# Patient Record
Sex: Female | Born: 1950 | Race: Black or African American | Hispanic: No | Smoking: Former smoker
Health system: Southern US, Community
[De-identification: ages and names within clinical notes are randomized; demographics above are authoritative.]

## PROBLEM LIST (undated history)

## (undated) DIAGNOSIS — R600 Localized edema: Secondary | ICD-10-CM

## (undated) DIAGNOSIS — E119 Type 2 diabetes mellitus without complications: Secondary | ICD-10-CM

## (undated) DIAGNOSIS — M1990 Unspecified osteoarthritis, unspecified site: Secondary | ICD-10-CM

## (undated) DIAGNOSIS — E785 Hyperlipidemia, unspecified: Secondary | ICD-10-CM

## (undated) DIAGNOSIS — M5030 Other cervical disc degeneration, unspecified cervical region: Secondary | ICD-10-CM

## (undated) DIAGNOSIS — G4700 Insomnia, unspecified: Secondary | ICD-10-CM

## (undated) DIAGNOSIS — I890 Lymphedema, not elsewhere classified: Secondary | ICD-10-CM

## (undated) DIAGNOSIS — I5030 Unspecified diastolic (congestive) heart failure: Secondary | ICD-10-CM

## (undated) DIAGNOSIS — F32A Depression, unspecified: Secondary | ICD-10-CM

## (undated) DIAGNOSIS — Z9181 History of falling: Secondary | ICD-10-CM

## (undated) DIAGNOSIS — E559 Vitamin D deficiency, unspecified: Secondary | ICD-10-CM

## (undated) DIAGNOSIS — M2012 Hallux valgus (acquired), left foot: Secondary | ICD-10-CM

## (undated) DIAGNOSIS — Z961 Presence of intraocular lens: Secondary | ICD-10-CM

## (undated) DIAGNOSIS — M2011 Hallux valgus (acquired), right foot: Secondary | ICD-10-CM

## (undated) DIAGNOSIS — R0789 Other chest pain: Secondary | ICD-10-CM

## (undated) DIAGNOSIS — N186 End stage renal disease: Secondary | ICD-10-CM

## (undated) DIAGNOSIS — Z862 Personal history of diseases of the blood and blood-forming organs and certain disorders involving the immune mechanism: Secondary | ICD-10-CM

## (undated) DIAGNOSIS — R0609 Other forms of dyspnea: Secondary | ICD-10-CM

## (undated) DIAGNOSIS — N289 Disorder of kidney and ureter, unspecified: Secondary | ICD-10-CM

## (undated) DIAGNOSIS — N2581 Secondary hyperparathyroidism of renal origin: Secondary | ICD-10-CM

## (undated) DIAGNOSIS — Z7901 Long term (current) use of anticoagulants: Secondary | ICD-10-CM

## (undated) DIAGNOSIS — J189 Pneumonia, unspecified organism: Secondary | ICD-10-CM

## (undated) DIAGNOSIS — R760 Raised antibody titer: Secondary | ICD-10-CM

## (undated) DIAGNOSIS — D869 Sarcoidosis, unspecified: Secondary | ICD-10-CM

## (undated) DIAGNOSIS — G479 Sleep disorder, unspecified: Secondary | ICD-10-CM

## (undated) DIAGNOSIS — G5712 Meralgia paresthetica, left lower limb: Secondary | ICD-10-CM

## (undated) DIAGNOSIS — R531 Weakness: Secondary | ICD-10-CM

## (undated) DIAGNOSIS — I10 Essential (primary) hypertension: Secondary | ICD-10-CM

## (undated) DIAGNOSIS — D6851 Activated protein C resistance: Secondary | ICD-10-CM

## (undated) DIAGNOSIS — I69322 Dysarthria following cerebral infarction: Secondary | ICD-10-CM

## (undated) DIAGNOSIS — D649 Anemia, unspecified: Secondary | ICD-10-CM

## (undated) DIAGNOSIS — G4733 Obstructive sleep apnea (adult) (pediatric): Secondary | ICD-10-CM

## (undated) DIAGNOSIS — K219 Gastro-esophageal reflux disease without esophagitis: Secondary | ICD-10-CM

## (undated) DIAGNOSIS — N189 Chronic kidney disease, unspecified: Secondary | ICD-10-CM

## (undated) DIAGNOSIS — D631 Anemia in chronic kidney disease: Secondary | ICD-10-CM

## (undated) DIAGNOSIS — I639 Cerebral infarction, unspecified: Secondary | ICD-10-CM

## (undated) DIAGNOSIS — Z794 Long term (current) use of insulin: Secondary | ICD-10-CM

## (undated) DIAGNOSIS — Z992 Dependence on renal dialysis: Secondary | ICD-10-CM

## (undated) HISTORY — PX: FRACTURE SURGERY: SHX138

## (undated) HISTORY — DX: Sleep disorder, unspecified: G47.9

## (undated) HISTORY — DX: Raised antibody titer: R76.0

## (undated) HISTORY — DX: Activated protein C resistance: D68.51

## (undated) HISTORY — DX: Anemia, unspecified: D64.9

---

## 2008-08-30 DIAGNOSIS — Z9889 Other specified postprocedural states: Secondary | ICD-10-CM

## 2008-08-30 HISTORY — DX: Other specified postprocedural states: Z98.890

## 2011-02-27 DIAGNOSIS — I639 Cerebral infarction, unspecified: Secondary | ICD-10-CM

## 2011-02-27 HISTORY — DX: Cerebral infarction, unspecified: I63.9

## 2012-01-31 DIAGNOSIS — D869 Sarcoidosis, unspecified: Secondary | ICD-10-CM | POA: Insufficient documentation

## 2012-10-13 DIAGNOSIS — E119 Type 2 diabetes mellitus without complications: Secondary | ICD-10-CM | POA: Insufficient documentation

## 2014-11-21 DIAGNOSIS — E559 Vitamin D deficiency, unspecified: Secondary | ICD-10-CM | POA: Insufficient documentation

## 2014-12-10 DIAGNOSIS — E1159 Type 2 diabetes mellitus with other circulatory complications: Secondary | ICD-10-CM | POA: Insufficient documentation

## 2014-12-10 DIAGNOSIS — I152 Hypertension secondary to endocrine disorders: Secondary | ICD-10-CM | POA: Insufficient documentation

## 2014-12-10 DIAGNOSIS — Z9181 History of falling: Secondary | ICD-10-CM | POA: Insufficient documentation

## 2014-12-10 DIAGNOSIS — I10 Essential (primary) hypertension: Secondary | ICD-10-CM | POA: Insufficient documentation

## 2015-07-08 DIAGNOSIS — I890 Lymphedema, not elsewhere classified: Secondary | ICD-10-CM | POA: Insufficient documentation

## 2015-07-08 DIAGNOSIS — R600 Localized edema: Secondary | ICD-10-CM | POA: Insufficient documentation

## 2015-07-08 DIAGNOSIS — B351 Tinea unguium: Secondary | ICD-10-CM | POA: Insufficient documentation

## 2015-07-08 DIAGNOSIS — M2010 Hallux valgus (acquired), unspecified foot: Secondary | ICD-10-CM | POA: Insufficient documentation

## 2015-08-26 DIAGNOSIS — Z961 Presence of intraocular lens: Secondary | ICD-10-CM | POA: Insufficient documentation

## 2015-10-07 DIAGNOSIS — Z961 Presence of intraocular lens: Secondary | ICD-10-CM | POA: Insufficient documentation

## 2015-10-21 DIAGNOSIS — K8020 Calculus of gallbladder without cholecystitis without obstruction: Secondary | ICD-10-CM | POA: Insufficient documentation

## 2016-01-27 DIAGNOSIS — K219 Gastro-esophageal reflux disease without esophagitis: Secondary | ICD-10-CM | POA: Insufficient documentation

## 2016-03-12 DIAGNOSIS — R0609 Other forms of dyspnea: Secondary | ICD-10-CM | POA: Insufficient documentation

## 2016-05-04 DIAGNOSIS — Z8709 Personal history of other diseases of the respiratory system: Secondary | ICD-10-CM | POA: Insufficient documentation

## 2016-05-21 DIAGNOSIS — M170 Bilateral primary osteoarthritis of knee: Secondary | ICD-10-CM | POA: Insufficient documentation

## 2016-10-20 DIAGNOSIS — I6930 Unspecified sequelae of cerebral infarction: Secondary | ICD-10-CM | POA: Insufficient documentation

## 2016-10-20 DIAGNOSIS — E1169 Type 2 diabetes mellitus with other specified complication: Secondary | ICD-10-CM | POA: Insufficient documentation

## 2016-10-20 DIAGNOSIS — I5030 Unspecified diastolic (congestive) heart failure: Secondary | ICD-10-CM | POA: Insufficient documentation

## 2016-10-20 DIAGNOSIS — E119 Type 2 diabetes mellitus without complications: Secondary | ICD-10-CM | POA: Insufficient documentation

## 2017-07-14 DIAGNOSIS — E785 Hyperlipidemia, unspecified: Secondary | ICD-10-CM | POA: Insufficient documentation

## 2018-03-22 DIAGNOSIS — N183 Chronic kidney disease, stage 3 unspecified: Secondary | ICD-10-CM | POA: Insufficient documentation

## 2018-03-22 DIAGNOSIS — S82141D Displaced bicondylar fracture of right tibia, subsequent encounter for closed fracture with routine healing: Secondary | ICD-10-CM | POA: Insufficient documentation

## 2018-03-22 DIAGNOSIS — S42202A Unspecified fracture of upper end of left humerus, initial encounter for closed fracture: Secondary | ICD-10-CM | POA: Insufficient documentation

## 2018-05-11 DIAGNOSIS — Z4889 Encounter for other specified surgical aftercare: Secondary | ICD-10-CM | POA: Insufficient documentation

## 2018-05-11 DIAGNOSIS — B964 Proteus (mirabilis) (morganii) as the cause of diseases classified elsewhere: Secondary | ICD-10-CM | POA: Insufficient documentation

## 2018-06-29 DIAGNOSIS — N189 Chronic kidney disease, unspecified: Secondary | ICD-10-CM | POA: Insufficient documentation

## 2018-07-02 DIAGNOSIS — N179 Acute kidney failure, unspecified: Secondary | ICD-10-CM | POA: Insufficient documentation

## 2018-07-18 DIAGNOSIS — Z8669 Personal history of other diseases of the nervous system and sense organs: Secondary | ICD-10-CM | POA: Insufficient documentation

## 2018-07-18 DIAGNOSIS — I639 Cerebral infarction, unspecified: Secondary | ICD-10-CM | POA: Insufficient documentation

## 2018-07-18 DIAGNOSIS — D6851 Activated protein C resistance: Secondary | ICD-10-CM

## 2018-07-18 DIAGNOSIS — R760 Raised antibody titer: Secondary | ICD-10-CM

## 2018-07-18 DIAGNOSIS — R7983 Abnormal findings of blood amino-acid level: Secondary | ICD-10-CM

## 2018-07-18 HISTORY — DX: Raised antibody titer: R76.0

## 2018-07-18 HISTORY — DX: Activated protein C resistance: D68.51

## 2018-07-18 HISTORY — DX: Abnormal findings of blood amino-acid level: R79.83

## 2018-08-16 DIAGNOSIS — D6851 Activated protein C resistance: Secondary | ICD-10-CM | POA: Insufficient documentation

## 2018-08-16 DIAGNOSIS — R760 Raised antibody titer: Secondary | ICD-10-CM | POA: Insufficient documentation

## 2018-08-16 DIAGNOSIS — R7989 Other specified abnormal findings of blood chemistry: Secondary | ICD-10-CM | POA: Insufficient documentation

## 2018-09-19 DIAGNOSIS — A419 Sepsis, unspecified organism: Secondary | ICD-10-CM | POA: Insufficient documentation

## 2018-09-19 DIAGNOSIS — D649 Anemia, unspecified: Secondary | ICD-10-CM | POA: Insufficient documentation

## 2018-09-19 DIAGNOSIS — D638 Anemia in other chronic diseases classified elsewhere: Secondary | ICD-10-CM | POA: Insufficient documentation

## 2018-09-19 DIAGNOSIS — L03116 Cellulitis of left lower limb: Secondary | ICD-10-CM | POA: Insufficient documentation

## 2019-09-10 DIAGNOSIS — R0789 Other chest pain: Secondary | ICD-10-CM | POA: Insufficient documentation

## 2020-01-04 DIAGNOSIS — I890 Lymphedema, not elsewhere classified: Secondary | ICD-10-CM | POA: Insufficient documentation

## 2020-01-04 DIAGNOSIS — L97921 Non-pressure chronic ulcer of unspecified part of left lower leg limited to breakdown of skin: Secondary | ICD-10-CM | POA: Insufficient documentation

## 2020-01-04 DIAGNOSIS — L97421 Non-pressure chronic ulcer of left heel and midfoot limited to breakdown of skin: Secondary | ICD-10-CM | POA: Insufficient documentation

## 2020-06-11 DIAGNOSIS — N390 Urinary tract infection, site not specified: Secondary | ICD-10-CM | POA: Insufficient documentation

## 2020-06-11 DIAGNOSIS — Z8673 Personal history of transient ischemic attack (TIA), and cerebral infarction without residual deficits: Secondary | ICD-10-CM | POA: Insufficient documentation

## 2020-06-11 DIAGNOSIS — E119 Type 2 diabetes mellitus without complications: Secondary | ICD-10-CM | POA: Insufficient documentation

## 2020-06-11 DIAGNOSIS — E1165 Type 2 diabetes mellitus with hyperglycemia: Secondary | ICD-10-CM | POA: Insufficient documentation

## 2020-09-25 DIAGNOSIS — Z1624 Resistance to multiple antibiotics: Secondary | ICD-10-CM | POA: Insufficient documentation

## 2020-10-02 DIAGNOSIS — M6281 Muscle weakness (generalized): Secondary | ICD-10-CM | POA: Insufficient documentation

## 2020-10-02 DIAGNOSIS — D6851 Activated protein C resistance: Secondary | ICD-10-CM | POA: Insufficient documentation

## 2020-10-02 DIAGNOSIS — G609 Hereditary and idiopathic neuropathy, unspecified: Secondary | ICD-10-CM | POA: Insufficient documentation

## 2020-10-02 DIAGNOSIS — R531 Weakness: Secondary | ICD-10-CM | POA: Insufficient documentation

## 2020-10-02 DIAGNOSIS — R269 Unspecified abnormalities of gait and mobility: Secondary | ICD-10-CM | POA: Insufficient documentation

## 2020-10-02 DIAGNOSIS — Z1624 Resistance to multiple antibiotics: Secondary | ICD-10-CM | POA: Insufficient documentation

## 2020-10-02 DIAGNOSIS — Z9181 History of falling: Secondary | ICD-10-CM | POA: Insufficient documentation

## 2020-10-02 DIAGNOSIS — R279 Unspecified lack of coordination: Secondary | ICD-10-CM | POA: Insufficient documentation

## 2020-10-02 DIAGNOSIS — G4700 Insomnia, unspecified: Secondary | ICD-10-CM | POA: Insufficient documentation

## 2020-10-02 DIAGNOSIS — D631 Anemia in chronic kidney disease: Secondary | ICD-10-CM | POA: Insufficient documentation

## 2020-10-02 DIAGNOSIS — N189 Chronic kidney disease, unspecified: Secondary | ICD-10-CM | POA: Insufficient documentation

## 2021-01-31 DIAGNOSIS — I5030 Unspecified diastolic (congestive) heart failure: Secondary | ICD-10-CM | POA: Insufficient documentation

## 2021-02-27 ENCOUNTER — Other Ambulatory Visit: Payer: Self-pay

## 2021-02-27 ENCOUNTER — Emergency Department: Payer: Medicare Other

## 2021-02-27 ENCOUNTER — Emergency Department
Admission: EM | Admit: 2021-02-27 | Discharge: 2021-02-27 | Disposition: A | Discharge status: Home / Self Care | Payer: Medicare Other | Attending: Emergency Medicine | Admitting: Emergency Medicine

## 2021-02-27 DIAGNOSIS — E119 Type 2 diabetes mellitus without complications: Secondary | ICD-10-CM | POA: Insufficient documentation

## 2021-02-27 DIAGNOSIS — I10 Essential (primary) hypertension: Secondary | ICD-10-CM | POA: Diagnosis not present

## 2021-02-27 DIAGNOSIS — Z7901 Long term (current) use of anticoagulants: Secondary | ICD-10-CM | POA: Insufficient documentation

## 2021-02-27 DIAGNOSIS — Z79899 Other long term (current) drug therapy: Secondary | ICD-10-CM | POA: Diagnosis not present

## 2021-02-27 DIAGNOSIS — Z794 Long term (current) use of insulin: Secondary | ICD-10-CM | POA: Diagnosis not present

## 2021-02-27 DIAGNOSIS — R0789 Other chest pain: Secondary | ICD-10-CM | POA: Diagnosis present

## 2021-02-27 HISTORY — DX: Disorder of kidney and ureter, unspecified: N28.9

## 2021-02-27 HISTORY — DX: Type 2 diabetes mellitus without complications: E11.9

## 2021-02-27 HISTORY — DX: Essential (primary) hypertension: I10

## 2021-02-27 LAB — BASIC METABOLIC PANEL
Anion gap: 8 (ref 5–15)
BUN: 60 mg/dL — ABNORMAL HIGH (ref 8–23)
CO2: 19 mmol/L — ABNORMAL LOW (ref 22–32)
Calcium: 8.4 mg/dL — ABNORMAL LOW (ref 8.9–10.3)
Chloride: 113 mmol/L — ABNORMAL HIGH (ref 98–111)
Creatinine, Ser: 2.97 mg/dL — ABNORMAL HIGH (ref 0.44–1.00)
GFR, Estimated: 17 mL/min — ABNORMAL LOW (ref >60)
Glucose, Bld: 101 mg/dL — ABNORMAL HIGH (ref 70–99)
Potassium: 5.1 mmol/L (ref 3.5–5.1)
Sodium: 140 mmol/L (ref 135–145)

## 2021-02-27 LAB — CBC
HCT: 26.4 % — ABNORMAL LOW (ref 36.0–46.0)
Hemoglobin: 8.6 g/dL — ABNORMAL LOW (ref 12.0–15.0)
MCH: 31 pg (ref 26.0–34.0)
MCHC: 32.6 g/dL (ref 30.0–36.0)
MCV: 95.3 fL (ref 80.0–100.0)
Platelets: 177 10*3/uL (ref 150–400)
RBC: 2.77 MIL/uL — ABNORMAL LOW (ref 3.87–5.11)
RDW: 13.6 % (ref 11.5–15.5)
WBC: 5.7 10*3/uL (ref 4.0–10.5)
nRBC: 0 % (ref 0.0–0.2)

## 2021-02-27 LAB — TROPONIN I (HIGH SENSITIVITY)
Troponin I (High Sensitivity): 5 ng/L (ref <18)
Troponin I (High Sensitivity): 6 ng/L (ref <18)

## 2021-02-27 MED ORDER — ALUM & MAG HYDROXIDE-SIMETH 200-200-20 MG/5ML PO SUSP
30.0000 mL | Freq: Once | ORAL | Status: AC
  Administered 2021-02-27: 30 mL via ORAL
  Filled 2021-02-27: qty 30

## 2021-02-27 MED ORDER — LIDOCAINE VISCOUS HCL 2 % MT SOLN
15.0000 mL | Freq: Once | OROMUCOSAL | Status: AC
  Administered 2021-02-27: 15 mL via ORAL
  Filled 2021-02-27: qty 15

## 2021-02-27 NOTE — ED Triage Notes (Signed)
Pt to ER via [facility] from [facility] with complaints of midsternal chest pain that radiates into back, started approx 1hr ago when laying in bed. Denies SHOB. Denies cardiac history.  Ems reports patient received x2 sublingual nitro with no relief. +324 aspirin.  EMS VSS-12 lead EKG NSR. BP 189/86, RA sats 97%. HR 76.

## 2021-02-27 NOTE — ED Notes (Signed)
ED Provider at bedside. 

## 2021-02-27 NOTE — ED Notes (Signed)
RN attempted to call report to [facility]. Was transferred to multiple people and then got disconnected./

## 2021-02-27 NOTE — ED Notes (Signed)
Offgoing RN called lab to recollect trop for pt due to hard blood draw

## 2021-02-27 NOTE — ED Provider Notes (Signed)
[facility] Emergency Department Provider Note   ____________________________________________    I have reviewed the triage vital signs and the nursing notes.   HISTORY  Chief Complaint Chest Pain     HPI Jody Nelson is a 70 y.o. female with history of diabetes and high blood pressure who presents with complaints of chest pain.  Patient describes left anterior chest pain beneath her breast, denies radiation to me. started approximately 1 hour prior to arrival.  No history of MI.  No difficulty breathing.  No fevers chills or cough.  No nausea or vomiting or diaphoresis.  Received aspirin and nitroglycerin from EMS with no improvement  Past Medical History:  Diagnosis Date   Diabetes mellitus without complication ([place])    Hypertension    Renal disorder     There are no problems to display for this patient.   History reviewed. No pertinent surgical history.  Prior to Admission medications   Medication Sig Start Date End Date Taking? Authorizing Provider  acetaminophen (TYLENOL) 325 MG tablet Take 2 tablets by mouth every 8 (eight) hours as needed.   Yes [provider]  apixaban (ELIQUIS) 5 MG TABS tablet Take 1 tablet by mouth every 12 (twelve) hours. 08/16/18  Yes [provider]  atorvastatin (LIPITOR) 80 MG tablet Take 80 mg by mouth daily. 02/19/21  Yes [provider]  Calcium Carb-Cholecalciferol (OYSTER SHELL CALCIUM W/D) 500-5 MG-MCG TABS Take 1 tablet by mouth daily as needed. 02/19/21  Yes [provider]  carvedilol (COREG) 25 MG tablet Take 1 tablet by mouth 2 (two) times daily. 02/19/21  Yes [provider]  cephALEXin (KEFLEX) 500 MG capsule Take 1 capsule by mouth every 12 (twelve) hours. 02/17/21 03/19/21 Yes [provider]  doxycycline (MONODOX) 100 MG capsule Take 1 capsule by mouth every 12 (twelve) hours. 02/18/21  Yes [provider]  ferrous sulfate 324 (65 Fe) MG  TBEC Take 1 tablet by mouth daily. 02/19/21  Yes [provider]  folic acid (FOLVITE) 1 MG tablet Take 1 mg by mouth daily. 02/19/21  Yes [provider]  furosemide (LASIX) 40 MG tablet Take 40 mg by mouth daily. 02/19/21  Yes [provider]  gabapentin (NEURONTIN) 100 MG capsule Take 2 capsules by mouth 2 (two) times daily. 02/19/21  Yes [provider]  insulin glargine (LANTUS) 100 UNIT/ML Solostar Pen Inject 10 Units into the skin at bedtime. 02/17/21  Yes [provider]  insulin lispro (HUMALOG) 100 UNIT/ML injection Inject 2-12 Units into the skin in the morning, at noon, and at bedtime. Per sliding scale   Yes [provider]  melatonin 3 MG TABS tablet Take 2 tablets by mouth at bedtime. 03/16/18  Yes [provider]  montelukast (SINGULAIR) 10 MG tablet Take 10 mg by mouth daily. 02/19/21  Yes [provider]  traZODone (DESYREL) 50 MG tablet Take 1 tablet by mouth at bedtime as needed. 08/22/20  Yes [provider]     Allergies Sulfamethoxazole-trimethoprim  No family history on file.  Social History    Review of Systems  Constitutional: No fever/chills Eyes: No visual changes.  ENT: No sore throat. Cardiovascular: Denies chest pain. Respiratory: Denies shortness of breath. Gastrointestinal: No abdominal pain.  No nausea, no vomiting.   Genitourinary: Negative for dysuria. Musculoskeletal: Negative for back pain. Skin: Negative for rash. Neurological: Negative for headaches or weakness   ____________________________________________   PHYSICAL EXAM:  VITAL SIGNS: ED Triage Vitals  Enc Vitals Group     BP 02/27/21 0819 (!) 163/83     Pulse Rate 02/27/21 0819 67     Resp 02/27/21 0819 20     Temp 02/27/21 0819 97.9 F (36.6 C)     Temp Source 02/27/21 0819 Oral     SpO2 02/27/21 0819 95 %     Weight 02/27/21 0820 (!) 149.7 kg (330 lb)     Height 02/27/21 0820 1.626 m (5\' 4" )      Head Circumference --      Peak Flow --      Pain Score 02/27/21 0819 10     Pain Loc --      Pain Edu? --      Excl. in [place]? --     Constitutional: Alert and oriented.  Eyes: Conjunctivae are normal.  Head: Atraumatic. Nose: No congestion/rhinnorhea.  Cardiovascular: Normal rate, regular rhythm. Grossly normal heart sounds.  Good peripheral circulation. Respiratory: Normal respiratory effort.  No retractions. Lungs CTAB. Gastrointestinal: Soft and nontender. No distention.    Musculoskeletal: No lower extremity tenderness nor edema.  Warm and well perfused Neurologic:  Normal speech and language. No gross focal neurologic deficits are appreciated.  Skin:  Skin is warm, dry and intact. No rash noted. Psychiatric: Mood and affect are normal. Speech and behavior are normal.  ____________________________________________   LABS (all labs ordered are listed, but only abnormal results are displayed)  Labs Reviewed  BASIC METABOLIC PANEL - Abnormal; Notable for the following components:      Result Value   Chloride 113 (*)    CO2 19 (*)    Glucose, Bld 101 (*)    BUN 60 (*)    Creatinine, Ser 2.97 (*)    Calcium 8.4 (*)    GFR, Estimated 17 (*)    All other components within normal limits  CBC - Abnormal; Notable for the following components:   RBC 2.77 (*)    Hemoglobin 8.6 (*)    HCT 26.4 (*)    All other components within normal limits  TROPONIN I (HIGH SENSITIVITY)  TROPONIN I (HIGH SENSITIVITY)   ____________________________________________  EKG  ED ECG REPORT I, Lavonia Drafts, the attending physician, personally viewed and interpreted this ECG.  Date: 02/27/2021  Rhythm: normal sinus rhythm QRS Axis: normal Intervals: normal ST/T Wave abnormalities: normal Narrative Interpretation: no evidence of acute ischemia  ____________________________________________  RADIOLOGY  Chest x-ray viewed by me, infiltrates noted, likely chronic  fibrosis ____________________________________________   PROCEDURES  Procedure(s) performed: No  Procedures   Critical Care performed: No ____________________________________________   INITIAL IMPRESSION / ASSESSMENT AND PLAN / ED COURSE  Pertinent labs & imaging results that were available during my care of the patient were reviewed by me and considered in my medical decision making (see chart for details).   Patient presents with chest pain as detailed above, initial EKG is reassuring, pending high-sensitivity troponin.  Differential includes ACS, GERD/esophagitis, not c/w aortic disease, no shortness of breath to suggest PE or pneumonia  Lab work reviewed, patient with chronic kidney disease, creatinine not significantly changed from prior levels according to [facility] records.  High-sensitivity troponin negative x2  Chest x-ray unchanged from prior imaging  Pain is improved, question possible GI cause, GI cocktail given.  After GI cocktail, patient's pain resolved  She remains well-appearing, appropriate discharge at this time with close follow-up with cardiology, return precautions discussed    ____________________________________________   FINAL CLINICAL IMPRESSION(S) / ED DIAGNOSES  Final  diagnoses:  Atypical chest pain        Note:  This document was prepared using Dragon voice recognition software and may include unintentional dictation errors.    Lavonia Drafts, MD 02/27/21 1356

## 2021-02-27 NOTE — ED Notes (Signed)
Verbal consent top DC. Signature pad not working.

## 2021-02-27 NOTE — ED Notes (Signed)
[facility]  CALLED  FOR  TRANSPORT  TO  [facility] 

## 2021-08-04 ENCOUNTER — Emergency Department: Payer: Medicare Other

## 2021-08-04 ENCOUNTER — Emergency Department
Admission: EM | Admit: 2021-08-04 | Discharge: 2021-08-04 | Disposition: A | Discharge status: Home / Self Care | Payer: Medicare Other | Attending: Emergency Medicine | Admitting: Emergency Medicine

## 2021-08-04 ENCOUNTER — Other Ambulatory Visit: Payer: Self-pay

## 2021-08-04 DIAGNOSIS — W010XXA Fall on same level from slipping, tripping and stumbling without subsequent striking against object, initial encounter: Secondary | ICD-10-CM | POA: Insufficient documentation

## 2021-08-04 DIAGNOSIS — E119 Type 2 diabetes mellitus without complications: Secondary | ICD-10-CM | POA: Insufficient documentation

## 2021-08-04 DIAGNOSIS — W19XXXA Unspecified fall, initial encounter: Secondary | ICD-10-CM | POA: Insufficient documentation

## 2021-08-04 DIAGNOSIS — I10 Essential (primary) hypertension: Secondary | ICD-10-CM | POA: Insufficient documentation

## 2021-08-04 DIAGNOSIS — Y92009 Unspecified place in unspecified non-institutional (private) residence as the place of occurrence of the external cause: Secondary | ICD-10-CM | POA: Diagnosis not present

## 2021-08-04 DIAGNOSIS — M546 Pain in thoracic spine: Secondary | ICD-10-CM | POA: Diagnosis not present

## 2021-08-04 DIAGNOSIS — Z7901 Long term (current) use of anticoagulants: Secondary | ICD-10-CM | POA: Insufficient documentation

## 2021-08-04 MED ORDER — LIDOCAINE 5 % EX PTCH: 1.0000 | MEDICATED_PATCH | Freq: Two times a day (BID) | CUTANEOUS | 0 refills | Status: AC | PRN

## 2021-08-04 MED ORDER — LIDOCAINE 5 % EX PTCH
1.0000 | MEDICATED_PATCH | Freq: Once | CUTANEOUS | Status: DC
  Administered 2021-08-04: 1 via TRANSDERMAL
  Filled 2021-08-04: qty 1

## 2021-08-04 MED ORDER — HYDROCODONE-ACETAMINOPHEN 5-325 MG PO TABS
1.0000 | ORAL_TABLET | Freq: Once | ORAL | Status: AC
  Administered 2021-08-04: 1 via ORAL
  Filled 2021-08-04: qty 1

## 2021-08-04 MED ORDER — HYDROCODONE-ACETAMINOPHEN 5-325 MG PO TABS: 1.0000 | ORAL_TABLET | Freq: Three times a day (TID) | ORAL | 0 refills | Status: AC | PRN

## 2021-08-04 NOTE — ED Notes (Signed)
Pt complains of upper back pain that has worsened in the last 2 weeks  ? ?

## 2021-08-04 NOTE — ED Triage Notes (Signed)
Pt fell on March 5th and has been experiencing back pian since. The facility has attempted to do xrays and send pt to doctor for xrays but both plans fell through. They sent pt today in hopes of getting xrays for her upper back pian.  ?

## 2021-08-04 NOTE — ED Provider Notes (Signed)
? ? ?[facility] ?Emergency Department Provider Note ? ? ? ? Event Date/Time  ? First MD Initiated Contact with Patient 08/04/21 1107   ?  (approximate) ? ? ?History  ? ?Back Pain ? ? ?HPI ? ?Jody Nelson is a 71 y.o. female with a history of hypertension, diabetes, and kidney disease who is on Eliquis, presents to the ED from her facility.  She describes a mechanical fall that occurred on March 5 while she was trying to transfer from her wheelchair to her bed.  She denies any head injury or LOC patient reports falling onto her knees, and then tipping backwards onto her mid back and shoulders.  She reports delayed onset of pain to the midline thoracic spine.  She denies any head injury or LOC.  She also denies any chest pain, distal paresthesias, or shortness of breath. ?  ? ? ?Physical Exam  ? ?Triage Vital Signs: ?ED Triage Vitals  ?Enc Vitals Group  ?   BP 08/04/21 1111 (!) 162/59  ?   Pulse Rate 08/04/21 1111 71  ?   Resp 08/04/21 1111 17  ?   Temp 08/04/21 1111 97.9 ?F (36.6 ?C)  ?   Temp Source 08/04/21 1111 Oral  ?   SpO2 08/04/21 1111 99 %  ?   Weight 08/04/21 1114 (!) 312 lb (141.5 kg)  ?   Height 08/04/21 1114 5\' 4"  (1.626 m)  ?   Head Circumference --   ?   Peak Flow --   ?   Pain Score 08/04/21 1114 8  ?   Pain Loc --   ?   Pain Edu? --   ?   Excl. in [place]? --   ? ? ?Most recent vital signs: ?Vitals:  ? 08/04/21 1650 08/04/21 1705  ?BP: (!) 150/60 (!) 158/60  ?Pulse: 76 70  ?Resp: 16 16  ?Temp:    ?SpO2: 99%   ? ? ?General Awake, no distress.  ?CV:  Good peripheral perfusion.  ?RESP:  Normal effort. CTA ?ABD:  No distention. soft ?MSK:  Tender to palpation in the midline T-spine.  No spinal deformity or dislocation appreciated.  Full active range of motion of the upper extremities. ? ? ?ED Results / Procedures / Treatments  ? ?Labs ?(all labs ordered are listed, but only abnormal results are displayed) ?Labs Reviewed - No data to display ? ? ?EKG ? ? ?RADIOLOGY ? ?I personally viewed  and evaluated these images as part of my medical decision making, as well as reviewing the written report by the radiologist. ? ?ED Provider Interpretation: no acute findings} ? ?DG Thoracic Spine 2 View ? ?Result Date: 08/04/2021 ?CLINICAL DATA:  Status post fall three weeks ago. Pain. Nonambulatory. EXAM: THORACIC SPINE 2 VIEWS COMPARISON:  Chest radiograph from 02/27/2021 FINDINGS: Mild curvature of the thoracic spine is convex towards the right. There is multilevel disc space narrowing and endplate spurring identified compatible with degenerative disc disease. There are no signs of acute fracture. IMPRESSION: 1. No acute findings. 2. Mild scoliosis and multilevel degenerative disc disease. Electronically Signed   By: Kerby Moors M.D.   On: 08/04/2021 12:56   ? ? ?PROCEDURES: ? ?Critical Care performed: No ? ?Procedures ? ? ?MEDICATIONS ORDERED IN ED: ?Medications  ?lidocaine (LIDODERM) 5 % 1 patch (1 patch Transdermal Patch Applied 08/04/21 1343)  ?HYDROcodone-acetaminophen (NORCO/VICODIN) 5-325 MG per tablet 1 tablet (1 tablet Oral Given 08/04/21 1343)  ? ? ? ?IMPRESSION / MDM / ASSESSMENT AND  PLAN / ED COURSE  ?I reviewed the triage vital signs and the nursing notes. ?             ?               ? ?Differential diagnosis includes, but is not limited to, thoracic spine strain, T-spine fracture, back contusino ? ?Geriatric patient to the ED for radiologic evaluation of mid back pain after mechanical fall approximately 2 and half weeks earlier.  Patient reports a mechanical fall at her facility where she landed initially on her knees, and then landed backwards on her mid back.  She denies any head injury or LOC.  She also denies any ongoing acute knee pain.  She is evaluated for complaints here after her facility was unable to secure outpatient imaging for her.  Patient's plain films of the T-spine are negative for any acute fracture or dislocation.  Patient clinical picture is reassuring as it shows no acute  deficits.  Patient's diagnosis is consistent with T-spine contusion and back pain. Patient will be discharged home with prescriptions for Lidoderm patches and hydrocodone. Patient is to follow up with her primary provider as needed or otherwise directed. Patient is given ED precautions to return to the ED for any worsening or new symptoms. ? ? ? ?FINAL CLINICAL IMPRESSION(S) / ED DIAGNOSES  ? ?Final diagnoses:  ?Fall at home, initial encounter  ?Acute midline thoracic back pain  ? ? ? ?Rx / DC Orders  ? ?ED Discharge Orders   ? ?      Ordered  ?  lidocaine (LIDODERM) 5 %  Every 12 hours PRN       ? 08/04/21 1311  ?  HYDROcodone-acetaminophen (NORCO) 5-325 MG tablet  3 times daily PRN       ? 08/04/21 1311  ? ?  ?  ? ?  ? ? ? ?Note:  This document was prepared using Dragon voice recognition software and may include unintentional dictation errors. ? ?  ?Cristin Penaflor, Dannielle Karvonen, PA-C ?08/04/21 2002 ? ?  ?Rada Hay, MD ?08/05/21 [phone] ? ?

## 2021-08-04 NOTE — Discharge Instructions (Signed)
There is no XR evidence of fracture or dislocation to the midback.  Take the pain medicine as needed and use the pain patches as prescribed.  Follow-up with your primary provider for ongoing symptoms. ?

## 2021-08-04 NOTE — ED Notes (Signed)
Pt given dinner tray.

## 2021-09-16 ENCOUNTER — Other Ambulatory Visit: Payer: Self-pay | Admitting: Nephrology

## 2021-09-16 DIAGNOSIS — E1122 Type 2 diabetes mellitus with diabetic chronic kidney disease: Secondary | ICD-10-CM

## 2021-09-16 DIAGNOSIS — N184 Chronic kidney disease, stage 4 (severe): Secondary | ICD-10-CM

## 2021-10-07 ENCOUNTER — Ambulatory Visit: Payer: Medicare Other

## 2021-10-07 ENCOUNTER — Telehealth (INDEPENDENT_AMBULATORY_CARE_PROVIDER_SITE_OTHER): Payer: Self-pay

## 2021-10-07 NOTE — Telephone Encounter (Signed)
I attempted to contact the patient and the number that was listed was a business number. I contacted the patient's daughter and was given the patient's mobile number which I put into the system. I attempted to contact to schedule the patient for a permcath insertion. A message was left for a return call.

## 2021-10-08 NOTE — Telephone Encounter (Signed)
Davetta from [facility] called to get the patient scheduled for a permcath insertion. Patient is scheduled with Dr. Lucky Cowboy on 10/14/21 with a  8:00 am arrival time to the MM. Pre-procedure instructions were faxed to attention Davetta at [facility].

## 2021-10-14 ENCOUNTER — Encounter
Admission: RE | Disposition: A | Discharge status: Home / Self Care | Payer: Self-pay | Source: Home / Self Care | Attending: Vascular Surgery

## 2021-10-14 ENCOUNTER — Other Ambulatory Visit: Payer: Self-pay

## 2021-10-14 ENCOUNTER — Ambulatory Visit
Admission: RE | Admit: 2021-10-14 | Discharge: 2021-10-14 | Disposition: A | Discharge status: Home / Self Care | Payer: Medicare Other | Attending: Vascular Surgery | Admitting: Vascular Surgery

## 2021-10-14 ENCOUNTER — Encounter: Payer: Self-pay | Admitting: Vascular Surgery

## 2021-10-14 DIAGNOSIS — Z992 Dependence on renal dialysis: Secondary | ICD-10-CM | POA: Insufficient documentation

## 2021-10-14 DIAGNOSIS — I120 Hypertensive chronic kidney disease with stage 5 chronic kidney disease or end stage renal disease: Secondary | ICD-10-CM | POA: Insufficient documentation

## 2021-10-14 DIAGNOSIS — Z87891 Personal history of nicotine dependence: Secondary | ICD-10-CM | POA: Insufficient documentation

## 2021-10-14 DIAGNOSIS — I2510 Atherosclerotic heart disease of native coronary artery without angina pectoris: Secondary | ICD-10-CM | POA: Insufficient documentation

## 2021-10-14 DIAGNOSIS — E1122 Type 2 diabetes mellitus with diabetic chronic kidney disease: Secondary | ICD-10-CM | POA: Diagnosis not present

## 2021-10-14 DIAGNOSIS — N186 End stage renal disease: Secondary | ICD-10-CM | POA: Insufficient documentation

## 2021-10-14 HISTORY — DX: Cerebral infarction, unspecified: I63.9

## 2021-10-14 HISTORY — PX: DIALYSIS/PERMA CATHETER INSERTION: CATH118288

## 2021-10-14 LAB — POTASSIUM (ARMC VASCULAR LAB ONLY): Potassium (ARMC vascular lab): 4.4 mmol/L (ref 3.5–5.1)

## 2021-10-14 LAB — GLUCOSE, CAPILLARY
Glucose-Capillary: 126 mg/dL — ABNORMAL HIGH (ref 70–99)
Glucose-Capillary: 111 mg/dL — ABNORMAL HIGH (ref 70–99)

## 2021-10-14 SURGERY — DIALYSIS/PERMA CATHETER INSERTION
Anesthesia: Moderate Sedation

## 2021-10-14 MED ORDER — SODIUM CHLORIDE 0.9 % IV SOLN: INTRAVENOUS | Status: DC

## 2021-10-14 MED ORDER — CEFAZOLIN SODIUM-DEXTROSE 1-4 GM/50ML-% IV SOLN: 1.0000 g | INTRAVENOUS | Status: DC

## 2021-10-14 MED ORDER — FAMOTIDINE 20 MG PO TABS: 40.0000 mg | ORAL_TABLET | Freq: Once | ORAL | Status: DC | PRN

## 2021-10-14 MED ORDER — MIDAZOLAM HCL 2 MG/2ML IJ SOLN
INTRAMUSCULAR | Status: DC | PRN
  Administered 2021-10-14: 1 mg via INTRAVENOUS

## 2021-10-14 MED ORDER — CEFAZOLIN SODIUM-DEXTROSE 1-4 GM/50ML-% IV SOLN
INTRAVENOUS | Status: DC | PRN
  Administered 2021-10-14: 1 g via INTRAVENOUS

## 2021-10-14 MED ORDER — DIPHENHYDRAMINE HCL 50 MG/ML IJ SOLN: 50.0000 mg | Freq: Once | INTRAMUSCULAR | Status: DC | PRN

## 2021-10-14 MED ORDER — MIDAZOLAM HCL 5 MG/5ML IJ SOLN
INTRAMUSCULAR | Status: AC
  Filled 2021-10-14: qty 5

## 2021-10-14 MED ORDER — ONDANSETRON HCL 4 MG/2ML IJ SOLN: 4.0000 mg | Freq: Four times a day (QID) | INTRAMUSCULAR | Status: DC | PRN

## 2021-10-14 MED ORDER — FENTANYL CITRATE (PF) 100 MCG/2ML IJ SOLN
INTRAMUSCULAR | Status: DC | PRN
  Administered 2021-10-14: 50 ug via INTRAVENOUS

## 2021-10-14 MED ORDER — METHYLPREDNISOLONE SODIUM SUCC 125 MG IJ SOLR: 125.0000 mg | Freq: Once | INTRAMUSCULAR | Status: DC | PRN

## 2021-10-14 MED ORDER — MIDAZOLAM HCL 2 MG/ML PO SYRP: 8.0000 mg | ORAL_SOLUTION | Freq: Once | ORAL | Status: DC | PRN

## 2021-10-14 MED ORDER — HYDROMORPHONE HCL 1 MG/ML IJ SOLN: 1.0000 mg | Freq: Once | INTRAMUSCULAR | Status: DC | PRN

## 2021-10-14 MED ORDER — FENTANYL CITRATE PF 50 MCG/ML IJ SOSY
PREFILLED_SYRINGE | INTRAMUSCULAR | Status: AC
  Filled 2021-10-14: qty 2

## 2021-10-14 SURGICAL SUPPLY — 9 items
BIOPATCH RED 1 DISK 7.0 (GAUZE/BANDAGES/DRESSINGS) ×1 IMPLANT
CATH CANNON HEMO 15FR 19 (HEMODIALYSIS SUPPLIES) ×1 IMPLANT
COVER PROBE U/S 5X48 (MISCELLANEOUS) ×1 IMPLANT
DERMABOND ADVANCED (GAUZE/BANDAGES/DRESSINGS) ×1
DERMABOND ADVANCED .7 DNX12 (GAUZE/BANDAGES/DRESSINGS) IMPLANT
PACK ANGIOGRAPHY (CUSTOM PROCEDURE TRAY) ×1 IMPLANT
SUT MNCRL AB 4-0 PS2 18 (SUTURE) ×1 IMPLANT
SUT PROLENE 0 CT 1 30 (SUTURE) ×1 IMPLANT
TOWEL OR 17X26 4PK STRL BLUE (TOWEL DISPOSABLE) ×1 IMPLANT

## 2021-10-14 NOTE — H&P (Signed)
[facility] Admission History & Physical  MRN : [id number]  Jody Nelson is a 71 y.o. (12-29-1950) female who presents with chief complaint of No chief complaint on file. Marland Kitchen  History of Present Illness:  I am asked to evaluate the patient by the dialysis center. The patient needs a PermCath to begin dialysis.   Current Facility-Administered Medications  Medication Dose Route Frequency Provider Last Rate Last Admin   fentaNYL (SUBLIMAZE) 50 MCG/ML injection            midazolam (VERSED) 5 MG/5ML injection            0.9 %  sodium chloride infusion   Intravenous Continuous Kris Hartmann, NP 10 mL/hr at 10/14/21 0837 New Bag at 10/14/21 0837   ceFAZolin (ANCEF) IVPB 1 g/50 mL premix  1 g Intravenous 30 min Pre-Op Kris Hartmann, NP       diphenhydrAMINE (BENADRYL) injection 50 mg  50 mg Intravenous Once PRN Kris Hartmann, NP       famotidine (PEPCID) tablet 40 mg  40 mg Oral Once PRN Kris Hartmann, NP       HYDROmorphone (DILAUDID) injection 1 mg  1 mg Intravenous Once PRN Kris Hartmann, NP       methylPREDNISolone sodium succinate (SOLU-MEDROL) 125 mg/2 mL injection 125 mg  125 mg Intravenous Once PRN Kris Hartmann, NP       midazolam (VERSED) 2 MG/ML syrup 8 mg  8 mg Oral Once PRN Kris Hartmann, NP       ondansetron [facility]) injection 4 mg  4 mg Intravenous Q6H PRN Kris Hartmann, NP         Past Surgical History:  Procedure Laterality Date   CESAREAN SECTION       Social History   Tobacco Use   Smoking status: Former    Types: Cigarettes   Smokeless tobacco: Never  Substance Use Topics   Alcohol use: Never   Drug use: Never    Family History  No family history of bleeding or clotting disorders, autoimmune disease or porphyria  Allergies  Allergen Reactions   Sulfamethoxazole-Trimethoprim     Other reaction(s): Kidney Disorder Other reaction(s): Kidney Disorder Hyperkalemia, AKI Hyperkalemia, AKI      REVIEW OF SYSTEMS  (Negative unless checked)  Constitutional: [] Weight loss  [] Fever  [] Chills Cardiac: [] Chest pain   [] Chest pressure   [] Palpitations   [] Shortness of breath when laying flat   [] Shortness of breath at rest   [x] Shortness of breath with exertion. Vascular:  [] Pain in legs with walking   [] Pain in legs at rest   [] Pain in legs when laying flat   [] Claudication   [] Pain in feet when walking  [] Pain in feet at rest  [] Pain in feet when laying flat   [] History of DVT   [] Phlebitis   [x] Swelling in legs   [] Varicose veins   [] Non-healing ulcers Pulmonary:   [] Uses home oxygen   [] Productive cough   [] Hemoptysis   [] Wheeze  [] COPD   [] Asthma Neurologic:  [] Dizziness  [] Blackouts   [] Seizures   [] History of stroke   [] History of TIA  [] Aphasia   [] Temporary blindness   [] Dysphagia   [] Weakness or numbness in arms   [] Weakness or numbness in legs Musculoskeletal:  [x] Arthritis   [] Joint swelling   [x] Joint pain   [] Low back pain Hematologic:  [] Easy bruising  [] Easy bleeding   [x] Hypercoagulable state   [x] Anemic  [] Hepatitis Gastrointestinal:  []   Blood in stool   [] Vomiting blood  [] Gastroesophageal reflux/heartburn   [] Difficulty swallowing. Genitourinary:  [x] Chronic kidney disease   [] Difficult urination  [] Frequent urination  [] Burning with urination   [] Blood in urine Skin:  [] Rashes   [] Ulcers   [] Wounds Psychological:  [] History of anxiety   []  History of major depression.  Physical Examination  Vitals:   10/14/21 0821  BP: (!) 165/71  Pulse: 66  Resp: 18  Temp: (!) 97.5 F (36.4 C)  TempSrc: Oral  SpO2: 100%  Weight: (!) 144.2 kg  Height: 5\' 4"  (1.626 m)   Body mass index is 54.58 kg/m. Gen: WD/WN, NAD. Morbidly obese Head: [place]/AT, No temporalis wasting.  Ear/Nose/Throat: Hearing grossly intact, nares w/o erythema or drainage, oropharynx w/o Erythema/Exudate,  Eyes: Conjunctiva clear, sclera non-icteric Neck: Trachea midline.  No JVD.  Pulmonary:  Good air movement, respirations not  labored, no use of accessory muscles.  Cardiac: RRR, normal S1, S2. Vascular:  Vessel Right Left  Radial Palpable Palpable  Gastrointestinal: soft, non-tender/non-distended. No guarding/reflex.  Musculoskeletal: M/S 5/5 throughout.  Extremities without ischemic changes.  No deformity or atrophy.  Neurologic: Sensation grossly intact in extremities.  Symmetrical.  Speech is fluent. Motor exam as listed above. Psychiatric: Judgment intact, Mood & affect appropriate for pt's clinical situation. Dermatologic: No rashes or ulcers noted.  No cellulitis or open wounds.    CBC Lab Results  Component Value Date   WBC 5.7 02/27/2021   HGB 8.6 (L) 02/27/2021   HCT 26.4 (L) 02/27/2021   MCV 95.3 02/27/2021   PLT 177 02/27/2021    BMET    Component Value Date/Time   NA 140 02/27/2021 0831   K 5.1 02/27/2021 0831   CL 113 (H) 02/27/2021 0831   CO2 19 (L) 02/27/2021 0831   GLUCOSE 101 (H) 02/27/2021 0831   BUN 60 (H) 02/27/2021 0831   CREATININE 2.97 (H) 02/27/2021 0831   CALCIUM 8.4 (L) 02/27/2021 0831   GFRNONAA 17 (L) 02/27/2021 0831   CrCl cannot be calculated (Patient's most recent lab result is older than the maximum 21 days allowed.).  COAG No results found for: INR, PROTIME  Radiology No results found.  Assessment/Plan 1.   End-stage renal disease requiring hemodialysis:  Patient will continue dialysis therapy without further interruption when new site will be found for tunneled catheter placement. Dialysis has already been arranged since the patient missed their previous session 2.  Hypertension:  Patient will continue medical management; nephrology is following no changes in oral medications. 3. Diabetes mellitus:  Glucose will be monitored and oral medications been held this morning once the patient has undergone the patient's procedure po intake will be reinitiated and again Accu-Cheks will be used to assess the blood glucose level and treat as needed. The patient will be  restarted on the patient's usual hypoglycemic regime 4.  Coronary artery disease:  EKG will be monitored. Nitrates will be used if needed. The patient's oral cardiac medications will be continued.    Leotis Pain, MD  10/14/2021 8:56 AM

## 2021-10-14 NOTE — Op Note (Signed)
OPERATIVE NOTE    PRE-OPERATIVE DIAGNOSIS: 1. ESRD   POST-OPERATIVE DIAGNOSIS: same as above  PROCEDURE: Ultrasound guidance for vascular access to the right internal jugular vein Fluoroscopic guidance for placement of catheter Placement of a 19 cm tip to cuff tunneled hemodialysis catheter via the right internal jugular vein  SURGEON: Leotis Pain, MD  ANESTHESIA:  Local with Moderate conscious sedation for approximately 22 minutes using 1 mg of Versed and 50 mcg of Fentanyl  ESTIMATED BLOOD LOSS: 5 cc  FLUORO TIME: less than one minute  CONTRAST: none  FINDING(S): 1.  Patent right internal jugular vein  SPECIMEN(S):  None  INDICATIONS:   Jody Nelson is a 72 y.o.female who presents with renal failure.  The patient needs long term dialysis access for their ESRD, and a Permcath is necessary.  Risks and benefits are discussed and informed consent is obtained.    DESCRIPTION: After obtaining full informed written consent, the patient was brought back to the vascular suited. The patient's right neck and chest were sterilely prepped and draped in a sterile surgical field was created. Moderate conscious sedation was administered during a face to face encounter with the patient throughout the procedure with my supervision of the RN administering medicines and monitoring the patient's vital signs, pulse oximetry, telemetry and mental status throughout from the start of the procedure until the patient was taken to the recovery room.  The right internal jugular vein was visualized with ultrasound and found to be patent. It was then accessed under direct ultrasound guidance and a permanent image was recorded. A wire was placed. After skin nick and dilatation, the peel-away sheath was placed over the wire. I then turned my attention to an area under the clavicle. Approximately 1-2 fingerbreadths below the clavicle a small counterincision was created and tunneled from the subclavicular incision to  the access site. Using fluoroscopic guidance, a 19 centimeter tip to cuff tunneled hemodialysis catheter was selected, and tunneled from the subclavicular incision to the access site. It was then placed through the peel-away sheath and the peel-away sheath was removed. Using fluoroscopic guidance the catheter tips were parked in the right atrium. The appropriate distal connectors were placed. It withdrew blood well and flushed easily with heparinized saline and a concentrated heparin solution was then placed. It was secured to the chest wall with 2 Prolene sutures. The access incision was closed single 4-0 Monocryl. A 4-0 Monocryl pursestring suture was placed around the exit site. Sterile dressings were placed. The patient tolerated the procedure well and was taken to the recovery room in stable condition.  COMPLICATIONS: None  CONDITION: Stable  Leotis Pain, MD 10/14/2021 9:19 AM   This note was created with Dragon Medical transcription system. Any errors in dictation are purely unintentional.

## 2021-10-15 ENCOUNTER — Encounter: Payer: Self-pay | Admitting: Vascular Surgery

## 2021-10-20 ENCOUNTER — Inpatient Hospital Stay: Payer: Medicaid Other | Admitting: Oncology

## 2021-10-20 ENCOUNTER — Inpatient Hospital Stay: Payer: Medicaid Other

## 2021-10-27 ENCOUNTER — Telehealth: Payer: Self-pay | Admitting: Oncology

## 2021-10-27 ENCOUNTER — Encounter: Payer: Self-pay | Admitting: Oncology

## 2021-10-27 ENCOUNTER — Telehealth: Payer: Self-pay

## 2021-10-27 ENCOUNTER — Inpatient Hospital Stay: Payer: 59 | Attending: Oncology | Admitting: Oncology

## 2021-10-27 ENCOUNTER — Other Ambulatory Visit: Payer: Self-pay

## 2021-10-27 ENCOUNTER — Inpatient Hospital Stay: Payer: 59

## 2021-10-27 VITALS — BP 157/59 | HR 64 | Temp 98.0°F | Resp 18 | Ht 64.0 in | Wt 314.0 lb

## 2021-10-27 DIAGNOSIS — Z992 Dependence on renal dialysis: Secondary | ICD-10-CM | POA: Insufficient documentation

## 2021-10-27 DIAGNOSIS — I5032 Chronic diastolic (congestive) heart failure: Secondary | ICD-10-CM | POA: Insufficient documentation

## 2021-10-27 DIAGNOSIS — Z881 Allergy status to other antibiotic agents status: Secondary | ICD-10-CM | POA: Diagnosis not present

## 2021-10-27 DIAGNOSIS — R5383 Other fatigue: Secondary | ICD-10-CM | POA: Insufficient documentation

## 2021-10-27 DIAGNOSIS — D631 Anemia in chronic kidney disease: Secondary | ICD-10-CM

## 2021-10-27 DIAGNOSIS — I132 Hypertensive heart and chronic kidney disease with heart failure and with stage 5 chronic kidney disease, or end stage renal disease: Secondary | ICD-10-CM | POA: Insufficient documentation

## 2021-10-27 DIAGNOSIS — Z7901 Long term (current) use of anticoagulants: Secondary | ICD-10-CM | POA: Insufficient documentation

## 2021-10-27 DIAGNOSIS — E1122 Type 2 diabetes mellitus with diabetic chronic kidney disease: Secondary | ICD-10-CM | POA: Insufficient documentation

## 2021-10-27 DIAGNOSIS — Z8673 Personal history of transient ischemic attack (TIA), and cerebral infarction without residual deficits: Secondary | ICD-10-CM | POA: Insufficient documentation

## 2021-10-27 DIAGNOSIS — R0789 Other chest pain: Secondary | ICD-10-CM | POA: Insufficient documentation

## 2021-10-27 DIAGNOSIS — N185 Chronic kidney disease, stage 5: Secondary | ICD-10-CM | POA: Insufficient documentation

## 2021-10-27 DIAGNOSIS — Z6841 Body Mass Index (BMI) 40.0 and over, adult: Secondary | ICD-10-CM | POA: Diagnosis not present

## 2021-10-27 DIAGNOSIS — Z79899 Other long term (current) drug therapy: Secondary | ICD-10-CM | POA: Insufficient documentation

## 2021-10-27 NOTE — Telephone Encounter (Signed)
[facility] states that they can bring blood back to [place] and [facility] has agreed to draw blood. I faxed lab orders over to [facility].

## 2021-10-27 NOTE — Telephone Encounter (Signed)
Spoke to [facility] on [address] in [place]. Jody Nelson is a new patient there since 10/16/21 she is receiving IV mircera 17mcg every 2 weeks and IV venofer 50mg  every 1 week per social worker that I spoke with. The phone number this this [place] location is [phone].

## 2021-10-27 NOTE — Progress Notes (Signed)
Patient reports not sleeping well, constipation, cough for months, left heel soreness and tenderness.

## 2021-10-27 NOTE — Telephone Encounter (Signed)
Thank you :)

## 2021-10-27 NOTE — Progress Notes (Signed)
Hematology/Oncology Consult note [facility] [phone] [phone]  Patient Care Team: System, Provider Not In as PCP - General   Name of the patient: Jody Nelson  [id number]  05-Jul-1950    Reason for referral-anemia of chronic kidney disease   Referring physician-Dr. Holley Raring  Date of visit: 10/27/21   History of presenting illness- Patient is a 71 year old African-American female with a past medical history significant for hypertension type 2 diabetes, CKD stage V, history of CVA heart failure among other medical problems.  She has been referred to [place] for anemia.  Her most recent CBC when checked by nephrology showed an H&H of 8.3/26.2 with an MCV of 91 in May 2023.  White cell count and platelets were normal.  Creatinine elevated at 3.98.  Serum calcium and total protein normal we do not have any prior hemoglobins for comparison prior to May 2023.  Patient is a resident at the nursing home.  She started hemodialysis a few weeks ago.  ECOG PS- 3  Pain scale- 3   Review of systems- Review of Systems  Constitutional:  Positive for malaise/fatigue. Negative for chills, fever and weight loss.  HENT:  Negative for congestion, ear discharge and nosebleeds.   Eyes:  Negative for blurred vision.  Respiratory:  Negative for cough, hemoptysis, sputum production, shortness of breath and wheezing.   Cardiovascular:  Negative for chest pain, palpitations, orthopnea and claudication.  Gastrointestinal:  Negative for abdominal pain, blood in stool, constipation, diarrhea, heartburn, melena, nausea and vomiting.  Genitourinary:  Negative for dysuria, flank pain, frequency, hematuria and urgency.  Musculoskeletal:  Negative for back pain, joint pain and myalgias.  Skin:  Negative for rash.  Neurological:  Negative for dizziness, tingling, focal weakness, seizures, weakness and headaches.  Endo/Heme/Allergies:  Does not bruise/bleed easily.   Psychiatric/Behavioral:  Negative for depression and suicidal ideas. The patient does not have insomnia.     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 09/25/2020   H/O: CVA (cerebrovascular accident) 06/11/2020   Diabetes mellitus type 2, insulin dependent ([place]) 06/11/2020   Urinary tract infection 06/11/2020   Heel ulcer, left, limited to breakdown of skin ([place]) 01/04/2020   Leg ulcer, left, limited to breakdown of skin ([place]) 01/04/2020   Lymphedema, not elsewhere classified 01/04/2020   Chest pain, atypical 09/10/2019   Anemia, unspecified 09/19/2018   Cellulitis of left lower extremity 09/19/2018   Sepsis ([place]) 09/19/2018   Anticardiolipin antibody positive 08/16/2018   Elevated homocysteine 08/16/2018   Heterozygous factor V Leiden mutation ([place]) 08/16/2018   History of obstructive sleep apnea 07/18/2018   Ischemic stroke ([place]) 07/18/2018   Acute kidney injury superimposed on CKD ([place]) 06/29/2018   CKD (chronic kidney disease) stage 3, GFR 30-59 ml/min (HCC) 03/22/2018   Closed fracture of proximal end of left humerus 03/22/2018   Closed fracture of right tibial plateau with routine healing 03/22/2018   Hyperlipidemia with target LDL less than 70 07/14/2017   Primary osteoarthritis of both knees 05/21/2016   History of influenza 05/04/2016   Dyspnea on effort 03/12/2016   GERD (gastroesophageal reflux disease) 01/27/2016   Calculus of gallbladder without cholecystitis without obstruction 10/21/2015   Pseudophakia of left eye 10/07/2015  Pseudophakia of right eye 08/26/2015   Morbid obesity with BMI of 50.0-59.9, adult ([place]) 07/29/2015   Bilateral lower  extremity edema 07/08/2015   Hallux valgus, acquired 07/08/2015   Onychomycosis due to dermatophyte 07/08/2015   At high risk for falls 12/10/2014   Essential hypertension 12/10/2014   Hypertension associated with type 2 diabetes mellitus ([place]) 12/10/2014   Vitamin D deficiency 11/21/2014   Diabetes mellitus with insulin therapy ([place]) 10/13/2012   Sarcoidosis 01/31/2012     Past Medical History:  Diagnosis Date   Diabetes mellitus without complication ([place])    Hypertension    Renal disorder    Stroke ([place])    times 6     Past Surgical History:  Procedure Laterality Date   CESAREAN SECTION     DIALYSIS/PERMA CATHETER INSERTION N/A 10/14/2021   Procedure: DIALYSIS/PERMA CATHETER INSERTION;  Surgeon: Algernon Huxley, MD;  Location: [place] [facility];  Service: Cardiovascular;  Laterality: N/A;    Social History   Socioeconomic History   Marital status: Widowed    Spouse name: Not on file   Number of children: Not on file   Years of education: Not on file   Highest education level: Not on file  Occupational History   Not on file  Tobacco Use   Smoking status: Former    Types: Cigarettes   Smokeless tobacco: Never  Substance and Sexual Activity   Alcohol use: Never   Drug use: Never   Sexual activity: Not on file  Other Topics Concern   Not on file  Social History Narrative   Not on file   Social Determinants of Health   Financial Resource Strain: Not on file  Food Insecurity: Not on file  Transportation Needs: Not on file  Physical Activity: Not on file  Stress: Not on file  Social Connections: Not on file  Intimate Partner Violence: Not on file     History reviewed. No pertinent family history.   Current Outpatient Medications:    acetaminophen (TYLENOL) 325 MG tablet, Take 2 tablets by mouth every 8 (eight) hours as needed., Disp: , Rfl:    apixaban (ELIQUIS) 5 MG TABS tablet, Take 1 tablet by mouth every 12 (twelve) hours., Disp: , Rfl:     atorvastatin (LIPITOR) 80 MG tablet, Take 80 mg by mouth daily., Disp: , Rfl:    Calcium Carb-Cholecalciferol (OYSTER SHELL CALCIUM W/D) 500-5 MG-MCG TABS, Take 1 tablet by mouth daily as needed., Disp: , Rfl:    carvedilol (COREG) 25 MG tablet, Take 1 tablet by mouth 2 (two) times daily., Disp: , Rfl:    doxycycline (MONODOX) 100 MG capsule, Take 1 capsule by mouth every 12 (twelve) hours., Disp: , Rfl:    ferrous sulfate 324 (65 Fe) MG TBEC, Take 1 tablet by mouth daily., Disp: , Rfl:    folic acid (FOLVITE) 1 MG tablet, Take 1 mg by mouth daily., Disp: , Rfl:    furosemide (LASIX) 40 MG tablet, Take 40 mg by mouth daily., Disp: , Rfl:    gabapentin (NEURONTIN) 100 MG capsule, Take 2 capsules by mouth 2 (two) times daily., Disp: , Rfl:    insulin glargine (LANTUS) 100 UNIT/ML Solostar Pen, Inject 10 Units into the skin at bedtime., Disp: , Rfl:    insulin lispro (HUMALOG) 100 UNIT/ML injection, Inject 2-12 Units into the skin in the morning, at noon, and at bedtime. Per sliding scale, Disp: , Rfl:    melatonin 3 MG TABS tablet, Take 2 tablets  by mouth at bedtime., Disp: , Rfl:    montelukast (SINGULAIR) 10 MG tablet, Take 10 mg by mouth daily., Disp: , Rfl:    traMADol (ULTRAM) 50 MG tablet, Take by mouth., Disp: , Rfl:    traZODone (DESYREL) 50 MG tablet, Take 1 tablet by mouth at bedtime as needed., Disp: , Rfl:    Physical exam:  Vitals:   10/27/21 1049  BP: (!) 157/59  Pulse: 64  Resp: 18  Temp: 98 F (36.7 C)  TempSrc: Tympanic  SpO2: 100%  Weight: (!) 314 lb (142.4 kg)   Physical Exam Constitutional:      Appearance: She is obese.  Cardiovascular:     Rate and Rhythm: Normal rate and regular rhythm.     Heart sounds: Normal heart sounds.     Comments: Right chest wall dialysis catheter in place Pulmonary:     Effort: Pulmonary effort is normal.     Breath sounds: Normal breath sounds.  Abdominal:     General: Bowel sounds are normal.     Palpations: Abdomen is soft.   Musculoskeletal:     Comments: Changes of chronic stasis dermatitis and lymphedema  Skin:    General: Skin is warm and dry.  Neurological:     Mental Status: She is alert and oriented to person, place, and time.           Latest Ref Rng & Units 02/27/2021    8:31 AM  CMP  Glucose 70 - 99 mg/dL 101   BUN 8 - 23 mg/dL 60   Creatinine 0.44 - 1.00 mg/dL 2.97   Sodium 135 - 145 mmol/L 140   Potassium 3.5 - 5.1 mmol/L 5.1   Chloride 98 - 111 mmol/L 113   CO2 22 - 32 mmol/L 19   Calcium 8.9 - 10.3 mg/dL 8.4       Latest Ref Rng & Units 02/27/2021    8:31 AM  CBC  WBC 4.0 - 10.5 K/uL 5.7   Hemoglobin 12.0 - 15.0 g/dL 8.6   Hematocrit 36.0 - 46.0 % 26.4   Platelets 150 - 400 K/uL 177     No images are attached to the encounter.  PERIPHERAL VASCULAR CATHETERIZATION  Result Date: 10/14/2021 See surgical note for result.   Assessment and plan- Patient is a 71 y.o. female referred for normocytic anemia possibly secondary to kidney disease  I will do a complete anemia work-up today including a CBC with differential CMP ferritin iron studies B12 folate TSH myeloma panel and serum free light chains reticulocyte count haptoglobin and LDH.  Video visit with me in 2 weeks time.  If patient is already on hemodialysis typically patients get IV iron and EPO at the time of dialysis.  I will confirm the same with Dr. Precious Gilding office.   Thank you for this kind referral and the opportunity to participate in the care of this patient   Visit Diagnosis 1. Anemia of chronic kidney failure, stage 5 (HCC)     Dr. Randa Evens, MD, MPH [facility] at [facility] [phone] 10/27/2021

## 2021-10-27 NOTE — Telephone Encounter (Signed)
Left VM with the scheduler Olivia Mackie) at [facility] and requested she call back to schedule a virtual visit with Dr. Janese Banks (need to confirm the facility can assist with this).

## 2021-10-28 ENCOUNTER — Other Ambulatory Visit: Payer: Self-pay

## 2021-10-28 ENCOUNTER — Telehealth: Payer: Self-pay | Admitting: Oncology

## 2021-10-28 DIAGNOSIS — D631 Anemia in chronic kidney disease: Secondary | ICD-10-CM

## 2021-10-28 DIAGNOSIS — I132 Hypertensive heart and chronic kidney disease with heart failure and with stage 5 chronic kidney disease, or end stage renal disease: Secondary | ICD-10-CM | POA: Diagnosis not present

## 2021-10-28 DIAGNOSIS — D649 Anemia, unspecified: Secondary | ICD-10-CM

## 2021-10-28 NOTE — Progress Notes (Signed)
B12 Reticulocytes Multiple myeloma panel  CMP Folate Ferritin Haptoglobin Tsh Kappa lambda light chains LDH CBC w diff

## 2021-10-28 NOTE — Telephone Encounter (Signed)
[facility] called back to discuss the lab draw from dialysis. They asked if a clinical staff member could call and ask for the "B-Hall Unit Manager".

## 2021-10-30 LAB — COMPREHENSIVE METABOLIC PANEL
ALT: 10 U/L (ref 0–44)
AST: 14 U/L — ABNORMAL LOW (ref 15–41)
Albumin: 3.1 g/dL — ABNORMAL LOW (ref 3.5–5.0)
Alkaline Phosphatase: 55 U/L (ref 38–126)
Anion gap: 8 (ref 5–15)
BUN: 41 mg/dL — ABNORMAL HIGH (ref 8–23)
CO2: 27 mmol/L (ref 22–32)
Calcium: 8.2 mg/dL — ABNORMAL LOW (ref 8.9–10.3)
Chloride: 98 mmol/L (ref 98–111)
Creatinine, Ser: 3.96 mg/dL — ABNORMAL HIGH (ref 0.44–1.00)
GFR, Estimated: 12 mL/min — ABNORMAL LOW (ref >60)
Glucose, Bld: 190 mg/dL — ABNORMAL HIGH (ref 70–99)
Potassium: 3.9 mmol/L (ref 3.5–5.1)
Sodium: 133 mmol/L — ABNORMAL LOW (ref 135–145)
Total Bilirubin: 0.6 mg/dL (ref 0.3–1.2)
Total Protein: 7.1 g/dL (ref 6.5–8.1)

## 2021-10-30 LAB — FOLATE: Folate: >40 ng/mL (ref >5.9)

## 2021-10-30 LAB — CBC WITH DIFFERENTIAL/PLATELET
Abs Immature Granulocytes: 0.06 10*3/uL (ref 0.00–0.07)
Basophils Absolute: 0 10*3/uL (ref 0.0–0.1)
Basophils Relative: 1 %
Eosinophils Absolute: 0.2 10*3/uL (ref 0.0–0.5)
Eosinophils Relative: 3 %
HCT: 26.2 % — ABNORMAL LOW (ref 36.0–46.0)
Hemoglobin: 8.2 g/dL — ABNORMAL LOW (ref 12.0–15.0)
Immature Granulocytes: 1 %
Lymphocytes Relative: 20 %
Lymphs Abs: 1 10*3/uL (ref 0.7–4.0)
MCH: 29 pg (ref 26.0–34.0)
MCHC: 31.3 g/dL (ref 30.0–36.0)
MCV: 92.6 fL (ref 80.0–100.0)
Monocytes Absolute: 0.7 10*3/uL (ref 0.1–1.0)
Monocytes Relative: 14 %
Neutro Abs: 3.1 10*3/uL (ref 1.7–7.7)
Neutrophils Relative %: 61 %
Platelets: 175 10*3/uL (ref 150–400)
RBC: 2.83 MIL/uL — ABNORMAL LOW (ref 3.87–5.11)
RDW: 13.1 % (ref 11.5–15.5)
WBC: 5 10*3/uL (ref 4.0–10.5)
nRBC: 0 % (ref 0.0–0.2)

## 2021-10-30 LAB — RETICULOCYTES
Immature Retic Fract: 10.5 % (ref 2.3–15.9)
RBC.: 2.84 MIL/uL — ABNORMAL LOW (ref 3.87–5.11)
Retic Count, Absolute: 64.2 10*3/uL (ref 19.0–186.0)
Retic Ct Pct: 2.3 % (ref 0.4–3.1)

## 2021-10-30 LAB — LACTATE DEHYDROGENASE: LDH: 175 U/L (ref 98–192)

## 2021-10-30 LAB — FERRITIN: Ferritin: 88 ng/mL (ref 11–307)

## 2021-10-30 LAB — VITAMIN B12: Vitamin B-12: 728 pg/mL (ref 180–914)

## 2021-10-30 LAB — TSH: TSH: 2.195 u[IU]/mL (ref 0.350–4.500)

## 2021-10-30 NOTE — Telephone Encounter (Signed)
I confirmed that sabrina said that a member of the [employer] [facility]) came and got the tubes and he told here they will take it to [place] and then bring it back when done

## 2021-10-31 LAB — HAPTOGLOBIN: Haptoglobin: 128 mg/dL (ref 37–355)

## 2021-11-02 LAB — KAPPA/LAMBDA LIGHT CHAINS
Kappa free light chain: 144.7 mg/L — ABNORMAL HIGH (ref 3.3–19.4)
Kappa, lambda light chain ratio: 1.89 — ABNORMAL HIGH (ref 0.26–1.65)
Lambda free light chains: 76.5 mg/L — ABNORMAL HIGH (ref 5.7–26.3)

## 2021-11-03 LAB — MULTIPLE MYELOMA PANEL, SERUM
Albumin SerPl Elph-Mcnc: 3.3 g/dL (ref 2.9–4.4)
Albumin/Glob SerPl: 1.1 (ref 0.7–1.7)
Alpha 1: 0.2 g/dL (ref 0.0–0.4)
Alpha2 Glob SerPl Elph-Mcnc: 0.7 g/dL (ref 0.4–1.0)
B-Globulin SerPl Elph-Mcnc: 0.8 g/dL (ref 0.7–1.3)
Gamma Glob SerPl Elph-Mcnc: 1.7 g/dL (ref 0.4–1.8)
Globulin, Total: 3.3 g/dL (ref 2.2–3.9)
IgA: 284 mg/dL (ref 87–352)
IgG (Immunoglobin G), Serum: 1551 mg/dL (ref 586–1602)
IgM (Immunoglobulin M), Srm: 185 mg/dL (ref 26–217)
Total Protein ELP: 6.6 g/dL (ref 6.0–8.5)

## 2021-11-05 ENCOUNTER — Telehealth: Payer: Self-pay | Admitting: Oncology

## 2021-11-05 NOTE — Telephone Encounter (Signed)
VM left with Olivia Mackie @ [facility] and requested she please call back to assist with setting up a virtual visit with Dr. Janese Banks.

## 2021-11-06 ENCOUNTER — Telehealth: Payer: Self-pay | Admitting: Oncology

## 2021-11-06 NOTE — Telephone Encounter (Signed)
Left Vm with patient's daughter requesting she call back to assist with setting up a virtual visit with patient. Multiple attempts to reach staff members at [facility].

## 2021-12-25 ENCOUNTER — Emergency Department: Payer: 59

## 2021-12-25 ENCOUNTER — Other Ambulatory Visit: Payer: Self-pay

## 2021-12-25 ENCOUNTER — Emergency Department
Admission: EM | Admit: 2021-12-25 | Discharge: 2021-12-25 | Disposition: A | Discharge status: Skilled Nursing Facility | Payer: 59 | Attending: Emergency Medicine | Admitting: Emergency Medicine

## 2021-12-25 ENCOUNTER — Telehealth: Payer: Self-pay | Admitting: Oncology

## 2021-12-25 DIAGNOSIS — Z79899 Other long term (current) drug therapy: Secondary | ICD-10-CM | POA: Insufficient documentation

## 2021-12-25 DIAGNOSIS — E119 Type 2 diabetes mellitus without complications: Secondary | ICD-10-CM | POA: Diagnosis not present

## 2021-12-25 DIAGNOSIS — S0990XA Unspecified injury of head, initial encounter: Secondary | ICD-10-CM | POA: Insufficient documentation

## 2021-12-25 DIAGNOSIS — M545 Low back pain, unspecified: Secondary | ICD-10-CM | POA: Insufficient documentation

## 2021-12-25 DIAGNOSIS — W19XXXA Unspecified fall, initial encounter: Secondary | ICD-10-CM | POA: Insufficient documentation

## 2021-12-25 DIAGNOSIS — Z7901 Long term (current) use of anticoagulants: Secondary | ICD-10-CM | POA: Insufficient documentation

## 2021-12-25 DIAGNOSIS — Z794 Long term (current) use of insulin: Secondary | ICD-10-CM | POA: Diagnosis not present

## 2021-12-25 DIAGNOSIS — I10 Essential (primary) hypertension: Secondary | ICD-10-CM | POA: Diagnosis not present

## 2021-12-25 MED ORDER — HYDROCODONE-ACETAMINOPHEN 5-325 MG PO TABS
1.0000 | ORAL_TABLET | Freq: Once | ORAL | Status: AC
  Administered 2021-12-25: 1 via ORAL
  Filled 2021-12-25: qty 1

## 2021-12-25 MED ORDER — ONDANSETRON 4 MG PO TBDP
4.0000 mg | ORAL_TABLET | Freq: Once | ORAL | Status: AC
  Administered 2021-12-25: 4 mg via ORAL
  Filled 2021-12-25: qty 1

## 2021-12-25 NOTE — ED Notes (Signed)
Called [facility] for transport to [facility]

## 2021-12-25 NOTE — ED Notes (Signed)
Attempted to call report to [facility] with no success

## 2021-12-25 NOTE — ED Triage Notes (Signed)
Pt presents to ER via ems from [facility] after unwitnessed fall.  Pt states she was using her walker to walk to her room, and her right knee gave out.  Pt states she hit back of her head on a footboard.  Pt denies LOC, no lac noted.  Pt does have hematoma noted to back of head and is on eliquis.  Pt is otherwise A&O x4 at this time in NAD in triage.

## 2021-12-25 NOTE — Telephone Encounter (Signed)
Left VM with patient and requested she call back to schedule a virtual visit with Dr. Janese Banks.

## 2021-12-25 NOTE — ED Provider Notes (Signed)
[facility] Provider Note    Event Date/Time   First MD Initiated Contact with Patient 12/25/21 [phone]     (approximate)   History   Fall   HPI  Jody Nelson is a 71 y.o. female with history of hypertension, diabetes, CVA on Eliquis, end-stage renal disease on hemodialysis who presents to the emergency department after she had a mechanical fall.  She states that her right knee gave out on her and she fell and hit her head.  She is complaining of pain in the lower back and tailbone.  No numbness, tingling or weakness.  No chest pain or abdominal pain.  No preceding symptoms that led to her fall.   History provided by patient.    Past Medical History:  Diagnosis Date   Diabetes mellitus without complication ([place])    Hypertension    Renal disorder    Stroke [facility])    times 6    Past Surgical History:  Procedure Laterality Date   CESAREAN SECTION     DIALYSIS/PERMA CATHETER INSERTION N/A 10/14/2021   Procedure: DIALYSIS/PERMA CATHETER INSERTION;  Surgeon: Algernon Huxley, MD;  Location: [place] [facility];  Service: Cardiovascular;  Laterality: N/A;    MEDICATIONS:  Prior to Admission medications   Medication Sig Start Date End Date Taking? Authorizing Provider  acetaminophen (TYLENOL) 325 MG tablet Take 2 tablets by mouth every 8 (eight) hours as needed.    [provider]  apixaban (ELIQUIS) 5 MG TABS tablet Take 1 tablet by mouth every 12 (twelve) hours. 08/16/18   [provider]  atorvastatin (LIPITOR) 80 MG tablet Take 80 mg by mouth daily. 02/19/21   [provider]  calcitRIOL (ROCALTROL) 0.25 MCG capsule Take 0.25 mcg by mouth daily. 10/22/21   [provider]  Calcium Carb-Cholecalciferol (OYSTER SHELL CALCIUM W/D) 500-5 MG-MCG TABS Take 1 tablet by mouth daily as needed. 02/19/21   [provider]  carvedilol (COREG) 25 MG tablet Take 1 tablet by mouth 2 (two) times daily. 02/19/21   [provider]  diclofenac Sodium (VOLTAREN) 1 % GEL Apply topically. 10/14/21   [provider]  docusate sodium (COLACE) 100 MG capsule Take 100 mg by mouth 2 (two) times daily.    [provider]  doxycycline (MONODOX) 100 MG capsule Take 1 capsule by mouth every 12 (twelve) hours. 02/18/21   [provider]  ferrous sulfate 324 (65 Fe) MG TBEC Take 1 tablet by mouth daily. 02/19/21   [provider]  folic acid (FOLVITE) 1 MG tablet Take 1 mg by mouth daily. 02/19/21   [provider]  furosemide (LASIX) 40 MG tablet Take 40 mg by mouth daily. 02/19/21   [provider]  gabapentin (NEURONTIN) 100 MG capsule Take 2 capsules by mouth 2 (two) times daily. 02/19/21   [provider]  insulin glargine (LANTUS) 100 UNIT/ML Solostar Pen Inject 10 Units into the skin at bedtime. Patient not taking: Reported on 10/27/2021 02/17/21   [provider]  insulin glargine-yfgn (SEMGLEE) 100 UNIT/ML Pen Inject into the skin. 08/24/21   [provider]  insulin lispro (HUMALOG) 100 UNIT/ML injection Inject 2-12 Units into the skin in the morning, at noon, and at bedtime. Per sliding scale    [provider]  ipratropium-albuterol (DUONEB) 0.5-2.5 (3) MG/3ML SOLN Take by nebulization. 05/18/21   [provider]  melatonin 3 MG TABS tablet Take 2 tablets by mouth at bedtime. 03/16/18   [provider]  montelukast (SINGULAIR) 10 MG tablet Take 10 mg by mouth daily. 02/19/21   [provider]  omeprazole (PRILOSEC) 20 MG capsule Take 20 mg by mouth daily. 10/22/21   [provider]  torsemide (DEMADEX) 10 MG tablet Take 10 mg by mouth daily. 10/22/21   [provider]  traMADol (ULTRAM) 50 MG tablet Take by mouth. 10/23/21   [provider]  traZODone (DESYREL) 50 MG tablet Take 1 tablet by mouth at bedtime as needed. 08/22/20   [provider]    Physical Exam    Triage Vital Signs: ED Triage Vitals  Enc Vitals Group     BP 12/25/21 0019 (!) 150/68     Pulse Rate 12/25/21 0019 74     Resp 12/25/21 0019 15     Temp 12/25/21 0019 98.2 F (36.8 C)     Temp Source 12/25/21 0019 Oral     SpO2 12/25/21 0019 100 %     Weight 12/25/21 0020 (!) 313 lb 0.9 oz (142 kg)     Height --      Head Circumference --      Peak Flow --      Pain Score 12/25/21 0020 2     Pain Loc --      Pain Edu? --      Excl. in [place]? --     Most recent vital signs: Vitals:   12/25/21 0430 12/25/21 0637  BP: (!) 145/70 (!) 140/70  Pulse: 67 72  Resp: 16 16  Temp:  (!) 97.5 F (36.4 C)  SpO2: 100% 100%     CONSTITUTIONAL: Alert and oriented and responds appropriately to questions.  Orbitally obese.  In no distress. HEAD: Normocephalic; atraumatic EYES: Conjunctivae clear, PERRL, EOMI ENT: normal nose; no rhinorrhea; moist mucous membranes; pharynx without lesions noted; no dental injury; no septal hematoma, no epistaxis; no facial deformity or bony tenderness NECK: Supple, no midline spinal tenderness, step-off or deformity; trachea midline CARD: RRR; S1 and S2 appreciated; no murmurs, no clicks, no rubs, no gallops RESP: Normal chest excursion without splinting or tachypnea; breath sounds clear and equal bilaterally; no wheezes, no rhonchi, no rales; no hypoxia or respiratory distress CHEST:  chest wall stable, no crepitus or ecchymosis or deformity, nontender to palpation; no flail chest ABD/GI: Normal bowel sounds; non-distended; soft, non-tender, no rebound, no guarding; no ecchymosis or other lesions noted PELVIS:  stable, nontender to palpation BACK:  The back appears normal; there are over the lower lumbar spine without step-off or deformity.  Tender over the coccyx. EXT: No bony deformity or bony tenderness.  Signs of chronic venous stasis and edema in the bilateral lower extremities without asymmetry.  Compartments soft. SKIN: Normal color for age and  race; warm NEURO: No facial asymmetry, normal speech, moving all extremities equally  ED Results / Procedures / Treatments   LABS: (all labs ordered are listed, but only abnormal results are displayed) Labs Reviewed - No data to display   EKG:    RADIOLOGY: My personal review and interpretation of imaging: CT head and cervical spine x-rays of the lumbar spine and sacrum/coccyx show no traumatic injury.  I have personally reviewed all radiology reports. DG Sacrum/Coccyx  Result Date: 12/25/2021 CLINICAL DATA:  Fall with low back pain. EXAM: SACRUM AND COCCYX - 2+ VIEW COMPARISON:  None Available. FINDINGS: There is no evidence of fracture or other focal bone lesions. IMPRESSION: Negative. Electronically Signed   By: Verda Cumins.D.  On: 12/25/2021 06:04   DG Lumbar Spine Complete  Result Date: 12/25/2021 CLINICAL DATA:  Fall with low back pain. EXAM: LUMBAR SPINE - COMPLETE 4+ VIEW COMPARISON:  None Available. FINDINGS: No evidence for an acute fracture. No subluxation. Loss of disc height noted at T12-L1 with endplate degeneration. SI joints unremarkable. Bones are diffusely demineralized IMPRESSION: Negative. Electronically Signed   By: Misty Stanley M.D.   On: 12/25/2021 06:03   CT HEAD WO CONTRAST (5MM)  Result Date: 12/25/2021 CLINICAL DATA:  Un witnessed fall with headaches and neck pain, initial encounter EXAM: CT HEAD WITHOUT CONTRAST CT CERVICAL SPINE WITHOUT CONTRAST TECHNIQUE: Multidetector CT imaging of the head and cervical spine was performed following the standard protocol without intravenous contrast. Multiplanar CT image reconstructions of the cervical spine were also generated. RADIATION DOSE REDUCTION: This exam was performed according to the departmental dose-optimization program which includes automated exposure control, adjustment of the mA and/or kV according to patient size and/or use of iterative reconstruction technique. COMPARISON:  None Available. FINDINGS:  CT HEAD FINDINGS Brain: No evidence of acute infarction, hemorrhage, hydrocephalus, extra-axial collection or mass lesion/mass effect. Chronic atrophic and ischemic changes are noted. Findings of prior left MCA infarct are noted with encephalomalacia. Vascular: No hyperdense vessel or unexpected calcification. Skull: Normal. Negative for fracture or focal lesion. Sinuses/Orbits: No acute finding. Other: None. CT CERVICAL SPINE FINDINGS Alignment: Within normal limits. Skull base and vertebrae: 7 cervical segments are well visualized. Vertebral body height is well maintained. Multilevel osteophytic changes and facet hypertrophic changes are noted. No acute fracture or acute facet abnormality is noted. Right jugular dialysis catheter is noted. Soft tissues and spinal canal: Surrounding soft tissue structures are within normal limits. No hematoma or lymphadenopathy is seen. Upper chest: Lung apices are within normal limits. Other: None IMPRESSION: CT of the head: Chronic atrophic and ischemic changes. CT of the cervical spine: Multilevel degenerative changes without acute abnormality. Electronically Signed   By: Inez Catalina M.D.   On: 12/25/2021 00:51   CT Cervical Spine Wo Contrast  Result Date: 12/25/2021 CLINICAL DATA:  Un witnessed fall with headaches and neck pain, initial encounter EXAM: CT HEAD WITHOUT CONTRAST CT CERVICAL SPINE WITHOUT CONTRAST TECHNIQUE: Multidetector CT imaging of the head and cervical spine was performed following the standard protocol without intravenous contrast. Multiplanar CT image reconstructions of the cervical spine were also generated. RADIATION DOSE REDUCTION: This exam was performed according to the departmental dose-optimization program which includes automated exposure control, adjustment of the mA and/or kV according to patient size and/or use of iterative reconstruction technique. COMPARISON:  None Available. FINDINGS: CT HEAD FINDINGS Brain: No evidence of acute  infarction, hemorrhage, hydrocephalus, extra-axial collection or mass lesion/mass effect. Chronic atrophic and ischemic changes are noted. Findings of prior left MCA infarct are noted with encephalomalacia. Vascular: No hyperdense vessel or unexpected calcification. Skull: Normal. Negative for fracture or focal lesion. Sinuses/Orbits: No acute finding. Other: None. CT CERVICAL SPINE FINDINGS Alignment: Within normal limits. Skull base and vertebrae: 7 cervical segments are well visualized. Vertebral body height is well maintained. Multilevel osteophytic changes and facet hypertrophic changes are noted. No acute fracture or acute facet abnormality is noted. Right jugular dialysis catheter is noted. Soft tissues and spinal canal: Surrounding soft tissue structures are within normal limits. No hematoma or lymphadenopathy is seen. Upper chest: Lung apices are within normal limits. Other: None IMPRESSION: CT of the head: Chronic atrophic and ischemic changes. CT of the cervical spine: Multilevel degenerative changes without  acute abnormality. Electronically Signed   By: Inez Catalina M.D.   On: 12/25/2021 00:51     PROCEDURES:  Critical Care performed: No     Procedures    IMPRESSION / MDM / ASSESSMENT AND PLAN / ED COURSE  I reviewed the triage vital signs and the nursing notes.  Patient here after mechanical fall from her nursing home.  She did hit her head and is on Eliquis.    DIFFERENTIAL DIAGNOSIS (includes but not limited to):   Head injury, concussion, skull fracture, intracranial hemorrhage, lumbar spinal fracture, sacrum or coccyx fracture, contusion  Patient's presentation is most consistent with acute presentation with potential threat to life or bodily function.  PLAN: CT head and cervical spine obtained from triage.  Reviewed and interpreted by myself and the radiologist and show no acute abnormality.  Will obtain x-rays of the lumbar spine and sacrum/coccyx.  Will provide with pain  medication here.   MEDICATIONS GIVEN IN ED: Medications  HYDROcodone-acetaminophen (NORCO/VICODIN) 5-325 MG per tablet 1 tablet (1 tablet Oral Given 12/25/21 0451)  ondansetron (ZOFRAN-ODT) disintegrating tablet 4 mg (4 mg Oral Given 12/25/21 0451)     ED COURSE: X-rays reviewed and interpreted by myself and the radiologist and shows no fracture, dislocation.  Patient reports feeling better and is comfortable with plan for discharge back to nursing facility.   At this time, I do not feel there is any life-threatening condition present. I reviewed all nursing notes, vitals, pertinent previous records.  All lab and urine results, EKGs, imaging ordered have been independently reviewed and interpreted by myself.  I reviewed all available radiology reports from any imaging ordered this visit.  Based on my assessment, I feel the patient is safe to be discharged home without further emergent workup and can continue workup as an outpatient as needed. Discussed all findings, treatment plan as well as usual and customary return precautions.  They verbalize understanding and are comfortable with this plan.  Outpatient follow-up has been provided as needed.  All questions have been answered.    CONSULTS: No admission needed at this time.  Here for mechanical fall and imaging shows no traumatic injury.   OUTSIDE RECORDS REVIEWED: Reviewed patient's recent admission in June 2023 for tunneled catheter placement for dialysis by Dr. Lucky Cowboy.       FINAL CLINICAL IMPRESSION(S) / ED DIAGNOSES   Final diagnoses:  Fall, initial encounter  Injury of head, initial encounter  Low back pain at multiple sites     Rx / DC Orders   ED Discharge Orders     None        Note:  This document was prepared using Dragon voice recognition software and may include unintentional dictation errors.   Melenda Bielak, Delice Bison, DO 12/25/21 [phone]

## 2021-12-25 NOTE — ED Notes (Signed)
Report given to April RN at [facility] who stated that their facility is unable to provide transportation for the patient to return.

## 2021-12-25 NOTE — Discharge Instructions (Signed)
You may take over-the-counter Tylenol 1000 mg every 6 hours as needed for pain.  CT of your head, cervical spine and x-rays of your lumbar spine and sacrum/coccyx showed no acute traumatic injury.

## 2021-12-25 NOTE — ED Notes (Addendum)
Pt taken to xray 

## 2021-12-27 DIAGNOSIS — N186 End stage renal disease: Secondary | ICD-10-CM | POA: Insufficient documentation

## 2021-12-27 NOTE — Progress Notes (Deleted)
MRN : [id number]  Jody Nelson is a 71 y.o. (December 06, 1950) female who presents with chief complaint of check access.  History of Present Illness:    The patient is seen for evaluation of dialysis access.  Catheter was placed 10/14/2021.    Current access is via a catheter which is functioning poorly the flow rates have been less than ideal.  There have not been multiple episodes of catheter infection.  The patient denies fever and chills while on dialysis.  No tenderness or drainage at the exit site.  No recent shortening of the patient's walking distance or new symptoms consistent with claudication.  No history of rest pain symptoms. No new ulcers or wounds of the lower extremities have occurred.  The patient denies amaurosis fugax or recent TIA symptoms. There are no recent neurological changes noted. There is no history of DVT, PE or superficial thrombophlebitis. No recent episodes of angina or shortness of breath documented.    No outpatient medications have been marked as taking for the 12/28/21 encounter (Appointment) with Delana Meyer, Dolores Lory, MD.    Past Medical History:  Diagnosis Date   Diabetes mellitus without complication ([place])    Hypertension    Renal disorder    Stroke [facility])    times 6    Past Surgical History:  Procedure Laterality Date   CESAREAN SECTION     DIALYSIS/PERMA CATHETER INSERTION N/A 10/14/2021   Procedure: DIALYSIS/PERMA CATHETER INSERTION;  Surgeon: Algernon Huxley, MD;  Location: [place] [facility];  Service: Cardiovascular;  Laterality: N/A;    Social History Social History   Tobacco Use   Smoking status: Former    Types: Cigarettes   Smokeless tobacco: Never  Substance Use Topics   Alcohol use: Never   Drug use: Never    Family History Family History  Problem Relation Age of Onset   Diabetes Mother    Colon cancer Sister    Diabetes Sister    Cancer Maternal Uncle    Clotting disorder Paternal Grandmother     Colon cancer Paternal Grandfather     Allergies  Allergen Reactions   Sulfamethoxazole-Trimethoprim     Other reaction(s): Kidney Disorder Other reaction(s): Kidney Disorder Hyperkalemia, AKI Hyperkalemia, AKI      REVIEW OF SYSTEMS (Negative unless checked)  Constitutional: [] Weight loss  [] Fever  [] Chills Cardiac: [] Chest pain   [] Chest pressure   [] Palpitations   [] Shortness of breath when laying flat   [] Shortness of breath with exertion. Vascular:  [] Pain in legs with walking   [] Pain in legs at rest  [] History of DVT   [] Phlebitis   [] Swelling in legs   [] Varicose veins   [] Non-healing ulcers Pulmonary:   [] Uses home oxygen   [] Productive cough   [] Hemoptysis   [] Wheeze  [] COPD   [] Asthma Neurologic:  [] Dizziness   [] Seizures   [] History of stroke   [] History of TIA  [] Aphasia   [] Vissual changes   [] Weakness or numbness in arm   [] Weakness or numbness in leg Musculoskeletal:   [] Joint swelling   [] Joint pain   [] Low back pain Hematologic:  [] Easy bruising  [] Easy bleeding   [] Hypercoagulable state   [] Anemic Gastrointestinal:  [] Diarrhea   [] Vomiting  [x] Gastroesophageal reflux/heartburn   [] Difficulty swallowing. Genitourinary:  [x] Chronic kidney disease   [] Difficult urination  [] Frequent urination   [] Blood in urine Skin:  [] Rashes   []   Ulcers  Psychological:  [] History of anxiety   []  History of major depression.  Physical Examination  There were no vitals filed for this visit. There is no height or weight on file to calculate BMI. Gen: WD/WN, NAD Head: [place]/AT, No temporalis wasting.  Ear/Nose/Throat: Hearing grossly intact, nares w/o erythema or drainage Eyes: PER, EOMI, sclera nonicteric.  Neck: Supple, no gross masses or lesions.  No JVD.  Pulmonary:  Good air movement, no audible wheezing, no use of accessory muscles.  Cardiac: RRR, precordium non-hyperdynamic. Vascular:   *** Vessel Right Left  Radial Palpable Palpable  Brachial Palpable Palpable   Gastrointestinal: soft, non-distended. No guarding/no peritoneal signs.  Musculoskeletal: M/S 5/5 throughout.  No deformity.  Neurologic: CN 2-12 intact. Pain and light touch intact in extremities.  Symmetrical.  Speech is fluent. Motor exam as listed above. Psychiatric: Judgment intact, Mood & affect appropriate for pt's clinical situation. Dermatologic: No rashes or ulcers noted.  No changes consistent with cellulitis.   CBC Lab Results  Component Value Date   WBC 5.0 10/28/2021   HGB 8.2 (L) 10/28/2021   HCT 26.2 (L) 10/28/2021   MCV 92.6 10/28/2021   PLT 175 10/28/2021    BMET    Component Value Date/Time   NA 133 (L) 10/28/2021 1548   K 3.9 10/28/2021 1548   CL 98 10/28/2021 1548   CO2 27 10/28/2021 1548   GLUCOSE 190 (H) 10/28/2021 1548   BUN 41 (H) 10/28/2021 1548   CREATININE 3.96 (H) 10/28/2021 1548   CALCIUM 8.2 (L) 10/28/2021 1548   GFRNONAA 12 (L) 10/28/2021 1548   CrCl cannot be calculated (Patient's most recent lab result is older than the maximum 21 days allowed.).  COAG No results found for: "INR", "PROTIME"  Radiology DG Sacrum/Coccyx  Result Date: 12/25/2021 CLINICAL DATA:  Fall with low back pain. EXAM: SACRUM AND COCCYX - 2+ VIEW COMPARISON:  None Available. FINDINGS: There is no evidence of fracture or other focal bone lesions. IMPRESSION: Negative. Electronically Signed   By: Misty Stanley M.D.   On: 12/25/2021 06:04   DG Lumbar Spine Complete  Result Date: 12/25/2021 CLINICAL DATA:  Fall with low back pain. EXAM: LUMBAR SPINE - COMPLETE 4+ VIEW COMPARISON:  None Available. FINDINGS: No evidence for an acute fracture. No subluxation. Loss of disc height noted at T12-L1 with endplate degeneration. SI joints unremarkable. Bones are diffusely demineralized IMPRESSION: Negative. Electronically Signed   By: Misty Stanley M.D.   On: 12/25/2021 06:03   CT HEAD WO CONTRAST (5MM)  Result Date: 12/25/2021 CLINICAL DATA:  Un witnessed fall with headaches and  neck pain, initial encounter EXAM: CT HEAD WITHOUT CONTRAST CT CERVICAL SPINE WITHOUT CONTRAST TECHNIQUE: Multidetector CT imaging of the head and cervical spine was performed following the standard protocol without intravenous contrast. Multiplanar CT image reconstructions of the cervical spine were also generated. RADIATION DOSE REDUCTION: This exam was performed according to the departmental dose-optimization program which includes automated exposure control, adjustment of the mA and/or kV according to patient size and/or use of iterative reconstruction technique. COMPARISON:  None Available. FINDINGS: CT HEAD FINDINGS Brain: No evidence of acute infarction, hemorrhage, hydrocephalus, extra-axial collection or mass lesion/mass effect. Chronic atrophic and ischemic changes are noted. Findings of prior left MCA infarct are noted with encephalomalacia. Vascular: No hyperdense vessel or unexpected calcification. Skull: Normal. Negative for fracture or focal lesion. Sinuses/Orbits: No acute finding. Other: None. CT CERVICAL SPINE FINDINGS Alignment: Within normal limits. Skull base and vertebrae: 7 cervical  segments are well visualized. Vertebral body height is well maintained. Multilevel osteophytic changes and facet hypertrophic changes are noted. No acute fracture or acute facet abnormality is noted. Right jugular dialysis catheter is noted. Soft tissues and spinal canal: Surrounding soft tissue structures are within normal limits. No hematoma or lymphadenopathy is seen. Upper chest: Lung apices are within normal limits. Other: None IMPRESSION: CT of the head: Chronic atrophic and ischemic changes. CT of the cervical spine: Multilevel degenerative changes without acute abnormality. Electronically Signed   By: Inez Catalina M.D.   On: 12/25/2021 00:51   CT Cervical Spine Wo Contrast  Result Date: 12/25/2021 CLINICAL DATA:  Un witnessed fall with headaches and neck pain, initial encounter EXAM: CT HEAD WITHOUT  CONTRAST CT CERVICAL SPINE WITHOUT CONTRAST TECHNIQUE: Multidetector CT imaging of the head and cervical spine was performed following the standard protocol without intravenous contrast. Multiplanar CT image reconstructions of the cervical spine were also generated. RADIATION DOSE REDUCTION: This exam was performed according to the departmental dose-optimization program which includes automated exposure control, adjustment of the mA and/or kV according to patient size and/or use of iterative reconstruction technique. COMPARISON:  None Available. FINDINGS: CT HEAD FINDINGS Brain: No evidence of acute infarction, hemorrhage, hydrocephalus, extra-axial collection or mass lesion/mass effect. Chronic atrophic and ischemic changes are noted. Findings of prior left MCA infarct are noted with encephalomalacia. Vascular: No hyperdense vessel or unexpected calcification. Skull: Normal. Negative for fracture or focal lesion. Sinuses/Orbits: No acute finding. Other: None. CT CERVICAL SPINE FINDINGS Alignment: Within normal limits. Skull base and vertebrae: 7 cervical segments are well visualized. Vertebral body height is well maintained. Multilevel osteophytic changes and facet hypertrophic changes are noted. No acute fracture or acute facet abnormality is noted. Right jugular dialysis catheter is noted. Soft tissues and spinal canal: Surrounding soft tissue structures are within normal limits. No hematoma or lymphadenopathy is seen. Upper chest: Lung apices are within normal limits. Other: None IMPRESSION: CT of the head: Chronic atrophic and ischemic changes. CT of the cervical spine: Multilevel degenerative changes without acute abnormality. Electronically Signed   By: Inez Catalina M.D.   On: 12/25/2021 00:51     Assessment/Plan There are no diagnoses linked to this encounter.   Hortencia Pilar, MD  12/27/2021 4:22 PM

## 2021-12-28 ENCOUNTER — Other Ambulatory Visit (INDEPENDENT_AMBULATORY_CARE_PROVIDER_SITE_OTHER): Payer: Self-pay | Admitting: Nurse Practitioner

## 2021-12-28 ENCOUNTER — Other Ambulatory Visit (INDEPENDENT_AMBULATORY_CARE_PROVIDER_SITE_OTHER): Payer: Medicaid Other

## 2021-12-28 ENCOUNTER — Encounter (INDEPENDENT_AMBULATORY_CARE_PROVIDER_SITE_OTHER): Payer: 59 | Admitting: Vascular Surgery

## 2021-12-28 ENCOUNTER — Encounter (INDEPENDENT_AMBULATORY_CARE_PROVIDER_SITE_OTHER): Payer: Medicaid Other

## 2021-12-28 DIAGNOSIS — E119 Type 2 diabetes mellitus without complications: Secondary | ICD-10-CM

## 2021-12-28 DIAGNOSIS — K219 Gastro-esophageal reflux disease without esophagitis: Secondary | ICD-10-CM

## 2021-12-28 DIAGNOSIS — N186 End stage renal disease: Secondary | ICD-10-CM

## 2021-12-28 DIAGNOSIS — I10 Essential (primary) hypertension: Secondary | ICD-10-CM

## 2021-12-28 DIAGNOSIS — N183 Chronic kidney disease, stage 3 unspecified: Secondary | ICD-10-CM

## 2022-01-07 ENCOUNTER — Telehealth: Payer: Self-pay | Admitting: Oncology

## 2022-01-07 NOTE — Telephone Encounter (Signed)
Message x 2 left with Olivia Mackie at [facility] to please call back so that we can get patient scheduled for a virtual visit with Dr. Janese Banks.

## 2022-02-17 ENCOUNTER — Encounter: Payer: Self-pay | Admitting: Emergency Medicine

## 2022-02-17 ENCOUNTER — Emergency Department
Admission: EM | Admit: 2022-02-17 | Discharge: 2022-02-17 | Disposition: A | Discharge status: Home / Self Care | Payer: 59 | Attending: Emergency Medicine | Admitting: Emergency Medicine

## 2022-02-17 ENCOUNTER — Emergency Department: Payer: 59

## 2022-02-17 DIAGNOSIS — I130 Hypertensive heart and chronic kidney disease with heart failure and stage 1 through stage 4 chronic kidney disease, or unspecified chronic kidney disease: Secondary | ICD-10-CM | POA: Insufficient documentation

## 2022-02-17 DIAGNOSIS — I509 Heart failure, unspecified: Secondary | ICD-10-CM | POA: Diagnosis not present

## 2022-02-17 DIAGNOSIS — Y92009 Unspecified place in unspecified non-institutional (private) residence as the place of occurrence of the external cause: Secondary | ICD-10-CM | POA: Diagnosis not present

## 2022-02-17 DIAGNOSIS — W19XXXA Unspecified fall, initial encounter: Secondary | ICD-10-CM | POA: Diagnosis not present

## 2022-02-17 DIAGNOSIS — Z992 Dependence on renal dialysis: Secondary | ICD-10-CM | POA: Insufficient documentation

## 2022-02-17 DIAGNOSIS — N189 Chronic kidney disease, unspecified: Secondary | ICD-10-CM | POA: Insufficient documentation

## 2022-02-17 DIAGNOSIS — S81811A Laceration without foreign body, right lower leg, initial encounter: Secondary | ICD-10-CM | POA: Diagnosis present

## 2022-02-17 LAB — COMPREHENSIVE METABOLIC PANEL
ALT: 9 U/L (ref 0–44)
AST: 15 U/L (ref 15–41)
Albumin: 3.2 g/dL — ABNORMAL LOW (ref 3.5–5.0)
Alkaline Phosphatase: 59 U/L (ref 38–126)
Anion gap: 11 (ref 5–15)
BUN: 40 mg/dL — ABNORMAL HIGH (ref 8–23)
CO2: 22 mmol/L (ref 22–32)
Calcium: 8.5 mg/dL — ABNORMAL LOW (ref 8.9–10.3)
Chloride: 109 mmol/L (ref 98–111)
Creatinine, Ser: 5.98 mg/dL — ABNORMAL HIGH (ref 0.44–1.00)
GFR, Estimated: 7 mL/min — ABNORMAL LOW (ref >60)
Glucose, Bld: 189 mg/dL — ABNORMAL HIGH (ref 70–99)
Potassium: 5.4 mmol/L — ABNORMAL HIGH (ref 3.5–5.1)
Sodium: 142 mmol/L (ref 135–145)
Total Bilirubin: 0.6 mg/dL (ref 0.3–1.2)
Total Protein: 6.9 g/dL (ref 6.5–8.1)

## 2022-02-17 LAB — CBC WITH DIFFERENTIAL/PLATELET
Abs Immature Granulocytes: 0.03 10*3/uL (ref 0.00–0.07)
Basophils Absolute: 0 10*3/uL (ref 0.0–0.1)
Basophils Relative: 1 %
Eosinophils Absolute: 0.1 10*3/uL (ref 0.0–0.5)
Eosinophils Relative: 2 %
HCT: 32.6 % — ABNORMAL LOW (ref 36.0–46.0)
Hemoglobin: 10.2 g/dL — ABNORMAL LOW (ref 12.0–15.0)
Immature Granulocytes: 1 %
Lymphocytes Relative: 21 %
Lymphs Abs: 1.3 10*3/uL (ref 0.7–4.0)
MCH: 29.5 pg (ref 26.0–34.0)
MCHC: 31.3 g/dL (ref 30.0–36.0)
MCV: 94.2 fL (ref 80.0–100.0)
Monocytes Absolute: 0.9 10*3/uL (ref 0.1–1.0)
Monocytes Relative: 15 %
Neutro Abs: 3.8 10*3/uL (ref 1.7–7.7)
Neutrophils Relative %: 60 %
Platelets: 177 10*3/uL (ref 150–400)
RBC: 3.46 MIL/uL — ABNORMAL LOW (ref 3.87–5.11)
RDW: 15 % (ref 11.5–15.5)
WBC: 6.2 10*3/uL (ref 4.0–10.5)
nRBC: 0 % (ref 0.0–0.2)

## 2022-02-17 MED ORDER — LIDOCAINE-EPINEPHRINE (PF) 2 %-1:200000 IJ SOLN: 10.0000 mL | Freq: Once | INTRAMUSCULAR | Status: DC

## 2022-02-17 NOTE — Discharge Instructions (Signed)
Your exam, labs, and XRs are normal and reassuring. Keep the wound clean, dry, and covered. See your provider for staple removal in 7-10 days. Take your home meds as directed. Reschedule your missed dialysis treatment to tomorrow.

## 2022-02-17 NOTE — ED Notes (Signed)
Report called to [facility] at [facility]. EMs transport will be arranged. [facility] [employer] not able to arrange transport.

## 2022-02-17 NOTE — ED Triage Notes (Signed)
Patient to ED via [facility] for a mechanical fall. Patient c/o right hip pain and a laceration on right lower leg- bleeding controlled. Denies hitting head or LOC. Patient is on blood thinners. Does receive dialysis and suppose to receive it today. Aox4.

## 2022-02-17 NOTE — ED Provider Notes (Signed)
[facility] Emergency Department Provider Note     Event Date/Time   First MD Initiated Contact with Patient 02/17/22 1222     (approximate)   History   Fall   HPI  Jody Nelson is a 71 y.o. female with a history of heart failure, CKD on dialysis, CVA, hypertension, and chronic foot wound, presents to the ED from her facility.  Patient experienced a mechanical fall today, falling on her walker, and sustaining a leg laceration on the right.  Denies any head injury or LOC.  She does endorse some pain to the right hip as well as the right leg.   Physical Exam   Triage Vital Signs: ED Triage Vitals  Enc Vitals Group     BP 02/17/22 1101 (!) 147/59     Pulse Rate 02/17/22 1101 60     Resp 02/17/22 1101 18     Temp 02/17/22 1101 97.9 F (36.6 C)     Temp Source 02/17/22 1101 Oral     SpO2 02/17/22 1101 97 %     Weight --      Height --      Head Circumference --      Peak Flow --      Pain Score 02/17/22 1102 7     Pain Loc --      Pain Edu? --      Excl. in [place]? --     Most recent vital signs: Vitals:   02/17/22 1101 02/17/22 1646  BP: (!) 147/59 (!) 163/68  Pulse: 60 68  Resp: 18 18  Temp: 97.9 F (36.6 C)   SpO2: 97% 99%    General Awake, no distress.  NAD CV:  Good peripheral perfusion.  RESP:  Normal effort.  CTA ABD:  No distention.  Soft and nontender MSK:  Right lower extremity with a anterior shin laceration measuring approximately 7 cm.  The wound is exposing the subcutaneous fat pad actively and at this time.   ED Results / Procedures / Treatments   Labs (all labs ordered are listed, but only abnormal results are displayed) Labs Reviewed  CBC WITH DIFFERENTIAL/PLATELET - Abnormal; Notable for the following components:      Result Value   RBC 3.46 (*)    Hemoglobin 10.2 (*)    HCT 32.6 (*)    All other components within normal limits  COMPREHENSIVE METABOLIC PANEL - Abnormal; Notable for the following components:    Potassium 5.4 (*)    Glucose, Bld 189 (*)    BUN 40 (*)    Creatinine, Ser 5.98 (*)    Calcium 8.5 (*)    Albumin 3.2 (*)    GFR, Estimated 7 (*)    All other components within normal limits     EKG   RADIOLOGY  I personally viewed and evaluated these images as part of my medical decision making, as well as reviewing the written report by the radiologist.  ED Provider Interpretation: no acute fractures.  DG HIP UNILAT WITH PELVIS 2-3 VIEWS RIGHT  Result Date: 02/17/2022 CLINICAL DATA:  Right hip pain after fall EXAM: DG HIP (WITH OR WITHOUT PELVIS) 3V RIGHT COMPARISON:  None Available. FINDINGS: There is no evidence of hip fracture or dislocation. Moderate degenerative changes of the bilateral hips. Diffuse demineralization. Vascular calcifications. IMPRESSION: 1. No acute fracture or dislocation. Diffuse demineralization limits evaluation for nondisplaced fracture, if there is continued clinical concern recommend further evaluation with cross-sectional imaging. 2. Moderate degenerative  changes of the bilateral hips. Electronically Signed   By: Yetta Glassman M.D.   On: 02/17/2022 11:45   DG Tibia/Fibula Right  Result Date: 02/17/2022 CLINICAL DATA:  Fall with laceration. EXAM: RIGHT TIBIA AND FIBULA - 2 VIEW COMPARISON:  None Available. FINDINGS: There has been previous ORIF of the proximal tibia. An old, healed proximal fibular fracture is also noted. There are old pin tracks in the tibial diaphysis. No acute fracture is identified. The knee and ankle are located. Skin irregularity and soft tissue swelling are noted anteriorly in the lower leg likely related to the provided history of a laceration. IMPRESSION: No acute osseous abnormality. Electronically Signed   By: Logan Bores M.D.   On: 02/17/2022 11:34     PROCEDURES:  Critical Care performed: No  ..Laceration Repair  Date/Time: 02/17/2022 12:48 PM  Performed by: Melvenia Needles, PA-C Authorized by: Melvenia Needles, PA-C   Consent:    Consent obtained:  Verbal   Consent given by:  Patient   Risks, benefits, and alternatives were discussed: yes     Risks discussed:  Pain and poor wound healing Universal protocol:    Imaging studies available: yes     Site/side marked: yes     Patient identity confirmed:  Verbally with patient Anesthesia:    Anesthesia method:  Local infiltration   Local anesthetic:  Lidocaine 1% WITH epi Laceration details:    Location:  Leg   Leg location:  R lower leg   Length (cm):  7   Depth (mm):  5 Pre-procedure details:    Preparation:  Patient was prepped and draped in usual sterile fashion Exploration:    Limited defect created (wound extended): no     Hemostasis achieved with:  Direct pressure   Contaminated: no   Treatment:    Area cleansed with:  Povidone-iodine and saline   Amount of cleaning:  Standard   Irrigation solution:  Sterile saline   Irrigation volume:  10   Irrigation method:  Syringe   Debridement:  None   Undermining:  None   Scar revision: no   Skin repair:    Repair method:  Staples   Number of staples:  7 Approximation:    Approximation:  Close Repair type:    Repair type:  Simple Post-procedure details:    Dressing:  Non-adherent dressing and bulky dressing   Procedure completion:  Tolerated well, no immediate complications    MEDICATIONS ORDERED IN ED: Medications  lidocaine-EPINEPHrine (XYLOCAINE W/EPI) 2 %-1:200000 (PF) injection 10 mL (has no administration in time range)     IMPRESSION / MDM / ASSESSMENT AND PLAN / ED COURSE  I reviewed the triage vital signs and the nursing notes.                              Differential diagnosis includes, but is not limited to, sepsis, electrolyte abnormality, mechanical fall  Patient's presentation is most consistent with acute complicated illness / injury requiring diagnostic workup.  Patient to the ED for evaluation of injury sustained following mechanical fall.   She presents from her facility, noting she was transferring from her bed to her seated walker chair, when she slipped and fell.  The walker apparently cut her right lower leg.  She presents to the ED for evaluation of her symptoms but denies any head injury or LOC.  Evaluation with imaging of the hip and leg  did not reveal any acute fractures.  Monitor patient of the images reveals no bony injury.  Patient's right lower leg laceration is repaired using staples.  Patient's diagnosis is consistent with mechanical fall resulting in leg laceration. Patient will be discharged home with instructions to take her home medications. She is to reschedule her dialysis for tomorrow. Patient is to follow up with her primary provider as needed or otherwise directed. Patient is given ED precautions to return to the ED for any worsening or new symptoms.     FINAL CLINICAL IMPRESSION(S) / ED DIAGNOSES   Final diagnoses:  Fall in home, initial encounter  Leg laceration, right, initial encounter     Rx / DC Orders   ED Discharge Orders     None        Note:  This document was prepared using Dragon voice recognition software and may include unintentional dictation errors.    Melvenia Needles, PA-C 02/17/22 1709    Lavonia Drafts, MD 02/22/22 [phone]

## 2022-02-17 NOTE — ED Notes (Signed)
EMS TO ED TO TRANSPORT PT BACK TO FACILITY AT THIS TIME. PT CLEANED DUE TO INCONTINENCE OF URINE AND TRANSFERRED TO EMS STRETCHER. PER NOTES, FACILITY AND REPORT PROVIDED.

## 2022-02-17 NOTE — ED Notes (Signed)
Pt given dinner tray.

## 2022-02-17 NOTE — ED Notes (Signed)
Patient discharged to home per MD order. Patient in stable condition, and deemed medically cleared by ED provider for discharge. Discharge instructions reviewed with patient/family using "Teach Back"; verbalized understanding of medication education and administration, and information about follow-up care. Denies further concerns. ° °

## 2022-02-17 NOTE — ED Provider Triage Note (Signed)
Emergency Medicine Provider Triage Evaluation Note  Jody Nelson , a 71 y.o. female  was evaluated in triage.  Pt complains of fall this am. Patient with right hip pain, laceration right leg.  Review of Systems  Positive: + hip pain and lac to right lower extremity Negative: Denies h/a, dizziness, n/v  Physical Exam  BP (!) 147/59 (BP Location: Left Arm)   Pulse 60   Temp 97.9 F (36.6 C) (Oral)   Resp 18   SpO2 97%  Gen:   Awake, no distress   Resp:  Normal effort  MSK:   Right hip pain, laceration anterior mid tib-fib area.   Other:    Medical Decision Making  Medically screening exam initiated at 11:05 AM.  Appropriate orders placed.  Jody Nelson was informed that the remainder of the evaluation will be completed by another provider, this initial triage assessment does not replace that evaluation, and the importance of remaining in the ED until their evaluation is complete.     Johnn Hai, PA-C 02/17/22 1112

## 2022-03-02 ENCOUNTER — Emergency Department
Admission: EM | Admit: 2022-03-02 | Discharge: 2022-03-02 | Disposition: A | Discharge status: Skilled Nursing Facility | Payer: 59 | Attending: Emergency Medicine | Admitting: Emergency Medicine

## 2022-03-02 ENCOUNTER — Emergency Department: Payer: 59

## 2022-03-02 ENCOUNTER — Other Ambulatory Visit: Payer: Self-pay

## 2022-03-02 DIAGNOSIS — I120 Hypertensive chronic kidney disease with stage 5 chronic kidney disease or end stage renal disease: Secondary | ICD-10-CM | POA: Insufficient documentation

## 2022-03-02 DIAGNOSIS — R4781 Slurred speech: Secondary | ICD-10-CM | POA: Diagnosis not present

## 2022-03-02 DIAGNOSIS — Z79899 Other long term (current) drug therapy: Secondary | ICD-10-CM | POA: Insufficient documentation

## 2022-03-02 DIAGNOSIS — R479 Unspecified speech disturbances: Secondary | ICD-10-CM | POA: Diagnosis not present

## 2022-03-02 DIAGNOSIS — R2990 Unspecified symptoms and signs involving the nervous system: Secondary | ICD-10-CM

## 2022-03-02 DIAGNOSIS — R531 Weakness: Secondary | ICD-10-CM | POA: Diagnosis not present

## 2022-03-02 DIAGNOSIS — E1122 Type 2 diabetes mellitus with diabetic chronic kidney disease: Secondary | ICD-10-CM | POA: Diagnosis not present

## 2022-03-02 DIAGNOSIS — Z992 Dependence on renal dialysis: Secondary | ICD-10-CM | POA: Insufficient documentation

## 2022-03-02 DIAGNOSIS — R4701 Aphasia: Secondary | ICD-10-CM | POA: Insufficient documentation

## 2022-03-02 DIAGNOSIS — I639 Cerebral infarction, unspecified: Secondary | ICD-10-CM | POA: Insufficient documentation

## 2022-03-02 DIAGNOSIS — N186 End stage renal disease: Secondary | ICD-10-CM | POA: Insufficient documentation

## 2022-03-02 DIAGNOSIS — R209 Unspecified disturbances of skin sensation: Secondary | ICD-10-CM | POA: Insufficient documentation

## 2022-03-02 DIAGNOSIS — R471 Dysarthria and anarthria: Secondary | ICD-10-CM | POA: Insufficient documentation

## 2022-03-02 LAB — COMPREHENSIVE METABOLIC PANEL
ALT: 12 U/L (ref 0–44)
AST: 17 U/L (ref 15–41)
Albumin: 3.4 g/dL — ABNORMAL LOW (ref 3.5–5.0)
Alkaline Phosphatase: 72 U/L (ref 38–126)
Anion gap: 12 (ref 5–15)
BUN: 20 mg/dL (ref 8–23)
CO2: 25 mmol/L (ref 22–32)
Calcium: 8.7 mg/dL — ABNORMAL LOW (ref 8.9–10.3)
Chloride: 104 mmol/L (ref 98–111)
Creatinine, Ser: 4.06 mg/dL — ABNORMAL HIGH (ref 0.44–1.00)
GFR, Estimated: 11 mL/min — ABNORMAL LOW (ref >60)
Glucose, Bld: 193 mg/dL — ABNORMAL HIGH (ref 70–99)
Potassium: 4.3 mmol/L (ref 3.5–5.1)
Sodium: 141 mmol/L (ref 135–145)
Total Bilirubin: 0.9 mg/dL (ref 0.3–1.2)
Total Protein: 7.8 g/dL (ref 6.5–8.1)

## 2022-03-02 LAB — DIFFERENTIAL
Abs Immature Granulocytes: 0.01 10*3/uL (ref 0.00–0.07)
Basophils Absolute: 0 10*3/uL (ref 0.0–0.1)
Basophils Relative: 0 %
Eosinophils Absolute: 0.2 10*3/uL (ref 0.0–0.5)
Eosinophils Relative: 3 %
Immature Granulocytes: 0 %
Lymphocytes Relative: 18 %
Lymphs Abs: 1 10*3/uL (ref 0.7–4.0)
Monocytes Absolute: 0.6 10*3/uL (ref 0.1–1.0)
Monocytes Relative: 11 %
Neutro Abs: 3.7 10*3/uL (ref 1.7–7.7)
Neutrophils Relative %: 68 %

## 2022-03-02 LAB — CBC
HCT: 33 % — ABNORMAL LOW (ref 36.0–46.0)
Hemoglobin: 9.9 g/dL — ABNORMAL LOW (ref 12.0–15.0)
MCH: 29.2 pg (ref 26.0–34.0)
MCHC: 30 g/dL (ref 30.0–36.0)
MCV: 97.3 fL (ref 80.0–100.0)
Platelets: 225 10*3/uL (ref 150–400)
RBC: 3.39 MIL/uL — ABNORMAL LOW (ref 3.87–5.11)
RDW: 15 % (ref 11.5–15.5)
WBC: 5.4 10*3/uL (ref 4.0–10.5)
nRBC: 0 % (ref 0.0–0.2)

## 2022-03-02 LAB — APTT: aPTT: 47 s — ABNORMAL HIGH (ref 24–36)

## 2022-03-02 LAB — ETHANOL: Alcohol, Ethyl (B): <10 mg/dL (ref <10)

## 2022-03-02 LAB — CBG MONITORING, ED: Glucose-Capillary: 135 mg/dL — ABNORMAL HIGH (ref 70–99)

## 2022-03-02 LAB — PROTIME-INR
INR: 1.8 — ABNORMAL HIGH (ref 0.8–1.2)
Prothrombin Time: 20.9 seconds — ABNORMAL HIGH (ref 11.4–15.2)

## 2022-03-02 MED ORDER — LIDOCAINE 4 % EX CREA: TOPICAL_CREAM | Freq: Three times a day (TID) | CUTANEOUS | Status: DC | PRN

## 2022-03-02 MED ORDER — SODIUM CHLORIDE 0.9% FLUSH: 3.0000 mL | Freq: Once | INTRAVENOUS | Status: DC

## 2022-03-02 NOTE — ED Notes (Signed)
Pt able to stand with walker and ambulate to bathroom without difficulty and back to bed. WCTM.

## 2022-03-02 NOTE — ED Provider Notes (Signed)
[facility] Provider Note    Event Date/Time   First MD Initiated Contact with Patient 03/02/22 1226     (approximate)   History   Chief Complaint Aphasia   HPI  Jody Nelson is a 71 y.o. female with past medical history of hypertension, diabetes, stroke, hypercoagulable state on Eliquis, and ESRD on HD (MWF) who presents to the ED for aphasia.  Patient currently resides at [facility] and EMS was called there today due to concern for slurred speech.  Patient does have some residual slurred speech from prior stroke, however facility was apparently concerned that this was worse than usual this morning.  Symptoms were noted when patient woke up at 830 this morning, exact time of last known well is unclear.  Patient reports that she deals with some right-sided numbness and weakness following prior strokes as well as some slurred speech.  She does not think her speech is any worse than usual and denies any new numbness or weakness.  She states she has otherwise feeling well with no fevers, cough, chest pain, shortness of breath, nausea, vomiting, or diarrhea.  She last received a full run of dialysis yesterday.     Physical Exam   Triage Vital Signs: ED Triage Vitals  Enc Vitals Group     BP 03/02/22 1211 (!) 150/77     Pulse Rate 03/02/22 1211 62     Resp 03/02/22 1211 18     Temp 03/02/22 1211 98 F (36.7 C)     Temp src --      SpO2 03/02/22 1211 100 %     Weight --      Height --      Head Circumference --      Peak Flow --      Pain Score 03/02/22 1209 0     Pain Loc --      Pain Edu? --      Excl. in [place]? --     Most recent vital signs: Vitals:   03/02/22 1345 03/02/22 1500  BP:  (!) 153/66  Pulse:    Resp:  15  Temp:    SpO2: 100%     Constitutional: Alert and oriented. Eyes: Conjunctivae are normal. Head: Atraumatic. Nose: No congestion/rhinnorhea. Mouth/Throat: Mucous membranes are moist.  Cardiovascular: Normal rate, regular  rhythm. Grossly normal heart sounds.  2+ radial pulses bilaterally.  Right IJ TDC in place. Respiratory: Normal respiratory effort.  No retractions. Lungs CTAB. Gastrointestinal: Soft and nontender. No distention. Musculoskeletal: No lower extremity tenderness nor edema.  Neurologic:  Normal speech and language.  4 out of 5 strength in the right upper extremity and right lower extremity, 5 out of 5 strength in the left upper and lower extremities.    ED Results / Procedures / Treatments   Labs (all labs ordered are listed, but only abnormal results are displayed) Labs Reviewed  PROTIME-INR - Abnormal; Notable for the following components:      Result Value   Prothrombin Time 20.9 (*)    INR 1.8 (*)    All other components within normal limits  APTT - Abnormal; Notable for the following components:   aPTT 47 (*)    All other components within normal limits  CBC - Abnormal; Notable for the following components:   RBC 3.39 (*)    Hemoglobin 9.9 (*)    HCT 33.0 (*)    All other components within normal limits  COMPREHENSIVE METABOLIC PANEL -  Abnormal; Notable for the following components:   Glucose, Bld 193 (*)    Creatinine, Ser 4.06 (*)    Calcium 8.7 (*)    Albumin 3.4 (*)    GFR, Estimated 11 (*)    All other components within normal limits  CBG MONITORING, ED - Abnormal; Notable for the following components:   Glucose-Capillary 135 (*)    All other components within normal limits  DIFFERENTIAL  ETHANOL     EKG  ED ECG REPORT I, Blake Divine, the attending physician, personally viewed and interpreted this ECG.   Date: 03/02/2022  EKG Time: 12:38  Rate: 71  Rhythm: normal sinus rhythm  Axis: Normal  Intervals:none  ST&T Change: None  RADIOLOGY CT head reviewed and interpreted by me with no hemorrhage or midline shift.  PROCEDURES:  Critical Care performed: No  Procedures   MEDICATIONS ORDERED IN ED: Medications  sodium chloride flush (NS) 0.9 %  injection 3 mL (3 mLs Intravenous Not Given 03/02/22 1241)  lidocaine (LMX) 4 % cream (has no administration in time range)     IMPRESSION / MDM / ASSESSMENT AND PLAN / ED COURSE  I reviewed the triage vital signs and the nursing notes.                              71 y.o. female with past medical history of hypertension, diabetes, multiple strokes, hypercoagulable state on Eliquis, and ESRD on HD (MWF) who presents to the ED complaining of possible worsening of her chronic slurred speech.  Patient's presentation is most consistent with acute presentation with potential threat to life or bodily function.  Differential diagnosis includes, but is not limited to, stroke, TIA, electrolyte abnormality, hypoglycemia.  Patient nontoxic-appearing and in no acute distress, vital signs are unremarkable.  She appears to have chronic right-sided weakness and chronic dysarthria, staff at her facility were concerned that dysarthria was worse than usual, although patient denies this.  Code stroke was activated in triage and CT head negative for acute process.  Patient not a candidate for TNK and doubt large vessel occlusion, however she would benefit from rule out of acute stroke with MRI brain per Dr. Curly Shores.  We will also check EEG to ensure no seizure activity.  Per neurology, patient may be discharged home if further testing is unremarkable.  Labs are reassuring and consistent with known ESRD, no significant electrolyte abnormality or LFT abnormality.  CBC with stable chronic anemia, no significant leukocytosis noted.  MRI brain is negative for acute stroke, does show numerous chronic microhemorrhages.  This finding was reviewed with Dr. Curly Shores, who recommends continuing Eliquis given this is a chronic finding.  EEG results are pending at this time and patient turned over to oncoming provider.  If this is unremarkable, patient may be discharged home with outpatient neurology follow-up, however if it is  positive she may benefit from anticonvulsive medication.      FINAL CLINICAL IMPRESSION(S) / ED DIAGNOSES   Final diagnoses:  Dysarthria     Rx / DC Orders   ED Discharge Orders     None        Note:  This document was prepared using Dragon voice recognition software and may include unintentional dictation errors.   Blake Divine, MD 03/02/22 [phone]

## 2022-03-02 NOTE — ED Triage Notes (Addendum)
First Nurse: Pt here via [facility] from [facility] with possible TIA today. Pt has hx of 6 strokes. Pt had an episode of not being able to speak but that has resolved per ems.   124-cbg

## 2022-03-02 NOTE — Consult Note (Signed)
CODE STROKE- PHARMACY COMMUNICATION   Time CODE STROKE called/page received:1218  Time response to CODE STROKE was made (in person or via phone): in person  Time Stroke Kit retrieved from [place] (only if needed):n/a - patient on DOAC PTA, negative for stroke  Name of Provider/Nurse contacted:DR Jessup and dr Curly Shores  Past Medical History:  Diagnosis Date   Diabetes mellitus without complication ([place])    Hypertension    Renal disorder    Stroke [facility])    times 6   Prior to Admission medications   Medication Sig Start Date End Date Taking? Authorizing Provider  apixaban (ELIQUIS) 5 MG TABS tablet Take 1 tablet by mouth every 12 (twelve) hours. 08/16/18  Yes [provider]  ascorbic acid (VITAMIN C) 500 MG tablet Take 500 mg by mouth 2 (two) times daily. 01/14/22  Yes [provider]  atorvastatin (LIPITOR) 80 MG tablet Take 80 mg by mouth daily. 02/19/21  Yes [provider]  calcitRIOL (ROCALTROL) 0.25 MCG capsule Take 0.25 mcg by mouth daily. 10/22/21  Yes [provider]  Calcium Carb-Cholecalciferol (OYSTER SHELL CALCIUM W/D) 500-5 MG-MCG TABS Take 1 tablet by mouth daily as needed. 02/19/21  Yes [provider]  carvedilol (COREG) 25 MG tablet Take 1 tablet by mouth 2 (two) times daily. 02/19/21  Yes [provider]  Cholecalciferol (VITAMIN D3) 50 MCG (2000 UT) TABS Take 1 tablet by mouth daily. 01/14/22  Yes [provider]  ertapenem Colbert Ewing) 1 g injection  02/10/22  Yes [provider]  ferrous sulfate 324 (65 Fe) MG TBEC Take 1 tablet by mouth daily. 02/19/21  Yes [provider]  folic acid (FOLVITE) 1 MG tablet Take 1 mg by mouth daily. 02/19/21  Yes [provider]  furosemide (LASIX) 40 MG tablet Take 40 mg by mouth daily. 02/19/21  Yes [provider]  gabapentin (NEURONTIN) 100 MG capsule Take 2 capsules by mouth 2 (two) times daily. 02/19/21  Yes [provider]  insulin  glargine-yfgn (SEMGLEE) 100 UNIT/ML Pen Inject 10 Units into the skin at bedtime. 08/24/21  Yes [provider]  insulin lispro (HUMALOG) 100 UNIT/ML injection Inject 2-12 Units into the skin in the morning, at noon, and at bedtime. Per sliding scale   Yes [provider]  LAGEVRIO 200 MG CAPS capsule Take by mouth. 01/04/22  Yes [provider]  latanoprost (XALATAN) 0.005 % ophthalmic solution SMARTSIG:In Eye(s) 02/08/22  Yes [provider]  LIDOCAINE PAIN RELIEF 4 % 1 patch daily. 11/25/21  Yes [provider]  montelukast (SINGULAIR) 10 MG tablet Take 10 mg by mouth daily. 02/19/21  Yes [provider]  omeprazole (PRILOSEC) 10 MG capsule Take 10 mg by mouth daily. 02/25/22  Yes [provider]  torsemide (DEMADEX) 10 MG tablet Take 10 mg by mouth daily. 10/22/21  Yes [provider]  traZODone (DESYREL) 50 MG tablet Take 1 tablet by mouth at bedtime as needed. 08/22/20  Yes [provider]  acetaminophen (TYLENOL) 325 MG tablet Take 2 tablets by mouth every 8 (eight) hours as needed.    [provider]  diclofenac Sodium (VOLTAREN) 1 % GEL Apply topically. 10/14/21   [provider]  docusate sodium (COLACE) 100 MG capsule Take 100 mg by mouth 2 (two) times daily.    [provider]  doxycycline (MONODOX) 100 MG capsule Take 1 capsule by mouth every 12 (twelve) hours. Patient not taking: Reported on 03/02/2022 02/18/21   [provider]  fluconazole (DIFLUCAN) 150 MG tablet Take 150 mg by mouth once. 01/14/22   [provider]  insulin glargine (LANTUS) 100 UNIT/ML Solostar Pen Inject 10 Units into the skin at bedtime. Patient not taking: Reported on 10/27/2021 02/17/21   [provider]  ipratropium-albuterol (DUONEB) 0.5-2.5 (3) MG/3ML SOLN Take by nebulization. Patient not taking: Reported on 03/02/2022 05/18/21   [provider]  melatonin 3 MG TABS tablet  Take 2 tablets by mouth at bedtime. 03/16/18   [provider]  metroNIDAZOLE (FLAGYL) 500 MG tablet Take by mouth daily. 01/28/22   [provider]  metroNIDAZOLE (METROGEL) 1 % gel Apply topically. 02/03/22   [provider]  NYSTATIN powder Apply topically. 01/27/22   [provider]  omeprazole (PRILOSEC) 20 MG capsule Take 20 mg by mouth daily. 10/22/21   [provider]  traMADol (ULTRAM) 50 MG tablet Take by mouth. 10/23/21   [provider]   Keone Kamer Rodriguez-Guzman PharmD, BCPS 03/02/2022 4:52 PM

## 2022-03-02 NOTE — ED Triage Notes (Addendum)
Pt comes via EMS form [facility] with c/o slurred speech. Pt states the facility thinks hse was slurring with her words.   Pt is having some slurring with words at time.  Pt denies any dizzy or blurry vision. Pt has hx of 6 strokes in past. Unknown of any or what deficits.   Pt has right arm drift and facial droop.  Calling code stroke at this time.

## 2022-03-02 NOTE — ED Notes (Signed)
Pt asleep at this time, WCTM. VSS.

## 2022-03-02 NOTE — Procedures (Signed)
Had to wait for MRI to be done before eeg

## 2022-03-02 NOTE — ED Provider Notes (Signed)
Sent Dr. Curly Shores message requesting follow-up regarding EEG result.  I have thus far not seen a read available for the EEG.  Current plan of care is to discharge if EEG normal, but if epileptiform or concerning abnormality then potential admission and further treatment   Delman Kitten, MD 03/02/22 1657

## 2022-03-02 NOTE — ED Notes (Signed)
Tele-neuro cart in room 8 at this time.

## 2022-03-02 NOTE — Progress Notes (Signed)
Eeg done 

## 2022-03-02 NOTE — Consult Note (Addendum)
Neurology Consultation Reason for Consult: Code stroke Requesting Physician: Blake Divine  CC: Left thigh burning pain  History is obtained from: Patient and chart review  HPI: Jody Nelson is a 71 y.o. female with a past medical history significant for multiple cryptogenic left MCA territory strokes on anticoagulation due to multiple risk factors for hypercoagulable state (factor V Leiden heterozygous state, anticardiolipin antibody IgM positive, elevated homocystine, morbid obesity), diabetes C/B neuropathy, hypertension, hyperlipidemia, chronic kidney disease, reportedly mild sleep apnea not requiring CPAP, ESRD on intermittent dialysis since June 2023, biopsy-proven pulmonary sarcoidosis per [facility] records not on any immunosuppression.  She has residual right-sided weakness and right facial droop as well as some slurred speech and mild aphasia that all appears to be her baseline and is consistent with stroke visible on head CT.  Patient denies any new neurological symptoms other than noting she had some left outer thigh pain/burning in the distribution of the left lateral cutaneous femoral nerve that kept her from sleeping well last night  She notes that she is incontinent at night but otherwise dresses herself and bathes herself and toilets normally.  She denies any twitching or shaking movements of the right side or any other concern for seizure-like activity  LKW: Unclear, symptom discovery at 8:30 AM Thrombolytic given?: No, on Eliquis, last dose documented in [facility] as 8 PM on 10/23 but patient also reports she took it this morning, also out of the window IA performed?: No, not consistent with LVO Premorbid modified rankin scale: 3 per patient report, but not independently confirmed      3 - Moderate disability. Requires some help, but able to walk unassisted.     4 - Moderately severe disability. Unable to attend to own bodily needs without assistance, and unable to walk unassisted.  ROS:  All other review of systems was negative except as noted in the HPI.   Past Medical History:  Diagnosis Date   Diabetes mellitus without complication ([place])    Hypertension    Renal disorder    Stroke [facility])    times 6   Past Surgical History:  Procedure Laterality Date   CESAREAN SECTION     DIALYSIS/PERMA CATHETER INSERTION N/A 10/14/2021   Procedure: DIALYSIS/PERMA CATHETER INSERTION;  Surgeon: Algernon Huxley, MD;  Location: [facility];  Service: Cardiovascular;  Laterality: N/A;   Current Outpatient Medications  Medication Instructions   acetaminophen (TYLENOL) 325 MG tablet 2 tablets, Oral, Every 8 hours PRN   apixaban (ELIQUIS) 5 MG TABS tablet 1 tablet, Oral, Every 12 hours   atorvastatin (LIPITOR) 80 mg, Oral, Daily   calcitRIOL (ROCALTROL) 0.25 mcg, Oral, Daily   Calcium Carb-Cholecalciferol (OYSTER SHELL CALCIUM W/D) 500-5 MG-MCG TABS 1 tablet, Oral, Daily PRN   carvedilol (COREG) 25 MG tablet 1 tablet, Oral, 2 times daily   diclofenac Sodium (VOLTAREN) 1 % GEL Topical   docusate sodium (COLACE) 100 mg, Oral, 2 times daily   doxycycline (MONODOX) 100 MG capsule 1 capsule, Oral, Every 12 hours   ferrous sulfate 324 (65 Fe) MG TBEC 1 tablet, Oral, Daily   folic acid (FOLVITE) 1 mg, Oral, Daily   furosemide (LASIX) 40 mg, Oral, Daily   gabapentin (NEURONTIN) 100 MG capsule 2 capsules, Oral, 2 times daily   insulin glargine (LANTUS) 10 Units, Daily at bedtime   insulin glargine-yfgn (SEMGLEE) 100 UNIT/ML Pen Subcutaneous   insulin lispro (HUMALOG) 2-12 Units, Subcutaneous, 3 times daily, Per sliding scale   ipratropium-albuterol (DUONEB) 0.5-2.5 (3) MG/3ML  SOLN Nebulization   melatonin 3 MG TABS tablet 2 tablets, Oral, Daily at bedtime   montelukast (SINGULAIR) 10 mg, Oral, Daily   omeprazole (PRILOSEC) 20 mg, Oral, Daily   torsemide (DEMADEX) 10 mg, Oral, Daily   traMADol (ULTRAM) 50 MG tablet Oral   traZODone (DESYREL) 50 MG tablet 1 tablet, Oral, At bedtime PRN      Family History  Problem Relation Age of Onset   Diabetes Mother    Colon cancer Sister    Diabetes Sister    Cancer Maternal Uncle    Clotting disorder Paternal Grandmother    Colon cancer Paternal Grandfather    Social History:  reports that she has quit smoking. Her smoking use included cigarettes. She has never used smokeless tobacco. She reports that she does not drink alcohol and does not use drugs.   Exam: Current vital signs: BP (!) 150/77   Pulse 62   Temp 98 F (36.7 C)   Resp 18   SpO2 100%  Vital signs in last 24 hours: Temp:  [98 F (36.7 C)] 98 F (36.7 C) (10/24 1211) Pulse Rate:  [62] 62 (10/24 1211) Resp:  [18] 18 (10/24 1211) BP: (150)/(77) 150/77 (10/24 1211) SpO2:  [100 %] 100 % (10/24 1211)   Physical Exam  Constitutional: Appears well-groomed Psych: Affect appropriate to situation, pleasant and cooperative Eyes: No scleral injection HENT: No oropharyngeal obstruction.  MSK: no joint deformities.  Cardiovascular: Perfusing extremities well Respiratory: Effort normal, non-labored breathing GI: Soft.  No distension. There is no tenderness.  Skin: Warm dry and intact visible skin  Neuro: Mental Status: Patient is awake, alert, oriented to person, place, month, year, and situation. Patient is able to give a clear and coherent history, though she does have some occasional word finding difficulties.  For example she initially names her thumb as finger, but self corrects.  She has slight difficulty with repeating complex sentences.  She has slight difficulty following multistep commands but self corrects sequencing error. Cranial Nerves: II: Visual Fields are full to confrontation. Pupils are equal, round, and reactive to light.   III,IV, VI: EOMI without ptosis or diploplia.  V: Facial sensation is symmetric to light touch VII: Facial movement is with a mild right facial droop.  VIII: hearing is intact to voice X: Uvula elevates  symmetrically XI: Shoulder shrug is symmetric. XII: tongue is midline without atrophy or fasciculations.  Motor: Tone is normal. Bulk is normal.  5/5 throughout the bilateral upper extremities.  Lower extremities is slightly pain limited, 4/5 at the hip bilaterally, otherwise 5/5 Sensory: Sensation is symmetric to light touch in the arms and legs Cerebellar: Finger-nose is intact bilaterally, heel-to-shin is limited by her body habitus/chronic stiffness Gait:  Able to stand briefly for transfer from wheelchair to scanner, and reports her standing is at her baseline  NIHSS total 6 Score breakdown: One-point for right facial droop, 2 points for left leg weakness initially (resolved on later examination), one-point for right leg weakness, one-point for mild aphasia, one-point for baseline dysarthria Performed at 12:21 PM   I have reviewed labs in epic and the results pertinent to this consultation are:  Basic Metabolic Panel: Recent Labs  Lab 03/02/22 1215  NA 141  K 4.3  CL 104  CO2 25  GLUCOSE 193*  BUN 20  CREATININE 4.06*  CALCIUM 8.7*    CBC: Recent Labs  Lab 03/02/22 1215  WBC 5.4  NEUTROABS 3.7  HGB 9.9*  HCT 33.0*  MCV  97.3  PLT 225    Coagulation Studies: Recent Labs    03/02/22 1215  LABPROT 20.9*  INR 1.8*      I have reviewed the images obtained: Head CT personally reviewed, agree with radiology no acute intracranial process, chronic infarcts in the left MCA distribution   Impression: At this time, unclear reason for patient's presentation to the ED given that she denies having any symptoms other than her baseline symptoms.  Given her risk factors and the referring physician's concern (patient reports the facility physician was concerned about worsening speech), it is prudent to rule out a new stroke with MRI brain to confirm safety of continuing Eliquis, as well as obtaining an EEG to evaluate for the possibility of new onset post stroke  epilepsy.  Regarding her left thigh symptoms, these seem most consistent with meralgia paresthetica and could be followed up by outpatient neurology.  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 8 PM to 8 AM emergent questions or overnight urgent questions should be addressed to Teleneurology On-call or [facility] neurohospitalist; contact information can be found on AMION  Addendum:  MRI brain, personally reviewed, agree with radiology there is no acute intracranial process that she does have microhemorrhages 1. No acute intracranial pathology. 2. Remote infarcts in the left frontal lobe and parieto-occipital region and few small remote lacunar infarcts as above. 3. Numerous punctate chronic microhemorrhages in the supratentorial brain and brainstem, some of which are centrally distributed while others are peripheral. Findings may reflect hypertensive microhemorrhages; however, the distribution also raises the possibility of cerebral amyloid angiopathy. 4. Degenerative change at C3-C4 with at least moderate spinal canal stenosis and apparent flattening of the ventral cord surface. This can be further evaluated with dedicated cervical spine MRI as indicated.  On further review of [facility] records, she did have a  presentation in September 2022 for similar symptoms (new expressive aphasia on existing dysarthria).  Work-up was negative although did demonstrate again the multiple microhemorrhages similar to what is seen on MRI here based on report from [place].  Interestingly while she was hospitalized there she had an second event on 02/03/2021 after PT session.  EEG was negative and the etiology was favored to be TIA versus recrudescence, and Eliquis was continued after risk-benefit discussion with patient  Given the microhemorrhage history is established, and she suddenly has risk for further strokes, at this time still favor continuing anticoagulation.  I would also hold off on antiseizure medication at this time unless EEG is positive, but if this same event continues to recur with increased frequency she may benefit from a trial of antiseizure medications  Addendum 2:  EEG resulted negative

## 2022-03-02 NOTE — ED Notes (Signed)
Called to [facility] for transport to [facility] [employer] /rep:[email or web address].

## 2022-03-02 NOTE — ED Provider Notes (Signed)
Normal EEG  Neurology, Dr. Curly Shores, advises appropriate to discharge patient and ongoing outpatient follow-up with [facility] neurology recommended.  Patient resting comfortably at this time, she is agreeable with that plan.  Plans to return her to her care facility today.  Currently she is alert oriented, speech is clear, thought content appears quite normal, and she appears appropriate for discharge back to her care home   Delman Kitten, MD 03/02/22 1740

## 2022-03-02 NOTE — ED Notes (Signed)
Pt provided with meal tray.

## 2022-03-02 NOTE — ED Notes (Signed)
Code  stroke  called  to  [place]  per  sam  RN

## 2022-03-02 NOTE — Procedures (Signed)
Patient Name: Jody Nelson  MRN: [id number]  Epilepsy Attending: Lora Havens  Referring Physician/Provider: Estella Husk Date: 03/02/2022 Duration: 26.56 mins  Patient history: 71yo F with transient speech disturbance. EEG to evaluate for seizure.  Level of alertness: Awake, asleep  AEDs during EEG study: None  Technical aspects: This EEG study was done with scalp electrodes positioned according to the 10-20 International system of electrode placement. Electrical activity was reviewed with band pass filter of 1-70Hz , sensitivity of 7 uV/mm, display speed of 10mm/sec with a 60Hz  notched filter applied as appropriate. EEG data were recorded continuously and digitally stored.  Video monitoring was available and reviewed as appropriate.  Description: The posterior dominant rhythm consists of 8 Hz activity of moderate voltage (25-35 uV) seen predominantly in posterior head regions, symmetric and reactive to eye opening and eye closing. Sleep was characterized by vertex waves, sleep spindles (12 to 14 Hz), maximal frontocentral region. Hyperventilation and photic stimulation were not performed.     IMPRESSION: This study is within normal limits. No seizures or epileptiform discharges were seen throughout the recording.  A normal interictal EEG does not exclude the diagnosis of epilepsy.   Annaston Upham Barbra Sarks

## 2022-03-02 NOTE — Code Documentation (Signed)
Stroke Response Nurse Documentation Code Documentation  Jody Nelson is a 71 y.o. female arriving to [facility] via [facility] on 10/24/223 with past medical hx of HTN, DM, renal disorder, prior strokes. On Eliquis (apixaban) daily. Code stroke was activated by ED.   Patient from [facility] facility where she was LKW at 03/01/2022. Per triage RN, facility reported hearing patient speak clearly 03/01/2022. Patient was discovered to have slurred speech by maintenance worker at 0830 today and EMS was called.   Stroke team met patient in CT from triage. Labs drawn in triage. NIHSS 6, see documentation for details and code stroke times. Patient with left facial droop, bilateral leg weakness, Expressive aphasia , and dysarthria  on exam. The following imaging was completed:  CT Head. Patient is not a candidate for IV Thrombolytic due to recent Eliquis dose taken last night, per MD. Patient is not a candidate for IR due to LVO not suspected, per MD.   Care Plan: Q2 NIHSS/vital signs.   Bedside handoff with ED RN Jess.    Charise Carwin  Stroke Response RN

## 2022-03-02 NOTE — Progress Notes (Signed)
Telestroke Therapist, sports - Code Stroke Note-  1223- Stroke cart activation time- Pt is in CT 1227- On call neuro paged (Dr Curly Shores) 1235- Pt returned from [place], Dr Curly Shores saw pt in [place], now at bedside, reports pt is back to baseline, took Eliquis last PM, will not be a candidate for intervention at this time. MRS- 3

## 2022-03-02 NOTE — Discharge Instructions (Addendum)
Your evaluation here for causes of your symptoms including to evaluate and exclude stroke and seizure has been reassuring.  We strongly recommend you follow-up with [place] neurology as soon as possible, please call their office to schedule an appointment.  Return to the emergency room right away if your symptoms return, you have difficulty speaking that is new, new weakness, fevers, chest pain, difficulty breathing or other concerns arise.

## 2022-03-02 NOTE — ED Notes (Signed)
LKW per [facility] staff 0830am. Pt taken to CT scan 2 at this time. Secretary called and informed and pt to go to ED 8 afterwards.

## 2022-03-07 NOTE — Progress Notes (Unsigned)
MRN : [id number]  Jody Nelson is a 71 y.o. (02/09/1951) female who presents with chief complaint of check access.  History of Present Illness:   The patient is seen for follow up evaluation of dialysis access.  The patient recently started on dialysis and had their initial tunneled catheter placed 10/14/2021.  There have not been any previous accesses.    The patient is right handed.   Current access is via a catheter which is functioning poorly.  The patient has been told  the flow rates have been less than ideal.  There have not been any episodes of catheter infection.  The patient denies fever and chills while on dialysis.  No tenderness or drainage at the exit site.  No recent shortening of the patient's walking distance or new symptoms consistent with claudication.  No history of rest pain symptoms. No new ulcers or wounds of the lower extremities have occurred.  The patient denies amaurosis fugax or recent TIA symptoms. There are no recent neurological changes noted. There is no history of DVT, PE or superficial thrombophlebitis. No recent episodes of angina or shortness of breath documented.    No outpatient medications have been marked as taking for the 03/08/22 encounter (Appointment) with Delana Meyer, Dolores Lory, MD.    Past Medical History:  Diagnosis Date   Diabetes mellitus without complication ([place])    Hypertension    Renal disorder    Stroke [facility])    times 6    Past Surgical History:  Procedure Laterality Date   CESAREAN SECTION     DIALYSIS/PERMA CATHETER INSERTION N/A 10/14/2021   Procedure: DIALYSIS/PERMA CATHETER INSERTION;  Surgeon: Algernon Huxley, MD;  Location: [facility];  Service: Cardiovascular;  Laterality: N/A;    Social History Social History   Tobacco Use   Smoking status: Former    Types: Cigarettes   Smokeless tobacco: Never  Substance Use Topics   Alcohol use: Never   Drug use: Never    Family History Family  History  Problem Relation Age of Onset   Diabetes Mother    Colon cancer Sister    Diabetes Sister    Cancer Maternal Uncle    Clotting disorder Paternal Grandmother    Colon cancer Paternal Grandfather     Allergies  Allergen Reactions   Sulfamethoxazole-Trimethoprim     Other reaction(s): Kidney Disorder Other reaction(s): Kidney Disorder Hyperkalemia, AKI Hyperkalemia, AKI      REVIEW OF SYSTEMS (Negative unless checked)  Constitutional: [] Weight loss  [] Fever  [] Chills Cardiac: [] Chest pain   [] Chest pressure   [] Palpitations   [] Shortness of breath when laying flat   [] Shortness of breath with exertion. Vascular:  [] Pain in legs with walking   [] Pain in legs at rest  [] History of DVT   [] Phlebitis   [] Swelling in legs   [] Varicose veins   [] Non-healing ulcers Pulmonary:   [] Uses home oxygen   [] Productive cough   [] Hemoptysis   [] Wheeze  [] COPD   [] Asthma Neurologic:  [] Dizziness   [] Seizures   [] History of stroke   [] History of TIA  [] Aphasia   [] Vissual changes   [] Weakness or numbness in arm   [] Weakness or numbness in leg Musculoskeletal:   [] Joint swelling   [] Joint pain   [] Low back pain Hematologic:  [] Easy bruising  [] Easy bleeding   [] Hypercoagulable state   [] Anemic  Gastrointestinal:  [] Diarrhea   [] Vomiting  [x] Gastroesophageal reflux/heartburn   [] Difficulty swallowing. Genitourinary:  [x] Chronic kidney disease   [] Difficult urination  [] Frequent urination   [] Blood in urine Skin:  [] Rashes   [] Ulcers  Psychological:  [] History of anxiety   []  History of major depression.  Physical Examination  There were no vitals filed for this visit. There is no height or weight on file to calculate BMI. Gen: WD/WN, NAD Head: Jody Nelson/AT, No temporalis wasting.  Ear/Nose/Throat: Hearing grossly intact, nares w/o erythema or drainage Eyes: PER, EOMI, sclera nonicteric.  Neck: Supple, no gross masses or lesions.  No JVD.  Pulmonary:  Good air movement, no audible wheezing, no  use of accessory muscles.  Cardiac: RRR, precordium non-hyperdynamic. Vascular:   *** Vessel Right Left  Radial Palpable Palpable  Brachial Palpable Palpable  Gastrointestinal: soft, non-distended. No guarding/no peritoneal signs.  Musculoskeletal: M/S 5/5 throughout.  No deformity.  Neurologic: CN 2-12 intact. Pain and light touch intact in extremities.  Symmetrical.  Speech is fluent. Motor exam as listed above. Psychiatric: Judgment intact, Mood & affect appropriate for pt's clinical situation. Dermatologic: No rashes or ulcers noted.  No changes consistent with cellulitis.   CBC Lab Results  Component Value Date   WBC 5.4 03/02/2022   HGB 9.9 (L) 03/02/2022   HCT 33.0 (L) 03/02/2022   MCV 97.3 03/02/2022   PLT 225 03/02/2022    BMET    Component Value Date/Time   NA 141 03/02/2022 1215   K 4.3 03/02/2022 1215   CL 104 03/02/2022 1215   CO2 25 03/02/2022 1215   GLUCOSE 193 (H) 03/02/2022 1215   BUN 20 03/02/2022 1215   CREATININE 4.06 (H) 03/02/2022 1215   CALCIUM 8.7 (L) 03/02/2022 1215   GFRNONAA 11 (L) 03/02/2022 1215   CrCl cannot be calculated (Unknown ideal weight.).  COAG Lab Results  Component Value Date   INR 1.8 (H) 03/02/2022    Radiology EEG adult  Result Date: 03/02/2022 Lora Havens, MD     03/02/2022  5:31 PM Patient Name: Jody Nelson MRN: [id number] Epilepsy Attending: Lora Havens Referring Physician/Provider: Estella Husk Date: 03/02/2022 Duration: 26.56 mins Patient history: 71yo F with transient speech disturbance. EEG to evaluate for seizure. Level of alertness: Awake, asleep AEDs during EEG study: None Technical aspects: This EEG study was done with scalp electrodes positioned according to the 10-20 International system of electrode placement. Electrical activity was reviewed with band pass filter of 1-70Hz , sensitivity of 7 uV/mm, display speed of 46mm/sec with a 60Hz  notched filter applied as appropriate. EEG data were  recorded continuously and digitally stored.  Video monitoring was available and reviewed as appropriate. Description: The posterior dominant rhythm consists of 8 Hz activity of moderate voltage (25-35 uV) seen predominantly in posterior head regions, symmetric and reactive to eye opening and eye closing. Sleep was characterized by vertex waves, sleep spindles (12 to 14 Hz), maximal frontocentral region. Hyperventilation and photic stimulation were not performed.   IMPRESSION: This study is within normal limits. No seizures or epileptiform discharges were seen throughout the recording. A normal interictal EEG does not exclude the diagnosis of epilepsy. Lora Havens   MR BRAIN WO CONTRAST  Result Date: 03/02/2022 CLINICAL DATA:  Possible TIA, history of strokes. EXAM: MRI HEAD WITHOUT CONTRAST TECHNIQUE: Multiplanar, multiecho pulse sequences of the brain and surrounding structures were obtained without intravenous contrast. COMPARISON:  Same-day noncontrast CT head FINDINGS: Brain: There is no evidence of acute intracranial  hemorrhage, extra-axial fluid collection, or acute infarct. There is mild global parenchymal volume loss with prominence of the ventricular system and extra-axial CSF spaces. The ventricles are stable in size compared to the study from 12/25/2021. There are remote infarcts in the left frontal lobe and parieto-occipital region as well as small remote lacunar infarcts in the bilateral basal ganglia/corona radiata and left cerebellar hemisphere There are numerous punctate chronic microhemorrhages in the supratentorial brain and brainstem, some of which are centrally distributed while others are peripheral. Curvilinear chronic blood products are noted in the right occipital lobe consistent with prior hemorrhage. There is no solid mass lesion. There is no mass effect or midline shift. Vascular: Normal flow voids. Skull and upper cervical spine: There is no definite suspicious marrow signal  abnormality. There is degenerative change at C3-C4 with at least moderate spinal canal stenosis and apparent flattening of the ventral cord surface. Sinuses/Orbits: Paranasal sinuses are clear. Bilateral lens implants are in place. The globes and orbits are otherwise unremarkable. Other: None. IMPRESSION: 1. No acute intracranial pathology. 2. Remote infarcts in the left frontal lobe and parieto-occipital region and few small remote lacunar infarcts as above. 3. Numerous punctate chronic microhemorrhages in the supratentorial brain and brainstem, some of which are centrally distributed while others are peripheral. Findings may reflect hypertensive microhemorrhages; however, the distribution also raises the possibility of cerebral amyloid angiopathy. 4. Degenerative change at C3-C4 with at least moderate spinal canal stenosis and apparent flattening of the ventral cord surface. This can be further evaluated with dedicated cervical spine MRI as indicated. Electronically Signed   By: Valetta Mole M.D.   On: 03/02/2022 14:37   CT HEAD CODE STROKE WO CONTRAST  Result Date: 03/02/2022 CLINICAL DATA:  Code stroke.  Right-sided weakness. EXAM: CT HEAD WITHOUT CONTRAST TECHNIQUE: Contiguous axial images were obtained from the base of the skull through the vertex without intravenous contrast. RADIATION DOSE REDUCTION: This exam was performed according to the departmental dose-optimization program which includes automated exposure control, adjustment of the mA and/or kV according to patient size and/or use of iterative reconstruction technique. COMPARISON:  CT head 12/25/2021 FINDINGS: Brain: There is no definite evidence of acute infarct. There is no evidence of acute intracranial hemorrhage or extra-axial fluid collection. Remote infarcts in the left frontal lobe and parieto-occipital region with associated encephalomalacia and gliosis and slight ex vacuo dilatation of the left lateral ventricle are unchanged. Small  remote lacunar infarcts in the left basal ganglia and right corona radiata are stable. The ventricles are stable in size. Gray-white differentiation is otherwise preserved. There is no mass lesion.  There is no mass effect or midline shift. Vascular: No hyperdense vessel is seen. There is calcified plaque in the carotid siphons. Skull: Normal. Negative for fracture or focal lesion. Sinuses/Orbits: The imaged paranasal sinuses are clear. Bilateral lens implants are in place. The globes and orbits are otherwise unremarkable. Other: None. ASPECTS [facility] Stroke Program Early CT Score) - Ganglionic level infarction (caudate, lentiform nuclei, internal capsule, insula, M1-M3 cortex): 7 - Supraganglionic infarction (M4-M6 cortex): 3 Total score (0-10 with 10 being normal): 10 IMPRESSION: 1. No acute intracranial pathology. 2. Unchanged remote infarcts as above. Findings communicated to Dr. Curly Shores via Shea Evans at 12:35 pm. Electronically Signed   By: Valetta Mole M.D.   On: 03/02/2022 12:37   DG HIP UNILAT WITH PELVIS 2-3 VIEWS RIGHT  Result Date: 02/17/2022 CLINICAL DATA:  Right hip pain after fall EXAM: DG HIP (WITH OR WITHOUT PELVIS) 3V RIGHT  COMPARISON:  None Available. FINDINGS: There is no evidence of hip fracture or dislocation. Moderate degenerative changes of the bilateral hips. Diffuse demineralization. Vascular calcifications. IMPRESSION: 1. No acute fracture or dislocation. Diffuse demineralization limits evaluation for nondisplaced fracture, if there is continued clinical concern recommend further evaluation with cross-sectional imaging. 2. Moderate degenerative changes of the bilateral hips. Electronically Signed   By: Yetta Glassman M.D.   On: 02/17/2022 11:45   DG Tibia/Fibula Right  Result Date: 02/17/2022 CLINICAL DATA:  Fall with laceration. EXAM: RIGHT TIBIA AND FIBULA - 2 VIEW COMPARISON:  None Available. FINDINGS: There has been previous ORIF of the proximal tibia. An old, healed proximal  fibular fracture is also noted. There are old pin tracks in the tibial diaphysis. No acute fracture is identified. The knee and ankle are located. Skin irregularity and soft tissue swelling are noted anteriorly in the lower leg likely related to the provided history of a laceration. IMPRESSION: No acute osseous abnormality. Electronically Signed   By: Logan Bores M.D.   On: 02/17/2022 11:34     Assessment/Plan There are no diagnoses linked to this encounter.   Hortencia Pilar, MD  03/07/2022 10:37 AM

## 2022-03-08 ENCOUNTER — Ambulatory Visit (INDEPENDENT_AMBULATORY_CARE_PROVIDER_SITE_OTHER): Payer: 59

## 2022-03-08 ENCOUNTER — Ambulatory Visit (INDEPENDENT_AMBULATORY_CARE_PROVIDER_SITE_OTHER): Payer: 59 | Admitting: Vascular Surgery

## 2022-03-08 ENCOUNTER — Encounter (INDEPENDENT_AMBULATORY_CARE_PROVIDER_SITE_OTHER): Payer: Self-pay | Admitting: Vascular Surgery

## 2022-03-08 VITALS — BP 146/58 | HR 66 | Resp 16 | Wt 299.0 lb

## 2022-03-08 DIAGNOSIS — N186 End stage renal disease: Secondary | ICD-10-CM | POA: Diagnosis not present

## 2022-03-08 DIAGNOSIS — I10 Essential (primary) hypertension: Secondary | ICD-10-CM

## 2022-03-08 DIAGNOSIS — E119 Type 2 diabetes mellitus without complications: Secondary | ICD-10-CM

## 2022-03-08 DIAGNOSIS — Z794 Long term (current) use of insulin: Secondary | ICD-10-CM

## 2022-03-08 DIAGNOSIS — N183 Chronic kidney disease, stage 3 unspecified: Secondary | ICD-10-CM

## 2022-03-08 DIAGNOSIS — E785 Hyperlipidemia, unspecified: Secondary | ICD-10-CM | POA: Diagnosis not present

## 2022-03-25 ENCOUNTER — Telehealth (INDEPENDENT_AMBULATORY_CARE_PROVIDER_SITE_OTHER): Payer: Self-pay

## 2022-03-25 NOTE — Telephone Encounter (Signed)
I attempted to contact the patient regarding her left brachial axillary graft surgery and a message was left for a return call.

## 2022-03-30 ENCOUNTER — Other Ambulatory Visit: Payer: Self-pay | Admitting: Family Medicine

## 2022-03-30 DIAGNOSIS — M869 Osteomyelitis, unspecified: Secondary | ICD-10-CM

## 2022-04-08 ENCOUNTER — Other Ambulatory Visit (INDEPENDENT_AMBULATORY_CARE_PROVIDER_SITE_OTHER): Payer: Self-pay | Admitting: Nurse Practitioner

## 2022-04-08 ENCOUNTER — Ambulatory Visit
Admission: RE | Admit: 2022-04-08 | Discharge: 2022-04-08 | Disposition: A | Discharge status: Home / Self Care | Payer: 59 | Source: Ambulatory Visit | Attending: Family Medicine | Admitting: Family Medicine

## 2022-04-08 DIAGNOSIS — M869 Osteomyelitis, unspecified: Secondary | ICD-10-CM | POA: Diagnosis not present

## 2022-04-08 DIAGNOSIS — N186 End stage renal disease: Secondary | ICD-10-CM

## 2022-04-08 MED ORDER — GADOBUTROL 1 MMOL/ML IV SOLN
10.0000 mL | Freq: Once | INTRAVENOUS | Status: AC | PRN
  Administered 2022-04-08: 10 mL via INTRAVENOUS

## 2022-04-09 ENCOUNTER — Inpatient Hospital Stay
Admission: RE | Admit: 2022-04-09 | Discharge: 2022-04-09 | Disposition: A | Discharge status: Home / Self Care | Payer: 59 | Source: Ambulatory Visit

## 2022-04-09 NOTE — Patient Instructions (Signed)
Your procedure is scheduled on:04/16/2022  Report to the Registration Desk on the 1st floor of the [facility]. To find out your arrival time, please call [phone] between 1PM - 3PM on: 04/15/2022  If your arrival time is 6:00 am, do not arrive prior to that time as the [facility] entrance doors do not open until 6:00 am.  REMEMBER: Instructions that are not followed completely may result in serious medical risk, up to and including death; or upon the discretion of your surgeon and anesthesiologist your surgery may need to be rescheduled.  Do not eat food after midnight the night before surgery.  No gum chewing, lozengers or hard candies.    TAKE THESE MEDICATIONS THE MORNING OF SURGERY WITH A SIP OF WATER: Carvedilol (coreg)  Use inhalers on the day of surgery and bring to the hospital.  **Follow new guidelines for insulin and diabetes medications.**  Follow recommendations from Cardiologist, Pulmonologist or PCP regarding stopping , Eliquis,   One week prior to surgery: Stop Anti-inflammatories (NSAIDS) such as Advil, Aleve, Ibuprofen, Motrin, Naproxen, Naprosyn and Aspirin based products such as Excedrin, Goodys Powder, BC Powder. Stop ANY OVER THE COUNTER supplements until after surgery. You may however, continue to take Tylenol if needed for pain up until the day of surgery.  No Alcohol for 24 hours before or after surgery.  No Smoking including e-cigarettes for 24 hours prior to surgery.  No chewable tobacco products for at least 6 hours prior to surgery.  No nicotine patches on the day of surgery.  Do not use any "recreational" drugs for at least a week prior to your surgery.  Please be advised that the combination of cocaine and anesthesia may have negative outcomes, up to and including death. If you test positive for cocaine, your surgery will be cancelled.  On the morning of surgery brush your teeth with toothpaste and water, you may rinse your mouth with  mouthwash if you wish. Do not swallow any toothpaste or mouthwash.  Use CHG  or wipes as directed on instruction sheet.  Do not wear jewelry, make-up, hairpins, clips or nail polish.  Do not wear lotions, powders, or perfumes.   Do not shave body from the neck down 48 hours prior to surgery just in case you cut yourself which could leave a site for infection.  Also, freshly shaved skin may become irritated if using the CHG soap.  Contact lenses, hearing aids and dentures may not be worn into surgery.  Do not bring valuables to the hospital. [facility] is not responsible for any missing/lost belongings or valuables.    Bring your C-PAP to the hospital with you in case you may have to spend the night.   Notify your doctor if there is any change in your medical condition (cold, fever, infection).  Wear comfortable clothing (specific to your surgery type) to the hospital.  After surgery, you can help prevent lung complications by doing breathing exercises.  Take deep breaths and cough every 1-2 hours. Your doctor may order a device called an Incentive Spirometer to help you take deep breaths. When coughing or sneezing, hold a pillow firmly against your incision with both hands. This is called "splinting." Doing this helps protect your incision. It also decreases belly discomfort.  If you are being admitted to the hospital overnight, leave your suitcase in the car. After surgery it may be brought to your room.  If you are being discharged the day of surgery, you will not  be allowed to drive home. You will need a responsible adult (18 years or older) to drive you home and stay with you that night.   If you are taking public transportation, you will need to have a responsible adult (18 years or older) with you. Please confirm with your physician that it is acceptable to use public transportation.   Please call the [facility] Dept. at [phone] if you have any questions  about these instructions.  Surgery Visitation Policy:  Patients undergoing a surgery or procedure may have two family members or support persons with them as long as the person is not COVID-19 positive or experiencing its symptoms.   Inpatient Visitation:    Visiting hours are 7 a.m. to 8 p.m. Up to four visitors are allowed at one time in a patient room. The visitors may rotate out with other people during the day. One designated support person (adult) may remain overnight.  MASKING: Due to an increase in RSV rates and hospitalizations, in-patient care areas in which we serve newborns, infants and children, masks will be required for teammates and visitors.  Children ages 64 and under may not visit. This policy affects the following departments only:  [place] Postpartum area Mother Baby Unit Newborn nursery/Special care nursery  Other areas: Masks continue to be strongly recommended for [place] teammates, visitors and patients in all other areas. Visitation is not restricted outside of the units listed above.

## 2022-04-09 NOTE — Progress Notes (Addendum)
Called the patient and patient told me that she is  currently staying at [facility]. Patient was surprised when I told her that she has an upcoming procedure. So, I called [facility]  and request the medication list. The facility is not aware of the procedure as well. I advised the patient that we will be reaching out to them again on  Monday. The Facility number is [phone].

## 2022-04-12 ENCOUNTER — Encounter
Admission: RE | Admit: 2022-04-12 | Discharge: 2022-04-12 | Disposition: A | Discharge status: Home / Self Care | Payer: 59 | Source: Ambulatory Visit | Attending: Vascular Surgery | Admitting: Vascular Surgery

## 2022-04-12 DIAGNOSIS — Z01812 Encounter for preprocedural laboratory examination: Secondary | ICD-10-CM

## 2022-04-12 HISTORY — DX: Other chest pain: R07.89

## 2022-04-12 HISTORY — DX: Gastro-esophageal reflux disease without esophagitis: K21.9

## 2022-04-12 HISTORY — DX: Personal history of diseases of the blood and blood-forming organs and certain disorders involving the immune mechanism: Z86.2

## 2022-04-12 HISTORY — DX: Localized edema: R60.0

## 2022-04-12 HISTORY — DX: Type 2 diabetes mellitus without complications: E11.9

## 2022-04-12 HISTORY — DX: Presence of intraocular lens: Z96.1

## 2022-04-12 HISTORY — DX: History of falling: Z91.81

## 2022-04-12 HISTORY — DX: Dysarthria following cerebral infarction: I69.322

## 2022-04-12 HISTORY — DX: Sarcoidosis, unspecified: D86.9

## 2022-04-12 HISTORY — DX: Pneumonia, unspecified organism: J18.9

## 2022-04-12 HISTORY — DX: Weakness: R53.1

## 2022-04-12 HISTORY — DX: Obstructive sleep apnea (adult) (pediatric): G47.33

## 2022-04-12 HISTORY — DX: End stage renal disease: N18.6

## 2022-04-12 HISTORY — DX: Depression, unspecified: F32.A

## 2022-04-12 HISTORY — DX: Hallux valgus (acquired), right foot: M20.11

## 2022-04-12 HISTORY — DX: Hallux valgus (acquired), right foot: M20.12

## 2022-04-12 HISTORY — DX: Meralgia paresthetica, left lower limb: G57.12

## 2022-04-12 HISTORY — DX: Hyperlipidemia, unspecified: E78.5

## 2022-04-12 HISTORY — DX: Other cervical disc degeneration, unspecified cervical region: M50.30

## 2022-04-12 HISTORY — DX: Other forms of dyspnea: R06.09

## 2022-04-12 HISTORY — DX: Long term (current) use of anticoagulants: Z79.01

## 2022-04-12 HISTORY — DX: Vitamin D deficiency, unspecified: E55.9

## 2022-04-12 HISTORY — DX: Unspecified osteoarthritis, unspecified site: M19.90

## 2022-04-12 HISTORY — DX: Type 2 diabetes mellitus without complications: Z79.4

## 2022-04-12 HISTORY — DX: Dependence on renal dialysis: Z99.2

## 2022-04-12 NOTE — Pre-Procedure Instructions (Signed)
PAT call completed with patients daughter Bernell, Haynie (Daughter) , instructions faxed to [facility], were patient currently resides.

## 2022-04-12 NOTE — Patient Instructions (Addendum)
Your procedure is scheduled on: 04/16/22 - Friday Report to the Registration Desk on the 1st floor of the [facility]. To find out your arrival time, please call [phone] between 1PM - 3PM on: 04/15/22 - Thursday If your arrival time is 6:00 am, do not arrive prior to that time as the [facility] entrance doors do not open until 6:00 am.  REMEMBER: Instructions that are not followed completely may result in serious medical risk, up to and including death; or upon the discretion of your surgeon and anesthesiologist your surgery may need to be rescheduled.  Do not eat food or drink any liquids after midnight the night before surgery.  No gum chewing, lozengers or hard candies.  TAKE THESE MEDICATIONS THE MORNING OF SURGERY WITH A SIP OF WATER:  - carvedilol (COREG)  - gabapentin (NEURONTIN)  - omeprazole (PRILOSEC)   Hold apixaban (ELIQUIS) beginning 04/13/22, may resume with MD order.  Inject 1/2 of your bedtime dose of insulin glargine (LANTUS) on the night before your surgery.  HOLD morning dose of insulin lispro (HUMALOG) on the day of surgery, may resume taking with meals.  ipratropium-albuterol (DUONEB) do on the morning of surgery.  One week prior to surgery: Stop Anti-inflammatories (NSAIDS) such as Advil, Aleve, Ibuprofen, Motrin, Naproxen, Naprosyn and Aspirin based products such as Excedrin, Goodys Powder, BC Powder.  Stop ANY OVER THE COUNTER supplements until after surgery.  You may however, continue to take Tylenol if needed for pain up until the day of surgery.  No Alcohol for 24 hours before or after surgery.  No Smoking including e-cigarettes for 24 hours prior to surgery.  No chewable tobacco products for at least 6 hours prior to surgery.  No nicotine patches on the day of surgery.  Do not use any "recreational" drugs for at least a week prior to your surgery.  Please be advised that the combination of cocaine and anesthesia may have negative outcomes,  up to and including death. If you test positive for cocaine, your surgery will be cancelled.  On the morning of surgery brush your teeth with toothpaste and water, you may rinse your mouth with mouthwash if you wish. Do not swallow any toothpaste or mouthwash.  Use CHG Soap or wipes as directed on instruction sheet.  Do not wear jewelry, make-up, hairpins, clips or nail polish.  Do not wear lotions, powders, or perfumes.   Do not shave body from the neck down 48 hours prior to surgery just in case you cut yourself which could leave a site for infection.  Also, freshly shaved skin may become irritated if using the CHG soap.  Contact lenses, hearing aids and dentures may not be worn into surgery.  Do not bring valuables to the hospital. [facility] is not responsible for any missing/lost belongings or valuables.   Notify your doctor if there is any change in your medical condition (cold, fever, infection).  Wear comfortable clothing (specific to your surgery type) to the hospital.  After surgery, you can help prevent lung complications by doing breathing exercises.  Take deep breaths and cough every 1-2 hours. Your doctor may order a device called an Incentive Spirometer to help you take deep breaths. When coughing or sneezing, hold a pillow firmly against your incision with both hands. This is called "splinting." Doing this helps protect your incision. It also decreases belly discomfort.  If you are being admitted to the hospital overnight, leave your suitcase in the car. After surgery it may  be brought to your room.  If you are being discharged the day of surgery, you will not be allowed to drive home. You will need a responsible adult (18 years or older) to drive you home and stay with you that night.   If you are taking public transportation, you will need to have a responsible adult (18 years or older) with you. Please confirm with your physician that it is acceptable to use  public transportation.   Please call the [place] Dept. at [phone] if you have any questions about these instructions.  Surgery Visitation Policy:  Patients undergoing a surgery or procedure may have two family members or support persons with them as long as the person is not COVID-19 positive or experiencing its symptoms.   Inpatient Visitation:    Visiting hours are 7 a.m. to 8 p.m. Up to four visitors are allowed at one time in a patient room. The visitors may rotate out with other people during the day. One designated support person (adult) may remain overnight.  MASKING: Due to an increase in RSV rates and hospitalizations, in-patient care areas in which we serve newborns, infants and children, masks will be required for teammates and visitors.  Children ages 29 and under may not visit. This policy affects the following departments only:  [place] Postpartum area Mother Baby Unit Newborn nursery/Special care nursery  Other areas: Masks continue to be strongly recommended for [place] teammates, visitors and patients in all other areas. Visitation is not restricted outside of the units listed above.

## 2022-04-13 ENCOUNTER — Encounter: Payer: Self-pay | Admitting: Vascular Surgery

## 2022-04-14 ENCOUNTER — Encounter: Payer: Self-pay | Admitting: Vascular Surgery

## 2022-04-15 ENCOUNTER — Encounter: Payer: Self-pay | Admitting: Vascular Surgery

## 2022-04-15 MED ORDER — SODIUM CHLORIDE 0.9 % IV SOLN: INTRAVENOUS | Status: DC

## 2022-04-15 MED ORDER — CHLORHEXIDINE GLUCONATE 0.12 % MT SOLN: 15.0000 mL | Freq: Once | OROMUCOSAL | Status: AC

## 2022-04-15 MED ORDER — CHLORHEXIDINE GLUCONATE CLOTH 2 % EX PADS: 6.0000 | MEDICATED_PAD | Freq: Once | CUTANEOUS | Status: DC

## 2022-04-15 MED ORDER — CEFAZOLIN SODIUM-DEXTROSE 2-4 GM/100ML-% IV SOLN
2.0000 g | INTRAVENOUS | Status: AC
  Administered 2022-04-16: 2 g via INTRAVENOUS

## 2022-04-15 MED ORDER — ORAL CARE MOUTH RINSE: 15.0000 mL | Freq: Once | OROMUCOSAL | Status: AC

## 2022-04-15 NOTE — Progress Notes (Signed)
Perioperative Services  Pre-Admission/Anesthesia Testing Clinical Review  Date: 04/15/22  Patient Demographics:  Name: Jody Nelson DOB:   10/18/1950 MRN:   [id number]  Planned Surgical Procedure(s):    Case: [id number] Date/Time: 04/16/22 0715   Procedure: INSERTION OF ARTERIOVENOUS (AV) GORE-TEX GRAFT ARM (BRACHIAL AXILLARY) (Left)   Anesthesia type: General   Pre-op diagnosis: ESRD   Location: [facility] OR ROOM 08 / [place] [facility] AN[facility]   Surgeons: Katha Cabal, MD   NOTE: Available PAT nursing documentation and vital signs have been reviewed. Clinical nursing staff has updated patient's PMH/PSHx, current medication list, and drug allergies/intolerances to ensure comprehensive history available to assist in medical decision making as it pertains to the aforementioned surgical procedure and anticipated anesthetic course. Extensive review of available clinical information performed. [place] PMH and PSHx updated with any diagnoses/procedures that  may have been inadvertently omitted during her intake with the pre-admission testing department's nursing staff.  Clinical Discussion:  Jody Nelson is a 71 y.o. female who is submitted for pre-surgical anesthesia review and clearance prior to her undergoing the above procedure. Patient has never been a smoker. Pertinent PMH includes: atypical chest pain, HFpEF, multiple CVAs, HTN, HLD, T2DM, sarcoidosis, DOE, ESRD on dialysis (MWF), GERD (on daily PPI), GERD, anemia, homocystinemia, anticardiolipin antibody positive, heterozygous factor V Leiden mutation, OA, cervical DDD, BILATERAL lower extremity edema, high fall risk, chronic RIGHT-sided weakness, insomnia.  Patient  has a complex medical history as outlines above. She has been followed by nephrology, neurology, cardiology, and medicine in the past for the aforementioned diagnoses. Despite having a significant cardiovascular and neurological history, it appears as if patient has  essentially been lost to follow up with both outpatient cardiology ([place] 01/23/2019) and neurology (08/16/2018); notes reviewed.   Patient underwent diagnostic LEFT heart catheterization on 08/30/2008 revealing a normal left ventricular systolic function with an EF of >60%.  LVEDP normal at 18 mmHg.  Coronary anatomy noted to be normal with no evidence of obstructive CAD.  Patient has had multiple cryptogenic stokes in the past leaving her with chronic residual RIGHT sided weakness and dysarthria as late effects. CVA history as follows:   CT imaging of the brain performed on 10/11/2012 revealed tiny old lacunar infarct in the head of the caudate nucleus on the LEFT.  MRI imaging of the brain performed the next day (10/12/2012) revealed multiple acute LEFT hemispheric infarcts.  MRI of the brain performed on 06/26/2014 revealed an acute RIGHT posterior frontal lobe infarct involving both the Dontarious Schaum and white matter.  MRI imaging of the brain performed on 07/29/2015 revealed 2 foci of diffusion restriction in the LEFT parietal lobe consistent with acute infarction.  MRI imaging of the brain performed on 07/24/2017 revealed a punctate focus of acute ischemia in the medial LEFT temporal lobe.  Most recent TTE was performed on 01/17/2019 revealing a normal left ventricular systolic function with an EF of >55%.  There was mild LVH.  GLS -18.1%.  There was trivial pulmonic valve regurgitation.  All transvalvular gradients were noted to be normal with no evidence suggestive of valvular stenosis.  Patient recently seen in the ED on 03/02/2022 with complaints of worsening dysarthria that began when patient awakened from sleep.  No new neurological symptoms as far as numbness or extremity weakness appreciated by patient.  Patient did exhibit signs consistent with old stroke syndrome (unilateral weakness and dysarthria".  Patient felt as if her symptoms were stable and at baseline.  She denied any chest pain,  shortness breath, PND, orthopnea, palpitations, significant peripheral edema, vertiginous symptoms, or presyncope/syncope.  Labs revealed a mild anemia with hemoglobin of 9.9 g/dL and hematocrit of 33.0%.  No leukocytosis.  Chemistries were reassuring for patient undergoing hemodialysis.  BUN 20 and creatinine 4.06 mg/dL.  Sodium and potassium normal.  CT and MRI imaging of the head revealed no evidence of acute infarct; mention made of previously described remote infarctions.  Given her history, patient was seen in consult by neurology.  EEG was performed  and found to be normal, revealing no evidence of seizures or electrical discharges. Patient ultimately discharged back to her SNF with plans for outpatient follow-up with neurology.  To date, patient has not followed up with neurologist as an outpatient.  Patient also with a history of hypercoagulability.  Patient underwent hypercoagulable workup on 07/18/2018 at which time she was found to have factor V Leiden mutation (heterozygous), anticardiolipin antibody IgM (+), and an elevated homocystine level of 22 mol/L.  Beta-2 glycoproteins, lupus anticoagulant, and prothrombin gene mutation were all found to be negative.  Patient is currently on apixaban that was initiated by neurology.  Patient is being followed by the [facility] for anemia of chronic renal disease.  Reached out to attending hematologist to discuss the aforementioned in the setting of upcoming vascular surgical procedure.  Per Dr. Janese Banks, MD, "heterozygosity for factor V Leiden does not play a role in CVAs, and is not typically used in setting by how long to give apixaban.  Patient does not have antiphospholipid syndrome.  IgM anticardiolipin antibody levels were < 40 (16 units), and therefore not significant.  Nothing to do from hematological standpoint for upcoming surgery.  She does not need to be seen and the cancer center for hypercoagulability".  Anae Hams is  scheduled for an INSERTION OF ARTERIOVENOUS (AV) GORE-TEX GRAFT ARM (BRACHIAL AXILLARY) (Left) on 04/16/2022 with Dr. Hortencia Pilar, MD.  Given patient's past medical history significant for cardiovascular diagnoses and multiple medical comorbidities, given the fact that patient has been lost to follow-up with both cardiology and neurology, presurgical clearance was sought from patient's primary care provider.  Per internal/family medicine, "patient is cleared to proceed with the planned surgical procedure at an overall MODERATE risk of experiencing significant perioperative complications".  Again, this patient is on daily apixaban therapy.  She has been instructed on recommendations from her vascular surgeon and PCP for holding her apixaban dose for 3 days prior to her procedure with plans to restart as soon as postoperative bleeding risk felt to be minimized by her primary attending surgeon.  The patient, and staff at her SNF, or where the patient's last dose of apixaban should be on 04/12/2022.  Patient denies previous perioperative complications with anesthesia in the past. In review of the available records, it is noted that patient underwent a general anesthetic course at [facility] (ASA III) in 04/2018 without documented complications.      03/08/2022    2:05 PM 03/02/2022    7:30 PM 03/02/2022    6:30 PM  Vitals with BMI  Weight 935 lbs    Systolic [phone]  Diastolic 58 71 70  Pulse 66 69     Providers/Specialists:   NOTE: Primary physician provider listed below. Patient may have been seen by APP or partner within same practice.   PROVIDER ROLE / SPECIALTY LAST OV  Schnier, Dolores Lory, MD Vascular Surgery (Surgeon) 03/08/2022  Hal Morales, DO Primary Care Provider ???  Feliz Beam, NP Cardiology 01/16/2019  Anthonette Legato, MD Nephrology 10/06/2021  Nyoka Lint, MD  Neurology 08/16/2018  Randa Evens, MD Hematology 10/27/2021    Allergies:  Sulfamethoxazole-trimethoprim  Current Home Medications:   No current facility-administered medications for this encounter.    acetaminophen (TYLENOL) 325 MG tablet   ACIDOPHILUS LACTOBACILLUS PO   apixaban (ELIQUIS) 5 MG TABS tablet   ascorbic acid (VITAMIN C) 500 MG tablet   atorvastatin (LIPITOR) 80 MG tablet   calcitRIOL (ROCALTROL) 0.25 MCG capsule   Calcium Carb-Cholecalciferol (OYSTER SHELL CALCIUM W/D) 500-5 MG-MCG TABS   carvedilol (COREG) 25 MG tablet   Cholecalciferol (VITAMIN D3) 50 MCG (2000 UT) TABS   diclofenac Sodium (VOLTAREN) 1 % GEL   docusate sodium (COLACE) 100 MG capsule   doxycycline (MONODOX) 100 MG capsule   ertapenem (INVANZ) 1 g injection   ferrous sulfate 324 (65 Fe) MG TBEC   fluconazole (DIFLUCAN) 921 MG tablet   folic acid (FOLVITE) 1 MG tablet   furosemide (LASIX) 40 MG tablet   gabapentin (NEURONTIN) 100 MG capsule   HYDROcodone-acetaminophen (NORCO/VICODIN) 5-325 MG tablet   insulin glargine (LANTUS) 100 UNIT/ML Solostar Pen   insulin glargine-yfgn (SEMGLEE) 100 UNIT/ML Pen   insulin lispro (HUMALOG) 100 UNIT/ML injection   ipratropium-albuterol (DUONEB) 0.5-2.5 (3) MG/3ML SOLN   LAGEVRIO 200 MG CAPS capsule   latanoprost (XALATAN) 0.005 % ophthalmic solution   LIDOCAINE PAIN RELIEF 4 %   melatonin 3 MG TABS tablet   metroNIDAZOLE (FLAGYL) 500 MG tablet   metroNIDAZOLE (METROGEL) 1 % gel   montelukast (SINGULAIR) 10 MG tablet   Multiple Vitamins-Minerals (PRESERVISION AREDS) TABS   NYSTATIN powder   omeprazole (PRILOSEC) 10 MG capsule   omeprazole (PRILOSEC) 20 MG capsule   polyethylene glycol (MIRALAX / GLYCOLAX) 17 g packet   torsemide (DEMADEX) 10 MG tablet   traMADol (ULTRAM) 50 MG tablet   traZODone (DESYREL) 50 MG tablet   History:   Past Medical History:  Diagnosis Date   (HFpEF) heart failure with preserved ejection fraction (HCC)    a.) TTE 10/12/2012: EF >55%, triv PR, G1DD; b.) TTE 10/18/2012: EF >55%,  LVH, LAE, triv MT/TR/PR; c.) TTE 06/27/2014: EF >55%, mild MAC, G1DD; d.) TTE 03/18/2017: EF 60-65%, LAE, AoV sclerosis, G1DD; e.) TTE 07/13/2016: EF >55%, LVH, triv MR/TR; f.) TTE 01/17/2019: EF >55%, LVH, GLS -18.1%, triv PR   Anemia of chronic renal failure    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   At high risk for falls    Atypical chest pain    Bilateral lower extremity edema    Chronic right sided weakness    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   a.) MRI brain 02/27/2011: old lacunar infarct in RIGHT pons and medulla oblongata   Cryptogenic stroke ([place]) 10/11/2012   a.) CT brain 10/11/2012: tiny old lacunar infarct in head of caudate nucleus (left)   Cryptogenic stroke ([place]) 10/12/2012   a.) MRI brain 10/12/2012: multiple acute LEFT hemispheric infarcts   Cryptogenic stroke ([place]) 06/26/2014   a.) MRI brain 06/26/2014: acute RIGHT posterior frontal lobe infarct involving both and grey matter   Cryptogenic stroke ([place]) 07/29/2015   a.) MRI brain 07/29/2015: 2 foci of diffusion restriction in the LEFT parietal lobe consistent with acute infarction   Cryptogenic stroke ([place]) 07/24/2017   a.) MRI brain 07/24/2017: punctate focus of acute ischemia in the medial LEFT temporal lobe   DDD (degenerative disc disease), cervical    Depression    DOE (dyspnea on exertion)  Dysarthria as late effect of stroke    ESRD (end stage renal disease) on dialysis ([place])    a.) M-W-F   GERD (gastroesophageal reflux disease)    Hallux valgus, bilateral    Heterozygous factor V Leiden mutation ([place]) 07/18/2018   History of cardiac catheterization 08/30/2008   a.) LHC 08/30/2008: EF >60%, LVEDP 18 mmHg, normal coronaries.   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   a.) 07/18/2018 --> 22 mol/L   Hypertension    Insomnia    a.) melatonin + trazodone PRN   Long term current use of anticoagulant    a.) apixaban   Lymphedema of both lower extremities    Meralgia paresthetica, left lower limb    OSA (obstructive sleep apnea)    a.) does not require nocturnal PAP therapy   Osteoarthritis    Pneumonia    Pseudophakia of both eyes    Sarcoidosis    Secondary hyperparathyroidism of renal origin ([place])    Type 2 diabetes mellitus treated with insulin ([place])    Vitamin D deficiency    Past Surgical History:  Procedure Laterality Date   CESAREAN SECTION     DIALYSIS/PERMA CATHETER INSERTION N/A 10/14/2021   Procedure: DIALYSIS/PERMA CATHETER INSERTION;  Surgeon: Algernon Huxley, MD;  Location: [facility];  Service: Cardiovascular;  Laterality: N/A;   FRACTURE SURGERY     leg right fracture several surgeries, rods   Family History  Problem Relation Age of Onset   Diabetes Mother    Colon cancer Sister    Diabetes Sister    Cancer Maternal Uncle    Clotting disorder Paternal Grandmother    Colon cancer Paternal Grandfather    Social History   Tobacco Use   Smoking status: Former    Types: Cigarettes   Smokeless tobacco: Never  Substance Use Topics   Alcohol use: Never   Drug use: Never    Pertinent Clinical Results:  LABS: Labs reviewed: Acceptable for surgery.  No visits with results within 3 Day(s) from this visit.  Latest known visit with results is:  Admission on 03/02/2022, Discharged on 03/02/2022  Component Date Value Ref Range Status   Glucose-Capillary 03/02/2022 135 (H)  70 - 99 mg/dL Final   Glucose reference range applies only to samples taken after fasting for at least 8 hours.   Comment 1 03/02/2022 Notify RN   Final   Comment 2 03/02/2022 Document in Chart   Final   Prothrombin Time 03/02/2022 20.9 (H)  11.4 - 15.2 seconds Final   INR 03/02/2022 1.8 (H)  0.8 - 1.2 Final   Comment:  (NOTE) INR goal varies based on device and disease states. Performed at [facility], [address] [place], [place] [address]    aPTT 03/02/2022 47 (H)  24 - 36 seconds Final   Comment:        IF BASELINE aPTT IS ELEVATED, SUGGEST PATIENT RISK ASSESSMENT BE USED TO DETERMINE APPROPRIATE ANTICOAGULANT THERAPY. Performed at [facility], [address] [place], [place] [address]    WBC 03/02/2022 5.4  4.0 - 10.5 K/uL Final   RBC 03/02/2022 3.39 (L)  3.87 - 5.11 MIL/uL Final   Hemoglobin 03/02/2022 9.9 (L)  12.0 - 15.0 g/dL Final   HCT 03/02/2022 33.0 (  L)  36.0 - 46.0 % Final   MCV 03/02/2022 97.3  80.0 - 100.0 fL Final   MCH 03/02/2022 29.2  26.0 - 34.0 pg Final   MCHC 03/02/2022 30.0  30.0 - 36.0 g/dL Final   RDW 03/02/2022 15.0  11.5 - 15.5 % Final   Platelets 03/02/2022 225  150 - 400 K/uL Final   nRBC 03/02/2022 0.0  0.0 - 0.2 % Final   Performed at [facility], [address] [place], [place] [address]   Neutrophils Relative % 03/02/2022 68  % Final   Neutro Abs 03/02/2022 3.7  1.7 - 7.7 K/uL Final   Lymphocytes Relative 03/02/2022 18  % Final   Lymphs Abs 03/02/2022 1.0  0.7 - 4.0 K/uL Final   Monocytes Relative 03/02/2022 11  % Final   Monocytes Absolute 03/02/2022 0.6  0.1 - 1.0 K/uL Final   Eosinophils Relative 03/02/2022 3  % Final   Eosinophils Absolute 03/02/2022 0.2  0.0 - 0.5 K/uL Final   Basophils Relative 03/02/2022 0  % Final   Basophils Absolute 03/02/2022 0.0  0.0 - 0.1 K/uL Final   Immature Granulocytes 03/02/2022 0  % Final   Abs Immature Granulocytes 03/02/2022 0.01  0.00 - 0.07 K/uL Final   Performed at [facility], [place], [place] [address]   Sodium 03/02/2022 141  135 - 145 mmol/L Final   Potassium 03/02/2022 4.3  3.5 - 5.1 mmol/L Final   Chloride 03/02/2022 104  98 - 111 mmol/L Final   CO2 03/02/2022 25  22 - 32 mmol/L Final   Glucose, Bld 03/02/2022 193 (H)  70 - 99 mg/dL Final   Glucose  reference range applies only to samples taken after fasting for at least 8 hours.   BUN 03/02/2022 20  8 - 23 mg/dL Final   Creatinine, Ser 03/02/2022 4.06 (H)  0.44 - 1.00 mg/dL Final   Calcium 03/02/2022 8.7 (L)  8.9 - 10.3 mg/dL Final   Total Protein 03/02/2022 7.8  6.5 - 8.1 g/dL Final   Albumin 03/02/2022 3.4 (L)  3.5 - 5.0 g/dL Final   AST 03/02/2022 17  15 - 41 U/L Final   ALT 03/02/2022 12  0 - 44 U/L Final   Alkaline Phosphatase 03/02/2022 72  38 - 126 U/L Final   Total Bilirubin 03/02/2022 0.9  0.3 - 1.2 mg/dL Final   GFR, Estimated 03/02/2022 11 (L)  >60 mL/min Final   Comment: (NOTE) Calculated using the CKD-EPI Creatinine Equation (2021)    Anion gap 03/02/2022 12  5 - 15 Final   Performed at [facility], [address] [place], [place] [address]   Alcohol, Ethyl (B) 03/02/2022 <10  <10 mg/dL Final   Comment: (NOTE) Lowest detectable limit for serum alcohol is 10 mg/dL.  For medical purposes only. Performed at [facility], [address] [place], [place] [address]     ECG: Date: 03/02/2022 Time ECG obtained: 1238 PM Rate: 71 bpm Rhythm: normal sinus Axis (leads I and aVF): Normal Intervals: PR 163 ms. QRS 90 ms. QTc 420 ms. ST segment and T wave changes: No evidence of acute ST segment elevation or depression Comparison: Similar to previous tracing obtained on 02/27/2021   IMAGING / PROCEDURES: MR BRAIN WO CONTRAST performed on 03/02/2022 No acute intracranial pathology. Remote infarcts in the left frontal lobe and parieto-occipital region and few small remote lacunar infarcts as above. Numerous punctate chronic microhemorrhages in the supratentorial brain and brainstem,  some of which are centrally distributed while others are peripheral. Findings may reflect hypertensive microhemorrhages; however, the distribution also raises the possibility of cerebral amyloid angiopathy. Degenerative change at C3-C4 with at least moderate spinal canal  stenosis and apparent flattening of the ventral cord surface. This can be further evaluated with dedicated cervical spine MRI as indicated.   TRANSTHORACIC ECHOCARDIOGRAM performed on 01/17/2019 Normal left ventricular systolic function with an EF of >55% Mild concentric LVH Average LV GLS -18.1% Normal left ventricular diastolic Doppler parameters Normal right ventricular systolic function Trivial pulmonary valve regurgitation Normal transvalvular gradients; no valvular stenosis No pericardial effusion  Impression and Plan:  Jody Nelson has been referred for pre-anesthesia review and clearance prior to her undergoing the planned anesthetic and procedural courses. Available labs, pertinent testing, and imaging results were personally reviewed by me. This patient has been appropriately cleared by internal/family medicine with an overall MODERATE risk of significant perioperative complications.  Based on clinical review performed today (04/15/22), barring any significant acute changes in the patient's overall condition, it is anticipated that she will be able to proceed with the planned surgical intervention. Any acute changes in clinical condition may necessitate her procedure being postponed and/or cancelled. Patient will meet with anesthesia team (MD and/or CRNA) on the day of her procedure for preoperative evaluation/assessment. Questions regarding anesthetic course will be fielded at that time.   Pre-surgical instructions were reviewed with the patient during her PAT appointment and questions were fielded by PAT clinical staff. Patient was advised that if any questions or concerns arise prior to her procedure then she should return a call to PAT and/or her surgeon's office to discuss.  Honor Loh, MSN, APRN, FNP-C, CEN [facility]  Peri-operative Services Nurse Practitioner Phone: [phone] Fax: [phone] 04/15/22 3:45 PM  NOTE: This note has been prepared  using Dragon dictation software. Despite my best ability to proofread, there is always the potential that unintentional transcriptional errors may still occur from this process.

## 2022-04-16 ENCOUNTER — Other Ambulatory Visit: Payer: Self-pay

## 2022-04-16 ENCOUNTER — Ambulatory Visit: Payer: 59 | Admitting: Urgent Care

## 2022-04-16 ENCOUNTER — Encounter
Admission: RE | Disposition: A | Discharge status: Home / Self Care | Payer: Self-pay | Source: Home / Self Care | Attending: Vascular Surgery

## 2022-04-16 ENCOUNTER — Ambulatory Visit
Admission: RE | Admit: 2022-04-16 | Discharge: 2022-04-16 | Disposition: A | Discharge status: Home / Self Care | Payer: 59 | Attending: Vascular Surgery | Admitting: Vascular Surgery

## 2022-04-16 ENCOUNTER — Encounter: Payer: Self-pay | Admitting: Vascular Surgery

## 2022-04-16 DIAGNOSIS — Z87891 Personal history of nicotine dependence: Secondary | ICD-10-CM | POA: Insufficient documentation

## 2022-04-16 DIAGNOSIS — I132 Hypertensive heart and chronic kidney disease with heart failure and with stage 5 chronic kidney disease, or end stage renal disease: Secondary | ICD-10-CM | POA: Insufficient documentation

## 2022-04-16 DIAGNOSIS — I120 Hypertensive chronic kidney disease with stage 5 chronic kidney disease or end stage renal disease: Secondary | ICD-10-CM

## 2022-04-16 DIAGNOSIS — Z95828 Presence of other vascular implants and grafts: Secondary | ICD-10-CM

## 2022-04-16 DIAGNOSIS — D6851 Activated protein C resistance: Secondary | ICD-10-CM | POA: Insufficient documentation

## 2022-04-16 DIAGNOSIS — E785 Hyperlipidemia, unspecified: Secondary | ICD-10-CM | POA: Insufficient documentation

## 2022-04-16 DIAGNOSIS — I69351 Hemiplegia and hemiparesis following cerebral infarction affecting right dominant side: Secondary | ICD-10-CM | POA: Diagnosis not present

## 2022-04-16 DIAGNOSIS — K219 Gastro-esophageal reflux disease without esophagitis: Secondary | ICD-10-CM | POA: Diagnosis not present

## 2022-04-16 DIAGNOSIS — D869 Sarcoidosis, unspecified: Secondary | ICD-10-CM | POA: Diagnosis not present

## 2022-04-16 DIAGNOSIS — Z6841 Body Mass Index (BMI) 40.0 and over, adult: Secondary | ICD-10-CM | POA: Diagnosis not present

## 2022-04-16 DIAGNOSIS — I5032 Chronic diastolic (congestive) heart failure: Secondary | ICD-10-CM | POA: Insufficient documentation

## 2022-04-16 DIAGNOSIS — E1122 Type 2 diabetes mellitus with diabetic chronic kidney disease: Secondary | ICD-10-CM | POA: Insufficient documentation

## 2022-04-16 DIAGNOSIS — N186 End stage renal disease: Secondary | ICD-10-CM | POA: Diagnosis present

## 2022-04-16 DIAGNOSIS — G4733 Obstructive sleep apnea (adult) (pediatric): Secondary | ICD-10-CM | POA: Diagnosis not present

## 2022-04-16 DIAGNOSIS — E0590 Thyrotoxicosis, unspecified without thyrotoxic crisis or storm: Secondary | ICD-10-CM | POA: Insufficient documentation

## 2022-04-16 DIAGNOSIS — Z01812 Encounter for preprocedural laboratory examination: Secondary | ICD-10-CM

## 2022-04-16 DIAGNOSIS — Z794 Long term (current) use of insulin: Secondary | ICD-10-CM

## 2022-04-16 HISTORY — DX: Lymphedema, not elsewhere classified: I89.0

## 2022-04-16 HISTORY — DX: Unspecified diastolic (congestive) heart failure: I50.30

## 2022-04-16 HISTORY — DX: Insomnia, unspecified: G47.00

## 2022-04-16 HISTORY — DX: Anemia in chronic kidney disease: D63.1

## 2022-04-16 HISTORY — DX: Secondary hyperparathyroidism of renal origin: N25.81

## 2022-04-16 HISTORY — DX: Anemia in chronic kidney disease: N18.9

## 2022-04-16 HISTORY — PX: AV FISTULA PLACEMENT: SHX1204

## 2022-04-16 LAB — POCT I-STAT, CHEM 8
BUN: 31 mg/dL — ABNORMAL HIGH (ref 8–23)
Calcium, Ion: 1.13 mmol/L — ABNORMAL LOW (ref 1.15–1.40)
Chloride: 100 mmol/L (ref 98–111)
Creatinine, Ser: 6.4 mg/dL — ABNORMAL HIGH (ref 0.44–1.00)
Glucose, Bld: 195 mg/dL — ABNORMAL HIGH (ref 70–99)
HCT: 38 % (ref 36.0–46.0)
Hemoglobin: 12.9 g/dL (ref 12.0–15.0)
Potassium: 4.6 mmol/L (ref 3.5–5.1)
Sodium: 135 mmol/L (ref 135–145)
TCO2: 25 mmol/L (ref 22–32)

## 2022-04-16 LAB — GLUCOSE, CAPILLARY: Glucose-Capillary: 179 mg/dL — ABNORMAL HIGH (ref 70–99)

## 2022-04-16 SURGERY — INSERTION OF ARTERIOVENOUS (AV) GORE-TEX GRAFT ARM
Anesthesia: General | Laterality: Left

## 2022-04-16 MED ORDER — MIDAZOLAM HCL 2 MG/2ML IJ SOLN
INTRAMUSCULAR | Status: DC | PRN
  Administered 2022-04-16 (×2): 1 mg via INTRAVENOUS

## 2022-04-16 MED ORDER — MIDAZOLAM HCL 2 MG/2ML IJ SOLN
INTRAMUSCULAR | Status: AC
  Filled 2022-04-16: qty 2

## 2022-04-16 MED ORDER — 0.9 % SODIUM CHLORIDE (POUR BTL) OPTIME
TOPICAL | Status: DC | PRN
  Administered 2022-04-16: 20 mL

## 2022-04-16 MED ORDER — ACETAMINOPHEN 10 MG/ML IV SOLN
INTRAVENOUS | Status: DC | PRN
  Administered 2022-04-16: 1000 mg via INTRAVENOUS

## 2022-04-16 MED ORDER — BUPIVACAINE HCL (PF) 0.5 % IJ SOLN
INTRAMUSCULAR | Status: AC
  Filled 2022-04-16: qty 30

## 2022-04-16 MED ORDER — HEPARIN SODIUM (PORCINE) 5000 UNIT/ML IJ SOLN
INTRAMUSCULAR | Status: AC
  Filled 2022-04-16: qty 1

## 2022-04-16 MED ORDER — DROPERIDOL 2.5 MG/ML IJ SOLN
INTRAMUSCULAR | Status: AC
  Administered 2022-04-16: 0.625 mg via INTRAVENOUS
  Filled 2022-04-16: qty 2

## 2022-04-16 MED ORDER — DROPERIDOL 2.5 MG/ML IJ SOLN: 0.6250 mg | Freq: Once | INTRAMUSCULAR | Status: AC | PRN

## 2022-04-16 MED ORDER — OXYCODONE HCL 5 MG PO TABS: 5.0000 mg | ORAL_TABLET | Freq: Once | ORAL | Status: DC | PRN

## 2022-04-16 MED ORDER — PROMETHAZINE HCL 25 MG/ML IJ SOLN: 6.2500 mg | INTRAMUSCULAR | Status: DC | PRN

## 2022-04-16 MED ORDER — CEFAZOLIN SODIUM-DEXTROSE 2-4 GM/100ML-% IV SOLN
INTRAVENOUS | Status: AC
  Filled 2022-04-16: qty 100

## 2022-04-16 MED ORDER — PROPOFOL 1000 MG/100ML IV EMUL
INTRAVENOUS | Status: AC
  Filled 2022-04-16: qty 100

## 2022-04-16 MED ORDER — FENTANYL CITRATE (PF) 100 MCG/2ML IJ SOLN
INTRAMUSCULAR | Status: AC
  Filled 2022-04-16: qty 2

## 2022-04-16 MED ORDER — PROPOFOL 10 MG/ML IV BOLUS
INTRAVENOUS | Status: DC | PRN
  Administered 2022-04-16: 100 mg via INTRAVENOUS

## 2022-04-16 MED ORDER — FENTANYL CITRATE (PF) 100 MCG/2ML IJ SOLN
INTRAMUSCULAR | Status: DC | PRN
  Administered 2022-04-16: 25 ug via INTRAVENOUS
  Administered 2022-04-16: 50 ug via INTRAVENOUS
  Administered 2022-04-16: 25 ug via INTRAVENOUS

## 2022-04-16 MED ORDER — DEXAMETHASONE SODIUM PHOSPHATE 10 MG/ML IJ SOLN
INTRAMUSCULAR | Status: DC | PRN
  Administered 2022-04-16: 10 mg via INTRAVENOUS

## 2022-04-16 MED ORDER — HYDROCODONE-ACETAMINOPHEN 5-325 MG PO TABS: 1.0000 | ORAL_TABLET | Freq: Four times a day (QID) | ORAL | 0 refills | Status: DC | PRN

## 2022-04-16 MED ORDER — ACETAMINOPHEN 10 MG/ML IV SOLN
INTRAVENOUS | Status: AC
  Filled 2022-04-16: qty 100

## 2022-04-16 MED ORDER — ALBUTEROL SULFATE HFA 108 (90 BASE) MCG/ACT IN AERS
INHALATION_SPRAY | RESPIRATORY_TRACT | Status: DC | PRN
  Administered 2022-04-16: 4 via RESPIRATORY_TRACT

## 2022-04-16 MED ORDER — OXYCODONE HCL 5 MG/5ML PO SOLN: 5.0000 mg | Freq: Once | ORAL | Status: DC | PRN

## 2022-04-16 MED ORDER — ROCURONIUM BROMIDE 100 MG/10ML IV SOLN
INTRAVENOUS | Status: DC | PRN
  Administered 2022-04-16: 60 mg via INTRAVENOUS
  Administered 2022-04-16: 10 mg via INTRAVENOUS

## 2022-04-16 MED ORDER — SEVOFLURANE IN SOLN
RESPIRATORY_TRACT | Status: AC
  Filled 2022-04-16: qty 250

## 2022-04-16 MED ORDER — HEMOSTATIC AGENTS (NO CHARGE) OPTIME
TOPICAL | Status: DC | PRN
  Administered 2022-04-16: 1 via TOPICAL

## 2022-04-16 MED ORDER — PHENYLEPHRINE HCL (PRESSORS) 10 MG/ML IV SOLN
INTRAVENOUS | Status: DC | PRN
  Administered 2022-04-16 (×3): 80 ug via INTRAVENOUS

## 2022-04-16 MED ORDER — CHLORHEXIDINE GLUCONATE 0.12 % MT SOLN
OROMUCOSAL | Status: AC
  Administered 2022-04-16: 15 mL via OROMUCOSAL
  Filled 2022-04-16: qty 15

## 2022-04-16 MED ORDER — LIDOCAINE HCL (CARDIAC) PF 100 MG/5ML IV SOSY
PREFILLED_SYRINGE | INTRAVENOUS | Status: DC | PRN
  Administered 2022-04-16: 50 mg via INTRAVENOUS

## 2022-04-16 MED ORDER — BUPIVACAINE LIPOSOME 1.3 % IJ SUSP
INTRAMUSCULAR | Status: DC | PRN
  Administered 2022-04-16: 50 mL

## 2022-04-16 MED ORDER — HEPARIN SOD (PORK) LOCK FLUSH 10 UNIT/ML IV SOLN
INTRAVENOUS | Status: DC | PRN
  Administered 2022-04-16: 501 mL via INTRAVENOUS

## 2022-04-16 MED ORDER — ACETAMINOPHEN 10 MG/ML IV SOLN: 1000.0000 mg | Freq: Once | INTRAVENOUS | Status: DC | PRN

## 2022-04-16 MED ORDER — FENTANYL CITRATE (PF) 100 MCG/2ML IJ SOLN: 25.0000 ug | INTRAMUSCULAR | Status: DC | PRN

## 2022-04-16 MED ORDER — PAPAVERINE HCL 30 MG/ML IJ SOLN
INTRAMUSCULAR | Status: AC
  Filled 2022-04-16: qty 2

## 2022-04-16 MED ORDER — ONDANSETRON HCL 4 MG/2ML IJ SOLN
INTRAMUSCULAR | Status: DC | PRN
  Administered 2022-04-16: 4 mg via INTRAVENOUS

## 2022-04-16 MED ORDER — BUPIVACAINE LIPOSOME 1.3 % IJ SUSP
INTRAMUSCULAR | Status: AC
  Filled 2022-04-16: qty 20

## 2022-04-16 MED ORDER — EPHEDRINE SULFATE (PRESSORS) 50 MG/ML IJ SOLN: INTRAMUSCULAR | Status: DC | PRN

## 2022-04-16 MED ORDER — SUGAMMADEX SODIUM 200 MG/2ML IV SOLN
INTRAVENOUS | Status: DC | PRN
  Administered 2022-04-16: 200 mg via INTRAVENOUS

## 2022-04-16 SURGICAL SUPPLY — 61 items
ADH SKN CLS APL DERMABOND .7 (GAUZE/BANDAGES/DRESSINGS) ×1
APL PRP STRL LF DISP 70% ISPRP (MISCELLANEOUS) ×1
APPLIER CLIP 11 MED OPEN (CLIP)
APPLIER CLIP 9.375 SM OPEN (CLIP)
APR CLP MED 11 20 MLT OPN (CLIP)
APR CLP SM 9.3 20 MLT OPN (CLIP)
BAG DECANTER FOR FLEXI CONT (MISCELLANEOUS) ×1 IMPLANT
BLADE SURG SZ11 CARB STEEL (BLADE) ×1 IMPLANT
BOOT SUTURE AID YELLOW STND (SUTURE) ×1 IMPLANT
BRUSH SCRUB EZ  4% CHG (MISCELLANEOUS) ×1
BRUSH SCRUB EZ 4% CHG (MISCELLANEOUS) ×1 IMPLANT
CHLORAPREP W/TINT 26 (MISCELLANEOUS) ×1 IMPLANT
CLIP APPLIE 11 MED OPEN (CLIP) IMPLANT
CLIP APPLIE 9.375 SM OPEN (CLIP) IMPLANT
DERMABOND ADVANCED .7 DNX12 (GAUZE/BANDAGES/DRESSINGS) ×1 IMPLANT
DRESSING SURGICEL FIBRLLR 1X2 (HEMOSTASIS) ×1 IMPLANT
DRSG SURGICEL FIBRILLAR 1X2 (HEMOSTASIS) ×1
ELECT CAUTERY BLADE 6.4 (BLADE) ×1 IMPLANT
ELECT REM PT RETURN 9FT ADLT (ELECTROSURGICAL) ×1
ELECTRODE REM PT RTRN 9FT ADLT (ELECTROSURGICAL) ×1 IMPLANT
GLOVE BIO SURGEON STRL SZ7 (GLOVE) ×1 IMPLANT
GLOVE SURG SYN 8.0 (GLOVE) ×1 IMPLANT
GLOVE SURG SYN 8.0 PF PI (GLOVE) ×1 IMPLANT
GOWN STRL REUS W/ TWL LRG LVL3 (GOWN DISPOSABLE) ×2 IMPLANT
GOWN STRL REUS W/ TWL XL LVL3 (GOWN DISPOSABLE) ×1 IMPLANT
GOWN STRL REUS W/TWL LRG LVL3 (GOWN DISPOSABLE) ×2
GOWN STRL REUS W/TWL XL LVL3 (GOWN DISPOSABLE) ×1
GRAFT PROPATEN STD WALL 4 7X45 (Vascular Products) ×1 IMPLANT
IV NS 500ML (IV SOLUTION) ×1
IV NS 500ML BAXH (IV SOLUTION) ×1 IMPLANT
KIT TURNOVER KIT A (KITS) ×1 IMPLANT
LABEL OR SOLS (LABEL) ×1 IMPLANT
LOOP RED MAXI  1X406MM (MISCELLANEOUS) ×1
LOOP VESSEL MAXI 1X406 RED (MISCELLANEOUS) ×1 IMPLANT
LOOP VESSEL MINI 0.8X406 BLUE (MISCELLANEOUS) ×2 IMPLANT
LOOPS BLUE MINI 0.8X406MM (MISCELLANEOUS) ×2
MANIFOLD NEPTUNE II (INSTRUMENTS) ×1 IMPLANT
NDL FILTER BLUNT 18X1 1/2 (NEEDLE) ×1 IMPLANT
NEEDLE FILTER BLUNT 18X1 1/2 (NEEDLE) ×1 IMPLANT
NS IRRIG 500ML POUR BTL (IV SOLUTION) ×1 IMPLANT
PACK EXTREMITY ARMC (MISCELLANEOUS) ×1 IMPLANT
PAD PREP 24X41 OB/GYN DISP (PERSONAL CARE ITEMS) ×1 IMPLANT
STOCKINETTE 48X4 2 PLY STRL (GAUZE/BANDAGES/DRESSINGS) ×1 IMPLANT
STOCKINETTE STRL 4IN 9604848 (GAUZE/BANDAGES/DRESSINGS) ×1 IMPLANT
SUT GTX CV-6 30 (SUTURE) ×2 IMPLANT
SUT MNCRL+ 5-0 UNDYED PC-3 (SUTURE) ×1 IMPLANT
SUT MONOCRYL 5-0 (SUTURE) ×1
SUT PROLENE 6 0 BV (SUTURE) ×2 IMPLANT
SUT SILK 2 0 (SUTURE) ×1
SUT SILK 2 0 SH (SUTURE) ×1 IMPLANT
SUT SILK 2-0 18XBRD TIE 12 (SUTURE) ×1 IMPLANT
SUT SILK 3 0 (SUTURE) ×1
SUT SILK 3-0 18XBRD TIE 12 (SUTURE) ×1 IMPLANT
SUT SILK 4 0 (SUTURE) ×1
SUT SILK 4-0 18XBRD TIE 12 (SUTURE) ×1 IMPLANT
SUT VIC AB 3-0 SH 27 (SUTURE) ×3
SUT VIC AB 3-0 SH 27X BRD (SUTURE) ×2 IMPLANT
SYR 20ML LL LF (SYRINGE) ×1 IMPLANT
SYR 3ML LL SCALE MARK (SYRINGE) ×1 IMPLANT
TRAP FLUID SMOKE EVACUATOR (MISCELLANEOUS) ×1 IMPLANT
WATER STERILE IRR 500ML POUR (IV SOLUTION) ×1 IMPLANT

## 2022-04-16 NOTE — Anesthesia Preprocedure Evaluation (Addendum)
Anesthesia Evaluation  Patient identified by MRN, date of birth, ID band Patient awake  General Assessment Comment:somnolent  Reviewed: Allergy & Precautions, NPO status , Patient's Chart, lab work & pertinent test results  Airway Mallampati: III  TM Distance: >3 FB Neck ROM: full    Dental  (+) Chipped, Missing, Poor Dentition   Pulmonary sleep apnea , former smoker   Pulmonary exam normal        Cardiovascular Exercise Tolerance: Poor hypertension, +CHF and + DOE   + Peripheral Edema    Neuro/Psych  ED on 03/02/2022 with complaints of worsening dysarthria that began when patient awakened from sleep.  No new neurological symptoms as far as numbness or extremity weakness appreciated by patient.  Patient did exhibit signs consistent with old stroke syndrome (unilateral weakness and dysarthria".  Patient felt as if her symptoms were stable and at baseline. Work-up was negative for seizure or stroke and she was discharged with outpatient followup.    Cryptogenic stroke ([place]) 02/27/2011    a.) MRI brain 02/27/2011: old lacunar infarct in RIGHT pons and medulla oblongata  Cryptogenic stroke ([place]) 10/11/2012   a.) CT brain 10/11/2012: tiny old lacunar infarct in head of caudate nucleus (left)  Cryptogenic stroke ([place]) 10/12/2012   a.) MRI brain 10/12/2012: multiple acute LEFT hemispheric infarcts  Cryptogenic stroke ([place]) 06/26/2014   a.) MRI brain 06/26/2014: acute RIGHT posterior frontal lobe infarct involving both and grey matter  Cryptogenic stroke ([place]) 07/29/2015   a.) MRI brain 07/29/2015: 2 foci of diffusion restriction in the LEFT parietal lobe consistent with acute infarction  Cryptogenic stroke ([place]) 07/24/2017   a.) MRI brain 07/24/2017: punctate focus of acute ischemia in the medial LEFT   CVA (subjective R sided weakness. Pt did not have full ROM to lower extremities due to morbid obesity. No obvious weakness with  noted to ankle flexion and extension or RUE movements.)  negative psych ROS   GI/Hepatic negative GI ROS, Neg liver ROS,,,  Endo/Other  diabetes, Type 2    Renal/GU DialysisRenal disease     Musculoskeletal   Abdominal  (+) + obese  Peds  Hematology  (+) Blood dyscrasia, anemia   Anesthesia Other Findings Per Dr. Janese Banks, MD, "heterozygosity for factor V Leiden does not play a role in CVAs, and is not typically used in setting by how long to give apixaban.  Patient does not have antiphospholipid syndrome.  IgM anticardiolipin antibody levels were < 40 (16 units), and therefore not significant.  Nothing to do from hematological standpoint for upcoming surgery.  She does not need to be seen and the cancer center for hypercoagulabilit   Wounds on bilateral lower legs with right leg minimal tenderness to palpation. Dr. Josie Saunders examined. VSS. Pt denies fever/chills.   Sarcoidosis  Past Medical History: No date: (HFpEF) heart failure with preserved ejection fraction ([place])     Comment:  a.) TTE 10/12/2012: EF >55%, triv PR, G1DD; b.) TTE               10/18/2012: EF >55%, LVH, LAE, triv MT/TR/PR; c.) TTE               06/27/2014: EF >55%, mild MAC, G1DD; d.) TTE 03/18/2017:               EF 60-65%, LAE, AoV sclerosis, G1DD; e.) TTE 07/13/2016:               EF >55%, LVH, triv MR/TR; f.) TTE 01/17/2019: EF >55%,  LVH, GLS -18.1%, triv PR No date: Anemia of chronic renal failure 07/18/2018: Anticardiolipin antibody positive No date: At high risk for falls No date: Atypical chest pain No date: Bilateral lower extremity edema No date: Chronic right sided weakness 02/27/2011: Cryptogenic stroke [facility])     Comment:  a.) MRI brain 02/27/2011: old lacunar infarct in RIGHT               pons and medulla oblongata 10/11/2012: Cryptogenic stroke ([place])     Comment:  a.) CT brain 10/11/2012: tiny old lacunar infarct in               head of caudate nucleus (left) 10/12/2012:  Cryptogenic stroke ([place])     Comment:  a.) MRI brain 10/12/2012: multiple acute LEFT               hemispheric infarcts 06/26/2014: Cryptogenic stroke ([place])     Comment:  a.) MRI brain 06/26/2014: acute RIGHT posterior frontal               lobe infarct involving both and grey matter 07/29/2015: Cryptogenic stroke ([place])     Comment:  a.) MRI brain 07/29/2015: 2 foci of diffusion               restriction in the LEFT parietal lobe consistent with               acute infarction 07/24/2017: Cryptogenic stroke ([place])     Comment:  a.) MRI brain 07/24/2017: punctate focus of acute               ischemia in the medial LEFT temporal lobe No date: DDD (degenerative disc disease), cervical No date: Depression No date: DOE (dyspnea on exertion) No date: Dysarthria as late effect of stroke No date: ESRD (end stage renal disease) on dialysis ([place])     Comment:  a.) M-W-F No date: GERD (gastroesophageal reflux disease) No date: Hallux valgus, bilateral 07/18/2018: Heterozygous factor V Leiden mutation ([place]) 08/30/2008: History of cardiac catheterization     Comment:  a.) LHC 08/30/2008: EF >60%, LVEDP 18 mmHg, normal               coronaries. 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     Comment:  a.) 07/18/2018 --> 22 mol/L No date: Hypertension No date: Insomnia     Comment:  a.) melatonin + trazodone PRN No date: Long term current use of anticoagulant     Comment:  a.) apixaban No date: Lymphedema of both lower extremities No date: Meralgia paresthetica, left lower limb No date: OSA (obstructive sleep apnea)     Comment:  a.) does not require nocturnal PAP therapy No  date: Osteoarthritis No date: Pneumonia No date: Pseudophakia of both eyes No date: Sarcoidosis No date: Secondary hyperparathyroidism of renal origin ([place]) No date: Type 2 diabetes mellitus treated with insulin ([place]) No date: Vitamin D deficiency  Past Surgical History: No date: CESAREAN SECTION 10/14/2021: DIALYSIS/PERMA CATHETER INSERTION; N/A     Comment:  Procedure:  DIALYSIS/PERMA CATHETER INSERTION;  Surgeon:               Algernon Huxley, MD;  Location: [facility];                Service: Cardiovascular;  Laterality: N/A; No date: FRACTURE SURGERY     Comment:  leg right fracture several surgeries, rods     Reproductive/Obstetrics negative OB ROS                             Anesthesia Physical Anesthesia Plan  ASA: 4  Anesthesia Plan: General   Post-op Pain Management: Ofirmev IV (intra-op)* and Regional block*   Induction: Intravenous  PONV Risk Score and Plan: 3 and Ondansetron and Treatment may vary due to age or medical condition  Airway Management Planned: Oral ETT  Additional Equipment:   Intra-op Plan:   Post-operative Plan: Extubation in OR  Informed Consent: I have reviewed the patients History and Physical, chart, labs and discussed the procedure including the risks, benefits and alternatives for the proposed anesthesia with the patient or authorized representative who has indicated his/her understanding and acceptance.     Dental advisory given  Plan Discussed with: Anesthesiologist, CRNA and Surgeon  Anesthesia Plan Comments:        Anesthesia Quick Evaluation

## 2022-04-16 NOTE — Anesthesia Postprocedure Evaluation (Signed)
Anesthesia Post Note  Patient: Jody Nelson  Procedure(s) Performed: INSERTION OF ARTERIOVENOUS (AV) GORE-TEX GRAFT ARM (BRACHIAL AXILLARY) (Left)  Patient location during evaluation: PACU Anesthesia Type: General Level of consciousness: awake and alert Pain management: pain level controlled Vital Signs Assessment: post-procedure vital signs reviewed and stable Respiratory status: spontaneous breathing, nonlabored ventilation and respiratory function stable Cardiovascular status: blood pressure returned to baseline and stable Postop Assessment: no apparent nausea or vomiting Anesthetic complications: no   No notable events documented.   Last Vitals:  Vitals:   04/16/22 1200 04/16/22 1208  BP: 124/60 (!) 134/58  Pulse: 71 73  Resp: 10 15  Temp: (!) 36 C (!) 36.1 C  SpO2: 99% 93%    Last Pain:  Vitals:   04/16/22 1208  TempSrc: Temporal  PainSc: 0-No pain                 [place] Ouch

## 2022-04-16 NOTE — Interval H&P Note (Signed)
History and Physical Interval Note:  04/16/2022 7:07 AM  [name]  has presented today for surgery, with the diagnosis of ESRD.  The various methods of treatment have been discussed with the patient and family. After consideration of risks, benefits and other options for treatment, the patient has consented to  Procedure(s): INSERTION OF ARTERIOVENOUS (AV) GORE-TEX GRAFT ARM (BRACHIAL AXILLARY) (Left) as a surgical intervention.  The patient's history has been reviewed, patient examined, no change in status, stable for surgery.  I have reviewed the patient's chart and labs.  Questions were answered to the patient's satisfaction.     Jody Nelson

## 2022-04-16 NOTE — Op Note (Signed)
OPERATIVE NOTE   PROCEDURE: left brachial axillary arteriovenous graft placement  PRE-OPERATIVE DIAGNOSIS: End Stage Renal Disease  POST-OPERATIVE DIAGNOSIS: End Stage Renal Disease  SURGEON: Belenda Cruise Lori Liew  ASSISTANT(S): Annalee Genta, NP  ANESTHESIA: general  ESTIMATED BLOOD LOSS: <50 cc  FINDING(S): 4 mm brachial artery with a 6 mm axillary vein  SPECIMEN(S):  none  INDICATIONS:   Jody Nelson is a 71 y.o. female who presents with end stage renal disease.  The patient is scheduled for left brachial axillary AV graft placement.  The patient is aware the risks include but are not limited to: bleeding, infection, steal syndrome, nerve damage, ischemic monomelic neuropathy, failure to mature, and need for additional procedures.  The patient is aware of the risks of the procedure and elects to proceed forward.  DESCRIPTION: After full informed written consent was obtained from the patient, the patient was brought back to the operating room and placed supine upon the operating table.  Prior to induction, the patient received IV antibiotics.   After obtaining adequate anesthesia, the patient was then prepped and draped in the standard fashion for a left arm access procedure.   A first assistant was required to provide a safe and appropriate environment for executing the surgery.  The assistant was integral in providing retraction, exposure, running suture providing suction and in the closing process.   A linear incision was then created along the medial border of the biceps muscle just proximal to the antecubital crease and the brachial artery which was exposed through. The brachial artery was then looped proximally and distally with Silastic Vesseloops. Side branches were controlled with 4-0 silk ties.  Attention was then turned to the exposure of the axillary vein. Linear incision was then created medial to the proximal portion of the biceps at the level of the anterior axillary  crease. The axillary vein was exposed and again looped proximally and distally with Silastic vessel loops. Associated tributaries were also controlled with Silastic Vesseloops.  The Gore tunneler was then delivered onto the field and a subcutaneous path was made from the arterial incision to the venous incision. A 4-7 tapered PTFE propatent graft by Simeon Craft was then pulled through the subcutaneous tunnel. The arterial 4 mm portion was then approximated to the brachial artery. Brachial artery was controlled proximally and distally with the Silastic Vesseloops. Arteriotomy was made with an 11 blade scalpel and extended with Potts scissors and a 6-0 Prolene stay suture was placed. End graft to side brachial artery anastomosis was then fashioned with running CV 6 suture. Flushing maneuvers were performed suture line was hemostatic and the graft was then assessed for proper position and ease of future cannulation. Heparinized saline was infused into the vein and the graft was clamped with a vascular clamp. With the graft pressurized it was approximated to the axillary vein in its native bed and then marked with a surgical marker. The vein was then delivered into the surgical field and controlled with the Silastic vessel loops. Venotomy was then made with an 11 blade scalpel and extended with Potts scissors and a 6-0 Prolene suture was used as stay suture. The the graft was then sewn to the vein in an end graft to side vein fashion using running CV 6 suture.  Flushing maneuvers were performed and the artery was allowed to forward and back bleed.  Flow was then established through the AV graft  There was good  thrill in the venous outflow, and there was 1+ palpable  radial pulse.  At this point, I irrigated out the surgical wounds.  There was no further active bleeding.  The subcutaneous tissue was reapproximated with a running stitch of 3-0 Vicryl.  The skin was then reapproximated with a running subcuticular stitch of  4-0 Vicryl.  The skin was then cleaned, dried, and reinforced with Dermabond.    The patient tolerated this procedure well.   COMPLICATIONS: None  CONDITION: Jody Nelson [facility]  Office: [phone]   04/16/2022, 11:05 AM

## 2022-04-16 NOTE — Discharge Instructions (Signed)

## 2022-04-16 NOTE — H&P (View-Only) (Signed)
                       MRN : [id number]  Jody Nelson is a 71 y.o. (06/22/1950) female who presents with chief complaint of check access.  History of Present Illness:   The patient presents to [facility] department for creation of AV access.  There has been the typical 2+ months and seen in the office and therefore the H&P is required to be updated.  The patient recently started on dialysis and had their initial tunneled catheter placed 10/14/2021.  There have not been any previous accesses.     The patient is right handed.    Current access is via a catheter which is functioning adequately.  The patient has been told  the flow rates are good.  There have not been any episodes of catheter infection.  The patient denies fever and chills while on dialysis.  No tenderness or drainage at the exit site.   The patient denies amaurosis fugax or recent TIA symptoms. There are no recent neurological changes noted. There is no history of DVT, PE or superficial thrombophlebitis. No recent episodes of angina or shortness of breath documented.     Vein mapping shows inadequate vein for fistula   Current Meds  Medication Sig   acetaminophen (TYLENOL) 325 MG tablet Take 2 tablets by mouth every 8 (eight) hours as needed.    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   At high risk for falls    Atypical chest pain    Bilateral lower extremity edema    Chronic right sided weakness    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   a.) MRI brain  02/27/2011: old lacunar infarct in RIGHT pons and medulla oblongata   Cryptogenic stroke ([facility]) 10/11/2012   a.) CT brain 10/11/2012: tiny old lacunar infarct in head of caudate nucleus (left)   Cryptogenic stroke (HCC) 10/12/2012   a.) MRI brain 10/12/2012: multiple acute LEFT hemispheric infarcts   Cryptogenic stroke (HCC) 06/26/2014   a.) MRI brain 06/26/2014: acute RIGHT posterior frontal lobe infarct involving both and grey matter   Cryptogenic stroke ([facility]CC) 07/29/2015   a.) MRI brain 07/29/2015: 2 foci of diffusion restriction in the LEFT parietal lobe consistent with acute infarction   Cryptogenic stroke (HCC) 07/24/2017   a.) MRI brain 07/24/2017: punctate focus of acute ischemia in the medial LEFT temporal lobe   DDD (degenerative disc disease), cervical    Depression    DOE (dyspnea on exertion)    Dysarthria as late effect of stroke    ESRD (end stage renal disease) on dialysis (HCC)    a.) M-W-F   GERD (gastroesophageal reflux disease)    Hallux valgus, bilateral    Heterozygous factor V Leiden mutation (HCC) 07/18/2018   History of cardiac catheterization 08/30/2008   a.) LHC 08/30/2008: EF >60%, LVEDP 18 mmHg, normal coronaries.   History of hypercoagulable state    a.) hypercoagulable workup 07/18/2018: anti-beta 2 glycoprotein (-), lupus anticoagulant (-), prothrombin gene   mutation (-), (+) factor V leiden (heterozygous), anticardiolipin Ab; IgM (+), elevated homocystine   HLD (hyperlipidemia)    Homocystinemia 07/18/2018   a.) 07/18/2018 --> 22 mol/L   Hypertension    Insomnia    a.) melatonin + trazodone PRN   Long term current use of anticoagulant    a.) apixaban   Lymphedema of both lower extremities    Meralgia paresthetica, left lower limb    OSA (obstructive sleep apnea)    a.) does not require nocturnal PAP therapy   Osteoarthritis    Pneumonia    Pseudophakia of both eyes    Sarcoidosis    Secondary hyperparathyroidism of renal origin (HCC)    Type 2  diabetes mellitus treated with insulin (HCC)    Vitamin D deficiency     Past Surgical History:  Procedure Laterality Date   CESAREAN SECTION     DIALYSIS/PERMA CATHETER INSERTION N/A 10/14/2021   Procedure: DIALYSIS/PERMA CATHETER INSERTION;  Surgeon: Dew, Jason S, MD;  Location: [facility];  Service: Cardiovascular;  Laterality: N/A;   FRACTURE SURGERY     leg right fracture several surgeries, rods    Social History Social History   Tobacco Use   Smoking status: Former    Types: Cigarettes   Smokeless tobacco: Never  Substance Use Topics   Alcohol use: Never   Drug use: Never    Family History Family History  Problem Relation Age of Onset   Diabetes Mother    Colon cancer Sister    Diabetes Sister    Cancer Maternal Uncle    Clotting disorder Paternal Grandmother    Colon cancer Paternal Grandfather     Allergies  Allergen Reactions   Sulfamethoxazole-Trimethoprim     Other reaction(s): Kidney Disorder Other reaction(s): Kidney Disorder Hyperkalemia, AKI Hyperkalemia, AKI      REVIEW OF SYSTEMS (Negative unless checked)  Constitutional: []Weight loss  []Fever  []Chills Cardiac: []Chest pain   []Chest pressure   []Palpitations   []Shortness of breath when laying flat   []Shortness of breath with exertion. Vascular:  []Pain in legs with walking   []Pain in legs at rest  []History of DVT   []Phlebitis   []Swelling in legs   []Varicose veins   []Non-healing ulcers Pulmonary:   []Uses home oxygen   []Productive cough   []Hemoptysis   []Wheeze  []COPD   []Asthma Neurologic:  []Dizziness   []Seizures   []History of stroke   []History of TIA  []Aphasia   []Vissual changes   []Weakness or numbness in arm   []Weakness or numbness in leg Musculoskeletal:   []Joint swelling   []Joint pain   []Low back pain Hematologic:  []Easy bruising  []Easy bleeding   []Hypercoagulable state   []Anemic Gastrointestinal:  []Diarrhea   []Vomiting  [x]Gastroesophageal  reflux/heartburn   []Difficulty swallowing. Genitourinary:  [x]Chronic kidney disease   []Difficult urination  []Frequent urination   []Blood in urine Skin:  []Rashes   []Ulcers  Psychological:  []History of anxiety   [] History of major depression.  Physical Examination  Vitals:   04/16/22 0652  BP: 116/74  Pulse: 71  Resp: 18  Temp: (!) 97.4 F (36.3 C)  TempSrc: Temporal  SpO2: 97%  Weight: (!) 137.4 kg  Height: 5' 4" (1.626 m)   Body mass index is 52.01 kg/m. Gen: WD/WN, NAD morbidly obese Head: [place]/AT, No temporalis wasting.  Ear/Nose/Throat: Hearing grossly intact, nares w/o erythema or drainage Eyes: PER, EOMI, sclera nonicteric.    Neck: Supple, no gross masses or lesions.  No JVD.  Pulmonary:  Good air movement, no audible wheezing, no use of accessory muscles.  Cardiac: RRR, precordium non-hyperdynamic. Vascular:   Tunnel catheter clean dry and intact Vessel Right Left  Radial Palpable Palpable  Brachial Palpable Palpable  Gastrointestinal: soft, non-distended. No guarding/no peritoneal signs.  Musculoskeletal: M/S 5/5 throughout.  No deformity.  Neurologic: CN 2-12 intact. Pain and light touch intact in extremities.  Symmetrical.  Speech is fluent. Motor exam as listed above. Psychiatric: Judgment intact, Mood & affect appropriate for pt's clinical situation. Dermatologic: No rashes or ulcers noted.  No changes consistent with cellulitis.   CBC Lab Results  Component Value Date   WBC 5.4 03/02/2022   HGB 9.9 (L) 03/02/2022   HCT 33.0 (L) 03/02/2022   MCV 97.3 03/02/2022   PLT 225 03/02/2022    BMET    Component Value Date/Time   NA 141 03/02/2022 1215   K 4.3 03/02/2022 1215   CL 104 03/02/2022 1215   CO2 25 03/02/2022 1215   GLUCOSE 193 (H) 03/02/2022 1215   BUN 20 03/02/2022 1215   CREATININE 4.06 (H) 03/02/2022 1215   CALCIUM 8.7 (L) 03/02/2022 1215   GFRNONAA 11 (L) 03/02/2022 1215   CrCl cannot be calculated (Patient's most recent lab result  is older than the maximum 21 days allowed.).  COAG Lab Results  Component Value Date   INR 1.8 (H) 03/02/2022    Radiology MR FOOT LEFT W WO CONTRAST  Result Date: 04/09/2022 CLINICAL DATA:  No known injury, wound of the left foot. EXAM: MRI OF THE LEFT FOREFOOT WITHOUT AND WITH CONTRAST TECHNIQUE: Multiplanar, multisequence MR imaging of the left foot was performed both before and after administration of intravenous contrast. CONTRAST:  10mL GADAVIST GADOBUTROL 1 MMOL/ML IV SOLN COMPARISON:  None Available. FINDINGS: Patient motion degrades image quality limiting evaluation. Bones/Joint/Cartilage No fracture or dislocation. Normal alignment. No joint effusion. No marrow signal abnormality. No periosteal reaction or bone destruction. Moderate osteoarthritis of the first MTP joint. Ligaments Collateral ligaments are intact.  Lisfranc ligament is intact. Muscles and Tendons Flexor, peroneal and extensor compartment tendons are intact. Generalized muscle atrophy. Soft tissue No fluid collection or hematoma. No soft tissue mass. Soft tissue edema along the dorsal aspect of the foot likely reactive. IMPRESSION: 1. No osteomyelitis of the left foot. 2. Moderate osteoarthritis of the first MTP joint. Electronically Signed   By: Hetal  Patel M.D.   On: 04/09/2022 16:04     Assessment/Plan 1. End stage renal disease (HCC) Recommend:   At this time the patient does not have appropriate extremity access for dialysis   Patient should have a left brachial axillary graft created.   The risks, benefits and alternative therapies were reviewed in detail with the patient.  All questions were answered.  The patient agrees to proceed with surgery.  Is an additional note the patient does have heterozygous for factor V Leiden.  She was informed that this can negatively impact graft durability and create a situation of more frequent thrombotic episodes.   The patient will follow up with me in the office after the  surgery.    2. Essential hypertension Continue antihypertensive medications as already ordered, these medications have been reviewed and there are no changes at this time.    3. Diabetes mellitus with insulin therapy (HCC) Continue hypoglycemic medications as already ordered, these medications have been reviewed and there are no changes at this time.     Hgb A1C to be monitored as already arranged by primary service    4. Hyperlipidemia with target LDL less than 70 Continue statin as ordered and reviewed, no changes at this time    Myrtle Barnhard, MD  04/16/2022 7:03 AM   

## 2022-04-16 NOTE — Transfer of Care (Signed)
Immediate Anesthesia Transfer of Care Note  Patient: Jody Nelson  Procedure(s) Performed: INSERTION OF ARTERIOVENOUS (AV) GORE-TEX GRAFT ARM (BRACHIAL AXILLARY) (Left)  Patient Location: PACU  Anesthesia Type:General  Level of Consciousness: awake  Airway & Oxygen Therapy: Patient Spontanous Breathing  Post-op Assessment: Report given to RN and Post -op Vital signs reviewed and stable  Post vital signs: Reviewed and stable  Last Vitals:  Vitals Value Taken Time  BP 152/56 04/16/22 1052  Temp    Pulse 76 04/16/22 1054  Resp 10 04/16/22 1054  SpO2 100 % 04/16/22 1054  Vitals shown include unvalidated device data.  Last Pain:  Vitals:   04/16/22 0652  TempSrc: Temporal  PainSc: 0-No pain         Complications: No notable events documented.

## 2022-04-16 NOTE — Progress Notes (Signed)
MRN : [id number]  Jody Nelson is a 71 y.o. (1951/04/23) female who presents with chief complaint of check access.  History of Present Illness:   The patient presents to [facility] regional surgical department for creation of AV access.  There has been the typical 2+ months and seen in the office and therefore the H&P is required to be updated.  The patient recently started on dialysis and had their initial tunneled catheter placed 10/14/2021.  There have not been any previous accesses.     The patient is right handed.    Current access is via a catheter which is functioning adequately.  The patient has been told  the flow rates are good.  There have not been any episodes of catheter infection.  The patient denies fever and chills while on dialysis.  No tenderness or drainage at the exit site.   The patient denies amaurosis fugax or recent TIA symptoms. There are no recent neurological changes noted. There is no history of DVT, PE or superficial thrombophlebitis. No recent episodes of angina or shortness of breath documented.     Vein mapping shows inadequate vein for fistula   Current Meds  Medication Sig   acetaminophen (TYLENOL) 325 MG tablet Take 2 tablets by mouth every 8 (eight) hours as needed.    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   At high risk for falls    Atypical chest pain    Bilateral lower extremity edema    Chronic right sided weakness    Cryptogenic stroke ([place]) 02/27/2011   a.) MRI brain  02/27/2011: old lacunar infarct in RIGHT pons and medulla oblongata   Cryptogenic stroke ([place]) 10/11/2012   a.) CT brain 10/11/2012: tiny old lacunar infarct in head of caudate nucleus (left)   Cryptogenic stroke ([place]) 10/12/2012   a.) MRI brain 10/12/2012: multiple acute LEFT hemispheric infarcts   Cryptogenic stroke ([place]) 06/26/2014   a.) MRI brain 06/26/2014: acute RIGHT posterior frontal lobe infarct involving both and grey matter   Cryptogenic stroke ([place]) 07/29/2015   a.) MRI brain 07/29/2015: 2 foci of diffusion restriction in the LEFT parietal lobe consistent with acute infarction   Cryptogenic stroke ([place]) 07/24/2017   a.) MRI brain 07/24/2017: punctate focus of acute ischemia in the medial LEFT temporal lobe   DDD (degenerative disc disease), cervical    Depression    DOE (dyspnea on exertion)    Dysarthria as late effect of stroke    ESRD (end stage renal disease) on dialysis ([place])    a.) M-W-F   GERD (gastroesophageal reflux disease)    Hallux valgus, bilateral    Heterozygous factor V Leiden mutation ([place]) 07/18/2018   History of cardiac catheterization 08/30/2008   a.) LHC 08/30/2008: EF >60%, LVEDP 18 mmHg, normal coronaries.   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   a.) 07/18/2018 --> 22 mol/L   Hypertension    Insomnia    a.) melatonin + trazodone PRN   Long term current use of anticoagulant    a.) apixaban   Lymphedema of both lower extremities    Meralgia paresthetica, left lower limb    OSA (obstructive sleep apnea)    a.) does not require nocturnal PAP therapy   Osteoarthritis    Pneumonia    Pseudophakia of both eyes    Sarcoidosis    Secondary hyperparathyroidism of renal origin ([place])    Type 2  diabetes mellitus treated with insulin ([place])    Vitamin D deficiency     Past Surgical History:  Procedure Laterality Date   CESAREAN SECTION     DIALYSIS/PERMA CATHETER INSERTION N/A 10/14/2021   Procedure: DIALYSIS/PERMA CATHETER INSERTION;  Surgeon: Algernon Huxley, MD;  Location: [facility];  Service: Cardiovascular;  Laterality: N/A;   FRACTURE SURGERY     leg right fracture several surgeries, rods    Social History Social History   Tobacco Use   Smoking status: Former    Types: Cigarettes   Smokeless tobacco: Never  Substance Use Topics   Alcohol use: Never   Drug use: Never    Family History Family History  Problem Relation Age of Onset   Diabetes Mother    Colon cancer Sister    Diabetes Sister    Cancer Maternal Uncle    Clotting disorder Paternal Grandmother    Colon cancer Paternal Grandfather     Allergies  Allergen Reactions   Sulfamethoxazole-Trimethoprim     Other reaction(s): Kidney Disorder Other reaction(s): Kidney Disorder Hyperkalemia, AKI Hyperkalemia, AKI      REVIEW OF SYSTEMS (Negative unless checked)  Constitutional: _0 Weight loss  _1 Fever  _2 Chills Cardiac: _3 Chest pain   _4 Chest pressure   _5 Palpitations   _6 Shortness of breath when laying flat   _7 Shortness of breath with exertion. Vascular:  _8 Pain in legs with walking   _9 Pain in legs at rest  _10 History of DVT   _11 Phlebitis   _12 Swelling in legs   _13 Varicose veins   _14 Non-healing ulcers Pulmonary:   _15 Uses home oxygen   _16 Productive cough   _17 Hemoptysis   _18 Wheeze  _19 COPD   _20 Asthma Neurologic:  _21 Dizziness   _22 Seizures   _23 History of stroke   _24 History of TIA  _25 Aphasia   _26 Vissual changes   _27 Weakness or numbness in arm   _28 Weakness or numbness in leg Musculoskeletal:   _29 Joint swelling   _30 Joint pain   _31 Low back pain Hematologic:  _32 Easy bruising  _33 Easy bleeding   _34 Hypercoagulable state   _35 Anemic Gastrointestinal:  _36 Diarrhea   _37 Vomiting  _38 Gastroesophageal  reflux/heartburn   _39 Difficulty swallowing. Genitourinary:  _40 Chronic kidney disease   _41 Difficult urination  _42 Frequent urination   _43 Blood in urine Skin:  _44 Rashes   _45 Ulcers  Psychological:  _46 History of anxiety   _47  History of major depression.  Physical Examination  Vitals:   04/16/22 0652  BP: 116/74  Pulse: 71  Resp: 18  Temp: (!) 97.4 F (36.3 C)  TempSrc: Temporal  SpO2: 97%  Weight: (!) 137.4 kg  Height: _48  (1.626 m)   Body mass index is 52.01 kg/m. Gen: WD/WN, NAD morbidly obese Head: [place]/AT, No temporalis wasting.  Ear/Nose/Throat: Hearing grossly intact, nares w/o erythema or drainage Eyes: PER, EOMI, sclera nonicteric.  Neck: Supple, no gross masses or lesions.  No JVD.  Pulmonary:  Good air movement, no audible wheezing, no use of accessory muscles.  Cardiac: RRR, precordium non-hyperdynamic. Vascular:   Tunnel catheter clean dry and intact Vessel Right Left  Radial Palpable Palpable  Brachial Palpable Palpable  Gastrointestinal: soft, non-distended. No guarding/no peritoneal signs.  Musculoskeletal: M/S 5/5 throughout.  No deformity.  Neurologic: CN 2-12 intact. Pain and light touch intact in extremities.  Symmetrical.  Speech is fluent. Motor exam as listed above. Psychiatric: Judgment intact, Mood & affect appropriate for pt's clinical situation. Dermatologic: No rashes or ulcers noted.  No changes consistent with cellulitis.   CBC Lab Results  Component Value Date   WBC 5.4 03/02/2022   HGB 9.9 (L) 03/02/2022   HCT 33.0 (L) 03/02/2022   MCV 97.3 03/02/2022   PLT 225 03/02/2022    BMET    Component Value Date/Time   NA 141 03/02/2022 1215   K 4.3 03/02/2022 1215   CL 104 03/02/2022 1215   CO2 25 03/02/2022 1215   GLUCOSE 193 (H) 03/02/2022 1215   BUN 20 03/02/2022 1215   CREATININE 4.06 (H) 03/02/2022 1215   CALCIUM 8.7 (L) 03/02/2022 1215   GFRNONAA 11 (L) 03/02/2022 1215   CrCl cannot be calculated (Patient's most recent lab result  is older than the maximum 21 days allowed.).  COAG Lab Results  Component Value Date   INR 1.8 (H) 03/02/2022    Radiology MR FOOT LEFT W WO CONTRAST  Result Date: 04/09/2022 CLINICAL DATA:  No known injury, wound of the left foot. EXAM: MRI OF THE LEFT FOREFOOT WITHOUT AND WITH CONTRAST TECHNIQUE: Multiplanar, multisequence MR imaging of the left foot was performed both before and after administration of intravenous contrast. CONTRAST:  35m GADAVIST GADOBUTROL 1 MMOL/ML IV SOLN COMPARISON:  None Available. FINDINGS: Patient motion degrades image quality limiting evaluation. Bones/Joint/Cartilage No fracture or dislocation. Normal alignment. No joint effusion. No marrow signal abnormality. No periosteal reaction or bone destruction. Moderate osteoarthritis of the first MTP joint. Ligaments Collateral ligaments are intact.  Lisfranc ligament is intact. Muscles and Tendons Flexor, peroneal and extensor compartment tendons are intact. Generalized muscle atrophy. Soft tissue No fluid collection or hematoma. No soft tissue mass. Soft tissue edema along the dorsal aspect of the foot likely reactive. IMPRESSION: 1. No osteomyelitis of the left foot. 2. Moderate osteoarthritis of the first MTP joint. Electronically Signed   By: HKathreen DevoidM.D.   On: 04/09/2022 16:04     Assessment/Plan 1. End stage renal disease ([place] Recommend:   At this time the patient does not have appropriate extremity access for dialysis   Patient should have a left brachial axillary graft created.   The risks, benefits and alternative therapies were reviewed in detail with the patient.  All questions were answered.  The patient agrees to proceed with surgery.  Is an additional note the patient does have heterozygous for factor V Leiden.  She was informed that this can negatively impact graft durability and create a situation of more frequent thrombotic episodes.   The patient will follow up with me in the office after the  surgery.    2. Essential hypertension Continue antihypertensive medications as already ordered, these medications have been reviewed and there are no changes at this time.    3. Diabetes mellitus with insulin therapy ([place] Continue hypoglycemic medications as already ordered, these medications have been reviewed and there are no changes at this time.  Hgb A1C to be monitored as already arranged by primary service    4. Hyperlipidemia with target LDL less than 70 Continue statin as ordered and reviewed, no changes at this time    Hortencia Pilar, MD  04/16/2022 7:03 AM

## 2022-04-16 NOTE — Anesthesia Procedure Notes (Signed)
Procedure Name: Intubation Date/Time: 04/16/2022 8:18 AM  Performed by: Biagio Borg, CRNAPre-anesthesia Checklist: Patient identified, Emergency Drugs available, Suction available and Patient being monitored Patient Re-evaluated:Patient Re-evaluated prior to induction Oxygen Delivery Method: Circle system utilized Preoxygenation: Pre-oxygenation with 100% oxygen Induction Type: IV induction Ventilation: Mask ventilation without difficulty Laryngoscope Size: McGraph and 3 Grade View: Grade I Tube type: Oral Tube size: 7.0 mm Number of attempts: 1 Airway Equipment and Method: Stylet and Oral airway Placement Confirmation: ETT inserted through vocal cords under direct vision, positive ETCO2 and breath sounds checked- equal and bilateral Secured at: 21 cm Tube secured with: Tape Dental Injury: Teeth and Oropharynx as per pre-operative assessment

## 2022-04-19 ENCOUNTER — Other Ambulatory Visit (INDEPENDENT_AMBULATORY_CARE_PROVIDER_SITE_OTHER): Payer: Self-pay | Admitting: Vascular Surgery

## 2022-04-19 ENCOUNTER — Ambulatory Visit (INDEPENDENT_AMBULATORY_CARE_PROVIDER_SITE_OTHER): Payer: 59 | Admitting: Vascular Surgery

## 2022-04-19 ENCOUNTER — Ambulatory Visit (INDEPENDENT_AMBULATORY_CARE_PROVIDER_SITE_OTHER): Payer: 59

## 2022-04-19 ENCOUNTER — Encounter (INDEPENDENT_AMBULATORY_CARE_PROVIDER_SITE_OTHER): Payer: Self-pay | Admitting: Vascular Surgery

## 2022-04-19 ENCOUNTER — Encounter (INDEPENDENT_AMBULATORY_CARE_PROVIDER_SITE_OTHER): Payer: Self-pay

## 2022-04-19 VITALS — BP 143/73 | HR 91 | Resp 18 | Ht 64.0 in | Wt 304.0 lb

## 2022-04-19 DIAGNOSIS — Z95828 Presence of other vascular implants and grafts: Secondary | ICD-10-CM

## 2022-04-19 DIAGNOSIS — N186 End stage renal disease: Secondary | ICD-10-CM

## 2022-04-19 NOTE — Progress Notes (Signed)
Patient ID: Jody Nelson, female   DOB: 1950-08-31, 71 y.o.   MRN: [id number]  Chief Complaint  Patient presents with   Follow-up    ultrasound    HPI Jody Nelson is a 71 y.o. female.    Patient is status post creation of a left brachial axillary AV graft on April 16, 2022.  Patient complaining of pain in the arm.  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   At high risk for falls    Atypical chest pain    Bilateral lower extremity edema    Chronic right sided weakness    Cryptogenic stroke ([place]) 02/27/2011   a.) MRI brain 02/27/2011: old lacunar infarct in RIGHT pons and medulla oblongata   Cryptogenic stroke ([place]) 10/11/2012   a.) CT brain 10/11/2012: tiny old lacunar infarct in head of caudate nucleus (left)   Cryptogenic stroke ([place]) 10/12/2012   a.) MRI brain 10/12/2012: multiple acute LEFT hemispheric infarcts   Cryptogenic stroke ([place]) 06/26/2014   a.) MRI brain 06/26/2014: acute RIGHT posterior frontal lobe infarct involving both and grey matter   Cryptogenic stroke ([place]) 07/29/2015   a.) MRI brain 07/29/2015: 2 foci of diffusion restriction in the LEFT parietal lobe consistent with acute infarction   Cryptogenic stroke ([place]) 07/24/2017   a.) MRI brain 07/24/2017: punctate focus of acute ischemia in the medial LEFT temporal lobe   DDD (degenerative disc disease), cervical    Depression    DOE (dyspnea on exertion)    Dysarthria as late effect of stroke    ESRD (end stage renal disease) on dialysis ([place])     a.) M-W-F   GERD (gastroesophageal reflux disease)    Hallux valgus, bilateral    Heterozygous factor V Leiden mutation ([place]) 07/18/2018   History of cardiac catheterization 08/30/2008   a.) LHC 08/30/2008: EF >60%, LVEDP 18 mmHg, normal coronaries.   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   a.) 07/18/2018 --> 22 mol/L   Hypertension    Insomnia    a.) melatonin + trazodone PRN   Long term current use of anticoagulant    a.) apixaban   Lymphedema of both lower extremities    Meralgia paresthetica, left lower limb    OSA (obstructive sleep apnea)    a.) does not require nocturnal PAP therapy   Osteoarthritis    Pneumonia    Pseudophakia of both eyes    Sarcoidosis    Secondary hyperparathyroidism of renal origin ([place])    Type 2 diabetes mellitus treated with insulin ([place])    Vitamin D deficiency     Past Surgical History:  Procedure Laterality Date   CESAREAN SECTION     DIALYSIS/PERMA CATHETER INSERTION N/A 10/14/2021   Procedure: DIALYSIS/PERMA CATHETER INSERTION;  Surgeon: Algernon Huxley, MD;  Location: [facility]  INVASIVE CV LAB;  Service: Cardiovascular;  Laterality: N/A;   FRACTURE SURGERY     leg right fracture several surgeries, rods      Allergies  Allergen Reactions   Sulfamethoxazole-Trimethoprim     Other reaction(s): Kidney Disorder Other reaction(s): Kidney Disorder Hyperkalemia, AKI Hyperkalemia, AKI     Current Outpatient Medications  Medication Sig Dispense Refill   acetaminophen (TYLENOL) 325 MG tablet Take 2 tablets by mouth every 8 (eight) hours as needed.     ACIDOPHILUS LACTOBACILLUS PO Take 1 capsule by mouth 3 (three) times daily.     apixaban (ELIQUIS) 5 MG TABS tablet Take 1 tablet by  mouth every 12 (twelve) hours.     atorvastatin (LIPITOR) 80 MG tablet Take 80 mg by mouth daily.     calcitRIOL (ROCALTROL) 0.25 MCG capsule Take 0.25 mcg by mouth daily.     Calcium Carb-Cholecalciferol (OYSTER SHELL CALCIUM W/D) 500-5 MG-MCG TABS Take 1 tablet by mouth daily as needed.     carvedilol (COREG) 25 MG tablet Take 1 tablet by mouth 2 (two) times daily.     diclofenac Sodium (VOLTAREN) 1 % GEL Apply topically.     docusate sodium (COLACE) 100 MG capsule Take 100 mg by mouth 2 (two) times daily.     ferrous sulfate 324 (65 Fe) MG TBEC Take 1 tablet by mouth daily.     fluconazole (DIFLUCAN) 150 MG tablet Take 150 mg by mouth once.     folic acid (FOLVITE) 1 MG tablet Take 1 mg by mouth daily.     furosemide (LASIX) 40 MG tablet Take 10 mg by mouth daily.     gabapentin (NEURONTIN) 100 MG capsule Take 2 capsules by mouth 2 (two) times daily.     HYDROcodone-acetaminophen (NORCO) 5-325 MG tablet Take 1-2 tablets by mouth every 6 (six) hours as needed for moderate pain or severe pain. 40 tablet 0   insulin glargine (LANTUS) 100 UNIT/ML Solostar Pen Inject 10 Units into the skin at bedtime.     insulin lispro (HUMALOG) 100 UNIT/ML injection Inject 2-12 Units into the skin in the morning, at noon, and at bedtime. Per sliding scale     ipratropium-albuterol (DUONEB) 0.5-2.5 (3) MG/3ML SOLN Take by nebulization.     LAGEVRIO 200 MG CAPS capsule Take by mouth.     latanoprost (XALATAN) 0.005 % ophthalmic solution SMARTSIG:In Eye(s)     LIDOCAINE PAIN RELIEF 4 % 1 patch daily.     metroNIDAZOLE (METROGEL) 1 % gel Apply topically.     montelukast (SINGULAIR) 10 MG tablet Take 10 mg by mouth daily.     Multiple Vitamins-Minerals (PRESERVISION AREDS) TABS Take 250 mg by mouth 2 (two) times daily.     NYSTATIN powder Apply topically.     omeprazole (PRILOSEC) 10 MG capsule Take 10 mg by mouth daily.     polyethylene glycol (MIRALAX / GLYCOLAX) 17 g packet Take 17 g by mouth daily.      torsemide (DEMADEX) 10 MG tablet Take 10 mg by mouth daily.     traZODone (DESYREL) 50 MG tablet Take 1 tablet by mouth at bedtime as needed.     No current facility-administered medications for this visit.        Physical Exam BP (!) 143/73 (BP Location: Right Arm)   Pulse 91   Resp 18   Ht _0  (1.626 m)   Wt (!) 304 lb (137.9 kg)   BMI 52.18 kg/m  Gen:  WD/WN, NAD Skin:  incision C/D/I; moderate edema of the soft tissues good thrill good bruit     Assessment/Plan: 1. End stage renal disease [facility]) Patient has a successful AV graft.  It is less than a week old.  He does not yet available to review.  She will follow-up in 6 weeks duplex ultrasound will be obtained at that time and hopefully we can begin cannulation. - VAS US DUPLEX DIALYSIS ACCESS (AVF, AVG); Future      Hortencia Pilar 04/19/2022, 8:51 AM   This note was created with Dragon medical transcription system.  Any errors from dictation are unintentional.

## 2022-04-20 ENCOUNTER — Ambulatory Visit (INDEPENDENT_AMBULATORY_CARE_PROVIDER_SITE_OTHER): Payer: 59 | Admitting: Nurse Practitioner

## 2022-04-26 ENCOUNTER — Encounter: Payer: Self-pay | Admitting: Vascular Surgery

## 2022-04-26 MED ORDER — SODIUM CHLORIDE 0.9 % IR SOLN
Status: DC | PRN
  Administered 2022-04-16: 500 mL

## 2022-05-01 ENCOUNTER — Emergency Department
Admission: EM | Admit: 2022-05-01 | Discharge: 2022-05-01 | Disposition: A | Discharge status: Skilled Nursing Facility | Payer: 59 | Attending: Emergency Medicine | Admitting: Emergency Medicine

## 2022-05-01 ENCOUNTER — Emergency Department: Payer: 59

## 2022-05-01 ENCOUNTER — Encounter: Payer: Self-pay | Admitting: Emergency Medicine

## 2022-05-01 DIAGNOSIS — E1122 Type 2 diabetes mellitus with diabetic chronic kidney disease: Secondary | ICD-10-CM | POA: Diagnosis not present

## 2022-05-01 DIAGNOSIS — I130 Hypertensive heart and chronic kidney disease with heart failure and stage 1 through stage 4 chronic kidney disease, or unspecified chronic kidney disease: Secondary | ICD-10-CM | POA: Diagnosis not present

## 2022-05-01 DIAGNOSIS — N189 Chronic kidney disease, unspecified: Secondary | ICD-10-CM | POA: Diagnosis not present

## 2022-05-01 DIAGNOSIS — I6782 Cerebral ischemia: Secondary | ICD-10-CM | POA: Insufficient documentation

## 2022-05-01 DIAGNOSIS — I509 Heart failure, unspecified: Secondary | ICD-10-CM | POA: Diagnosis not present

## 2022-05-01 DIAGNOSIS — M25511 Pain in right shoulder: Secondary | ICD-10-CM | POA: Insufficient documentation

## 2022-05-01 DIAGNOSIS — Y92129 Unspecified place in nursing home as the place of occurrence of the external cause: Secondary | ICD-10-CM | POA: Diagnosis not present

## 2022-05-01 DIAGNOSIS — Z8673 Personal history of transient ischemic attack (TIA), and cerebral infarction without residual deficits: Secondary | ICD-10-CM | POA: Diagnosis not present

## 2022-05-01 DIAGNOSIS — W06XXXA Fall from bed, initial encounter: Secondary | ICD-10-CM | POA: Diagnosis not present

## 2022-05-01 DIAGNOSIS — W19XXXA Unspecified fall, initial encounter: Secondary | ICD-10-CM

## 2022-05-01 LAB — COMPREHENSIVE METABOLIC PANEL
ALT: 7 U/L (ref 0–44)
AST: 18 U/L (ref 15–41)
Albumin: 3.2 g/dL — ABNORMAL LOW (ref 3.5–5.0)
Alkaline Phosphatase: 79 U/L (ref 38–126)
Anion gap: 12 (ref 5–15)
BUN: 20 mg/dL (ref 8–23)
CO2: 23 mmol/L (ref 22–32)
Calcium: 8.9 mg/dL (ref 8.9–10.3)
Chloride: 102 mmol/L (ref 98–111)
Creatinine, Ser: 4.84 mg/dL — ABNORMAL HIGH (ref 0.44–1.00)
GFR, Estimated: 9 mL/min — ABNORMAL LOW (ref >60)
Glucose, Bld: 291 mg/dL — ABNORMAL HIGH (ref 70–99)
Potassium: 4.4 mmol/L (ref 3.5–5.1)
Sodium: 137 mmol/L (ref 135–145)
Total Bilirubin: 1.5 mg/dL — ABNORMAL HIGH (ref 0.3–1.2)
Total Protein: 7.8 g/dL (ref 6.5–8.1)

## 2022-05-01 LAB — CBC WITH DIFFERENTIAL/PLATELET
Abs Immature Granulocytes: 0.09 10*3/uL — ABNORMAL HIGH (ref 0.00–0.07)
Basophils Absolute: 0 10*3/uL (ref 0.0–0.1)
Basophils Relative: 0 %
Eosinophils Absolute: 0 10*3/uL (ref 0.0–0.5)
Eosinophils Relative: 0 %
HCT: 41.9 % (ref 36.0–46.0)
Hemoglobin: 12.5 g/dL (ref 12.0–15.0)
Immature Granulocytes: 1 %
Lymphocytes Relative: 5 %
Lymphs Abs: 0.8 10*3/uL (ref 0.7–4.0)
MCH: 29.8 pg (ref 26.0–34.0)
MCHC: 29.8 g/dL — ABNORMAL LOW (ref 30.0–36.0)
MCV: 99.8 fL (ref 80.0–100.0)
Monocytes Absolute: 2 10*3/uL — ABNORMAL HIGH (ref 0.1–1.0)
Monocytes Relative: 13 %
Neutro Abs: 12.8 10*3/uL — ABNORMAL HIGH (ref 1.7–7.7)
Neutrophils Relative %: 81 %
Platelets: 153 10*3/uL (ref 150–400)
RBC: 4.2 MIL/uL (ref 3.87–5.11)
RDW: 15.4 % (ref 11.5–15.5)
WBC: 15.7 10*3/uL — ABNORMAL HIGH (ref 4.0–10.5)
nRBC: 0 % (ref 0.0–0.2)

## 2022-05-01 LAB — TROPONIN I (HIGH SENSITIVITY): Troponin I (High Sensitivity): 11 ng/L (ref <18)

## 2022-05-01 NOTE — ED Notes (Signed)
Pt breathing e/u on room air, resting at this time. Report called to Amy at [facility].

## 2022-05-01 NOTE — ED Triage Notes (Signed)
Pt presents via EMS from [facility] following a fall. Pt was found by staff with her legs off the bed and her torso on the bed as if she was sliding out of bed. Pt was moved to the floor by staff and pt endorsed R shoulder pain. Of note, fistula placed last week - pt on Eliquis denies LOC or hitting her head.    VS with EMS 144/78 99% 103 HR

## 2022-05-01 NOTE — ED Provider Notes (Signed)
[facility] Provider Note   Event Date/Time   First MD Initiated Contact with Patient 05/01/22 [phone]     (approximate) History  Fall  HPI Jody Nelson is a 71 y.o. female with a past medical history of heart failure, CKD, hypertension, diabetes who presents from [facility] via EMS after being found "sliding onto the floor".  Patient has been complaining of right shoulder pain when she was found however has been using this right arm and shoulder since this morning.  EMS states that transport was for "being checked out" however patient does not have any other complaints other than this right shoulder pain.  Patient is only oriented to self at baseline and she is reportedly at baseline at this time however is unable to provide further review of systems or history.   Physical Exam  Triage Vital Signs: ED Triage Vitals [05/01/22 0427]  Enc Vitals Group     BP (!) 157/56     Pulse Rate (!) 106     Resp 18     Temp 99 F (37.2 C)     Temp Source Oral     SpO2 100 %     Weight (!) 304 lb 10.8 oz (138.2 kg)     Height 5\' 4"  (1.626 m)     Head Circumference      Peak Flow      Pain Score      Pain Loc      Pain Edu?      Excl. in [place]?    Most recent vital signs: Vitals:   05/01/22 0427  BP: (!) 157/56  Pulse: (!) 106  Resp: 18  Temp: 99 F (37.2 C)  SpO2: 100%   General: Asleep but easily awoken, oriented to self . CV:  Good peripheral perfusion.  Resp:  Normal effort.  Abd:  No distention.  Other:  Elderly the African-American female asleep in bed no acute distress ED Results / Procedures / Treatments  Labs (all labs ordered are listed, but only abnormal results are displayed) Labs Reviewed  CBC WITH DIFFERENTIAL/PLATELET - Abnormal; Notable for the following components:      Result Value   WBC 15.7 (*)    MCHC 29.8 (*)    Neutro Abs 12.8 (*)    Monocytes Absolute 2.0 (*)    Abs Immature Granulocytes 0.09 (*)    All other components within  normal limits  COMPREHENSIVE METABOLIC PANEL - Abnormal; Notable for the following components:   Glucose, Bld 291 (*)    Creatinine, Ser 4.84 (*)    Albumin 3.2 (*)    Total Bilirubin 1.5 (*)    GFR, Estimated 9 (*)    All other components within normal limits  TROPONIN I (HIGH SENSITIVITY)  TROPONIN I (HIGH SENSITIVITY)   EKG ED ECG REPORT I, Naaman Plummer, the attending physician, personally viewed and interpreted this ECG. Date: 05/01/2022 EKG Time: 0434 Rate: 105 Rhythm: Tachycardic sinus rhythm QRS Axis: normal Intervals: normal ST/T Wave abnormalities: normal Narrative Interpretation: Tachycardic sinus rhythm.  No evidence of acute ischemia RADIOLOGY ED MD interpretation: X-ray of the right shoulder interpreted by me independently does not show any evidence of acute abnormalities  CT of the head without contrast interpreted by me shows no evidence of acute abnormalities including no intracerebral hemorrhage, obvious masses, or significant edema  CT of the cervical spine interpreted by me does not show any evidence of acute abnormalities including no acute fracture, malalignment,  height loss, or dislocation -Agree with radiology assessment Official radiology report(s): DG Shoulder Right  Result Date: 05/01/2022 CLINICAL DATA:  Right shoulder pain EXAM: RIGHT SHOULDER - 2+ VIEW COMPARISON:  None Available. FINDINGS: No evidence of fracture or dislocation. Degenerative spurring at the [facility] joint. Generalized osteopenia. IMPRESSION: No acute finding. Electronically Signed   By: Jorje Guild M.D.   On: 05/01/2022 05:07   CT HEAD WO CONTRAST (5MM)  Result Date: 05/01/2022 CLINICAL DATA:  Fall. EXAM: CT HEAD WITHOUT CONTRAST CT CERVICAL SPINE WITHOUT CONTRAST TECHNIQUE: Multidetector CT imaging of the head and cervical spine was performed following the standard protocol without intravenous contrast. Multiplanar CT image reconstructions of the cervical spine were also generated.  RADIATION DOSE REDUCTION: This exam was performed according to the departmental dose-optimization program which includes automated exposure control, adjustment of the mA and/or kV according to patient size and/or use of iterative reconstruction technique. COMPARISON:  Brain MRI 03/02/2022 FINDINGS: CT HEAD FINDINGS Brain: No evidence of acute infarction, hemorrhage, hydrocephalus, extra-axial collection or mass lesion/mass effect. Chronic left frontal and occipitoparietal infarcts. Chronic small vessel ischemia in the deep cerebral white matter. Vascular: No hyperdense vessel or unexpected calcification. Skull: Normal. Negative for fracture or focal lesion. Sinuses/Orbits: No evidence of injury. CT CERVICAL SPINE FINDINGS Alignment: Straightening of cervical lordosis. Skull base and vertebrae: No acute fracture. No primary bone lesion or focal pathologic process. Soft tissues and spinal canal: No prevertebral fluid or swelling. No visible canal hematoma. Disc levels: Generalized disc space narrowing and ridging. Generalized facet spurring. Upper chest: Negative IMPRESSION: 1. No evidence of acute intracranial or cervical spine injury. 2. Remote left cerebral infarcts. Electronically Signed   By: Jorje Guild M.D.   On: 05/01/2022 05:06   CT Cervical Spine Wo Contrast  Result Date: 05/01/2022 CLINICAL DATA:  Fall. EXAM: CT HEAD WITHOUT CONTRAST CT CERVICAL SPINE WITHOUT CONTRAST TECHNIQUE: Multidetector CT imaging of the head and cervical spine was performed following the standard protocol without intravenous contrast. Multiplanar CT image reconstructions of the cervical spine were also generated. RADIATION DOSE REDUCTION: This exam was performed according to the departmental dose-optimization program which includes automated exposure control, adjustment of the mA and/or kV according to patient size and/or use of iterative reconstruction technique. COMPARISON:  Brain MRI 03/02/2022 FINDINGS: CT HEAD FINDINGS  Brain: No evidence of acute infarction, hemorrhage, hydrocephalus, extra-axial collection or mass lesion/mass effect. Chronic left frontal and occipitoparietal infarcts. Chronic small vessel ischemia in the deep cerebral white matter. Vascular: No hyperdense vessel or unexpected calcification. Skull: Normal. Negative for fracture or focal lesion. Sinuses/Orbits: No evidence of injury. CT CERVICAL SPINE FINDINGS Alignment: Straightening of cervical lordosis. Skull base and vertebrae: No acute fracture. No primary bone lesion or focal pathologic process. Soft tissues and spinal canal: No prevertebral fluid or swelling. No visible canal hematoma. Disc levels: Generalized disc space narrowing and ridging. Generalized facet spurring. Upper chest: Negative IMPRESSION: 1. No evidence of acute intracranial or cervical spine injury. 2. Remote left cerebral infarcts. Electronically Signed   By: Jorje Guild M.D.   On: 05/01/2022 05:06   PROCEDURES: Critical Care performed: No .1-3 Lead EKG Interpretation  Performed by: Naaman Plummer, MD Authorized by: Naaman Plummer, MD     Interpretation: normal     ECG rate:  94   ECG rate assessment: normal     Rhythm: sinus rhythm     Ectopy: none     Conduction: normal    MEDICATIONS ORDERED IN ED: Medications -  No data to display IMPRESSION / MDM / [place] / ED COURSE  I reviewed the triage vital signs and the nursing notes.                             The patient is on the cardiac monitor to evaluate for evidence of arrhythmia and/or significant heart rate changes. Patient's presentation is most consistent with acute presentation with potential threat to life or bodily function. Presenting after a fall that occurred just prior to arrival, resulting in injury to the right shoulder. The mechanism of injury was a mechanical ground level fall without syncope or near-syncope. The current level of pain is moderate. There was no loss of consciousness,  confusion, seizure, or memory impairment. There is not a laceration associated with the injury. Denies neck pain. The patient does take blood thinner medications. Denies vomiting, numbness/weakness, fever CT of the head and neck does not show any evidence of acute abnormalities, x-ray of the right shoulder does not show any evidence of acute bony abnormalities.  Patient encouraged to follow-up with orthopedics for continued pain if no improvement in the next week. Dispo: Discharge with PCP follow-up       FINAL CLINICAL IMPRESSION(S) / ED DIAGNOSES   Final diagnoses:  Fall, initial encounter  Acute pain of right shoulder   Rx / DC Orders   ED Discharge Orders     None      Note:  This document was prepared using Dragon voice recognition software and may include unintentional dictation errors.   Naaman Plummer, MD 05/01/22 [phone]

## 2022-05-01 NOTE — Discharge Instructions (Addendum)
Please use ibuprofen (Motrin) up to 800 mg every 8 hours, naproxen (Naprosyn) up to 500 mg every 12 hours, and/or acetaminophen (Tylenol) up to 4 g/day for any continued pain 

## 2022-05-01 NOTE — ED Notes (Signed)
Called [facility] for transport back to facility 

## 2022-05-11 DIAGNOSIS — D72829 Elevated white blood cell count, unspecified: Secondary | ICD-10-CM | POA: Diagnosis not present

## 2022-05-11 DIAGNOSIS — S81801A Unspecified open wound, right lower leg, initial encounter: Secondary | ICD-10-CM | POA: Diagnosis present

## 2022-05-11 DIAGNOSIS — X58XXXA Exposure to other specified factors, initial encounter: Secondary | ICD-10-CM | POA: Insufficient documentation

## 2022-05-11 LAB — CBC WITH DIFFERENTIAL/PLATELET
Abs Immature Granulocytes: 0.04 10*3/uL (ref 0.00–0.07)
Basophils Absolute: 0 10*3/uL (ref 0.0–0.1)
Basophils Relative: 0 %
Eosinophils Absolute: 0.1 10*3/uL (ref 0.0–0.5)
Eosinophils Relative: 1 %
HCT: 41.8 % (ref 36.0–46.0)
Hemoglobin: 12.3 g/dL (ref 12.0–15.0)
Immature Granulocytes: 0 %
Lymphocytes Relative: 12 %
Lymphs Abs: 1.2 10*3/uL (ref 0.7–4.0)
MCH: 29.3 pg (ref 26.0–34.0)
MCHC: 29.4 g/dL — ABNORMAL LOW (ref 30.0–36.0)
MCV: 99.5 fL (ref 80.0–100.0)
Monocytes Absolute: 1.2 10*3/uL — ABNORMAL HIGH (ref 0.1–1.0)
Monocytes Relative: 12 %
Neutro Abs: 7.6 10*3/uL (ref 1.7–7.7)
Neutrophils Relative %: 75 %
Platelets: 191 10*3/uL (ref 150–400)
RBC: 4.2 MIL/uL (ref 3.87–5.11)
RDW: 15.5 % (ref 11.5–15.5)
WBC: 10.2 10*3/uL (ref 4.0–10.5)
nRBC: 0 % (ref 0.0–0.2)

## 2022-05-11 LAB — COMPREHENSIVE METABOLIC PANEL
ALT: 8 U/L (ref 0–44)
AST: 14 U/L — ABNORMAL LOW (ref 15–41)
Albumin: 2.9 g/dL — ABNORMAL LOW (ref 3.5–5.0)
Alkaline Phosphatase: 76 U/L (ref 38–126)
Anion gap: 12 (ref 5–15)
BUN: 37 mg/dL — ABNORMAL HIGH (ref 8–23)
CO2: 21 mmol/L — ABNORMAL LOW (ref 22–32)
Calcium: 8.6 mg/dL — ABNORMAL LOW (ref 8.9–10.3)
Chloride: 98 mmol/L (ref 98–111)
Creatinine, Ser: 6.88 mg/dL — ABNORMAL HIGH (ref 0.44–1.00)
GFR, Estimated: 6 mL/min — ABNORMAL LOW (ref >60)
Glucose, Bld: 311 mg/dL — ABNORMAL HIGH (ref 70–99)
Potassium: 4.6 mmol/L (ref 3.5–5.1)
Sodium: 131 mmol/L — ABNORMAL LOW (ref 135–145)
Total Bilirubin: 0.7 mg/dL (ref 0.3–1.2)
Total Protein: 7.8 g/dL (ref 6.5–8.1)

## 2022-05-11 LAB — LACTIC ACID, PLASMA: Lactic Acid, Venous: 1.4 mmol/L (ref 0.5–1.9)

## 2022-05-11 NOTE — ED Triage Notes (Signed)
From [facility], facility reports abnormal labs Wound to right leg, being treated with antibiotics. Facility reports no improvement and WBCs increased. Able to stand and pivot with assist, normally uses walker.  Dialysis on Sunday. Fistula to left arm.  Pt denies any other c/o, denies pain, chest pain, or SOB.

## 2022-05-12 ENCOUNTER — Emergency Department: Payer: 59

## 2022-05-12 ENCOUNTER — Emergency Department
Admission: EM | Admit: 2022-05-12 | Discharge: 2022-05-12 | Disposition: A | Discharge status: Home / Self Care | Payer: 59 | Attending: Emergency Medicine | Admitting: Emergency Medicine

## 2022-05-12 DIAGNOSIS — S81801A Unspecified open wound, right lower leg, initial encounter: Secondary | ICD-10-CM | POA: Diagnosis not present

## 2022-05-12 DIAGNOSIS — T148XXA Other injury of unspecified body region, initial encounter: Secondary | ICD-10-CM

## 2022-05-12 MED ORDER — CEPHALEXIN 500 MG PO CAPS: 500.0000 mg | ORAL_CAPSULE | Freq: Four times a day (QID) | ORAL | 0 refills | Status: AC

## 2022-05-12 NOTE — ED Notes (Signed)
Called [facility] for transport back to facility 

## 2022-05-12 NOTE — Discharge Instructions (Signed)
Please seek medical attention for any high fevers, chest pain, shortness of breath, change in behavior, persistent vomiting, bloody stool or any other new or concerning symptoms.  

## 2022-05-12 NOTE — ED Provider Notes (Signed)
[facility] Provider Note    Event Date/Time   First MD Initiated Contact with Patient 05/12/22 [phone]     (approximate)   History   Abnormal Lab   HPI  Jody Nelson is a 72 y.o. female  who presents to the emergency department today from living facility because of concerns for right leg wound and elevated WBC count on blood work checked a few days ago. The patient states that she has had the wound for the past 2 months. Does have discomfort to that area. She denies any fevers.       Physical Exam   Triage Vital Signs: ED Triage Vitals [05/11/22 2227]  Enc Vitals Group     BP (!) 151/57     Pulse Rate 82     Resp 18     Temp 98 F (36.7 C)     Temp Source Oral     SpO2 99 %     Weight (!) 304 lb (137.9 kg)     Height 5\' 4"  (1.626 m)     Head Circumference      Peak Flow      Pain Score 0     Pain Loc      Pain Edu?      Excl. in [place]?     Most recent vital signs: Vitals:   05/11/22 2227 05/12/22 0419  BP: (!) 151/57 (!) 117/47  Pulse: 82 75  Resp: 18 18  Temp: 98 F (36.7 C) 98.4 F (36.9 C)  SpO2: 99% 97%   General: Awake, alert, oriented.  CV:  Good peripheral perfusion.  Resp:  Normal effort.  Abd:  No distention.  Other:  Wound to anterior right leg, no pus   ED Results / Procedures / Treatments   Labs (all labs ordered are listed, but only abnormal results are displayed) Labs Reviewed  CBC WITH DIFFERENTIAL/PLATELET - Abnormal; Notable for the following components:      Result Value   MCHC 29.4 (*)    Monocytes Absolute 1.2 (*)    All other components within normal limits  COMPREHENSIVE METABOLIC PANEL - Abnormal; Notable for the following components:   Sodium 131 (*)    CO2 21 (*)    Glucose, Bld 311 (*)    BUN 37 (*)    Creatinine, Ser 6.88 (*)    Calcium 8.6 (*)    Albumin 2.9 (*)    AST 14 (*)    GFR, Estimated 6 (*)    All other components within normal limits  LACTIC ACID, PLASMA      EKG  None   RADIOLOGY I independently interpreted and visualized the right tib/fib. My interpretation: No acute osseous abnormality Radiology interpretation:  IMPRESSION:  1. No radiographic evidence of osteomyelitis.  2. Marked soft tissue swelling about the leg with a small wound  about the anterior aspect of the tibia.      PROCEDURES:  Critical Care performed: No  Procedures   MEDICATIONS ORDERED IN ED: Medications - No data to display   IMPRESSION / MDM / [place] / ED COURSE  I reviewed the triage vital signs and the nursing notes.                              Differential diagnosis includes, but is not limited to, infection, skin wound, osteomyelitis.  Patient's presentation is most consistent with acute presentation with potential  threat to life or bodily function.  Patient presented to the emergency department today because of concern for elevated WBC count and wound to right lower leg. WBC is not elevated today. No purulent discharge from wound. Can give patient prescription for different antibiotic. Will give patient wound care center follow up information.  FINAL CLINICAL IMPRESSION(S) / ED DIAGNOSES   Final diagnoses:  Open wound of skin     Note:  This document was prepared using Dragon voice recognition software and may include unintentional dictation errors.    Nance Pear, MD 05/12/22 1012

## 2022-05-25 NOTE — Progress Notes (Deleted)
Patient ID: Jody Nelson, female   DOB: 07-04-1950, 72 y.o.   MRN: [id number]  No chief complaint on file.   HPI Jody Nelson is a 72 y.o. female.    Patient is status post creation of a left brachial axillary AV graft on April 16, 2022.   Patient complaining of pain in the arm.  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   At high risk for falls    Atypical chest pain    Bilateral lower extremity edema    Chronic right sided weakness    Cryptogenic stroke ([place]) 02/27/2011   a.) MRI brain 02/27/2011: old lacunar infarct in RIGHT pons and medulla oblongata   Cryptogenic stroke ([place]) 10/11/2012   a.) CT brain 10/11/2012: tiny old lacunar infarct in head of caudate nucleus (left)   Cryptogenic stroke ([place]) 10/12/2012   a.) MRI brain 10/12/2012: multiple acute LEFT hemispheric infarcts   Cryptogenic stroke ([place]) 06/26/2014   a.) MRI brain 06/26/2014: acute RIGHT posterior frontal lobe infarct involving both and grey matter   Cryptogenic stroke ([place]) 07/29/2015   a.) MRI brain 07/29/2015: 2 foci of diffusion restriction in the LEFT parietal lobe consistent with acute infarction   Cryptogenic stroke ([place]) 07/24/2017   a.) MRI brain 07/24/2017: punctate focus of acute ischemia in the medial LEFT temporal lobe   DDD (degenerative disc disease), cervical    Depression    DOE (dyspnea on exertion)    Dysarthria as late effect of stroke    ESRD (end stage renal disease) on dialysis ([place])    a.) M-W-F   GERD (gastroesophageal  reflux disease)    Hallux valgus, bilateral    Heterozygous factor V Leiden mutation ([place]) 07/18/2018   History of cardiac catheterization 08/30/2008   a.) LHC 08/30/2008: EF >60%, LVEDP 18 mmHg, normal coronaries.   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   a.) 07/18/2018 --> 22 mol/L   Hypertension    Insomnia    a.) melatonin + trazodone PRN   Long term current use of anticoagulant    a.) apixaban   Lymphedema of both lower extremities    Meralgia paresthetica, left lower limb    OSA (obstructive sleep apnea)    a.) does not require nocturnal PAP therapy   Osteoarthritis    Pneumonia    Pseudophakia of both eyes    Sarcoidosis    Secondary hyperparathyroidism of renal origin ([place])    Type 2 diabetes mellitus treated with insulin ([place])    Vitamin D deficiency     Past Surgical History:  Procedure Laterality Date   AV FISTULA PLACEMENT Left 04/16/2022   Procedure: INSERTION OF ARTERIOVENOUS (AV) GORE-TEX GRAFT ARM (BRACHIAL AXILLARY);  Surgeon: Katha Cabal, MD;  Location: [facility] ORS;  Service: Vascular;  Laterality: Left;  CESAREAN SECTION     DIALYSIS/PERMA CATHETER INSERTION N/A 10/14/2021   Procedure: DIALYSIS/PERMA CATHETER INSERTION;  Surgeon: Algernon Huxley, MD;  Location: [facility];  Service: Cardiovascular;  Laterality: N/A;   FRACTURE SURGERY     leg right fracture several surgeries, rods      Allergies  Allergen Reactions   Sulfamethoxazole-Trimethoprim     Other reaction(s): Kidney Disorder Other reaction(s): Kidney Disorder Hyperkalemia, AKI Hyperkalemia, AKI     Current Outpatient Medications  Medication Sig Dispense Refill   acetaminophen (TYLENOL) 325 MG tablet Take 2 tablets by  mouth every 8 (eight) hours as needed.     ACIDOPHILUS LACTOBACILLUS PO Take 1 capsule by mouth 3 (three) times daily.     apixaban (ELIQUIS) 5 MG TABS tablet Take 1 tablet by mouth every 12 (twelve) hours.     atorvastatin (LIPITOR) 80 MG tablet Take 80 mg by mouth daily.     calcitRIOL (ROCALTROL) 0.25 MCG capsule Take 0.25 mcg by mouth daily.     Calcium Carb-Cholecalciferol (OYSTER SHELL CALCIUM W/D) 500-5 MG-MCG TABS Take 1 tablet by mouth daily as needed.     carvedilol (COREG) 25 MG tablet Take 1 tablet by mouth 2 (two) times daily.     diclofenac Sodium (VOLTAREN) 1 % GEL Apply topically.     docusate sodium (COLACE) 100 MG capsule Take 100 mg by mouth 2 (two) times daily.     ferrous sulfate 324 (65 Fe) MG TBEC Take 1 tablet by mouth daily.     fluconazole (DIFLUCAN) 150 MG tablet Take 150 mg by mouth once.     folic acid (FOLVITE) 1 MG tablet Take 1 mg by mouth daily.     furosemide (LASIX) 40 MG tablet Take 10 mg by mouth daily.     gabapentin (NEURONTIN) 100 MG capsule Take 2 capsules by mouth 2 (two) times daily.     HYDROcodone-acetaminophen (NORCO) 5-325 MG tablet Take 1-2 tablets by mouth every 6 (six) hours as needed for moderate pain or severe pain. 40 tablet 0   insulin glargine (LANTUS) 100 UNIT/ML Solostar Pen Inject 10 Units into the skin at bedtime.     insulin lispro (HUMALOG) 100 UNIT/ML injection Inject 2-12 Units into the skin in the morning, at noon, and at bedtime. Per sliding scale     ipratropium-albuterol (DUONEB) 0.5-2.5 (3) MG/3ML SOLN Take by nebulization.     LAGEVRIO 200 MG CAPS capsule Take by mouth.     latanoprost (XALATAN) 0.005 % ophthalmic solution SMARTSIG:In Eye(s)     LIDOCAINE PAIN RELIEF 4 % 1 patch daily.     metroNIDAZOLE (METROGEL) 1 % gel Apply topically.     montelukast (SINGULAIR) 10 MG tablet Take 10 mg by mouth daily.     Multiple Vitamins-Minerals (PRESERVISION AREDS) TABS Take 250 mg by mouth 2 (two) times daily.     NYSTATIN powder  Apply topically.     omeprazole (PRILOSEC) 10 MG capsule Take 10 mg by mouth daily.     polyethylene glycol (MIRALAX / GLYCOLAX) 17 g packet Take 17 g by mouth daily.     torsemide (DEMADEX) 10 MG tablet Take 10 mg by mouth daily.     traZODone (DESYREL) 50 MG tablet Take 1 tablet by mouth at bedtime as needed.     No current facility-administered medications for this visit.        Physical Exam There were no vitals taken for this visit. Gen:  WD/WN, NAD Skin: incision C/D/I  Assessment/Plan:  No problem-specific Assessment & Plan notes found for this encounter.      Hortencia Pilar 05/25/2022, 2:53 PM   This note was created with Dragon medical transcription system.  Any errors from dictation are unintentional.

## 2022-05-27 ENCOUNTER — Ambulatory Visit (INDEPENDENT_AMBULATORY_CARE_PROVIDER_SITE_OTHER): Payer: 59 | Admitting: Vascular Surgery

## 2022-05-27 ENCOUNTER — Ambulatory Visit (INDEPENDENT_AMBULATORY_CARE_PROVIDER_SITE_OTHER): Payer: 59

## 2022-05-27 DIAGNOSIS — N186 End stage renal disease: Secondary | ICD-10-CM | POA: Diagnosis not present

## 2022-05-31 ENCOUNTER — Ambulatory Visit (INDEPENDENT_AMBULATORY_CARE_PROVIDER_SITE_OTHER): Payer: 59 | Admitting: Vascular Surgery

## 2022-05-31 ENCOUNTER — Encounter (INDEPENDENT_AMBULATORY_CARE_PROVIDER_SITE_OTHER): Payer: 59

## 2022-06-09 NOTE — Progress Notes (Signed)
Patient ID: Jody Nelson, female   DOB: 07-04-1950, 72 y.o.   MRN: [id number]  No chief complaint on file.   HPI Jody Nelson is a 72 y.o. female.    Patient is status post creation of a left brachial axillary AV graft on April 16, 2022.   Patient complaining of pain in the arm.  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   At high risk for falls    Atypical chest pain    Bilateral lower extremity edema    Chronic right sided weakness    Cryptogenic stroke ([place]) 02/27/2011   a.) MRI brain 02/27/2011: old lacunar infarct in RIGHT pons and medulla oblongata   Cryptogenic stroke ([place]) 10/11/2012   a.) CT brain 10/11/2012: tiny old lacunar infarct in head of caudate nucleus (left)   Cryptogenic stroke ([place]) 10/12/2012   a.) MRI brain 10/12/2012: multiple acute LEFT hemispheric infarcts   Cryptogenic stroke ([place]) 06/26/2014   a.) MRI brain 06/26/2014: acute RIGHT posterior frontal lobe infarct involving both and grey matter   Cryptogenic stroke ([place]) 07/29/2015   a.) MRI brain 07/29/2015: 2 foci of diffusion restriction in the LEFT parietal lobe consistent with acute infarction   Cryptogenic stroke ([place]) 07/24/2017   a.) MRI brain 07/24/2017: punctate focus of acute ischemia in the medial LEFT temporal lobe   DDD (degenerative disc disease), cervical    Depression    DOE (dyspnea on exertion)    Dysarthria as late effect of stroke    ESRD (end stage renal disease) on dialysis ([place])    a.) M-W-F   GERD (gastroesophageal  reflux disease)    Hallux valgus, bilateral    Heterozygous factor V Leiden mutation ([place]) 07/18/2018   History of cardiac catheterization 08/30/2008   a.) LHC 08/30/2008: EF >60%, LVEDP 18 mmHg, normal coronaries.   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   a.) 07/18/2018 --> 22 mol/L   Hypertension    Insomnia    a.) melatonin + trazodone PRN   Long term current use of anticoagulant    a.) apixaban   Lymphedema of both lower extremities    Meralgia paresthetica, left lower limb    OSA (obstructive sleep apnea)    a.) does not require nocturnal PAP therapy   Osteoarthritis    Pneumonia    Pseudophakia of both eyes    Sarcoidosis    Secondary hyperparathyroidism of renal origin ([place])    Type 2 diabetes mellitus treated with insulin ([place])    Vitamin D deficiency     Past Surgical History:  Procedure Laterality Date   AV FISTULA PLACEMENT Left 04/16/2022   Procedure: INSERTION OF ARTERIOVENOUS (AV) GORE-TEX GRAFT ARM (BRACHIAL AXILLARY);  Surgeon: Katha Cabal, MD;  Location: [facility] ORS;  Service: Vascular;  Laterality: Left;  CESAREAN SECTION     DIALYSIS/PERMA CATHETER INSERTION N/A 10/14/2021   Procedure: DIALYSIS/PERMA CATHETER INSERTION;  Surgeon: Algernon Huxley, MD;  Location: [facility];  Service: Cardiovascular;  Laterality: N/A;   FRACTURE SURGERY     leg right fracture several surgeries, rods      Allergies  Allergen Reactions   Sulfamethoxazole-Trimethoprim     Other reaction(s): Kidney Disorder Other reaction(s): Kidney Disorder Hyperkalemia, AKI Hyperkalemia, AKI     Current Outpatient Medications  Medication Sig Dispense Refill   acetaminophen (TYLENOL) 325 MG tablet Take 2 tablets by  mouth every 8 (eight) hours as needed.     ACIDOPHILUS LACTOBACILLUS PO Take 1 capsule by mouth 3 (three) times daily.     apixaban (ELIQUIS) 5 MG TABS tablet Take 1 tablet by mouth every 12 (twelve) hours.     atorvastatin (LIPITOR) 80 MG tablet Take 80 mg by mouth daily.     calcitRIOL (ROCALTROL) 0.25 MCG capsule Take 0.25 mcg by mouth daily.     Calcium Carb-Cholecalciferol (OYSTER SHELL CALCIUM W/D) 500-5 MG-MCG TABS Take 1 tablet by mouth daily as needed.     carvedilol (COREG) 25 MG tablet Take 1 tablet by mouth 2 (two) times daily.     diclofenac Sodium (VOLTAREN) 1 % GEL Apply topically.     docusate sodium (COLACE) 100 MG capsule Take 100 mg by mouth 2 (two) times daily.     ferrous sulfate 324 (65 Fe) MG TBEC Take 1 tablet by mouth daily.     fluconazole (DIFLUCAN) 150 MG tablet Take 150 mg by mouth once.     folic acid (FOLVITE) 1 MG tablet Take 1 mg by mouth daily.     furosemide (LASIX) 40 MG tablet Take 10 mg by mouth daily.     gabapentin (NEURONTIN) 100 MG capsule Take 2 capsules by mouth 2 (two) times daily.     HYDROcodone-acetaminophen (NORCO) 5-325 MG tablet Take 1-2 tablets by mouth every 6 (six) hours as needed for moderate pain or severe pain. 40 tablet 0   insulin glargine (LANTUS) 100 UNIT/ML Solostar Pen Inject 10 Units into the skin at bedtime.     insulin lispro (HUMALOG) 100 UNIT/ML injection Inject 2-12 Units into the skin in the morning, at noon, and at bedtime. Per sliding scale     ipratropium-albuterol (DUONEB) 0.5-2.5 (3) MG/3ML SOLN Take by nebulization.     LAGEVRIO 200 MG CAPS capsule Take by mouth.     latanoprost (XALATAN) 0.005 % ophthalmic solution SMARTSIG:In Eye(s)     LIDOCAINE PAIN RELIEF 4 % 1 patch daily.     metroNIDAZOLE (METROGEL) 1 % gel Apply topically.     montelukast (SINGULAIR) 10 MG tablet Take 10 mg by mouth daily.     Multiple Vitamins-Minerals (PRESERVISION AREDS) TABS Take 250 mg by mouth 2 (two) times daily.     NYSTATIN powder  Apply topically.     omeprazole (PRILOSEC) 10 MG capsule Take 10 mg by mouth daily.     polyethylene glycol (MIRALAX / GLYCOLAX) 17 g packet Take 17 g by mouth daily.     torsemide (DEMADEX) 10 MG tablet Take 10 mg by mouth daily.     traZODone (DESYREL) 50 MG tablet Take 1 tablet by mouth at bedtime as needed.     No current facility-administered medications for this visit.        Physical Exam There were no vitals taken for this visit. Gen:  WD/WN, NAD Skin: incision C/D/I;  AV graft good thrill good bruit     Assessment/Plan: 1. End stage renal disease (Jody Nelson) Recommend:  The patient is doing well and currently has adequate dialysis access. The patient's dialysis center is not reporting any access issues. Flow pattern is stable when compared to the prior ultrasound.  The patient should have a duplex ultrasound of the dialysis access in 6 months. The patient will follow-up with me in the office after each ultrasound   - VAS [place] (AVF, AVG); Future      Hortencia Pilar 06/09/2022, 1:55 PM   This note was created with Dragon medical transcription system.  Any errors from dictation are unintentional.

## 2022-06-10 ENCOUNTER — Encounter (INDEPENDENT_AMBULATORY_CARE_PROVIDER_SITE_OTHER): Payer: 59

## 2022-06-10 ENCOUNTER — Ambulatory Visit (INDEPENDENT_AMBULATORY_CARE_PROVIDER_SITE_OTHER): Payer: 59 | Admitting: Vascular Surgery

## 2022-06-10 ENCOUNTER — Encounter (INDEPENDENT_AMBULATORY_CARE_PROVIDER_SITE_OTHER): Payer: Self-pay | Admitting: Vascular Surgery

## 2022-06-10 ENCOUNTER — Ambulatory Visit (INDEPENDENT_AMBULATORY_CARE_PROVIDER_SITE_OTHER): Payer: Medicaid Other | Admitting: Vascular Surgery

## 2022-06-10 VITALS — Ht 64.0 in

## 2022-06-10 DIAGNOSIS — N186 End stage renal disease: Secondary | ICD-10-CM

## 2022-06-11 ENCOUNTER — Other Ambulatory Visit: Payer: Self-pay | Admitting: Family Medicine

## 2022-06-11 ENCOUNTER — Encounter (INDEPENDENT_AMBULATORY_CARE_PROVIDER_SITE_OTHER): Payer: Self-pay | Admitting: Vascular Surgery

## 2022-06-11 DIAGNOSIS — R2241 Localized swelling, mass and lump, right lower limb: Secondary | ICD-10-CM

## 2022-06-12 ENCOUNTER — Inpatient Hospital Stay: Payer: 59

## 2022-06-12 ENCOUNTER — Emergency Department: Payer: 59

## 2022-06-12 ENCOUNTER — Inpatient Hospital Stay
Admission: EM | Admit: 2022-06-12 | Discharge: 2022-06-21 | DRG: 579 | Disposition: A | Discharge status: Skilled Nursing Facility | Payer: 59 | Source: Skilled Nursing Facility | Attending: Internal Medicine | Admitting: Internal Medicine

## 2022-06-12 ENCOUNTER — Other Ambulatory Visit: Payer: Self-pay

## 2022-06-12 DIAGNOSIS — L89629 Pressure ulcer of left heel, unspecified stage: Secondary | ICD-10-CM | POA: Insufficient documentation

## 2022-06-12 DIAGNOSIS — E872 Acidosis, unspecified: Secondary | ICD-10-CM | POA: Diagnosis present

## 2022-06-12 DIAGNOSIS — E11622 Type 2 diabetes mellitus with other skin ulcer: Secondary | ICD-10-CM | POA: Diagnosis present

## 2022-06-12 DIAGNOSIS — Z794 Long term (current) use of insulin: Secondary | ICD-10-CM | POA: Diagnosis not present

## 2022-06-12 DIAGNOSIS — R54 Age-related physical debility: Secondary | ICD-10-CM | POA: Diagnosis present

## 2022-06-12 DIAGNOSIS — S81801A Unspecified open wound, right lower leg, initial encounter: Secondary | ICD-10-CM

## 2022-06-12 DIAGNOSIS — I132 Hypertensive heart and chronic kidney disease with heart failure and with stage 5 chronic kidney disease, or end stage renal disease: Secondary | ICD-10-CM | POA: Diagnosis present

## 2022-06-12 DIAGNOSIS — L97819 Non-pressure chronic ulcer of other part of right lower leg with unspecified severity: Secondary | ICD-10-CM | POA: Diagnosis present

## 2022-06-12 DIAGNOSIS — L89623 Pressure ulcer of left heel, stage 3: Secondary | ICD-10-CM | POA: Diagnosis present

## 2022-06-12 DIAGNOSIS — Z6841 Body Mass Index (BMI) 40.0 and over, adult: Secondary | ICD-10-CM | POA: Diagnosis not present

## 2022-06-12 DIAGNOSIS — Z992 Dependence on renal dialysis: Secondary | ICD-10-CM | POA: Diagnosis not present

## 2022-06-12 DIAGNOSIS — L089 Local infection of the skin and subcutaneous tissue, unspecified: Principal | ICD-10-CM

## 2022-06-12 DIAGNOSIS — K219 Gastro-esophageal reflux disease without esophagitis: Secondary | ICD-10-CM | POA: Diagnosis present

## 2022-06-12 DIAGNOSIS — L03115 Cellulitis of right lower limb: Secondary | ICD-10-CM | POA: Diagnosis not present

## 2022-06-12 DIAGNOSIS — M869 Osteomyelitis, unspecified: Secondary | ICD-10-CM | POA: Diagnosis not present

## 2022-06-12 DIAGNOSIS — E1122 Type 2 diabetes mellitus with diabetic chronic kidney disease: Secondary | ICD-10-CM | POA: Diagnosis not present

## 2022-06-12 DIAGNOSIS — N2581 Secondary hyperparathyroidism of renal origin: Secondary | ICD-10-CM | POA: Diagnosis present

## 2022-06-12 DIAGNOSIS — T148XXA Other injury of unspecified body region, initial encounter: Secondary | ICD-10-CM | POA: Diagnosis not present

## 2022-06-12 DIAGNOSIS — Z882 Allergy status to sulfonamides status: Secondary | ICD-10-CM

## 2022-06-12 DIAGNOSIS — D6859 Other primary thrombophilia: Secondary | ICD-10-CM

## 2022-06-12 DIAGNOSIS — S81809A Unspecified open wound, unspecified lower leg, initial encounter: Secondary | ICD-10-CM

## 2022-06-12 DIAGNOSIS — Z7901 Long term (current) use of anticoagulants: Secondary | ICD-10-CM

## 2022-06-12 DIAGNOSIS — I69351 Hemiplegia and hemiparesis following cerebral infarction affecting right dominant side: Secondary | ICD-10-CM

## 2022-06-12 DIAGNOSIS — L97919 Non-pressure chronic ulcer of unspecified part of right lower leg with unspecified severity: Secondary | ICD-10-CM | POA: Diagnosis present

## 2022-06-12 DIAGNOSIS — I5032 Chronic diastolic (congestive) heart failure: Secondary | ICD-10-CM | POA: Diagnosis present

## 2022-06-12 DIAGNOSIS — Z8673 Personal history of transient ischemic attack (TIA), and cerebral infarction without residual deficits: Secondary | ICD-10-CM | POA: Diagnosis not present

## 2022-06-12 DIAGNOSIS — D6851 Activated protein C resistance: Secondary | ICD-10-CM | POA: Diagnosis present

## 2022-06-12 DIAGNOSIS — Z961 Presence of intraocular lens: Secondary | ICD-10-CM | POA: Diagnosis present

## 2022-06-12 DIAGNOSIS — I890 Lymphedema, not elsewhere classified: Secondary | ICD-10-CM | POA: Diagnosis not present

## 2022-06-12 DIAGNOSIS — D6852 Prothrombin gene mutation: Secondary | ICD-10-CM | POA: Diagnosis present

## 2022-06-12 DIAGNOSIS — Z800 Family history of malignant neoplasm of digestive organs: Secondary | ICD-10-CM

## 2022-06-12 DIAGNOSIS — M7989 Other specified soft tissue disorders: Secondary | ICD-10-CM

## 2022-06-12 DIAGNOSIS — L02415 Cutaneous abscess of right lower limb: Secondary | ICD-10-CM | POA: Diagnosis present

## 2022-06-12 DIAGNOSIS — Z833 Family history of diabetes mellitus: Secondary | ICD-10-CM

## 2022-06-12 DIAGNOSIS — L89612 Pressure ulcer of right heel, stage 2: Secondary | ICD-10-CM | POA: Diagnosis present

## 2022-06-12 DIAGNOSIS — Z8669 Personal history of other diseases of the nervous system and sense organs: Secondary | ICD-10-CM

## 2022-06-12 DIAGNOSIS — D869 Sarcoidosis, unspecified: Secondary | ICD-10-CM | POA: Diagnosis present

## 2022-06-12 DIAGNOSIS — D631 Anemia in chronic kidney disease: Secondary | ICD-10-CM | POA: Diagnosis present

## 2022-06-12 DIAGNOSIS — I10 Essential (primary) hypertension: Secondary | ICD-10-CM | POA: Diagnosis present

## 2022-06-12 DIAGNOSIS — E785 Hyperlipidemia, unspecified: Secondary | ICD-10-CM | POA: Diagnosis present

## 2022-06-12 DIAGNOSIS — G4733 Obstructive sleep apnea (adult) (pediatric): Secondary | ICD-10-CM | POA: Diagnosis present

## 2022-06-12 DIAGNOSIS — E1165 Type 2 diabetes mellitus with hyperglycemia: Secondary | ICD-10-CM | POA: Diagnosis not present

## 2022-06-12 DIAGNOSIS — G4700 Insomnia, unspecified: Secondary | ICD-10-CM | POA: Diagnosis present

## 2022-06-12 DIAGNOSIS — R2241 Localized swelling, mass and lump, right lower limb: Secondary | ICD-10-CM

## 2022-06-12 DIAGNOSIS — I5030 Unspecified diastolic (congestive) heart failure: Secondary | ICD-10-CM | POA: Diagnosis present

## 2022-06-12 DIAGNOSIS — N186 End stage renal disease: Secondary | ICD-10-CM

## 2022-06-12 DIAGNOSIS — Z792 Long term (current) use of antibiotics: Secondary | ICD-10-CM

## 2022-06-12 DIAGNOSIS — Z832 Family history of diseases of the blood and blood-forming organs and certain disorders involving the immune mechanism: Secondary | ICD-10-CM

## 2022-06-12 DIAGNOSIS — Z87891 Personal history of nicotine dependence: Secondary | ICD-10-CM

## 2022-06-12 DIAGNOSIS — Z9181 History of falling: Secondary | ICD-10-CM

## 2022-06-12 DIAGNOSIS — L89619 Pressure ulcer of right heel, unspecified stage: Secondary | ICD-10-CM | POA: Insufficient documentation

## 2022-06-12 DIAGNOSIS — N189 Chronic kidney disease, unspecified: Secondary | ICD-10-CM | POA: Diagnosis not present

## 2022-06-12 DIAGNOSIS — Z87440 Personal history of urinary (tract) infections: Secondary | ICD-10-CM

## 2022-06-12 DIAGNOSIS — B9562 Methicillin resistant Staphylococcus aureus infection as the cause of diseases classified elsewhere: Secondary | ICD-10-CM | POA: Diagnosis present

## 2022-06-12 DIAGNOSIS — D509 Iron deficiency anemia, unspecified: Secondary | ICD-10-CM | POA: Diagnosis not present

## 2022-06-12 LAB — CBC WITH DIFFERENTIAL/PLATELET
Abs Immature Granulocytes: 0.08 10*3/uL — ABNORMAL HIGH (ref 0.00–0.07)
Basophils Absolute: 0 10*3/uL (ref 0.0–0.1)
Basophils Relative: 0 %
Eosinophils Absolute: 0.1 10*3/uL (ref 0.0–0.5)
Eosinophils Relative: 0 %
HCT: 32 % — ABNORMAL LOW (ref 36.0–46.0)
Hemoglobin: 9.4 g/dL — ABNORMAL LOW (ref 12.0–15.0)
Immature Granulocytes: 1 %
Lymphocytes Relative: 8 %
Lymphs Abs: 1.2 10*3/uL (ref 0.7–4.0)
MCH: 28.5 pg (ref 26.0–34.0)
MCHC: 29.4 g/dL — ABNORMAL LOW (ref 30.0–36.0)
MCV: 97 fL (ref 80.0–100.0)
Monocytes Absolute: 2 10*3/uL — ABNORMAL HIGH (ref 0.1–1.0)
Monocytes Relative: 12 %
Neutro Abs: 13 10*3/uL — ABNORMAL HIGH (ref 1.7–7.7)
Neutrophils Relative %: 79 %
Platelets: 164 10*3/uL (ref 150–400)
RBC: 3.3 MIL/uL — ABNORMAL LOW (ref 3.87–5.11)
RDW: 15.8 % — ABNORMAL HIGH (ref 11.5–15.5)
WBC: 16.4 10*3/uL — ABNORMAL HIGH (ref 4.0–10.5)
nRBC: 0 % (ref 0.0–0.2)

## 2022-06-12 LAB — BASIC METABOLIC PANEL
Anion gap: 13 (ref 5–15)
BUN: 33 mg/dL — ABNORMAL HIGH (ref 8–23)
CO2: 22 mmol/L (ref 22–32)
Calcium: 8.4 mg/dL — ABNORMAL LOW (ref 8.9–10.3)
Chloride: 99 mmol/L (ref 98–111)
Creatinine, Ser: 5.46 mg/dL — ABNORMAL HIGH (ref 0.44–1.00)
GFR, Estimated: 8 mL/min — ABNORMAL LOW (ref >60)
Glucose, Bld: 276 mg/dL — ABNORMAL HIGH (ref 70–99)
Potassium: 4.5 mmol/L (ref 3.5–5.1)
Sodium: 134 mmol/L — ABNORMAL LOW (ref 135–145)

## 2022-06-12 LAB — GLUCOSE, CAPILLARY: Glucose-Capillary: 232 mg/dL — ABNORMAL HIGH (ref 70–99)

## 2022-06-12 LAB — CBG MONITORING, ED: Glucose-Capillary: 326 mg/dL — ABNORMAL HIGH (ref 70–99)

## 2022-06-12 LAB — LACTIC ACID, PLASMA: Lactic Acid, Venous: 2.2 mmol/L (ref 0.5–1.9)

## 2022-06-12 MED ORDER — VANCOMYCIN HCL 2000 MG/400ML IV SOLN
2000.0000 mg | Freq: Once | INTRAVENOUS | Status: AC
  Administered 2022-06-12: 2000 mg via INTRAVENOUS
  Filled 2022-06-12: qty 400

## 2022-06-12 MED ORDER — INSULIN ASPART 100 UNIT/ML IJ SOLN
0.0000 [IU] | Freq: Three times a day (TID) | INTRAMUSCULAR | Status: DC
  Administered 2022-06-13: 3 [IU] via SUBCUTANEOUS
  Administered 2022-06-13: 11 [IU] via SUBCUTANEOUS
  Administered 2022-06-13: 7 [IU] via SUBCUTANEOUS
  Administered 2022-06-14 – 2022-06-15 (×3): 3 [IU] via SUBCUTANEOUS
  Administered 2022-06-16: 7 [IU] via SUBCUTANEOUS
  Administered 2022-06-16: 11 [IU] via SUBCUTANEOUS
  Administered 2022-06-16: 7 [IU] via SUBCUTANEOUS
  Administered 2022-06-17: 11 [IU] via SUBCUTANEOUS
  Filled 2022-06-12 (×10): qty 1

## 2022-06-12 MED ORDER — PIPERACILLIN-TAZOBACTAM 3.375 G IVPB 30 MIN
3.3750 g | Freq: Once | INTRAVENOUS | Status: AC
  Administered 2022-06-12: 3.375 g via INTRAVENOUS
  Filled 2022-06-12: qty 50

## 2022-06-12 MED ORDER — ALBUTEROL SULFATE (2.5 MG/3ML) 0.083% IN NEBU: 2.5000 mg | INHALATION_SOLUTION | RESPIRATORY_TRACT | Status: DC | PRN

## 2022-06-12 MED ORDER — VANCOMYCIN HCL IN DEXTROSE 1-5 GM/200ML-% IV SOLN: 1000.0000 mg | INTRAVENOUS | Status: DC | PRN

## 2022-06-12 MED ORDER — GADOBUTROL 1 MMOL/ML IV SOLN
10.0000 mL | Freq: Once | INTRAVENOUS | Status: AC | PRN
  Administered 2022-06-12: 10 mL via INTRAVENOUS

## 2022-06-12 MED ORDER — INSULIN GLARGINE-YFGN 100 UNIT/ML ~~LOC~~ SOLN
10.0000 [IU] | Freq: Every day | SUBCUTANEOUS | Status: DC
  Administered 2022-06-13: 10 [IU] via SUBCUTANEOUS
  Filled 2022-06-12 (×3): qty 0.1

## 2022-06-12 MED ORDER — CARVEDILOL 25 MG PO TABS
25.0000 mg | ORAL_TABLET | Freq: Two times a day (BID) | ORAL | Status: DC
  Administered 2022-06-12 – 2022-06-20 (×14): 25 mg via ORAL
  Filled 2022-06-12 (×16): qty 1

## 2022-06-12 MED ORDER — ONDANSETRON HCL 4 MG PO TABS: 4.0000 mg | ORAL_TABLET | Freq: Four times a day (QID) | ORAL | Status: DC | PRN

## 2022-06-12 MED ORDER — ACETAMINOPHEN 650 MG RE SUPP: 650.0000 mg | Freq: Four times a day (QID) | RECTAL | Status: DC | PRN

## 2022-06-12 MED ORDER — CALCITRIOL 0.25 MCG PO CAPS
0.2500 ug | ORAL_CAPSULE | Freq: Every day | ORAL | Status: DC
  Administered 2022-06-13 – 2022-06-20 (×5): 0.25 ug via ORAL
  Filled 2022-06-12 (×5): qty 1

## 2022-06-12 MED ORDER — ONDANSETRON HCL 4 MG/2ML IJ SOLN
4.0000 mg | Freq: Four times a day (QID) | INTRAMUSCULAR | Status: DC | PRN
  Administered 2022-06-20: 4 mg via INTRAVENOUS
  Filled 2022-06-12: qty 2

## 2022-06-12 MED ORDER — APIXABAN 5 MG PO TABS
5.0000 mg | ORAL_TABLET | Freq: Two times a day (BID) | ORAL | Status: DC
  Administered 2022-06-12 – 2022-06-13 (×3): 5 mg via ORAL
  Filled 2022-06-12 (×3): qty 1

## 2022-06-12 MED ORDER — FERROUS SULFATE 325 (65 FE) MG PO TABS
325.0000 mg | ORAL_TABLET | Freq: Every day | ORAL | Status: DC
  Administered 2022-06-13 – 2022-06-20 (×5): 325 mg via ORAL
  Filled 2022-06-12 (×5): qty 1

## 2022-06-12 MED ORDER — ATORVASTATIN CALCIUM 20 MG PO TABS
80.0000 mg | ORAL_TABLET | Freq: Every day | ORAL | Status: DC
  Administered 2022-06-12 – 2022-06-20 (×9): 80 mg via ORAL
  Filled 2022-06-12 (×9): qty 4

## 2022-06-12 MED ORDER — HYDROCODONE-ACETAMINOPHEN 5-325 MG PO TABS: 1.0000 | ORAL_TABLET | Freq: Four times a day (QID) | ORAL | Status: DC | PRN

## 2022-06-12 MED ORDER — PIPERACILLIN-TAZOBACTAM IN DEX 2-0.25 GM/50ML IV SOLN
2.2500 g | Freq: Three times a day (TID) | INTRAVENOUS | Status: DC
  Administered 2022-06-13 – 2022-06-14 (×4): 2.25 g via INTRAVENOUS
  Filled 2022-06-12 (×5): qty 50

## 2022-06-12 MED ORDER — INSULIN ASPART 100 UNIT/ML IJ SOLN
0.0000 [IU] | Freq: Every day | INTRAMUSCULAR | Status: DC
  Administered 2022-06-13: 2 [IU] via SUBCUTANEOUS
  Administered 2022-06-16: 4 [IU] via SUBCUTANEOUS
  Filled 2022-06-12 (×2): qty 1

## 2022-06-12 MED ORDER — HYDROCODONE-ACETAMINOPHEN 5-325 MG PO TABS
1.0000 | ORAL_TABLET | ORAL | Status: DC | PRN
  Administered 2022-06-13: 2 via ORAL
  Filled 2022-06-12: qty 2

## 2022-06-12 MED ORDER — FOLIC ACID 1 MG PO TABS
1.0000 mg | ORAL_TABLET | Freq: Every day | ORAL | Status: DC
  Administered 2022-06-13 – 2022-06-20 (×5): 1 mg via ORAL
  Filled 2022-06-12 (×5): qty 1

## 2022-06-12 MED ORDER — TORSEMIDE 20 MG PO TABS
10.0000 mg | ORAL_TABLET | Freq: Every day | ORAL | Status: DC
  Administered 2022-06-13 – 2022-06-20 (×5): 10 mg via ORAL
  Filled 2022-06-12 (×5): qty 1

## 2022-06-12 MED ORDER — HYDROMORPHONE HCL 1 MG/ML IJ SOLN
0.5000 mg | INTRAMUSCULAR | Status: DC | PRN
  Administered 2022-06-15 – 2022-06-16 (×3): 0.5 mg via INTRAVENOUS
  Filled 2022-06-12 (×3): qty 1

## 2022-06-12 MED ORDER — ACETAMINOPHEN 325 MG PO TABS: 650.0000 mg | ORAL_TABLET | Freq: Four times a day (QID) | ORAL | Status: DC | PRN

## 2022-06-12 NOTE — Assessment & Plan Note (Addendum)
Nonhealing wound right shin/possible osteomyelitis History of right tibial plateau fracture 2019 with hardware and chronic wound infection Chronic lymphedema Continue Zosyn and vancomycin Follow-up MRI Will get sed rate and CRP Consider orthopedic consult based on results of MRI Pain control Keep legs elevated to assist with swelling.  Continue Demadex Wound care consult

## 2022-06-12 NOTE — Consult Note (Signed)
Pharmacy Antibiotic Note  Jody Nelson is a 72 y.o. female admitted on 06/12/2022 with  osteomyelitis .  Patient with PMH of ESRD last dialysis session Saturday 2/3 PTA - did not complete. Pharmacy has been consulted for vancomycin and zosyn dosing.  Plan: Zosyn 2.25 g Q8H Vancomycin 2000 mg x 1 given in ED.  Initiate Vancomycin maintenance regimen of 1000 mg Q HD session. Goal Vancomycin level 15-25 mcg/mL Follow up with in patient HD schedule  Height: 5\' 4"  (162.6 cm) Weight: 135.6 kg (299 lb) IBW/kg (Calculated) : 54.7  Temp (24hrs), Avg:98.2 F (36.8 C), Min:98.2 F (36.8 C), Max:98.2 F (36.8 C)  Recent Labs  Lab 06/12/22 1936  WBC 16.4*  CREATININE 5.46*  LATICACIDVEN 2.2*    Estimated Creatinine Clearance: 13.4 mL/min (A) (by C-G formula based on SCr of 5.46 mg/dL (H)).    Allergies  Allergen Reactions   Sulfamethoxazole-Trimethoprim     Other reaction(s): Kidney Disorder Other reaction(s): Kidney Disorder Hyperkalemia, AKI Hyperkalemia, AKI     Antimicrobials this admission: 2/3 Zosyn >>  2/3 Vancomycin >>   Dose adjustments this admission:   Microbiology results:   Thank you for allowing pharmacy to be a part of this patient's care.  Dorothe Pea, PharmD, BCPS Clinical Pharmacist   06/12/2022 9:55 PM

## 2022-06-12 NOTE — Assessment & Plan Note (Signed)
Hemoglobin 9.4, down from 12.3 a month prior Continue to trend

## 2022-06-12 NOTE — Consult Note (Signed)
PHARMACY -  BRIEF ANTIBIOTIC NOTE   Pharmacy has received consult(s) for vancomycin from an ED provider.  The patient's profile has been reviewed for ht/wt/allergies/indication/available labs.    One time order(s) placed for vancomycin 2000 mg IV   Further antibiotics/pharmacy consults should be ordered by admitting physician if indicated.                       Thank you, Vick Frees, PharmD 06/12/2022  8:37 PM

## 2022-06-12 NOTE — ED Provider Notes (Addendum)
[facility] Provider Note    Event Date/Time   First MD Initiated Contact with Patient 06/12/22 1704     (approximate)   History   Wound Infection   HPI  Jody Nelson is a 72 y.o. female   Past medical history of end-stage renal disease on hemodialysis, stroke, factor V [place], on Eliquis, hypertension, hyperlipidemia, chronic bilateral lower extremity edema and ulceration who presents with worsening ulcerations on the right lower extremity.  Subacute worsening, no systemic symptoms like fevers or chills.  Pain is worsening.  No other acute medical complaints.  She had partial dialysis today that was stopped because she forgot to bring a pillow for her right leg and it was aching her during the session.   External Medical Documents Reviewed: Emergency department visit dated 05/12/2022 for right leg ulceration pain and elevated white blood cell count was discharged on antibiotics.      Physical Exam   Triage Vital Signs: ED Triage Vitals  Enc Vitals Group     BP 06/12/22 1706 129/80     Pulse Rate 06/12/22 1706 81     Resp 06/12/22 1706 18     Temp 06/12/22 1706 98.2 F (36.8 C)     Temp Source 06/12/22 1706 Oral     SpO2 06/12/22 1706 100 %     Weight 06/12/22 1710 299 lb (135.6 kg)     Height 06/12/22 1710 5\' 4"  (1.626 m)     Head Circumference --      Peak Flow --      Pain Score 06/12/22 1710 8     Pain Loc --      Pain Edu? --      Excl. in [place]? --     Most recent vital signs: Vitals:   06/12/22 1706  BP: 129/80  Pulse: 81  Resp: 18  Temp: 98.2 F (36.8 C)  SpO2: 100%    General: Awake, no distress.  CV:  Good peripheral perfusion.  Resp:  Normal effort.  Abd:  No distention.  Other:  Bilateral edematous lower extremities with 2 ulcerations with no obvious cellulitic changes surrounding on the right lower extremity anterior portion and no significant skin changes or infectious changes on the posterior area.  Vital signs within  normal limits no fever nontoxic comfortable appearance.   ED Results / Procedures / Treatments   Labs (all labs ordered are listed, but only abnormal results are displayed) Labs Reviewed  BASIC METABOLIC PANEL - Abnormal; Notable for the following components:      Result Value   Sodium 134 (*)    Glucose, Bld 276 (*)    BUN 33 (*)    Creatinine, Ser 5.46 (*)    Calcium 8.4 (*)    GFR, Estimated 8 (*)    All other components within normal limits  CBC WITH DIFFERENTIAL/PLATELET - Abnormal; Notable for the following components:   WBC 16.4 (*)    RBC 3.30 (*)    Hemoglobin 9.4 (*)    HCT 32.0 (*)    MCHC 29.4 (*)    RDW 15.8 (*)    Neutro Abs 13.0 (*)    Monocytes Absolute 2.0 (*)    Abs Immature Granulocytes 0.08 (*)    All other components within normal limits  LACTIC ACID, PLASMA - Abnormal; Notable for the following components:   Lactic Acid, Venous 2.2 (*)    All other components within normal limits  CBG MONITORING, ED - Abnormal; Notable  for the following components:   Glucose-Capillary 326 (*)    All other components within normal limits  LACTIC ACID, PLASMA     I ordered and reviewed the above labs they are notable for blood glucose is 326   RADIOLOGY I independently reviewed and interpreted xr R leg and see no obvious fracture or lytic changes   PROCEDURES:  Critical Care performed: No  Procedures   MEDICATIONS ORDERED IN ED: Medications  piperacillin-tazobactam (ZOSYN) IVPB 3.375 g (3.375 g Intravenous New Bag/Given 06/12/22 2039)  vancomycin (VANCOREADY) IVPB 2000 mg/400 mL (has no administration in time range)    External physician / consultants:  I spoke with hospitalist for admission & regarding care plan for this patient.   IMPRESSION / MDM / ASSESSMENT AND PLAN / ED COURSE  I reviewed the triage vital signs and the nursing notes.                                Patient's presentation is most consistent with acute presentation with  potential threat to life or bodily function.  Differential diagnosis includes, but is not limited to, chronic lymphedema, ulcer, cellulitis, osteomyelitis, sepsis   MDM: Patient with chronic ulcerations and chronic lymphedema with worsening ulcerations to right lower extremity and pain.  No systemic symptoms of infection.  X-ray shows questionable osteomyelitis but her hemodynamics appropriate reassuring she is nontoxic afebrile and labs are pending difficult access.  Will get an MRI to further assess for osteo    She has a lactic acidosis of 2.2 and a white blood cell count that is elevated.  I will give her IV antibiotics for concern for osteomyelitis, MRI is pending.  Admission.        FINAL CLINICAL IMPRESSION(S) / ED DIAGNOSES   Final diagnoses:  Wound infection     Rx / DC Orders   ED Discharge Orders     None        Note:  This document was prepared using Dragon voice recognition software and may include unintentional dictation errors.    Lucillie Garfinkel, MD 06/12/22 2047    Lucillie Garfinkel, MD 06/12/22 [phone]

## 2022-06-12 NOTE — Assessment & Plan Note (Signed)
Nephrology consult for continuation of dialysis 

## 2022-06-12 NOTE — ED Notes (Signed)
ED TO INPATIENT HANDOFF REPORT  ED Nurse Name and Phone #:   S Name/Age/Gender Jody Nelson 72 y.o. female Room/Bed: ED24A/ED24A  Code Status   Code Status: Full Code  Home/SNF/Other Skilled nursing facility Patient oriented to: self, place, and situation Is this baseline? Yes   Triage Complete: Triage complete  Chief Complaint Osteomyelitis of right leg [facility]) [M86.9]  Triage Note Pt presents to ED via [facility] from [facility] for wound infection. Pt has been seeing a wound dr for 2 months for boil on Right lower leg. About two days ago wound became red, swollen,warm to touch, draining and painful. EMS reports CBG of 428 all vitals WNL. Pt puts right leg pain at 8/10, denies pain anywhere else. No SOB   Allergies Allergies  Allergen Reactions   Sulfamethoxazole-Trimethoprim     Other reaction(s): Kidney Disorder Other reaction(s): Kidney Disorder Hyperkalemia, AKI Hyperkalemia, AKI     Level of Care/Admitting Diagnosis ED Disposition     ED Disposition  Admit   Condition  --   [place]: [place] [[id number]]  Level of Care: Med-Surg [16]  Covid Evaluation: Asymptomatic - no recent exposure (last 10 days) testing not required  Diagnosis: Osteomyelitis of right leg [facility]) [[id number]]  Admitting Physician: Athena Masse [[id number]]  Attending Physician: Athena Masse [[id number]]  Certification:: I certify this patient will need inpatient services for at least 2 midnights  Estimated Length of Stay: 3          B Medical/Surgery History Past Medical History:  Diagnosis Date   (HFpEF) heart failure with preserved ejection fraction ([place])    a.) TTE 10/12/2012: EF >55%, triv PR, G1DD; b.) TTE 10/18/2012: EF >55%, LVH, LAE, triv MT/TR/PR; c.) TTE 06/27/2014: EF >55%, mild MAC, G1DD; d.) TTE 03/18/2017: EF 60-65%, LAE, AoV sclerosis, G1DD; e.) TTE 07/13/2016: EF >55%, LVH, triv MR/TR; f.) TTE 01/17/2019: EF >55%, LVH, GLS  -18.1%, triv PR   Anemia of chronic renal failure    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   At high risk for falls    Atypical chest pain    Bilateral lower extremity edema    Chronic right sided weakness    Cryptogenic stroke ([place]) 02/27/2011   a.) MRI brain 02/27/2011: old lacunar infarct in RIGHT pons and medulla oblongata   Cryptogenic stroke ([place]) 10/11/2012   a.) CT brain 10/11/2012: tiny old lacunar infarct in head of caudate nucleus (left)   Cryptogenic stroke ([place]) 10/12/2012   a.) MRI brain 10/12/2012: multiple acute LEFT hemispheric infarcts   Cryptogenic stroke ([place]) 06/26/2014   a.) MRI brain 06/26/2014: acute RIGHT posterior frontal lobe infarct involving both and grey matter   Cryptogenic stroke ([place]) 07/29/2015   a.) MRI brain 07/29/2015: 2 foci of diffusion restriction in the LEFT parietal lobe consistent with acute infarction   Cryptogenic stroke ([place]) 07/24/2017   a.) MRI brain 07/24/2017: punctate focus of acute ischemia in the medial LEFT temporal lobe   DDD (degenerative disc disease), cervical    Depression    DOE (dyspnea on exertion)    Dysarthria as late effect of stroke    ESRD (end stage renal disease) on dialysis ([place])    a.) M-W-F   GERD (gastroesophageal reflux disease)    Hallux valgus, bilateral    Heterozygous factor V Leiden mutation ([place]) 07/18/2018   History of cardiac catheterization 08/30/2008   a.) LHC 08/30/2008: EF >60%, LVEDP 18 mmHg, normal coronaries.   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   a.) 07/18/2018 --> 22 mol/L   Hypertension    Insomnia    a.) melatonin + trazodone PRN   Long term current use of anticoagulant    a.) apixaban   Lymphedema of both lower  extremities    Meralgia paresthetica, left lower limb    OSA (obstructive sleep apnea)    a.) does not require nocturnal PAP therapy   Osteoarthritis    Pneumonia    Pseudophakia of both eyes    Sarcoidosis    Secondary hyperparathyroidism of renal origin ([place])    Type 2 diabetes mellitus treated with insulin ([place])    Vitamin D deficiency    Past Surgical History:  Procedure Laterality Date   AV FISTULA PLACEMENT Left 04/16/2022   Procedure: INSERTION OF ARTERIOVENOUS (AV) GORE-TEX GRAFT ARM (BRACHIAL AXILLARY);  Surgeon: Katha Cabal, MD;  Location: [facility] ORS;  Service: Vascular;  Laterality: Left;   CESAREAN SECTION     DIALYSIS/PERMA CATHETER INSERTION N/A 10/14/2021   Procedure: DIALYSIS/PERMA CATHETER INSERTION;  Surgeon: Algernon Huxley, MD;  Location: [facility];  Service: Cardiovascular;  Laterality: N/A;   FRACTURE SURGERY     leg right fracture several surgeries, rods     A IV Location/Drains/Wounds Patient Lines/Drains/Airways Status     Active Line/Drains/Airways     Name Placement date Placement time Site Days   Peripheral IV 06/12/22 20 G 1.88" Right Forearm 06/12/22  1927  Forearm  less than 1   Fistula / Graft Left Upper arm Arteriovenous vein graft 04/16/22  1044  Upper arm  57   Hemodialysis Catheter Right Internal jugular Double lumen Permanent (Tunneled) 10/14/21  0916  Internal jugular  241   Wound / Incision (Open or Dehisced) 04/16/22 Other (Comment) Heel Left Hydrocolloid dressindg applied 04/16/22  1115  Heel  57            Intake/Output Last 24 hours No intake or output data in the 24 hours ending 06/12/22 2150  Labs/Imaging Results for orders placed or performed during the hospital encounter of 06/12/22 (from the past 48 hour(s))  CBG monitoring, ED     Status: Abnormal   Collection Time: 06/12/22  5:06 PM  Result Value Ref Range   Glucose-Capillary 326 (H) 70 - 99 mg/dL    Comment: Glucose reference range applies only to samples  taken after fasting for at least 8 hours.  Basic metabolic panel     Status: Abnormal   Collection Time: 06/12/22  7:36 PM  Result Value Ref Range   Sodium 134 (L) 135 - 145 mmol/L   Potassium 4.5 3.5 - 5.1 mmol/L   Chloride 99 98 - 111 mmol/L   CO2 22 22 - 32 mmol/L   Glucose, Bld 276 (H) 70 - 99 mg/dL    Comment: Glucose reference range applies only to samples taken after fasting for at least 8 hours.   BUN 33 (H) 8 - 23 mg/dL   Creatinine, Ser 5.46 (H) 0.44 - 1.00 mg/dL   Calcium 8.4 (L) 8.9 - 10.3 mg/dL   GFR, Estimated 8 (L) >60 mL/min    Comment: (NOTE) Calculated using the CKD-EPI Creatinine Equation (2021)    Anion gap 13 5 - 15    Comment: Performed at  [facility], [address] [place], [place] [address]  CBC with Differential     Status: Abnormal   Collection Time: 06/12/22  7:36 PM  Result Value Ref Range   WBC 16.4 (H) 4.0 - 10.5 K/uL   RBC 3.30 (L) 3.87 - 5.11 MIL/uL   Hemoglobin 9.4 (L) 12.0 - 15.0 g/dL   HCT 32.0 (L) 36.0 - 46.0 %   MCV 97.0 80.0 - 100.0 fL   MCH 28.5 26.0 - 34.0 pg   MCHC 29.4 (L) 30.0 - 36.0 g/dL   RDW 15.8 (H) 11.5 - 15.5 %   Platelets 164 150 - 400 K/uL   nRBC 0.0 0.0 - 0.2 %   Neutrophils Relative % 79 %   Neutro Abs 13.0 (H) 1.7 - 7.7 K/uL   Lymphocytes Relative 8 %   Lymphs Abs 1.2 0.7 - 4.0 K/uL   Monocytes Relative 12 %   Monocytes Absolute 2.0 (H) 0.1 - 1.0 K/uL   Eosinophils Relative 0 %   Eosinophils Absolute 0.1 0.0 - 0.5 K/uL   Basophils Relative 0 %   Basophils Absolute 0.0 0.0 - 0.1 K/uL   Immature Granulocytes 1 %   Abs Immature Granulocytes 0.08 (H) 0.00 - 0.07 K/uL    Comment: Performed at [facility], [address] [place], [place] [address]  Lactic acid, plasma     Status: Abnormal   Collection Time: 06/12/22  7:36 PM  Result Value Ref Range   Lactic Acid, Venous 2.2 (HH) 0.5 - 1.9 mmol/L    Comment: CRITICAL RESULT CALLED TO, READ BACK BY AND VERIFIED WITH BETH Ilyssa Grennan AT 2005 06/12/2022  DLB Performed at [facility], [address], [place], [place] [address]    DG Tibia/Fibula Right  Result Date: 06/12/2022 CLINICAL DATA:  Wound infection. EXAM: RIGHT TIBIA AND FIBULA - 2 VIEW COMPARISON:  May 12, 2022 FINDINGS: No acute fracture seen. Stable sideplate and screw fixation of the proximal tibia. Mildly sclerotic appearance of the midportion of the fibula. No cortical erosions seen. Soft tissue edema about the leg. IMPRESSION: 1. No acute fracture or dislocation identified about the right tibia or fibula. 2. Mildly sclerotic appearance of the midportion of the fibula, nonspecific. No cortical erosions seen. Appearance is equivocal for osteomyelitis. 3. Soft tissue edema. Electronically Signed   By: Fidela Salisbury M.D.   On: 06/12/2022 17:59    Pending Labs Unresulted Labs (From admission, onward)     Start     Ordered   06/13/22 6160  Basic metabolic panel  Tomorrow morning,   R        06/12/22 2144   06/13/22 0500  CBC  Tomorrow morning,   R        06/12/22 2144   06/12/22 2137  Hemoglobin A1c  (Glycemic Control (SSI)  Q 4 Hours / Glycemic Control (SSI)  AC +/- HS)  Once,   R       Comments: To assess prior glycemic control    06/12/22 2144   06/12/22 2136  Lactic acid, plasma  Once,   STAT        06/12/22 2136   06/12/22 2136  HIV Antibody (routine testing w rflx)  (HIV Antibody (Routine testing w reflex) panel)  Once,   R        06/12/22 2144            Vitals/Pain Today's Vitals   06/12/22 1706 06/12/22 1710  BP: 129/80   Pulse:  81   Resp: 18   Temp: 98.2 F (36.8 C)   TempSrc: Oral   SpO2: 100%   Weight:  299 lb (135.6 kg)  Height:  5\' 4"  (1.626 m)  PainSc:  8     Isolation Precautions No active isolations  Medications Medications  vancomycin (VANCOREADY) IVPB 2000 mg/400 mL (has no administration in time range)  HYDROcodone-acetaminophen (NORCO/VICODIN) 5-325 MG per tablet 1-2 tablet (has no administration in time range)   atorvastatin (LIPITOR) tablet 80 mg (has no administration in time range)  carvedilol (COREG) tablet 25 mg (has no administration in time range)  torsemide (DEMADEX) tablet 10 mg (has no administration in time range)  calcitRIOL (ROCALTROL) capsule 0.25 mcg (has no administration in time range)  insulin glargine-yfgn (SEMGLEE) injection 10 Units (has no administration in time range)  apixaban (ELIQUIS) tablet 5 mg (has no administration in time range)  ferrous sulfate TBEC 325 mg (has no administration in time range)  folic acid (FOLVITE) tablet 1 mg (has no administration in time range)  acetaminophen (TYLENOL) tablet 650 mg (has no administration in time range)    Or  acetaminophen (TYLENOL) suppository 650 mg (has no administration in time range)  ondansetron (ZOFRAN) tablet 4 mg (has no administration in time range)    Or  ondansetron (ZOFRAN) injection 4 mg (has no administration in time range)  insulin aspart (novoLOG) injection 0-20 Units (has no administration in time range)  insulin aspart (novoLOG) injection 0-5 Units (has no administration in time range)  HYDROcodone-acetaminophen (NORCO/VICODIN) 5-325 MG per tablet 1-2 tablet (has no administration in time range)  HYDROmorphone (DILAUDID) injection 0.5 mg (has no administration in time range)  albuterol (PROVENTIL) (2.5 MG/3ML) 0.083% nebulizer solution 2.5 mg (has no administration in time range)  gadobutrol (GADAVIST) 1 MMOL/ML injection 10 mL (has no administration in time range)  piperacillin-tazobactam (ZOSYN) IVPB 3.375 g (0 g Intravenous Stopped 06/12/22 2112)    Mobility non-ambulatory     Focused Assessments    R Recommendations: See Admitting Provider Note  Report given to:   Additional Notes:

## 2022-06-12 NOTE — ED Triage Notes (Signed)
Pt presents to ED via [facility] from [facility] for wound infection. Pt has been seeing a wound dr for 2 months for boil on Right lower leg. About two days ago wound became red, swollen,warm to touch, draining and painful. EMS reports CBG of 428 all vitals WNL. Pt puts right leg pain at 8/10, denies pain anywhere else. No SOB

## 2022-06-12 NOTE — Assessment & Plan Note (Signed)
CPAP nightly

## 2022-06-12 NOTE — Assessment & Plan Note (Signed)
Blood pressure controlled.  Continue carvedilol 

## 2022-06-12 NOTE — H&P (Incomplete)
History and Physical    Patient: Jody Nelson NLZ:[id number] DOB: Jul 14, 1950 DOA: 06/12/2022 DOS: the patient was seen and examined on 06/12/2022 PCP: Patient, No Pcp Per  Patient coming from: Home  Chief Complaint:  Chief Complaint  Patient presents with   Wound Infection    HPI: [name] is a 72 y.o. 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  On the day of arrival she was having a catch-up dialysis session which had to be terminated early due to inability to prop up her leg because of the pain.  She denies fever or chills.  She denies chest pain or shortness of breath. ED course and data reviewe: Vitals within normal limits.  Labs significant for WBC of 16,000 with lactic acid 2.2.  Blood glucose 326-276.  X-ray tib-fib showed the following: IMPRESSION: 1. No acute fracture or dislocation identified about the right tibia or fibula. 2. Mildly sclerotic appearance of the midportion of the fibula, nonspecific. No cortical erosions seen. Appearance is equivocal for osteomyelitis. 3. Soft tissue edema  MRI ordered from the ER and currently pending Patient started on Zosyn and vancomycin for possible osteomyelitis.  Hospitalist consulted for admission.   Review of Systems: As mentioned in the history of present illness. All other systems reviewed and are  negative.  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   At high risk for falls    Atypical chest pain    Bilateral lower extremity edema    Chronic right sided weakness    Cryptogenic stroke ([place]) 02/27/2011   a.) MRI brain 02/27/2011: old lacunar infarct in RIGHT pons and medulla oblongata   Cryptogenic stroke ([place]) 10/11/2012   a.) CT brain 10/11/2012: tiny old lacunar infarct in head of caudate nucleus (left)   Cryptogenic stroke ([place]) 10/12/2012   a.) MRI brain 10/12/2012: multiple acute LEFT hemispheric infarcts   Cryptogenic stroke ([place]) 06/26/2014   a.) MRI brain 06/26/2014: acute RIGHT posterior frontal lobe infarct involving both and grey matter   Cryptogenic stroke ([place]) 07/29/2015   a.) MRI brain 07/29/2015: 2 foci of diffusion restriction in the LEFT parietal lobe consistent with acute infarction   Cryptogenic stroke ([place]) 07/24/2017   a.) MRI brain 07/24/2017: punctate focus of acute ischemia in the medial LEFT temporal lobe   DDD (degenerative disc disease), cervical    Depression    DOE (dyspnea on exertion)    Dysarthria as late effect of stroke  ESRD (end stage renal disease) on dialysis [facility])    a.) M-W-F   GERD (gastroesophageal reflux disease)    Hallux valgus, bilateral    Heterozygous factor V Leiden mutation ([place]) 07/18/2018   History of cardiac catheterization 08/30/2008   a.) LHC 08/30/2008: EF >60%, LVEDP 18 mmHg, normal coronaries.   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   a.) 07/18/2018 --> 22 mol/L   Hypertension    Insomnia    a.) melatonin + trazodone PRN   Long term current use of anticoagulant    a.) apixaban   Lymphedema of both lower extremities    Meralgia paresthetica, left lower limb    OSA (obstructive sleep apnea)    a.) does not require nocturnal PAP therapy   Osteoarthritis    Pneumonia    Pseudophakia of both eyes    Sarcoidosis    Secondary hyperparathyroidism of renal origin ([place])    Type 2 diabetes mellitus treated with insulin ([place])    Vitamin D deficiency    Past Surgical History:  Procedure Laterality Date   AV FISTULA PLACEMENT Left 04/16/2022   Procedure: INSERTION OF ARTERIOVENOUS (AV) GORE-TEX GRAFT ARM (BRACHIAL AXILLARY);  Surgeon: Katha Cabal, MD;  Location: [facility] ORS;  Service: Vascular;  Laterality: Left;   CESAREAN SECTION     DIALYSIS/PERMA CATHETER INSERTION N/A 10/14/2021   Procedure: DIALYSIS/PERMA CATHETER INSERTION;  Surgeon: Algernon Huxley, MD;  Location: [facility];  Service: Cardiovascular;  Laterality: N/A;   FRACTURE SURGERY     leg right fracture several surgeries, rods   Social History:  reports that she has quit smoking. Her smoking use included cigarettes. She has never used smokeless tobacco. She reports that she does not drink alcohol and does not use drugs.  Allergies  Allergen Reactions   Sulfamethoxazole-Trimethoprim     Other reaction(s): Kidney Disorder Other reaction(s): Kidney Disorder Hyperkalemia, AKI Hyperkalemia, AKI     Family History  Problem Relation Age of Onset   Diabetes Mother    Colon cancer Sister    Diabetes Sister    Cancer Maternal Uncle    Clotting disorder Paternal Grandmother    Colon cancer Paternal Grandfather     Prior to  Admission medications   Medication Sig Start Date End Date Taking? Authorizing Provider  acetaminophen (TYLENOL) 325 MG tablet Take 2 tablets by mouth every 8 (eight) hours as needed.    [provider]  ACIDOPHILUS LACTOBACILLUS PO Take 1 capsule by mouth 3 (three) times daily.    [provider]  apixaban (ELIQUIS) 5 MG TABS tablet Take 1 tablet by mouth every 12 (twelve) hours. 08/16/18   [provider]  atorvastatin (LIPITOR) 80 MG tablet Take 80 mg by mouth daily. 02/19/21   [provider]  calcitRIOL (ROCALTROL) 0.25 MCG capsule Take 0.25 mcg by mouth daily. 10/22/21   [provider]  Calcium Carb-Cholecalciferol (OYSTER SHELL CALCIUM W/D) 500-5 MG-MCG TABS Take 1 tablet by mouth daily as needed. 02/19/21   [provider]  carvedilol (COREG) 25 MG tablet Take 1 tablet by mouth 2 (two) times daily. 02/19/21   [provider]  diclofenac Sodium (VOLTAREN) 1 % GEL Apply topically. 10/14/21   [provider]  docusate sodium (COLACE) 100 MG capsule Take  100 mg by mouth 2 (two) times daily.    [provider]  ferrous sulfate 324 (65 Fe) MG TBEC Take 1 tablet by mouth daily. 02/19/21   [provider]  fluconazole (DIFLUCAN) 150 MG tablet Take 150 mg by mouth once. 01/14/22   [provider]  folic acid (FOLVITE) 1 MG tablet Take 1 mg by mouth daily. 02/19/21   [provider]  furosemide (LASIX) 40 MG tablet Take 10 mg by mouth daily. 02/19/21   [provider]  gabapentin (NEURONTIN) 100 MG capsule Take 2 capsules by mouth 2 (two) times daily. 02/19/21   [provider]  HYDROcodone-acetaminophen (NORCO) 5-325 MG tablet Take 1-2 tablets by mouth every 6 (six) hours as needed for moderate pain or severe pain. 04/16/22   Schnier, Dolores Lory, MD  insulin glargine (LANTUS) 100 UNIT/ML Solostar Pen Inject 10 Units into the skin at bedtime. 02/17/21   [provider]   insulin lispro (HUMALOG) 100 UNIT/ML injection Inject 2-12 Units into the skin in the morning, at noon, and at bedtime. Per sliding scale    [provider]  ipratropium-albuterol (DUONEB) 0.5-2.5 (3) MG/3ML SOLN Take by nebulization. 05/18/21   [provider]  LAGEVRIO 200 MG CAPS capsule Take by mouth. 01/04/22   [provider]  latanoprost (XALATAN) 0.005 % ophthalmic solution SMARTSIG:In Eye(s) 02/08/22   [provider]  LIDOCAINE PAIN RELIEF 4 % 1 patch daily. 11/25/21   [provider]  metroNIDAZOLE (METROGEL) 1 % gel Apply topically. 02/03/22   [provider]  montelukast (SINGULAIR) 10 MG tablet Take 10 mg by mouth daily. 02/19/21   [provider]  Multiple Vitamins-Minerals (PRESERVISION AREDS) TABS Take 250 mg by mouth 2 (two) times daily.    [provider]  NYSTATIN powder Apply topically. 01/27/22   [provider]  omeprazole (PRILOSEC) 10 MG capsule Take 10 mg by mouth daily. 02/25/22   [provider]  polyethylene glycol (MIRALAX / GLYCOLAX) 17 g packet Take 17 g by mouth daily.    [provider]  torsemide (DEMADEX) 10 MG tablet Take 10 mg by mouth daily. 10/22/21   [provider]  traZODone (DESYREL) 50 MG tablet Take 1 tablet by mouth at bedtime as needed. 08/22/20   [provider]    Physical Exam: Vitals:   06/12/22 1706 06/12/22 1710  BP: 129/80   Pulse: 81   Resp: 18   Temp: 98.2 F (36.8 C)   TempSrc: Oral   SpO2: 100%   Weight:  135.6 kg  Height:  5\' 4"  (1.626 m)   Physical Exam  Labs on Admission: I have personally reviewed following labs and imaging studies  CBC: Recent Labs  Lab 06/12/22 1936  WBC 16.4*  NEUTROABS 13.0*  HGB 9.4*  HCT 32.0*  MCV 97.0  PLT 321   Basic Metabolic Panel: Recent Labs  Lab 06/12/22 1936  NA 134*  K 4.5  CL 99  CO2 22  GLUCOSE 276*  BUN 33*  CREATININE 5.46*  CALCIUM 8.4*   GFR: Estimated  Creatinine Clearance: 13.4 mL/min (A) (by C-G formula based on SCr of 5.46 mg/dL (H)). Liver Function Tests: No results for input(s): "AST", "ALT", "ALKPHOS", "BILITOT", "PROT", "ALBUMIN" in the last 168 hours. No results for input(s): "LIPASE", "AMYLASE" in the last 168 hours. No results for input(s): "AMMONIA" in the last 168 hours. Coagulation Profile: No results for input(s): "INR", "PROTIME" in the last 168 hours. Cardiac Enzymes: No  results for input(s): "CKTOTAL", "CKMB", "CKMBINDEX", "TROPONINI" in the last 168 hours. BNP (last 3 results) No results for input(s): "PROBNP" in the last 8760 hours. HbA1C: No results for input(s): "HGBA1C" in the last 72 hours. CBG: Recent Labs  Lab 06/12/22 1706  GLUCAP 326*   Lipid Profile: No results for input(s): "CHOL", "HDL", "LDLCALC", "TRIG", "CHOLHDL", "LDLDIRECT" in the last 72 hours. Thyroid Function Tests: No results for input(s): "TSH", "T4TOTAL", "FREET4", "T3FREE", "THYROIDAB" in the last 72 hours. Anemia Panel: No results for input(s): "VITAMINB12", "FOLATE", "FERRITIN", "TIBC", "IRON", "RETICCTPCT" in the last 72 hours. Urine analysis: No results found for: "COLORURINE", "APPEARANCEUR", "LABSPEC", "PHURINE", "GLUCOSEU", "HGBUR", "BILIRUBINUR", "KETONESUR", "PROTEINUR", "UROBILINOGEN", "NITRITE", "LEUKOCYTESUR"  Radiological Exams on Admission: DG Tibia/Fibula Right  Result Date: 06/12/2022 CLINICAL DATA:  Wound infection. EXAM: RIGHT TIBIA AND FIBULA - 2 VIEW COMPARISON:  May 12, 2022 FINDINGS: No acute fracture seen. Stable sideplate and screw fixation of the proximal tibia. Mildly sclerotic appearance of the midportion of the fibula. No cortical erosions seen. Soft tissue edema about the leg. IMPRESSION: 1. No acute fracture or dislocation identified about the right tibia or fibula. 2. Mildly sclerotic appearance of the midportion of the fibula, nonspecific. No cortical erosions seen. Appearance is equivocal for osteomyelitis.  3. Soft tissue edema. Electronically Signed   By: Fidela Salisbury M.D.   On: 06/12/2022 17:59     Data Reviewed: Relevant notes from primary care and specialist visits, past discharge summaries as available in EHR, including Care Everywhere. Prior diagnostic testing as pertinent to current admission diagnoses Updated medications and problem lists for reconciliation ED course, including vitals, labs, imaging, treatment and response to treatment Triage notes, nursing and pharmacy notes and ED provider's notes Notable results as noted in HPI   Assessment and Plan: Cellulitis of right leg without foot Nonhealing wound right shin/possible osteomyelitis History of right tibial plateau fracture 2019 with hardware and chronic wound infection Chronic lymphedema Continue Zosyn and vancomycin Follow-up MRI Will get sed rate and CRP Consider orthopedic consult based on results of MRI Pain control Keep legs elevated to assist with swelling.  Continue Demadex Wound care consult  Uncontrolled type 2 diabetes mellitus with hyperglycemia, with long-term current use of insulin (HCC) Blood sugar on arrival was 326 Continue basal insulin Sliding scale coverage Get A1c  ESRD on hemodialysis [facility]) Nephrology consult for continuation of dialysis  Chronic anticoagulation Hypercoagulability 2020 (positive anticardiolipin, elevated homocysteine, factor V Leiden deficiency) h/o multiple cryptogenic CVAs -Patient underwent ability workup in March 2020 for cryptogenic strokes remarkable for low positive anticardiolipin IgM, elevated homocysteine, and Factor V Leiden Deficiency. No h/o venous blood clots.  -ASA was d/c'ed and she was started on Eliquis  -Continue Eliquis  Anemia of chronic renal failure Hemoglobin 9.4, down from 12.3 a month prior Continue to trend  Morbid obesity with BMI of 50.0-59.9, adult (HCC) Complicating factor to overall prognosis and care  Essential hypertension Blood  pressure controlled Continue carvedilol  History of obstructive sleep apnea CPAP nightly  H/O: CVA (cerebrovascular accident) History of multiple CVAs with residual right-sided weakness Continue atorvastatin and apixaban  (HFpEF) heart failure with preserved ejection fraction (HCC) Clinically euvolemic Continue Demadex, carvedilol   DVT prophylaxis: Eliquis  Consults: Nephrology, Dr. Candiss Norse  Advance Care Planning: full code  Family Communication: none  Disposition Plan: Back to previous home environment  Severity of Illness: The appropriate patient status for this patient is INPATIENT. Inpatient status is judged to be reasonable and necessary in order to provide  the required intensity of service to ensure the patient's safety. The patient's presenting symptoms, physical exam findings, and initial radiographic and laboratory data in the context of their chronic comorbidities is felt to place them at high risk for further clinical deterioration. Furthermore, it is not anticipated that the patient will be medically stable for discharge from the hospital within 2 midnights of admission.   * I certify that at the point of admission it is my clinical judgment that the patient will require inpatient hospital care spanning beyond 2 midnights from the point of admission due to high intensity of service, high risk for further deterioration and high frequency of surveillance required.*  Author: Athena Masse, MD 06/12/2022 9:18 PM  For on call review [email or web address].

## 2022-06-12 NOTE — ED Notes (Signed)
Lab called to obtain blood samples

## 2022-06-12 NOTE — ED Notes (Signed)
This RN attempted to start IV without success.

## 2022-06-12 NOTE — Assessment & Plan Note (Signed)
Complicating factor to overall prognosis and care

## 2022-06-12 NOTE — ED Notes (Signed)
RN Yong Channel attempted to use [place] to establish IV without success. IV team consult placed.

## 2022-06-12 NOTE — Assessment & Plan Note (Signed)
Blood sugar on arrival was 326 Continue basal insulin Sliding scale coverage Get A1c

## 2022-06-12 NOTE — Assessment & Plan Note (Signed)
History of multiple CVAs with residual right-sided weakness Continue atorvastatin and apixaban

## 2022-06-12 NOTE — Assessment & Plan Note (Addendum)
Hypercoagulability 2020 (positive anticardiolipin, elevated homocysteine, factor V Leiden deficiency) h/o multiple cryptogenic CVAs -Patient underwent ability workup in March 2020 for cryptogenic strokes remarkable for low positive anticardiolipin IgM, elevated homocysteine, and Factor V Leiden Deficiency. No h/o venous blood clots.  -ASA was d/c'ed and she was started on Eliquis  -Continue Eliquis

## 2022-06-12 NOTE — Assessment & Plan Note (Signed)
Clinically euvolemic Continue Demadex, carvedilol

## 2022-06-13 ENCOUNTER — Inpatient Hospital Stay: Payer: 59

## 2022-06-13 DIAGNOSIS — L89619 Pressure ulcer of right heel, unspecified stage: Secondary | ICD-10-CM | POA: Insufficient documentation

## 2022-06-13 DIAGNOSIS — D6859 Other primary thrombophilia: Secondary | ICD-10-CM

## 2022-06-13 DIAGNOSIS — M869 Osteomyelitis, unspecified: Secondary | ICD-10-CM | POA: Diagnosis not present

## 2022-06-13 LAB — CBC
HCT: 26.3 % — ABNORMAL LOW (ref 36.0–46.0)
Hemoglobin: 8 g/dL — ABNORMAL LOW (ref 12.0–15.0)
MCH: 29 pg (ref 26.0–34.0)
MCHC: 30.4 g/dL (ref 30.0–36.0)
MCV: 95.3 fL (ref 80.0–100.0)
Platelets: 146 10*3/uL — ABNORMAL LOW (ref 150–400)
RBC: 2.76 MIL/uL — ABNORMAL LOW (ref 3.87–5.11)
RDW: 15.9 % — ABNORMAL HIGH (ref 11.5–15.5)
WBC: 10.9 10*3/uL — ABNORMAL HIGH (ref 4.0–10.5)
nRBC: 0 % (ref 0.0–0.2)

## 2022-06-13 LAB — SEDIMENTATION RATE: Sed Rate: 128 mm/hr — ABNORMAL HIGH (ref 0–30)

## 2022-06-13 LAB — BASIC METABOLIC PANEL
Anion gap: 8 (ref 5–15)
BUN: 37 mg/dL — ABNORMAL HIGH (ref 8–23)
CO2: 23 mmol/L (ref 22–32)
Calcium: 7 mg/dL — ABNORMAL LOW (ref 8.9–10.3)
Chloride: 105 mmol/L (ref 98–111)
Creatinine, Ser: 5.17 mg/dL — ABNORMAL HIGH (ref 0.44–1.00)
GFR, Estimated: 8 mL/min — ABNORMAL LOW (ref >60)
Glucose, Bld: 281 mg/dL — ABNORMAL HIGH (ref 70–99)
Potassium: 4.4 mmol/L (ref 3.5–5.1)
Sodium: 136 mmol/L (ref 135–145)

## 2022-06-13 LAB — HEMOGLOBIN A1C
Hgb A1c MFr Bld: 8.7 % — ABNORMAL HIGH (ref 4.8–5.6)
Mean Plasma Glucose: 202.99 mg/dL

## 2022-06-13 LAB — GLUCOSE, CAPILLARY
Glucose-Capillary: 257 mg/dL — ABNORMAL HIGH (ref 70–99)
Glucose-Capillary: 206 mg/dL — ABNORMAL HIGH (ref 70–99)
Glucose-Capillary: 136 mg/dL — ABNORMAL HIGH (ref 70–99)
Glucose-Capillary: 89 mg/dL (ref 70–99)

## 2022-06-13 LAB — LACTIC ACID, PLASMA: Lactic Acid, Venous: 1.5 mmol/L (ref 0.5–1.9)

## 2022-06-13 LAB — C-REACTIVE PROTEIN: CRP: 22 mg/dL — ABNORMAL HIGH (ref <1.0)

## 2022-06-13 LAB — MRSA NEXT GEN BY PCR, NASAL: MRSA by PCR Next Gen: NOT DETECTED

## 2022-06-13 MED ORDER — TRAZODONE HCL 50 MG PO TABS
50.0000 mg | ORAL_TABLET | Freq: Every evening | ORAL | Status: DC | PRN
  Administered 2022-06-20: 50 mg via ORAL
  Filled 2022-06-13: qty 1

## 2022-06-13 MED ORDER — ANTICOAGULANT SODIUM CITRATE 4% (200MG/5ML) IV SOLN: 5.0000 mL | Status: DC | PRN

## 2022-06-13 MED ORDER — ALTEPLASE 2 MG IJ SOLR: 2.0000 mg | Freq: Once | INTRAMUSCULAR | Status: DC | PRN

## 2022-06-13 MED ORDER — LIDOCAINE-PRILOCAINE 2.5-2.5 % EX CREA: 1.0000 | TOPICAL_CREAM | CUTANEOUS | Status: DC | PRN

## 2022-06-13 MED ORDER — HEPARIN SODIUM (PORCINE) 1000 UNIT/ML DIALYSIS: 1000.0000 [IU] | INTRAMUSCULAR | Status: DC | PRN

## 2022-06-13 MED ORDER — LIDOCAINE HCL (PF) 1 % IJ SOLN: 5.0000 mL | INTRAMUSCULAR | Status: DC | PRN

## 2022-06-13 MED ORDER — CHLORHEXIDINE GLUCONATE CLOTH 2 % EX PADS
6.0000 | MEDICATED_PAD | Freq: Every day | CUTANEOUS | Status: DC
  Administered 2022-06-13 – 2022-06-20 (×8): 6 via TOPICAL

## 2022-06-13 MED ORDER — INSULIN GLARGINE-YFGN 100 UNIT/ML ~~LOC~~ SOLN
20.0000 [IU] | Freq: Every day | SUBCUTANEOUS | Status: DC
  Administered 2022-06-13: 20 [IU] via SUBCUTANEOUS
  Filled 2022-06-13 (×2): qty 0.2

## 2022-06-13 MED ORDER — HYDROCODONE-ACETAMINOPHEN 5-325 MG PO TABS
1.0000 | ORAL_TABLET | Freq: Three times a day (TID) | ORAL | Status: DC | PRN
  Administered 2022-06-13 – 2022-06-21 (×8): 1 via ORAL
  Filled 2022-06-13 (×9): qty 1

## 2022-06-13 MED ORDER — PENTAFLUOROPROP-TETRAFLUOROETH EX AERO: 1.0000 | INHALATION_SPRAY | CUTANEOUS | Status: DC | PRN

## 2022-06-13 MED ORDER — GABAPENTIN 100 MG PO CAPS
200.0000 mg | ORAL_CAPSULE | Freq: Two times a day (BID) | ORAL | Status: DC
  Administered 2022-06-13 – 2022-06-20 (×13): 200 mg via ORAL
  Filled 2022-06-13 (×13): qty 2

## 2022-06-13 NOTE — Progress Notes (Signed)
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  Patient presents to the emergency department with increased swelling and pain from right lower extremity.  She has been admitted for Wound infection [T14.8XXA, L08.9] Osteomyelitis of right leg [facility]) [M86.9]  Patient is known to our practice and receives outpatient dialysis treatments at [facility] on a Monday Wednesday Friday schedule, supervised by Dr. Candiss Norse.  Patient received a partial treatment on Saturday.  While reviewing the chart, it appears patient has had this wound for at least 2 months.  Patient is currently receiving wound care at her skilled nursing facility and has been prescribed a prolonged antibiotic regimen.  Patient states that during her make-up dialysis session on Saturday, she was unable to elevate her legs as usual due to pain and swelling.  Patient currently denies any known fever or chills.  Denies any loss or change in appetite, nausea, vomiting, or diarrhea.  Also denies shortness of breath.  Labs on ED arrival significant for sodium 134, glucose 276, BUN 33, creatinine 5.46 with GFR 8, lactic acid 2.2, and hemoglobin 9.4.  Right tib-fib MRI negative for osteomyelitis, diffuse subcutaneous edema present.  We have been consulted to manage dialysis needs during this admission.   Objective:  Vital signs in last 24 hours:  Temp:  [98.2 F (36.8 C)-98.9 F (37.2 C)] 98.6 F (37 C) (02/04 0852) Pulse Rate:  [80-81] 80 (02/04 0852) Resp:  [16-18] 16 (02/04 0852) BP: (129-133)/(46-80) 130/53 (02/04 0852) SpO2:  [98 %-100 %] 98 % (02/04 0852) Weight:  [135.6 kg] 135.6 kg (02/03 1710)  Weight change:  Filed Weights   06/12/22 1710  Weight: 135.6 kg     Intake/Output: No intake/output data recorded.   Intake/Output this shift:  Total I/O In: 50 [IV Piggyback:50] Out: -   Physical Exam: General: NAD  Head: Normocephalic, atraumatic. Moist oral mucosal membranes  Eyes: Anicteric  Lungs:  Clear to auscultation, normal effort, room air  Heart: Regular rate and rhythm  Abdomen:  Soft, nontender, obese  Extremities: Trace to 1+ peripheral edema.  Neurologic: Alert and oriented, moving all four extremities  Skin: No lesions  Access: Left AVG    Basic Metabolic Panel: Recent Labs  Lab 06/12/22 1936 06/13/22 0803  NA 134* 136  K 4.5 4.4  CL 99 105  CO2 22 23  GLUCOSE 276* 281*  BUN 33* 37*  CREATININE 5.46* 5.17*  CALCIUM 8.4* 7.0*    Liver Function Tests: No results for input(s): "AST", "ALT", "ALKPHOS", "BILITOT", "PROT", "ALBUMIN" in the last 168 hours. No results for input(s): "LIPASE", "AMYLASE" in the last 168 hours. No results for input(s): "AMMONIA" in the last 168 hours.  CBC: Recent Labs  Lab 06/12/22 1936 06/13/22 0803  WBC 16.4* 10.9*  NEUTROABS 13.0*  --   HGB 9.4* 8.0*  HCT 32.0* 26.3*  MCV 97.0 95.3  PLT 164 146*    Cardiac Enzymes: No results for input(s): "CKTOTAL", "CKMB", "CKMBINDEX", "TROPONINI" in the last 168 hours.  BNP: Invalid input(s): "POCBNP"  CBG: Recent Labs  Lab 06/12/22 1706 06/12/22 2342 06/13/22 0736 06/13/22 1159  GLUCAP 326* 232* 2* 206*    Microbiology: Results for orders placed or performed during the hospital encounter of 06/12/22  MRSA Next Gen by PCR, Nasal     Status:  None   Collection Time: 06/13/22 12:45 AM   Specimen: Nasal Mucosa; Nasal Swab  Result Value Ref Range Status   MRSA by PCR Next Gen NOT DETECTED NOT DETECTED Final    Comment: (NOTE) The GeneXpert MRSA Assay (FDA approved for NASAL specimens only), is one component of a comprehensive MRSA colonization surveillance program. It is not intended to diagnose MRSA infection nor to  guide or monitor treatment for MRSA infections. Test performance is not FDA approved in patients less than 27 years old. Performed at [facility], [address] [place], [place] [address]     Coagulation Studies: No results for input(s): "LABPROT", "INR" in the last 72 hours.  Urinalysis: No results for input(s): "COLORURINE", "LABSPEC", "PHURINE", "GLUCOSEU", "HGBUR", "BILIRUBINUR", "KETONESUR", "PROTEINUR", "UROBILINOGEN", "NITRITE", "LEUKOCYTESUR" in the last 72 hours.  Invalid input(s): "APPERANCEUR"    Imaging: MR TIBIA FIBULA RIGHT W WO CONTRAST  Result Date: 06/13/2022 CLINICAL DATA:  Lower leg soft tissue infection EXAM: MRI OF LOWER RIGHT EXTREMITY WITHOUT AND WITH CONTRAST TECHNIQUE: Multiplanar, multisequence MR imaging of the right tibia/fib was performed both before and after administration of intravenous contrast. CONTRAST:  17mL GADAVIST GADOBUTROL 1 MMOL/ML IV SOLN COMPARISON:  Radiographs 06/12/2022 FINDINGS: Bones/Joint/Cartilage No findings of osteomyelitis in the tibia or fibula. Proximal tibia obscured by artifact related to the plate and screw fixator. Specifically, the mid fibular region of concern on radiography appears normal. Ligaments N/A Muscles and Tendons Prominent regional muscular atrophy. No significant muscular enhancement indicate myositis. Soft tissues Substantial diffuse subcutaneous edema circumferentially in the calf. There is little in the way of associated enhancement and accordingly the appearance is not considered definitive or specific for cellulitis as opposed to other causes of subcutaneous edema. No drainable abscess. IMPRESSION: 1. Substantial diffuse subcutaneous edema in the calf, with little in the way of associated enhancement and accordingly the appearance is not considered definitive or specific for cellulitis as opposed to other causes of subcutaneous edema. No drainable abscess or osteomyelitis. 2. Prominent regional muscular  atrophy. Electronically Signed   By: Van Clines M.D.   On: 06/13/2022 12:36   DG Tibia/Fibula Right  Result Date: 06/12/2022 CLINICAL DATA:  Wound infection. EXAM: RIGHT TIBIA AND FIBULA - 2 VIEW COMPARISON:  May 12, 2022 FINDINGS: No acute fracture seen. Stable sideplate and screw fixation of the proximal tibia. Mildly sclerotic appearance of the midportion of the fibula. No cortical erosions seen. Soft tissue edema about the leg. IMPRESSION: 1. No acute fracture or dislocation identified about the right tibia or fibula. 2. Mildly sclerotic appearance of the midportion of the fibula, nonspecific. No cortical erosions seen. Appearance is equivocal for osteomyelitis. 3. Soft tissue edema. Electronically Signed   By: Fidela Salisbury M.D.   On: 06/12/2022 17:59     Medications:    piperacillin-tazobactam (ZOSYN)  IV Stopped (06/13/22 9485)   vancomycin      apixaban  5 mg Oral Q12H   atorvastatin  80 mg Oral QHS   calcitRIOL  0.25 mcg Oral Daily   carvedilol  25 mg Oral BID   Chlorhexidine Gluconate Cloth  6 each Topical Daily   ferrous sulfate  325 mg Oral Daily   folic acid  1 mg Oral Daily   gabapentin  200 mg Oral BID   insulin aspart  0-20 Units Subcutaneous TID WC   insulin aspart  0-5 Units Subcutaneous QHS   insulin glargine-yfgn  20 Units Subcutaneous Daily   torsemide  10 mg Oral Daily  acetaminophen **OR** acetaminophen, albuterol, HYDROcodone-acetaminophen, HYDROmorphone (DILAUDID) injection, ondansetron **OR** ondansetron (ZOFRAN) IV, traZODone, vancomycin  Assessment/ Plan:  Ms. Kimbely Whiteaker is a 72 y.o.  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  Patient presents to the emergency department with increased swelling and pain from right lower extremity.  She has been admitted for Wound infection [T14.8XXA,  L08.9] Osteomyelitis of right leg (HCC) [M86.9]   End-stage renal disease on hemodialysis.  Will maintain outpatient schedule if possible.  Next treatment scheduled for Monday.  Hepatitis B panel ordered per hospital protocol.  2. Anemia of chronic kidney disease Lab Results  Component Value Date   HGB 8.0 (L) 06/13/2022    Hemoglobin slightly decreased.  Will monitor for now.  Patient receives Mircera at outpatient clinic.  3. Secondary Hyperparathyroidism: with outpatient labs: PTH 463, phosphorus 6.8, calcium 8.4 on 05/17/22.    Lab Results  Component Value Date   CALCIUM 7.0 (L) 06/13/2022   CAION 1.13 (L) 04/16/2022    Currently prescribed calcitriol, cholecalciferol, and calcium acetate outpatient.  Calcitriol has been continued.  Will continue to monitor bone minerals during this admission.  4. Diabetes mellitus type II with chronic kidney disease/renal manifestations: insulin dependent. Home regimen includes Lantus.      LOS: [place] 2/4/202412:56 PM

## 2022-06-13 NOTE — Assessment & Plan Note (Signed)
Wound care consult Consider podiatry consult.  Patient states she wears a boot

## 2022-06-13 NOTE — Plan of Care (Signed)
  Problem: Skin Integrity: Goal: Skin integrity will improve Outcome: Progressing   Problem: Clinical Measurements: Goal: Ability to maintain clinical measurements within normal limits will improve Outcome: Progressing Goal: Will remain free from infection Outcome: Progressing   Problem: Activity: Goal: Risk for activity intolerance will decrease Outcome: Progressing   Problem: Pain Managment: Goal: General experience of comfort will improve Outcome: Progressing   Problem: Safety: Goal: Ability to remain free from injury will improve Outcome: Progressing   Problem: Skin Integrity: Goal: Risk for impaired skin integrity will decrease Outcome: Progressing

## 2022-06-13 NOTE — Progress Notes (Addendum)
PROGRESS NOTE    Jody Nelson  DUK:[id number] DOB: 04/06/1951 DOA: 06/12/2022 PCP: Patient, No Pcp Per     Brief Narrative:   From admission h and p Jody Nelson is a 72 y.o. 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  On the day of arrival she was having a catch-up dialysis session which had to be terminated early due to inability to prop up her leg because of the pain.  She denies fever or chills.  She denies chest pain or shortness of breath.    Assessment & Plan:   Principal Problem:   Osteomyelitis of right leg (HCC) Active Problems:   Non-healing wound of right lower extremity   Cellulitis of right leg without foot   Lymphedema of both lower extremities   Uncontrolled type 2 diabetes mellitus with hyperglycemia, with long-term current use of insulin (HCC)   ESRD on hemodialysis (HCC)   Chronic anticoagulation   (HFpEF) heart failure with preserved ejection fraction (HCC)   H/O: CVA (cerebrovascular accident)   History of obstructive sleep apnea   Essential hypertension   Morbid obesity with BMI of 50.0-59.9, adult (HCC)   Sarcoidosis   Anemia of chronic renal failure   Decubitus ulcer of heel, bilateral   Cellulitis/abscess of right leg without foot Nonhealing wound right shin/possible osteomyelitis History of right tibial plateau fracture 2019 with  hardware and chronic wound infection Chronic lymphedema Has had recurrent infection that leg since 2019 Purulence draining from sinus track in right leg, culture sent from probe that went in a good inch. Unfortunately abx started prior to blood cultures which I'm ordering today Continue Zosyn and vancomycin Follow-up MRI Will consult ID and likely also ortho pending results of MRI Pain control Keep legs elevated to assist with swelling.  Continue Demadex Wound care consult   Uncontrolled type 2 diabetes mellitus with hyperglycemia, with long-term current use of insulin (HCC) Blood sugar on arrival was 326 improved to 200s today Continue basal insulin but will increase from 10 to 20 Sliding scale coverage F/u A1c   ESRD on hemodialysis [facility]) Nephrology consult for continuation of dialysis, gets mwf at home   Chronic anticoagulation Hypercoagulability 2020 (positive anticardiolipin, elevated homocysteine, factor V Leiden deficiency) h/o multiple cryptogenic CVAs -Patient underwent ability workup in March 2020 for cryptogenic strokes remarkable for low positive anticardiolipin IgM, elevated homocysteine, and Factor V Leiden Deficiency. No h/o venous blood clots.  -ASA was d/c'ed and she was started on Eliquis  -Continue Eliquis   Decubitus ulcer of heel, bilateral Wound care consulted   Anemia of chronic renal failure Hemoglobin 9.4, down from 12.3 a month prior. This morning 8, no report of bleeding - Continue to trend   Morbid obesity with BMI of 50.0-59.9, adult ([place]) Complicating factor to overall prognosis and care   Essential hypertension Blood pressure controlled Continue carvedilol   History of obstructive sleep apnea CPAP nightly  H/O: CVA (cerebrovascular accident) History of multiple CVAs with residual right-sided weakness Continue atorvastatin and apixaban   (HFpEF) heart failure with preserved ejection fraction (HCC) Clinically euvolemic Continue Demadex,  carvedilol   DVT prophylaxis: apixaban Code Status: full Family Communication: daughter updated telephonically 2/4  Level of care: Med-Surg Status is: Inpatient Remains inpatient appropriate because: severity of illness Dispo: return to snf ([facility]) where she resides    Consultants:  pending  Procedures: none  Antimicrobials:  Vanc/zosyn    Subjective: Right leg pain  Objective: Vitals:   06/12/22 1706 06/12/22 1710 06/12/22 2242 06/13/22 0852  BP: 129/80  (!) 133/46 (!) 130/53  Pulse: 81  81 80  Resp: 18  18 16   Temp: 98.2 F (36.8 C)  98.9 F (37.2 C) 98.6 F (37 C)  TempSrc: Oral     SpO2: 100%  99% 98%  Weight:  135.6 kg    Height:  5\' 4"  (1.626 m)      Intake/Output Summary (Last 24 hours) at 06/13/2022 0935 Last data filed at 06/13/2022 2440 Gross per 24 hour  Intake 50 ml  Output --  Net 50 ml   Filed Weights   06/12/22 1710  Weight: 135.6 kg    Examination:  General exam: Appears calm and comfortable  Respiratory system: normal wob Cardiovascular system: S1 & S2 heard, soft systolic murmur Gastrointestinal system: Abdomen is obese, soft and nontender. No organomegaly or masses felt. Normal bowel sounds heard. Central nervous system: Alert and oriented. No focal neurological deficits. Extremities: Symmetric 5 x 5 power. Skin: ulcers right leg, purulent drainage right lateral leg Psychiatry: Judgement and insight appear normal. Mood & affect appropriate.     Data Reviewed: I have personally reviewed following labs and imaging studies  CBC: Recent Labs  Lab 06/12/22 1936 06/13/22 0803  WBC 16.4* 10.9*  NEUTROABS 13.0*  --   HGB 9.4* 8.0*  HCT 32.0* 26.3*  MCV 97.0 95.3  PLT 164 102*   Basic Metabolic Panel: Recent Labs  Lab 06/12/22 1936 06/13/22 0803  NA 134* 136  K 4.5 4.4  CL 99 105  CO2 22 23  GLUCOSE 276* 281*  BUN 33* 37*  CREATININE 5.46* 5.17*  CALCIUM 8.4* 7.0*   GFR: Estimated Creatinine Clearance: 14.1  mL/min (A) (by C-G formula based on SCr of 5.17 mg/dL (H)). Liver Function Tests: No results for input(s): "AST", "ALT", "ALKPHOS", "BILITOT", "PROT", "ALBUMIN" in the last 168 hours. No results for input(s): "LIPASE", "AMYLASE" in the last 168 hours. No results for input(s): "AMMONIA" in the last 168 hours. Coagulation Profile: No results for input(s): "INR", "PROTIME" in the last 168 hours. Cardiac Enzymes: No results for input(s): "CKTOTAL", "CKMB", "CKMBINDEX", "TROPONINI" in the last 168 hours. BNP (last 3 results) No results for input(s): "PROBNP" in the last 8760 hours. HbA1C: No results for input(s): "HGBA1C" in the last 72 hours. CBG: Recent Labs  Lab 06/12/22 1706 06/12/22 2342 06/13/22 0736  GLUCAP 326* 232* 257*   Lipid Profile: No results for input(s): "CHOL", "HDL", "LDLCALC", "TRIG", "CHOLHDL", "LDLDIRECT" in the last 72 hours. Thyroid Function Tests: No results for input(s): "TSH", "T4TOTAL", "FREET4", "T3FREE", "THYROIDAB" in the last 72 hours. Anemia Panel: No results for input(s): "VITAMINB12", "FOLATE", "FERRITIN", "TIBC", "IRON", "RETICCTPCT" in the last 72 hours. Urine analysis: No results found for: "COLORURINE", "APPEARANCEUR", "LABSPEC", "PHURINE", "GLUCOSEU", "HGBUR", "BILIRUBINUR", "KETONESUR", "PROTEINUR", "UROBILINOGEN", "NITRITE", "LEUKOCYTESUR" Sepsis Labs: @LABRCNTIP (procalcitonin:4,lacticidven:4)  ) Recent Results (from the past 240 hour(s))  MRSA Next Gen by PCR,  Nasal     Status: None   Collection Time: 06/13/22 12:45 AM   Specimen: Nasal Mucosa; Nasal Swab  Result Value Ref Range Status   MRSA by PCR Next Gen NOT DETECTED NOT DETECTED Final    Comment: (NOTE) The GeneXpert MRSA Assay (FDA approved for NASAL specimens only), is one component of a comprehensive MRSA colonization surveillance program. It is not intended to diagnose MRSA infection nor to guide or monitor treatment for MRSA infections. Test performance is not FDA approved in  patients less than 36 years old. Performed at [facility], [address] [place], [place] [address]          Radiology Studies: DG Tibia/Fibula Right  Result Date: 06/12/2022 CLINICAL DATA:  Wound infection. EXAM: RIGHT TIBIA AND FIBULA - 2 VIEW COMPARISON:  May 12, 2022 FINDINGS: No acute fracture seen. Stable sideplate and screw fixation of the proximal tibia. Mildly sclerotic appearance of the midportion of the fibula. No cortical erosions seen. Soft tissue edema about the leg. IMPRESSION: 1. No acute fracture or dislocation identified about the right tibia or fibula. 2. Mildly sclerotic appearance of the midportion of the fibula, nonspecific. No cortical erosions seen. Appearance is equivocal for osteomyelitis. 3. Soft tissue edema. Electronically Signed   By: Fidela Salisbury M.D.   On: 06/12/2022 17:59        Scheduled Meds:  apixaban  5 mg Oral Q12H   atorvastatin  80 mg Oral QHS   calcitRIOL  0.25 mcg Oral Daily   carvedilol  25 mg Oral BID   Chlorhexidine Gluconate Cloth  6 each Topical Daily   ferrous sulfate  325 mg Oral Daily   folic acid  1 mg Oral Daily   insulin aspart  0-20 Units Subcutaneous TID WC   insulin aspart  0-5 Units Subcutaneous QHS   insulin glargine-yfgn  10 Units Subcutaneous Daily   torsemide  10 mg Oral Daily   Continuous Infusions:  piperacillin-tazobactam (ZOSYN)  IV Stopped (06/13/22 0620)   vancomycin       LOS: 1 day     Desma Maxim, MD [facility]   If 7PM-7AM, please contact night-coverage [email or web address] Password [facility], [place] 06/13/2022, 9:35 AM

## 2022-06-13 NOTE — Assessment & Plan Note (Signed)
Biopsy proven

## 2022-06-13 NOTE — Plan of Care (Signed)
  Problem: Skin Integrity: Goal: Skin integrity will improve Outcome: Progressing   Problem: Education: Goal: Knowledge of General Education information will improve Description: Including pain rating scale, medication(s)/side effects and non-pharmacologic comfort measures Outcome: Progressing   Problem: Health Behavior/Discharge Planning: Goal: Ability to manage health-related needs will improve Outcome: Progressing   Problem: Clinical Measurements: Goal: Ability to maintain clinical measurements within normal limits will improve Outcome: Progressing   Problem: Clinical Measurements: Goal: Will remain free from infection Outcome: Progressing   Problem: Activity: Goal: Risk for activity intolerance will decrease Outcome: Progressing   Problem: Nutrition: Goal: Adequate nutrition will be maintained Outcome: Progressing

## 2022-06-14 DIAGNOSIS — S81801A Unspecified open wound, right lower leg, initial encounter: Secondary | ICD-10-CM

## 2022-06-14 DIAGNOSIS — L089 Local infection of the skin and subcutaneous tissue, unspecified: Secondary | ICD-10-CM

## 2022-06-14 DIAGNOSIS — L02415 Cutaneous abscess of right lower limb: Secondary | ICD-10-CM | POA: Diagnosis not present

## 2022-06-14 DIAGNOSIS — L03115 Cellulitis of right lower limb: Secondary | ICD-10-CM | POA: Diagnosis not present

## 2022-06-14 LAB — CBC
HCT: 26.4 % — ABNORMAL LOW (ref 36.0–46.0)
Hemoglobin: 7.9 g/dL — ABNORMAL LOW (ref 12.0–15.0)
MCH: 28.3 pg (ref 26.0–34.0)
MCHC: 29.9 g/dL — ABNORMAL LOW (ref 30.0–36.0)
MCV: 94.6 fL (ref 80.0–100.0)
Platelets: 186 10*3/uL (ref 150–400)
RBC: 2.79 MIL/uL — ABNORMAL LOW (ref 3.87–5.11)
RDW: 15.6 % — ABNORMAL HIGH (ref 11.5–15.5)
WBC: 9.8 10*3/uL (ref 4.0–10.5)
nRBC: 0 % (ref 0.0–0.2)

## 2022-06-14 LAB — BASIC METABOLIC PANEL
Anion gap: 13 (ref 5–15)
BUN: 45 mg/dL — ABNORMAL HIGH (ref 8–23)
CO2: 23 mmol/L (ref 22–32)
Calcium: 8 mg/dL — ABNORMAL LOW (ref 8.9–10.3)
Chloride: 99 mmol/L (ref 98–111)
Creatinine, Ser: 6.28 mg/dL — ABNORMAL HIGH (ref 0.44–1.00)
GFR, Estimated: 7 mL/min — ABNORMAL LOW (ref >60)
Glucose, Bld: 122 mg/dL — ABNORMAL HIGH (ref 70–99)
Potassium: 4.9 mmol/L (ref 3.5–5.1)
Sodium: 135 mmol/L (ref 135–145)

## 2022-06-14 LAB — GLUCOSE, CAPILLARY
Glucose-Capillary: 89 mg/dL (ref 70–99)
Glucose-Capillary: 135 mg/dL — ABNORMAL HIGH (ref 70–99)
Glucose-Capillary: 124 mg/dL — ABNORMAL HIGH (ref 70–99)

## 2022-06-14 LAB — HIV ANTIBODY (ROUTINE TESTING W REFLEX): HIV Screen 4th Generation wRfx: NONREACTIVE

## 2022-06-14 LAB — HEPATITIS B SURFACE ANTIGEN: Hepatitis B Surface Ag: NONREACTIVE

## 2022-06-14 LAB — PHOSPHORUS: Phosphorus: 4.6 mg/dL (ref 2.5–4.6)

## 2022-06-14 MED ORDER — SODIUM CHLORIDE 0.9 % IV SOLN
1.0000 g | Freq: Once | INTRAVENOUS | Status: DC
  Filled 2022-06-14: qty 1

## 2022-06-14 MED ORDER — GLYCERIN (LAXATIVE) 2 G RE SUPP
1.0000 | Freq: Every day | RECTAL | Status: DC | PRN
  Administered 2022-06-14: 1 via RECTAL
  Filled 2022-06-14 (×4): qty 1

## 2022-06-14 MED ORDER — ALUM & MAG HYDROXIDE-SIMETH 200-200-20 MG/5ML PO SUSP: 15.0000 mL | Freq: Four times a day (QID) | ORAL | Status: DC | PRN

## 2022-06-14 MED ORDER — POLYETHYLENE GLYCOL 3350 17 G PO PACK
34.0000 g | PACK | Freq: Every day | ORAL | Status: DC
  Administered 2022-06-14 – 2022-06-17 (×2): 34 g via ORAL
  Filled 2022-06-14 (×4): qty 2

## 2022-06-14 MED ORDER — VANCOMYCIN HCL IN DEXTROSE 1-5 GM/200ML-% IV SOLN
1000.0000 mg | INTRAVENOUS | Status: DC
  Administered 2022-06-14 – 2022-06-16 (×2): 1000 mg via INTRAVENOUS
  Filled 2022-06-14 (×5): qty 200

## 2022-06-14 MED ORDER — INSULIN GLARGINE-YFGN 100 UNIT/ML ~~LOC~~ SOLN
10.0000 [IU] | Freq: Every day | SUBCUTANEOUS | Status: DC
  Administered 2022-06-14 – 2022-06-16 (×3): 10 [IU] via SUBCUTANEOUS
  Filled 2022-06-14 (×3): qty 0.1

## 2022-06-14 MED ORDER — SIMETHICONE 80 MG PO CHEW
80.0000 mg | CHEWABLE_TABLET | Freq: Once | ORAL | Status: AC
  Administered 2022-06-14: 80 mg via ORAL
  Filled 2022-06-14 (×2): qty 1

## 2022-06-14 MED ORDER — SODIUM CHLORIDE 0.9 % IV SOLN: 500.0000 mg | INTRAVENOUS | Status: DC

## 2022-06-14 MED ORDER — MEDIHONEY WOUND/BURN DRESSING EX PSTE
1.0000 | PASTE | Freq: Every day | CUTANEOUS | Status: DC
  Administered 2022-06-14 – 2022-06-20 (×7): 1 via TOPICAL
  Filled 2022-06-14: qty 44

## 2022-06-14 NOTE — Progress Notes (Signed)
PROGRESS NOTE    Jody Nelson  UEA:[id number] DOB: 1950-07-17 DOA: 06/12/2022 PCP: Patient, No Pcp Per     Brief Narrative:   From admission h and p Jody Nelson is a 72 y.o. 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  On the day of arrival she was having a catch-up dialysis session which had to be terminated early due to inability to prop up her leg because of the pain.  She denies fever or chills.  She denies chest pain or shortness of breath.    Assessment & Plan:   Principal Problem:   Cellulitis and abscess of right leg Active Problems:   Non-healing wound of right lower extremity   Lymphedema of both lower extremities   Uncontrolled type 2 diabetes mellitus with hyperglycemia, with long-term current use of insulin (HCC)   ESRD on hemodialysis (HCC)   Chronic anticoagulation   Hypercoagulable state (positive anticardiolipin, elevated homocysteine, factor V Leiden deficiency)   (HFpEF) heart failure with preserved ejection fraction (HCC)   H/O: CVA (cerebrovascular accident)   History of obstructive sleep apnea   Essential hypertension   Morbid obesity with BMI of 50.0-59.9, adult (HCC)   Sarcoidosis   Anemia of chronic renal failure   Decubitus ulcer of heel, bilateral   Cellulitis/abscess of right leg without foot Nonhealing wound right shin/possible  osteomyelitis History of right tibial plateau fracture 2019 with hardware and chronic wound infection Chronic lymphedema Infected hardware that leg 2019-2020. Daughter says that healed but has had wound after fall RLE since 04/2022 and has had worsening wounds since then. At baseline moves very little, mainly gets around with a motorized wheelchair. Purulence draining from sinus track in right leg, culture sent from probe that went in a good inch.  MRI and CT do not show signs of osteo Unfortunately abx started prior to blood cultures   Continue Zosyn and vancomycin Ortho to see, given complicated infection history that leg will also ask ID to opine Wound care consulted   Uncontrolled type 2 diabetes mellitus with hyperglycemia, with long-term current use of insulin (HCC) Blood sugar on arrival was 326 improved to normal. A1c 8.7 Continue basal insulin but will decrease to 10 qhs Sliding scale coverage   ESRD on hemodialysis [facility]) Nephrology consult for continuation of dialysis, gets mwf at home   Chronic anticoagulation Hypercoagulability 2020 (positive anticardiolipin, elevated homocysteine, factor V Leiden deficiency) h/o multiple cryptogenic CVAs -Patient underwent ability workup in March 2020 for cryptogenic strokes remarkable for low positive anticardiolipin IgM, elevated homocysteine, and Factor V Leiden Deficiency. No h/o venous blood clots.  -ASA was d/c'ed and she was started on Eliquis  -Continue Eliquis   Decubitus ulcer of heel, bilateral Wound care consulted   Anemia of chronic renal failure Hemoglobin 9.4 on presentation, down from 12.3 a month prior.  Today 7.9 from 8 yesterday, no melena or other bleeding - Continue to trend  Morbid obesity with BMI of 50.0-59.9, adult (HCC) Complicating factor to overall prognosis and care   Essential hypertension Blood pressure controlled Continue carvedilol   History of obstructive sleep apnea CPAP nightly   H/O: CVA  (cerebrovascular accident) History of multiple CVAs with residual right-sided weakness Continue atorvastatin and apixaban   (HFpEF) heart failure with preserved ejection fraction (HCC) Clinically euvolemic Continue Demadex, carvedilol   DVT prophylaxis: apixaban Code Status: full Family Communication: daughter updated telephonically 2/5  Level of care: Med-Surg Status is: Inpatient Remains inpatient appropriate because: severity of illness Dispo: return to snf ([facility]) where she resides    Consultants:  Ortho, ID  Procedures: none  Antimicrobials:  Vanc/zosyn    Subjective: Right leg pain mild  Objective: Vitals:   06/13/22 1607 06/14/22 0023 06/14/22 0730 06/14/22 0748  BP: (!) 143/59 (!) 120/46 (!) 117/55 (!) 107/54  Pulse: 82 84 84   Resp: 16 18 14    Temp: 98.7 F (37.1 C) 98 F (36.7 C) 99 F (37.2 C)   TempSrc:   Oral   SpO2: 100% 100% 96%   Weight:      Height:        Intake/Output Summary (Last 24 hours) at 06/14/2022 1013 Last data filed at 06/14/2022 0300 Gross per 24 hour  Intake 100 ml  Output --  Net 100 ml   Filed Weights   06/12/22 1710  Weight: 135.6 kg    Examination:  General exam: Appears calm and comfortable  Respiratory system: normal wob Cardiovascular system: S1 & S2 heard, soft systolic murmur Gastrointestinal system: Abdomen is obese, soft and nontender. No organomegaly or masses felt. Normal bowel sounds heard. Central nervous system: Alert and oriented. No focal neurological deficits. Extremities: Symmetric 5 x 5 power. Skin: ulcers right leg are dressed, surrounding induration Psychiatry: Judgement and insight appear normal. Mood & affect appropriate.     Data Reviewed: I have personally reviewed following labs and imaging studies  CBC: Recent Labs  Lab 06/12/22 1936 06/13/22 0803 06/14/22 0257  WBC 16.4* 10.9* 9.8  NEUTROABS 13.0*  --   --   HGB 9.4* 8.0* 7.9*  HCT 32.0* 26.3* 26.4*  MCV 97.0 95.3 94.6   PLT 164 146* 546   Basic Metabolic Panel: Recent Labs  Lab 06/12/22 1936 06/13/22 0803 06/14/22 0257  NA 134* 136 135  K 4.5 4.4 4.9  CL 99 105 99  CO2 22 23 23   GLUCOSE 276* 281* 122*  BUN 33* 37* 45*  CREATININE 5.46* 5.17* 6.28*  CALCIUM 8.4* 7.0* 8.0*  PHOS  --   --  4.6   GFR: Estimated Creatinine Clearance: 11.6 mL/min (A) (by C-G formula based on SCr of 6.28 mg/dL (H)). Liver Function Tests: No results for input(s): "AST", "ALT", "ALKPHOS", "BILITOT", "PROT", "ALBUMIN" in the last 168 hours. No results for input(s): "LIPASE", "AMYLASE" in the last 168 hours. No results for input(s): "AMMONIA" in the last 168 hours. Coagulation Profile: No results for input(s): "INR", "PROTIME" in the last 168 hours. Cardiac Enzymes: No results for input(s): "CKTOTAL", "CKMB", "CKMBINDEX", "TROPONINI" in the last 168 hours. BNP (last 3 results) No results for input(s): "PROBNP" in the last 8760 hours. HbA1C: Recent Labs    06/13/22 0803  HGBA1C 8.7*   CBG: Recent Labs  Lab 06/13/22 0736 06/13/22 1159 06/13/22 1716 06/13/22 2106 06/14/22 0756  GLUCAP 257* 206* 136* 89 89   Lipid Profile: No results for input(s): "CHOL", "HDL", "LDLCALC", "TRIG", "CHOLHDL", "LDLDIRECT" in the last 72  hours. Thyroid Function Tests: No results for input(s): "TSH", "T4TOTAL", "FREET4", "T3FREE", "THYROIDAB" in the last 72 hours. Anemia Panel: No results for input(s): "VITAMINB12", "FOLATE", "FERRITIN", "TIBC", "IRON", "RETICCTPCT" in the last 72 hours. Urine analysis: No results found for: "COLORURINE", "APPEARANCEUR", "LABSPEC", "PHURINE", "GLUCOSEU", "HGBUR", "BILIRUBINUR", "KETONESUR", "PROTEINUR", "UROBILINOGEN", "NITRITE", "LEUKOCYTESUR" Sepsis Labs: @LABRCNTIP (procalcitonin:4,lacticidven:4)  ) Recent Results (from the past 240 hour(s))  MRSA Next Gen by PCR, Nasal     Status: None   Collection Time: 06/13/22 12:45 AM   Specimen: Nasal Mucosa; Nasal Swab  Result Value Ref Range  Status   MRSA by PCR Next Gen NOT DETECTED NOT DETECTED Final    Comment: (NOTE) The GeneXpert MRSA Assay (FDA approved for NASAL specimens only), is one component of a comprehensive MRSA colonization surveillance program. It is not intended to diagnose MRSA infection nor to guide or monitor treatment for MRSA infections. Test performance is not FDA approved in patients less than 7 years old. Performed at [facility], [address] [place], [place] [address]   Culture, blood (Routine X 2) w Reflex to ID Panel     Status: None (Preliminary result)   Collection Time: 06/13/22  9:34 AM   Specimen: BLOOD  Result Value Ref Range Status   Specimen Description BLOOD BLOOD LEFT HAND  Final   Special Requests   Final    BOTTLES DRAWN AEROBIC AND ANAEROBIC Blood Culture adequate volume   Culture   Final    NO GROWTH < 24 HOURS Performed at [facility], [address], [place], [place] [address]    Report Status PENDING  Incomplete  Aerobic Culture w Gram Stain (superficial specimen)     Status: None (Preliminary result)   Collection Time: 06/13/22  9:35 AM   Specimen: Abscess  Result Value Ref Range Status   Specimen Description   Final    ABSCESS S T Performed at [facility], [address], [place], [place] [address]    Special Requests   Final    NONE Performed at [facility], [address] [place], [place] [address]    Gram Stain   Final    FEW GRAM POSITIVE COCCI IN SINGLES IN PAIRS RARE GRAM VARIABLE ROD NO WBC SEEN    Culture   Final    FEW GRAM NEGATIVE RODS CULTURE REINCUBATED FOR BETTER GROWTH SUSCEPTIBILITIES TO FOLLOW Performed at [facility], [place] [address] [place], [place] [address]    Report Status PENDING  Incomplete  Culture, blood (Routine X 2) w Reflex to ID Panel     Status: None (Preliminary result)   Collection Time: 06/13/22  2:23 PM   Specimen: BLOOD  Result Value Ref Range Status   Specimen  Description BLOOD BLOOD RIGHT FOREARM  Final   Special Requests   Final    BOTTLES DRAWN AEROBIC AND ANAEROBIC Blood Culture adequate volume   Culture   Final    NO GROWTH < 24 HOURS Performed at [facility], [address] [place], [place] [address]    Report Status PENDING  Incomplete         Radiology Studies: CT EXTREMITY LOWER RIGHT WO CONTRAST  Result Date: 06/13/2022 CLINICAL DATA:  Soft tissue infection suspected. Cellulitis. Evaluate for drainable pocket. EXAM: CT OF THE LOWER RIGHT EXTREMITY WITHOUT CONTRAST TECHNIQUE: Multidetector CT imaging of the right lower extremity was performed according to the standard protocol. RADIATION DOSE REDUCTION: This exam was performed according to the departmental  dose-optimization program which includes automated exposure control, adjustment of the mA and/or kV according to patient size and/or use of iterative reconstruction technique. COMPARISON:  None Available. FINDINGS: Bones/Joint/Cartilage No evidence of fracture or dislocation. Multiple lucencies in the distal femur as well as scattered lucencies in the tibia suggesting osteopenia. Plate and screw fixation of the medial aspect of the proximal tibia. Ligaments Suboptimally assessed by CT. Muscles and Tendons Generalized muscle atrophy. No intramuscular fluid collection or abscess. Soft tissues Marked skin thickening and subcutaneous soft tissue edema. There are multiple punctate foci of gas in the posterior aspect of the mid to distal calf without evidence of drainable fluid collection or abscess. IMPRESSION: 1. Marked skin thickening and subcutaneous soft tissue edema consistent with cellulitis. No evidence of drainable fluid collection or abscess. 2. Multiple punctate foci of gas in the posterior aspect of the mid to distal calf without evidence of drainable fluid collection or abscess. 3. No evidence of fracture or dislocation. 4. Multiple lucencies in the distal femur and tibia  suggesting osteopenia. Electronically Signed   By: Keane Police D.O.   On: 06/13/2022 23:02   MR TIBIA FIBULA RIGHT W WO CONTRAST  Result Date: 06/13/2022 CLINICAL DATA:  Lower leg soft tissue infection EXAM: MRI OF LOWER RIGHT EXTREMITY WITHOUT AND WITH CONTRAST TECHNIQUE: Multiplanar, multisequence MR imaging of the right tibia/fib was performed both before and after administration of intravenous contrast. CONTRAST:  48mL GADAVIST GADOBUTROL 1 MMOL/ML IV SOLN COMPARISON:  Radiographs 06/12/2022 FINDINGS: Bones/Joint/Cartilage No findings of osteomyelitis in the tibia or fibula. Proximal tibia obscured by artifact related to the plate and screw fixator. Specifically, the mid fibular region of concern on radiography appears normal. Ligaments N/A Muscles and Tendons Prominent regional muscular atrophy. No significant muscular enhancement indicate myositis. Soft tissues Substantial diffuse subcutaneous edema circumferentially in the calf. There is little in the way of associated enhancement and accordingly the appearance is not considered definitive or specific for cellulitis as opposed to other causes of subcutaneous edema. No drainable abscess. IMPRESSION: 1. Substantial diffuse subcutaneous edema in the calf, with little in the way of associated enhancement and accordingly the appearance is not considered definitive or specific for cellulitis as opposed to other causes of subcutaneous edema. No drainable abscess or osteomyelitis. 2. Prominent regional muscular atrophy. Electronically Signed   By: Van Clines M.D.   On: 06/13/2022 12:36   DG Tibia/Fibula Right  Result Date: 06/12/2022 CLINICAL DATA:  Wound infection. EXAM: RIGHT TIBIA AND FIBULA - 2 VIEW COMPARISON:  May 12, 2022 FINDINGS: No acute fracture seen. Stable sideplate and screw fixation of the proximal tibia. Mildly sclerotic appearance of the midportion of the fibula. No cortical erosions seen. Soft tissue edema about the leg.  IMPRESSION: 1. No acute fracture or dislocation identified about the right tibia or fibula. 2. Mildly sclerotic appearance of the midportion of the fibula, nonspecific. No cortical erosions seen. Appearance is equivocal for osteomyelitis. 3. Soft tissue edema. Electronically Signed   By: Fidela Salisbury M.D.   On: 06/12/2022 17:59        Scheduled Meds:  apixaban  5 mg Oral Q12H   atorvastatin  80 mg Oral QHS   calcitRIOL  0.25 mcg Oral Daily   carvedilol  25 mg Oral BID   Chlorhexidine Gluconate Cloth  6 each Topical Daily   ferrous sulfate  325 mg Oral Daily   folic acid  1 mg Oral Daily   gabapentin  200 mg Oral BID   insulin  aspart  0-20 Units Subcutaneous TID WC   insulin aspart  0-5 Units Subcutaneous QHS   insulin glargine-yfgn  10 Units Subcutaneous QHS   leptospermum manuka honey  1 Application Topical Daily   torsemide  10 mg Oral Daily   Continuous Infusions:  anticoagulant sodium citrate     piperacillin-tazobactam (ZOSYN)  IV 2.25 g (06/14/22 0536)   vancomycin       LOS: 2 days     Desma Maxim, MD [facility]   If 7PM-7AM, please contact night-coverage [email or web address] Password [email or web address] 06/14/2022, 10:13 AM

## 2022-06-14 NOTE — Consult Note (Signed)
Jody Nelson for Infectious Disease    Date of Admission:  06/12/2022     Reason for Consult: Wound infection     Referring Physician: Dr Si Raider  Current antibiotics: Vancomycin Zosyn    ASSESSMENT:    72 y.o. female admitted with:  Cellulitis and infected non-healing wound of right leg: Healing likely complicated by chronic lymphedema and overall frail status (albumin in January 2.9).  No evidence of osteomyelitis noted on imaging this admission.  Wound cultures obtained from deep probing of her wound is polymicrobial with Morganella isolated thus far.  Hx of right tibial plateau fracture: This was complicated by osteomyelitis and hardware infection in Nov/Dec 2019.  Cultures at that time grew MRSA and Proteus treated with 6-weeks of Vancomycin and Ceftriaxone IV along with PO Metronidazole followed by Doxycycline and Cefadroxil for planned indefinite suppression.  She was followed by ID at [facility] but has not been seen since June 2020.  Hardware remains in place, but at some point in time it seems that her antibiotic suppression fell off as it is not on her nursing home [facility]. Bilateral heel pressure ulcers: Wound care consulted History of MDR Klebsiella colonization: Noted in Oct 2022. ESRD on HD Type 2 DM: A1c is 8.7. Morbid obesity: Body mass index is 51.32 kg/m.   RECOMMENDATIONS:    Continue vancomycin with HD Change zosyn to ertapenem given history of MDR colonization and antibiotic exposure.  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  Not sure if ongoing suppression will be needed at this time Suspect she would benefit from better  management of her lymphedema.  Possibly referral to lymphedema clinic following discharge? Following   Principal Problem:   Cellulitis and abscess of right leg Active Problems:   (HFpEF) heart failure with preserved ejection fraction (HCC)   Lymphedema of both lower extremities   H/O: CVA (cerebrovascular accident)   History of obstructive sleep apnea   Essential hypertension   Morbid obesity with BMI of 50.0-59.9, adult (HCC)   Sarcoidosis   Uncontrolled type 2 diabetes mellitus with hyperglycemia, with long-term current use of insulin (HCC)   ESRD on hemodialysis (HCC)   Anemia of chronic renal failure   Non-healing wound of right lower extremity   Chronic anticoagulation   Decubitus ulcer of heel, bilateral   Hypercoagulable state (positive anticardiolipin, elevated homocysteine, factor V Leiden deficiency)   MEDICATIONS:    Scheduled Meds:  apixaban  5 mg Oral Q12H   atorvastatin  80 mg Oral QHS   calcitRIOL  0.25 mcg Oral Daily   carvedilol  25 mg Oral BID   Chlorhexidine Gluconate Cloth  6 each Topical Daily   ferrous sulfate  325 mg Oral Daily   folic acid  1 mg Oral Daily   gabapentin  200 mg Oral BID   insulin aspart  0-20 Units Subcutaneous TID WC   insulin aspart  0-5 Units Subcutaneous QHS   insulin glargine-yfgn  10 Units Subcutaneous QHS   leptospermum manuka honey  1 Application Topical Daily   polyethylene glycol  34 g Oral Daily   torsemide  10 mg Oral Daily   Continuous Infusions:  anticoagulant sodium citrate     ertapenem     [  START ON 06/15/2022] ertapenem     vancomycin     PRN Meds:.acetaminophen **OR** acetaminophen, albuterol, alteplase, anticoagulant sodium citrate, Glycerin (Adult), heparin, HYDROcodone-acetaminophen, HYDROmorphone (DILAUDID) injection, lidocaine (PF), lidocaine-prilocaine, ondansetron **OR** ondansetron (ZOFRAN) IV, pentafluoroprop-tetrafluoroeth, traZODone  HPI:    Jody Nelson is a 72 y.o. female with complicated past  medical history as below including right tib plateau fracture in 4854 complicated by hardware infection and OM, chronic lymphedema/venous stasis and chronic wounds previously followed at the wound clinic and now getting care at her nursing facility who presented to [facility] over the weekend with worsening pain and redness around a chronic non-healing wound that has been present for about 2 months and non-responsive to prior antibiotic courses.  She denies fevers, chills but has been having substantial pain in her leg.  She was afebrile on admission but noted to have leukocytosis.  She was started on antibiotics and blood cx obtained after she received antibiotics.  Per the notes, patient had a fall in December 2023 with resultant wound that has worsened since that time.  There was purulence noted to be draining from a sinus tract in the leg.  This was cultured from probing the tract that went fairly deep (at least an inch per TRH).  She also had imaging showing skin thickening and subcutaneous soft tissue edema but no definite abscess or fluid collections.  There was no convincing evidence of osteomyelitis, either.  She has thus far been managed with vancomycin and zosyn. She does have a history of MDRO UTI in the past.    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   At high risk for falls    Atypical chest pain    Bilateral lower extremity edema    Chronic right sided weakness    Cryptogenic stroke ([place]) 02/27/2011   a.) MRI brain 02/27/2011: old lacunar infarct in RIGHT  pons and medulla oblongata   Cryptogenic stroke ([place]) 10/11/2012   a.) CT brain 10/11/2012: tiny old lacunar infarct in head of caudate nucleus (left)   Cryptogenic stroke ([place]) 10/12/2012   a.) MRI brain 10/12/2012: multiple acute LEFT hemispheric infarcts   Cryptogenic stroke ([place]) 06/26/2014   a.) MRI brain 06/26/2014: acute RIGHT posterior frontal lobe infarct involving both and grey matter   Cryptogenic stroke ([place]) 07/29/2015   a.) MRI brain 07/29/2015: 2 foci of diffusion restriction in the LEFT parietal lobe consistent with acute infarction   Cryptogenic stroke ([place]) 07/24/2017   a.) MRI brain 07/24/2017: punctate focus of acute ischemia in the medial LEFT temporal lobe   DDD (degenerative disc disease), cervical    Depression    DOE (dyspnea on exertion)    Dysarthria as late effect of stroke    ESRD (end stage renal disease) on dialysis ([place])    a.) M-W-F   GERD (gastroesophageal reflux disease)    Hallux valgus, bilateral    Heterozygous factor V Leiden mutation ([place]) 07/18/2018   History of cardiac catheterization 08/30/2008   a.) LHC 08/30/2008: EF >60%, LVEDP 18 mmHg, normal coronaries.  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   a.) 07/18/2018 --> 22 mol/L   Hypertension    Insomnia    a.) melatonin + trazodone PRN   Long term current use of anticoagulant    a.) apixaban   Lymphedema of both lower extremities    Meralgia paresthetica, left lower limb    OSA (obstructive sleep apnea)    a.) does not require nocturnal PAP therapy   Osteoarthritis    Pneumonia    Pseudophakia of both eyes    Sarcoidosis    Secondary hyperparathyroidism of renal origin ([place])    Type 2 diabetes mellitus treated with insulin  ([place])    Vitamin D deficiency     Social History   Tobacco Use   Smoking status: Former    Types: Cigarettes   Smokeless tobacco: Never  Substance Use Topics   Alcohol use: Never   Drug use: Never    Family History  Problem Relation Age of Onset   Diabetes Mother    Colon cancer Sister    Diabetes Sister    Cancer Maternal Uncle    Clotting disorder Paternal Grandmother    Colon cancer Paternal Grandfather     Allergies  Allergen Reactions   Sulfamethoxazole-Trimethoprim     Other reaction(s): Kidney Disorder Other reaction(s): Kidney Disorder Hyperkalemia, AKI Hyperkalemia, AKI     Review of Systems  All other systems reviewed and are negative.  Except as noted above.  OBJECTIVE:   Blood pressure (!) 107/54, pulse 84, temperature 99 F (37.2 C), temperature source Oral, resp. rate 14, height 5\' 4"  (1.626 m), weight 135.6 kg, SpO2 96 %. Body mass index is 51.32 kg/m.  Physical Exam Constitutional:      General: She is not in acute distress.    Appearance: Normal appearance.  HENT:     Head: Normocephalic and atraumatic.  Eyes:     Extraocular Movements: Extraocular movements intact.     Conjunctiva/sclera: Conjunctivae normal.  Cardiovascular:     Rate and Rhythm: Normal rate and regular rhythm.     Comments: RIght IJ HD catheter in place.  Pulmonary:     Effort: Pulmonary effort is normal. No respiratory distress.  Abdominal:     General: There is no distension.     Palpations: Abdomen is soft.  Musculoskeletal:     Right lower leg: Edema present.     Left lower leg: Edema present.     Comments: Wounds noted to right lower extremity with chronic lymphedema.   Skin:    General: Skin is warm and dry.     Findings: No rash.  Neurological:     General: No focal deficit present.     Mental Status: She is alert and oriented to person, place, and time.  Psychiatric:        Mood and Affect: Mood normal.        Behavior: Behavior normal.            Lab Results: Lab Results  Component Value Date   WBC 9.8 06/14/2022   HGB 7.9 (L) 06/14/2022   HCT 26.4 (L) 06/14/2022   MCV 94.6 06/14/2022   PLT 186 06/14/2022    Lab Results  Component Value Date   NA 135 06/14/2022   K 4.9  06/14/2022   CO2 23 06/14/2022   GLUCOSE 122 (H) 06/14/2022   BUN 45 (H) 06/14/2022   CREATININE 6.28 (H) 06/14/2022   CALCIUM 8.0 (L) 06/14/2022   GFRNONAA 7 (L) 06/14/2022    Lab Results  Component Value Date   ALT 8 05/11/2022   AST 14 (L) 05/11/2022   ALKPHOS 76 05/11/2022   BILITOT 0.7 05/11/2022       Component Value Date/Time   CRP 22.0 (H) 06/13/2022 1026       Component Value Date/Time   ESRSEDRATE 128 (H) 06/13/2022 0803    I have reviewed the micro and lab results in Epic.  Imaging: CT EXTREMITY LOWER RIGHT WO CONTRAST  Result Date: 06/13/2022 CLINICAL DATA:  Soft tissue infection suspected. Cellulitis. Evaluate for drainable pocket. EXAM: CT OF THE LOWER RIGHT EXTREMITY WITHOUT CONTRAST TECHNIQUE: Multidetector CT imaging of the right lower extremity was performed according to the standard protocol. RADIATION DOSE REDUCTION: This exam was performed according to the departmental dose-optimization program which includes automated exposure control, adjustment of the mA and/or kV according to patient size and/or use of iterative reconstruction technique. COMPARISON:  None Available. FINDINGS: Bones/Joint/Cartilage No evidence of fracture or dislocation. Multiple lucencies in the distal femur as well as scattered lucencies in the tibia suggesting osteopenia. Plate and screw fixation of the medial aspect of the proximal tibia. Ligaments Suboptimally assessed by CT. Muscles and Tendons Generalized muscle atrophy. No intramuscular fluid collection or abscess. Soft tissues Marked skin thickening and subcutaneous soft tissue edema. There are multiple punctate foci of gas in the posterior aspect of the mid to distal calf without  evidence of drainable fluid collection or abscess. IMPRESSION: 1. Marked skin thickening and subcutaneous soft tissue edema consistent with cellulitis. No evidence of drainable fluid collection or abscess. 2. Multiple punctate foci of gas in the posterior aspect of the mid to distal calf without evidence of drainable fluid collection or abscess. 3. No evidence of fracture or dislocation. 4. Multiple lucencies in the distal femur and tibia suggesting osteopenia. Electronically Signed   By: Keane Police D.O.   On: 06/13/2022 23:02   MR TIBIA FIBULA RIGHT W WO CONTRAST  Result Date: 06/13/2022 CLINICAL DATA:  Lower leg soft tissue infection EXAM: MRI OF LOWER RIGHT EXTREMITY WITHOUT AND WITH CONTRAST TECHNIQUE: Multiplanar, multisequence MR imaging of the right tibia/fib was performed both before and after administration of intravenous contrast. CONTRAST:  64mL GADAVIST GADOBUTROL 1 MMOL/ML IV SOLN COMPARISON:  Radiographs 06/12/2022 FINDINGS: Bones/Joint/Cartilage No findings of osteomyelitis in the tibia or fibula. Proximal tibia obscured by artifact related to the plate and screw fixator. Specifically, the mid fibular region of concern on radiography appears normal. Ligaments N/A Muscles and Tendons Prominent regional muscular atrophy. No significant muscular enhancement indicate myositis. Soft tissues Substantial diffuse subcutaneous edema circumferentially in the calf. There is little in the way of associated enhancement and accordingly the appearance is not considered definitive or specific for cellulitis as opposed to other causes of subcutaneous edema. No drainable abscess. IMPRESSION: 1. Substantial diffuse subcutaneous edema in the calf, with little in the way of associated enhancement and accordingly the appearance is not considered definitive or specific for cellulitis as opposed to other causes of subcutaneous edema. No drainable abscess or osteomyelitis. 2. Prominent regional muscular atrophy.  Electronically Signed   By: Van Clines M.D.   On: 06/13/2022 12:36   DG Tibia/Fibula Right  Result Date: 06/12/2022 CLINICAL DATA:  Wound infection. EXAM: RIGHT TIBIA AND FIBULA -  2 VIEW COMPARISON:  May 12, 2022 FINDINGS: No acute fracture seen. Stable sideplate and screw fixation of the proximal tibia. Mildly sclerotic appearance of the midportion of the fibula. No cortical erosions seen. Soft tissue edema about the leg. IMPRESSION: 1. No acute fracture or dislocation identified about the right tibia or fibula. 2. Mildly sclerotic appearance of the midportion of the fibula, nonspecific. No cortical erosions seen. Appearance is equivocal for osteomyelitis. 3. Soft tissue edema. Electronically Signed   By: Fidela Salisbury M.D.   On: 06/12/2022 17:59     Imaging independently reviewed in Epic.  Raynelle Highland for Infectious Disease [facility] [phone] pager 06/14/2022, 1:54 PM  I have personally spent 80 minutes involved in face-to-face and non-face-to-face activities for this patient on the day of the visit. Professional time spent includes the following activities: Preparing to see the patient (review of tests), Obtaining and/or reviewing separately obtained history (admission/discharge record), Performing a medically appropriate examination and/or evaluation , Ordering medications/tests/procedures, referring and communicating with other health care professionals, Documenting clinical information in the EMR, Independently interpreting results (not separately reported), Communicating results to the patient/family/caregiver, Counseling and educating the patient/family/caregiver and Care coordination (not separately reported).

## 2022-06-14 NOTE — Consult Note (Signed)
ORTHOPAEDIC CONSULTATION  REQUESTING PHYSICIAN: Wouk, Ailene Rud, MD  Chief Complaint:   R leg draining wound  History of Present Illness: History obtained from patient, her daughter over the phone, chart review, and discussion with medical providers.  Jody Nelson is a 72 y.o. female with complex PMHx including end-stage renal disease on dialysis, uncontrolled diabetes, lower extremity lymphedema/venous stasis, morbid obesity , and hypercoagulable state with prior CVAs, who presents with an approximately 28-month history of right leg draining wounds.  She lives at [facility]. She has had dramatically decreased mobility over the past few months and does not ambulate much. She has been treated with wound care and oral antibiotics without improvement. She developed increased pain recently.  Of note, she had a prior ORIF R tibial plateau in 9390 complicated by wound infection and treated with I&D and wound VAC. Plan had been for chronic suppression with antibiotics, but this was stopped for unknown reasons.   Patient states that her wound had healed after the surgery for a few years until she started to develop more anterior/lateral leg ulceration and draining wounds ~6 months ago. They became more purulent with increased drainage over the last few weeks. She developed a lot more pain at her last dialysis session.    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   At high risk for falls     Atypical chest pain    Bilateral lower extremity edema    Chronic right sided weakness    Cryptogenic stroke ([place]) 02/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   a.) MRI brain 02/27/2011: old lacunar infarct in RIGHT pons and medulla oblongata   Cryptogenic stroke ([place]) 10/11/2012   a.) CT brain 10/11/2012: tiny old lacunar infarct in head of caudate nucleus (left)   Cryptogenic stroke ([place]) 10/12/2012   a.) MRI brain 10/12/2012: multiple acute LEFT hemispheric infarcts   Cryptogenic stroke ([place]) 06/26/2014   a.) MRI brain 06/26/2014: acute RIGHT posterior frontal lobe infarct involving both and grey matter   Cryptogenic stroke ([place]) 07/29/2015   a.) MRI brain 07/29/2015: 2 foci of diffusion restriction in the LEFT parietal lobe consistent with acute infarction   Cryptogenic stroke ([place]) 07/24/2017   a.) MRI brain 07/24/2017: punctate focus of acute ischemia in the medial LEFT temporal lobe   DDD (degenerative disc disease), cervical    Depression    DOE (dyspnea on exertion)    Dysarthria as late effect of stroke    ESRD (end stage renal disease) on dialysis ([place])    a.) M-W-F   GERD (gastroesophageal reflux disease)    Hallux valgus, bilateral    Heterozygous factor V Leiden mutation ([place]) 07/18/2018   History of cardiac catheterization 08/30/2008   a.) LHC 08/30/2008: EF >60%, LVEDP 18 mmHg, normal coronaries.   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   a.) 07/18/2018 --> 22  mol/L   Hypertension    Insomnia    a.) melatonin + trazodone PRN   Long term current use of anticoagulant    a.) apixaban   Lymphedema of both lower extremities    Meralgia paresthetica, left lower limb    OSA (obstructive sleep apnea)    a.) does not require nocturnal PAP therapy    Osteoarthritis    Pneumonia    Pseudophakia of both eyes    Sarcoidosis    Secondary hyperparathyroidism of renal origin ([place])    Type 2 diabetes mellitus treated with insulin ([place])    Vitamin D deficiency    Past Surgical History:  Procedure Laterality Date   AV FISTULA PLACEMENT Left 04/16/2022   Procedure: INSERTION OF ARTERIOVENOUS (AV) GORE-TEX GRAFT ARM (BRACHIAL AXILLARY);  Surgeon: Katha Cabal, MD;  Location: [facility];  Service: Vascular;  Laterality: Left;   CESAREAN SECTION     DIALYSIS/PERMA CATHETER INSERTION N/A 10/14/2021   Procedure: DIALYSIS/PERMA CATHETER INSERTION;  Surgeon: Algernon Huxley, MD;  Location: [facility];  Service: Cardiovascular;  Laterality: N/A;   FRACTURE SURGERY     leg right fracture several surgeries, rods   Social History   Socioeconomic History   Marital status: Widowed    Spouse name: Not on file   Number of children: Not on file   Years of education: Not on file   Highest education level: Not on file  Occupational History   Not on file  Tobacco Use   Smoking status: Former    Types: Cigarettes   Smokeless tobacco: Never  Substance and Sexual Activity   Alcohol use: Never   Drug use: Never   Sexual activity: Not on file  Other Topics Concern   Not on file  Social History Narrative   Not on file   Social Determinants of Health   Financial Resource Strain: Not on file  Food Insecurity: No Food Insecurity (06/12/2022)   Hunger Vital Sign    Worried About Running Out of Food in the Last Year: Never true    [place] in the Last Year: Never true  Transportation Needs: No Transportation Needs (06/12/2022)   PRAPARE - Hydrologist (Medical): No    Lack of Transportation (Non-Medical): No  Physical Activity: Not on file  Stress: Not on file  Social Connections: Not on file   Family History  Problem Relation Age of Onset   Diabetes Mother    Colon cancer Sister    Diabetes Sister     Cancer Maternal Uncle    Clotting disorder Paternal Grandmother    Colon cancer Paternal Grandfather    Allergies  Allergen Reactions   Sulfamethoxazole-Trimethoprim     Other reaction(s): Kidney Disorder Other reaction(s): Kidney Disorder Hyperkalemia, AKI Hyperkalemia, AKI    Prior to Admission medications   Medication Sig Start Date End Date Taking? Authorizing Provider  ACIDOPHILUS LACTOBACILLUS PO Take 1 capsule by mouth 3 (three) times daily.   Yes [provider]  apixaban (ELIQUIS) 5 MG TABS tablet Take 1 tablet by mouth every 12 (twelve) hours. 08/16/18  Yes [provider]  atorvastatin (LIPITOR) 80 MG tablet Take 80 mg by mouth daily. 02/19/21  Yes [provider]  calcitRIOL (ROCALTROL) 0.25 MCG capsule Take 0.25 mcg by mouth daily. 10/22/21  Yes [provider]  calcium acetate (PHOSLO) 667 MG capsule Take one capsule by mouth twice daily with food on Mondays, Wednesdays, and Fridays.  Take one capsule 3 times daily on Tuesdays, Thursdays, Saturdays, and Sundays. 06/10/22  Yes [provider]  Calcium Carb-Cholecalciferol (OYSTER SHELL CALCIUM W/D) 500-5 MG-MCG TABS Take 1 tablet by mouth daily as needed. 02/19/21  Yes [provider]  carvedilol (COREG) 25 MG tablet Take 1 tablet by mouth 2 (two) times daily with a meal. 02/19/21  Yes [provider]  ferrous sulfate 324 (65 Fe) MG TBEC Take 1 tablet by mouth daily. 02/19/21  Yes [provider]  folic acid (FOLVITE) 1 MG tablet Take 1 mg by mouth daily. 02/19/21  Yes [provider]  gabapentin (NEURONTIN) 100 MG capsule Take 2 capsules by mouth 2 (two) times daily. 02/19/21  Yes [provider]  HYDROcodone-acetaminophen (NORCO) 5-325 MG tablet Take 1-2 tablets by mouth every 6 (six) hours as needed for moderate pain or severe pain. Patient taking differently: Take 1 tablet by mouth at bedtime. 04/16/22  Yes Schnier, Dolores Lory, MD   HYDROcodone-acetaminophen (NORCO/VICODIN) 5-325 MG tablet Take 1 tablet by mouth every 8 (eight) hours as needed for moderate pain.   Yes [provider]  HYDROcodone-acetaminophen (NORCO/VICODIN) 5-325 MG tablet Take one tablet by mouth 30 minutes prior to dressing change once daily as needed for pain.   Yes [provider]  insulin glargine (LANTUS) 100 UNIT/ML Solostar Pen Inject 10 Units into the skin at bedtime. 02/17/21  Yes [provider]  insulin lispro (HUMALOG) 100 UNIT/ML injection Inject 2-12 Units into the skin in the morning, at noon, and at bedtime. Per sliding scale   Yes [provider]  latanoprost (XALATAN) 0.005 % ophthalmic solution Place 1 drop into both eyes at bedtime. 02/08/22  Yes [provider]  LIDOCAINE PAIN RELIEF 4 % 1 patch daily. 12 hours on, 12 hours off. 11/25/21  Yes [provider]  montelukast (SINGULAIR) 10 MG tablet Take 10 mg by mouth daily. 02/19/21  Yes [provider]  Multiple Vitamins-Minerals (PRESERVISION AREDS) TABS Take 250 mg by mouth 2 (two) times daily.   Yes [provider]  omeprazole (PRILOSEC) 10 MG capsule Take 10 mg by mouth daily. 02/25/22  Yes [provider]  polyethylene glycol (MIRALAX / GLYCOLAX) 17 g packet Take 17 g by mouth daily.   Yes [provider]  torsemide (DEMADEX) 10 MG tablet Take 10 mg by mouth daily. 10/22/21  Yes [provider]  traZODone (DESYREL) 50 MG tablet Take 1 tablet by mouth at bedtime as needed. 08/22/20  Yes [provider]  acetaminophen (TYLENOL) 325 MG tablet Take 2 tablets by mouth every 8 (eight) hours as needed.    [provider]  diclofenac Sodium (VOLTAREN) 1 % GEL Apply topically. 10/14/21   [provider]  docusate sodium (COLACE) 100 MG capsule Take 100 mg by mouth 2 (two) times daily.    [provider]  fluconazole (DIFLUCAN) 150 MG tablet Take 150 mg by mouth  once. Patient not taking: Reported on 06/12/2022 01/14/22   [provider]  furosemide (LASIX) 40 MG tablet Take 10 mg by mouth daily. Patient not taking: Reported on 06/12/2022 02/19/21   [provider]  ipratropium-albuterol (DUONEB) 0.5-2.5 (3) MG/3ML SOLN Take by nebulization. Patient not taking: Reported on 06/12/2022 05/18/21   [provider]  LAGEVRIO 200 MG CAPS capsule Take by mouth. Patient not taking: Reported on 06/12/2022 01/04/22   [provider]  metroNIDAZOLE (METROGEL) 1 % gel Apply topically. 02/03/22   [provider]  NYSTATIN powder Apply topically. Patient not taking: Reported on 06/12/2022 01/27/22   [provider]   Recent Labs    06/12/22 1936 06/13/22 0803 06/14/22 0257  WBC 16.4* 10.9* 9.8  HGB 9.4* 8.0* 7.9*  HCT 32.0* 26.3* 26.4*  PLT 164 146* 186  K 4.5 4.4 4.9  CL 99 105 99  CO2 22 23 23   BUN 33* 37* 45*  CREATININE 5.46* 5.17* 6.28*  GLUCOSE 276* 281* 122*  CALCIUM 8.4* 7.0* 8.0*   CT EXTREMITY LOWER RIGHT WO CONTRAST  Result Date: 06/13/2022 CLINICAL DATA:  Soft tissue infection suspected. Cellulitis. Evaluate for drainable pocket. EXAM: CT OF THE LOWER RIGHT EXTREMITY WITHOUT CONTRAST TECHNIQUE: Multidetector CT imaging of the right lower extremity was performed according to the standard protocol. RADIATION DOSE REDUCTION: This exam was performed according to the departmental dose-optimization program which includes automated exposure control, adjustment of the mA and/or kV according to patient size and/or use of iterative reconstruction technique. COMPARISON:  None Available. FINDINGS: Bones/Joint/Cartilage No evidence of fracture or dislocation. Multiple lucencies in the distal femur as well as scattered lucencies in the tibia suggesting osteopenia. Plate and screw fixation of the medial aspect of the proximal tibia. Ligaments Suboptimally assessed by CT. Muscles and Tendons Generalized muscle atrophy. No  intramuscular fluid collection or abscess. Soft tissues Marked skin thickening and subcutaneous soft tissue edema. There are multiple punctate foci of gas in the posterior aspect of the mid to distal calf without evidence of drainable fluid collection or abscess. IMPRESSION: 1. Marked skin thickening and subcutaneous soft tissue edema consistent with cellulitis. No evidence of drainable fluid collection or abscess. 2. Multiple punctate foci of gas in the posterior aspect of the mid to distal calf without evidence of drainable fluid collection or abscess. 3. No evidence of fracture or dislocation. 4. Multiple lucencies in the distal femur and tibia suggesting osteopenia. Electronically Signed   By: Keane Police D.O.   On: 06/13/2022 23:02   MR TIBIA FIBULA RIGHT W WO CONTRAST  Result Date: 06/13/2022 CLINICAL DATA:  Lower leg soft tissue infection EXAM: MRI OF LOWER RIGHT EXTREMITY WITHOUT AND WITH CONTRAST TECHNIQUE: Multiplanar, multisequence MR imaging of the right tibia/fib was performed both before and after administration of intravenous contrast. CONTRAST:  15mL GADAVIST GADOBUTROL 1 MMOL/ML IV SOLN COMPARISON:  Radiographs 06/12/2022 FINDINGS: Bones/Joint/Cartilage No findings of osteomyelitis in the tibia or fibula. Proximal tibia obscured by artifact related to the plate and screw fixator. Specifically, the mid fibular region of concern on radiography appears normal. Ligaments N/A Muscles and Tendons Prominent regional muscular atrophy. No significant muscular enhancement indicate myositis. Soft tissues Substantial diffuse subcutaneous edema circumferentially in the calf. There is little in the way of associated enhancement and accordingly the appearance is not considered definitive or specific for cellulitis as opposed to other causes of subcutaneous edema. No drainable abscess. IMPRESSION: 1. Substantial diffuse subcutaneous edema in the calf, with little in the way of associated enhancement and  accordingly the appearance is not considered definitive or specific for cellulitis as opposed to other causes of subcutaneous edema. No drainable abscess or osteomyelitis. 2. Prominent regional muscular atrophy. Electronically Signed   By: Van Clines M.D.   On: 06/13/2022 12:36   DG Tibia/Fibula Right  Result Date: 06/12/2022 CLINICAL DATA:  Wound infection. EXAM: RIGHT TIBIA AND FIBULA - 2 VIEW COMPARISON:  May 12, 2022 FINDINGS: No acute fracture seen. Stable sideplate and screw fixation of the proximal tibia. Mildly sclerotic appearance of the midportion  of the fibula. No cortical erosions seen. Soft tissue edema about the leg. IMPRESSION: 1. No acute fracture or dislocation identified about the right tibia or fibula. 2. Mildly sclerotic appearance of the midportion of the fibula, nonspecific. No cortical erosions seen. Appearance is equivocal for osteomyelitis. 3. Soft tissue edema. Electronically Signed   By: Fidela Salisbury M.D.   On: 06/12/2022 17:59     Positive ROS: All other systems have been reviewed and were otherwise negative with the exception of those mentioned in the HPI and as above.  Physical Exam: BP (!) 107/54 (BP Location: Left Leg) Comment: Justice, LPN made aware  Pulse 84   Temp 99 F (37.2 C) (Oral)   Resp 14   Ht 5\' 4"  (1.626 m)   Wt 135.6 kg   SpO2 96%   BMI 51.32 kg/m  General:  Alert, no acute distress Psychiatric:  Patient is competent for consent with normal mood and affect     Orthopedic Exam:  RLE: + DF/PF/EHL Significant numbness in lower extremity Foot warm Anteromedial proximal tibia incision from prior ORIF tibial plateau is well healed. There is an ~5x5cm ulcer inferior to this wound that appears to have a relatively healthy bed of tissue without signs of purulence or gross infection.   On the anterolateral and lateral aspect of the mid-portion of the leg, there are 2 ulcers with grossly purulent drainage. Anterolateral wound measures  ~2x2cm, lateral wound measures ~1x1cm. On probing, these track ~1-2cm deep, but do not appear to violate anterior compartment muscle fascia. Mild erythema about these wounds. There is significant brawny edema/lymphedema present consistent with chronic venous stasis.    Imaging:  As above: Well healed medial tibial plateau fracture with medially-based hardware. CT and MRI do not show any true abscess pocket. No signs of osteomyelitis.   Assessment/Plan: 72 yo F with ESRD on dialysis, DM, hypercoagulable state with prior CVAs, and ORIF of R medial tibial plateau complicated by wound infection in 2019, now presenting with chronic draining wounds from anterolateral and lateral R leg. It does not appear that the hardware if infected as this appears to be a separate infection. I discussed the findings with the patient and her daughter over the phone. We discussed both further non-surgical management with local wound care and antibiotics. We discussed that there would be a significant chance that she may not get significant improvement with this. We discussed that surgical intervention would likely entail I&D of the R leg with wound VAC application. We also discussed that there was also a chance that this may not allow for complete wound healing, but likely would give her the best chance of healing these wounds. We also discussed that there was a chance of significant complication with surgery given her given her multiple medical co-morbidities. After discussion of risks, benefits, and alternatives to surgery, the patient elected to proceed with surgical intervention. We will plan for this surgery tomorrow, 06/15/22.  Hold home Eliquis in advance of surgery. Can re-start on POD#1. NPO after midnight. Continue IV Abx.   Leim Fabry   06/14/2022 11:20 AM

## 2022-06-14 NOTE — Consult Note (Signed)
Pharmacy Antibiotic Note  Jody Nelson is a 72 y.o. female admitted on 06/12/2022 with cellulitis/abscess.  Patient with PMH of ESRD-HD-MWF, last dialysis session Saturday 2/3 PTA - did not complete. Pharmacy has been consulted for vancomycin dosing.  -2/5: zosyn changed to ertapenem, based on previously colonized with ESBL (ucx) and high abx exposure.   Plan: Vancomycin 2000 mg x 1 given in ED.  Initiate Vancomycin maintenance regimen of 1000 mg Q HD session. Goal Vancomycin level 15-25 mcg/mL Follow up with in patient HD schedule  Height: 5\' 4"  (162.6 cm) Weight: 135.6 kg (299 lb) IBW/kg (Calculated) : 54.7  Temp (24hrs), Avg:98.6 F (37 C), Min:98 F (36.7 C), Max:99 F (37.2 C)  Recent Labs  Lab 06/12/22 1936 06/13/22 0803 06/14/22 0257  WBC 16.4* 10.9* 9.8  CREATININE 5.46* 5.17* 6.28*  LATICACIDVEN 2.2* 1.5  --      Estimated Creatinine Clearance: 11.6 mL/min (A) (by C-G formula based on SCr of 6.28 mg/dL (H)).    Allergies  Allergen Reactions   Sulfamethoxazole-Trimethoprim     Other reaction(s): Kidney Disorder Other reaction(s): Kidney Disorder Hyperkalemia, AKI Hyperkalemia, AKI     Antimicrobials this admission: 2/3 Zosyn >> 2/5 2/3 Vancomycin >>  2/3 Ertapenem >>  Dose adjustments this admission:   Microbiology results: 2/4: Abscess cx:  few GNR-reincubated  Thank you for allowing pharmacy to be a part of this patient's care.  Noralee Space, PharmD Clinical Pharmacist   06/14/2022 11:59 AM

## 2022-06-14 NOTE — Consult Note (Addendum)
[facility] Nurse Consult Note: Reason for Consult:Consult requested for bilat heels and right leg.  Pt states she was followed by the outpatient wound care clinic in the past and the areas have improved.  Left heel with Stage 3 pressure injury; 2X3X.2cm, red and moist Right heel with Stage 2 pressure injury; 1X.3X.1cm, red and moist Right anterior calf with chronic full thickness wound; 4X4X.2cm, dark red and moist Right outer calf with several areas of full thickness wounds; .5X.5X.2cm, 1X1X.2cm, .3X.3X.2cm; all approx 50% red, 50% yellow, mod amt yellow drainage Pressure Injury POA: Yes Dressing procedure/placement/frequency: Float heels to reduce pressure. Topical treatment orders provided for bedside nurses to perform as follows to promote healing: 1. Apply Medihoney to right leg wounds (in several areas) Q day, then cover with foam dressing.  Change foam dressings Q 3 days or PRN soiling.  2. Foam dressings to left and right heel wounds Q day, change Q 3 days or PRN soiling. Please re-consult if further assistance is needed.  Thank-you,  Julien Girt MSN, [place], [place], [place], [place]

## 2022-06-14 NOTE — Progress Notes (Incomplete)
       CROSS COVER NOTE  NAME: Lakedra Washington MRN: [id number] DOB : [birth year] ATTENDING PHYSICIAN: Gwynne Edinger, MD    Date of Service   06/14/2022   HPI/Events of Note   Gas pain  Interventions   Assessment/Plan: Simethicone X X    *** professional thanks      To reach the provider On-Call:   7AM- 7PM see care teams to locate the attending and reach out to them via [email or web address] Password: [email or web address] 7PM-7AM contact night-coverage If you still have difficulty reaching the appropriate provider, please page the [facility] (Director on Call) for [facility] on amion for assistance  This document was prepared using Systems analyst and may include unintentional dictation errors.  Neomia Glass DNP, MBA, FNP-BC, PMHNP-BC Nurse Practitioner [facility] Pager [phone]

## 2022-06-14 NOTE — TOC Initial Note (Signed)
Transition of Care [facility]) - Initial/Assessment Note    Patient Details  Name: Jody Nelson MRN: [id number] Date of Birth: 04/21/1951  Transition of Care [facility]) CM/SW Contact:    Beverly Sessions, RN Phone Number: 06/14/2022, 3:02 PM  Clinical Narrative:                 Patient off the floor for HD Confirmed with Debra at [facility] that patient is LTC        Patient Goals and CMS Choice            Expected Discharge Plan and Services                                              Prior Living Arrangements/Services                       Activities of Daily Living Home Assistive Devices/Equipment: None ADL Screening (condition at time of admission) Patient's cognitive ability adequate to safely complete daily activities?: Yes Is the patient deaf or have difficulty hearing?: Yes Does the patient have difficulty seeing, even when wearing glasses/contacts?: No Does the patient have difficulty concentrating, remembering, or making decisions?: Yes Patient able to express need for assistance with ADLs?: Yes Does the patient have difficulty dressing or bathing?: Yes Independently performs ADLs?: No Communication: Independent Dressing (OT): Needs assistance Is this a change from baseline?: Pre-admission baseline Grooming: Independent with device (comment) Is this a change from baseline?: Pre-admission baseline Feeding: Independent Bathing: Independent Toileting: Needs assistance Is this a change from baseline?: Pre-admission baseline In/Out Bed: Needs assistance Is this a change from baseline?: Pre-admission baseline Walks in Home: Needs assistance Is this a change from baseline?: Pre-admission baseline Does the patient have difficulty walking or climbing stairs?: Yes Weakness of Legs: Both Weakness of Arms/Hands: Right  Permission Sought/Granted                  Emotional Assessment              Admission diagnosis:  Wound infection  [T14.8XXA, L08.9] Osteomyelitis of right leg (HCC) [M86.9] Patient Active Problem List   Diagnosis Date Noted   Wound infection 06/14/2022   Decubitus ulcer of heel, bilateral 06/13/2022   Hypercoagulable state (positive anticardiolipin, elevated homocysteine, factor V Leiden deficiency) 06/13/2022   Osteomyelitis of right leg ([place]) 06/12/2022   Non-healing wound of right lower extremity 06/12/2022   Cellulitis and abscess of right leg 06/12/2022   Chronic anticoagulation 06/12/2022   ESRD on hemodialysis ([place]) 12/27/2021   (HFpEF) heart failure with preserved ejection fraction ([place]) 01/31/2021   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[place]) 10/02/2020   Hereditary and idiopathic neuropathy, unspecified 10/02/2020   History of falling 10/02/2020   Insomnia, unspecified 10/02/2020   Muscle weakness (generalized) 10/02/2020   Resistance to multiple antibiotics 10/02/2020   Unspecified abnormalities of gait and mobility 10/02/2020   Unspecified lack of coordination 10/02/2020   Weakness 10/02/2020   Anemia of chronic renal failure 10/02/2020   Multiple drug resistant organism (MDRO) culture positive 09/25/2020   H/O: CVA (cerebrovascular accident) 06/11/2020   Uncontrolled type 2 diabetes mellitus with hyperglycemia, with long-term current use of insulin ([place]) 06/11/2020   Urinary tract infection 06/11/2020   Heel ulcer, left, limited to breakdown of skin ([place]) 01/04/2020   Leg ulcer, left, limited to breakdown of  skin ([place]) 01/04/2020   Lymphedema, not elsewhere classified 01/04/2020   Chest pain, atypical 09/10/2019   Anemia, unspecified 09/19/2018   Cellulitis of left lower extremity 09/19/2018   Sepsis ([place]) 09/19/2018   Anticardiolipin antibody positive 08/16/2018   Elevated homocysteine 08/16/2018   Heterozygous factor V Leiden mutation ([place]) 08/16/2018   History of obstructive sleep apnea 07/18/2018   Ischemic stroke ([place]) 07/18/2018   Acute kidney failure, unspecified ([place])  07/02/2018   Acute kidney injury superimposed on CKD ([place]) 06/29/2018   Encounter for other specified surgical aftercare 05/11/2018   Proteus (mirabilis) (morganii) as the cause of diseases classified elsewhere 05/11/2018   CKD (chronic kidney disease) stage 3, GFR 30-59 ml/min (HCC) 03/22/2018   Closed fracture of proximal end of left humerus 03/22/2018   Closed fracture of right tibial plateau with routine healing 03/22/2018   Hyperlipidemia with target LDL less than 70 07/14/2017   Morbid (severe) obesity due to excess calories ([place]) 10/20/2016   Unspecified sequelae of cerebral infarction 10/20/2016   Unspecified diastolic (congestive) heart failure ([place]) 10/20/2016   Type 2 diabetes mellitus without complications ([place]) 22/06/5425   Primary osteoarthritis of both knees 05/21/2016   History of influenza 05/04/2016   Dyspnea on effort 03/12/2016   GERD (gastroesophageal reflux disease) 01/27/2016   Calculus of gallbladder without cholecystitis without obstruction 10/21/2015   Pseudophakia of left eye 10/07/2015   Pseudophakia of right eye 08/26/2015   Morbid obesity with BMI of 50.0-59.9, adult ([place]) 07/29/2015   Lymphedema of both lower extremities 07/08/2015   Hallux valgus, acquired 07/08/2015   Onychomycosis due to dermatophyte 07/08/2015   At high risk for falls 12/10/2014   Essential hypertension 12/10/2014   Hypertension associated with type 2 diabetes mellitus ([place]) 12/10/2014   Vitamin D deficiency 11/21/2014   Diabetes mellitus with insulin therapy ([place]) 10/13/2012   Sarcoidosis 01/31/2012   PCP:  Patient, No Pcp Per Pharmacy:   [facility], [address] [place] [address] Phone: [phone] Fax: [phone]     Social Determinants of Health (SDOH) Social History: SDOH Screenings   Food Insecurity: No Food Insecurity (06/12/2022)  Housing: Low Risk  (06/12/2022)  Transportation Needs: No  Transportation Needs (06/12/2022)  Utilities: Not At Risk (06/12/2022)  Tobacco Use: Medium Risk (06/12/2022)   SDOH Interventions:     Readmission Risk Interventions     No data to display

## 2022-06-14 NOTE — Plan of Care (Signed)
  Problem: Education: Goal: Knowledge of General Education information will improve Description: Including pain rating scale, medication(s)/side effects and non-pharmacologic comfort measures Outcome: Progressing   Problem: Clinical Measurements: Goal: Ability to maintain clinical measurements within normal limits will improve Outcome: Progressing   Problem: Clinical Measurements: Goal: Cardiovascular complication will be avoided Outcome: Progressing   Problem: Activity: Goal: Risk for activity intolerance will decrease Outcome: Progressing   Problem: Skin Integrity: Goal: Risk for impaired skin integrity will decrease Outcome: Progressing

## 2022-06-14 NOTE — Progress Notes (Signed)
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  Patient presents to the emergency department with increased swelling and pain from right lower extremity.  She has been admitted for Wound infection [T14.8XXA, L08.9] Osteomyelitis of right leg [facility]) [M86.9]  Patient is known to our practice and receives outpatient dialysis treatments at [facility] on a Monday Wednesday Friday schedule, supervised by Dr. Candiss Norse.    Patient seen sitting up in bed Alert and oriented Reports soreness from left foot wound Tolerating meals with nausea and vomiting Room air Mild lower extremity edema   Objective:  Vital signs in last 24 hours:  Temp:  [98 F (36.7 C)-99 F (37.2 C)] 99 F (37.2 C) (02/05 0730) Pulse Rate:  [82-84] 84 (02/05 0730) Resp:  [14-18] 14 (02/05 0730) BP: (107-143)/(46-59) 107/54 (02/05 0748) SpO2:  [96 %-100 %] 96 % (02/05 0730)  Weight change:  Filed Weights   06/12/22 1710  Weight: 135.6 kg    Intake/Output: I/O last 3 completed shifts: In: 150 [IV Piggyback:150] Out: -    Intake/Output this shift:  Total I/O In: 530 [P.O.:480; IV Piggyback:50] Out: 20 [Urine:20]  Physical Exam: General: NAD  Head: Normocephalic, atraumatic. Moist oral mucosal membranes  Eyes: Anicteric  Lungs:  Clear to auscultation, normal effort, room air  Heart: Regular rate and rhythm  Abdomen:  Soft, nontender, obese  Extremities: Trace to 1+ peripheral edema.  Neurologic: Alert and oriented, moving all four extremities  Skin: No lesions  Access: Left AVG, Rt permcath    Basic Metabolic Panel: Recent Labs  Lab 06/12/22 1936 06/13/22 0803 06/14/22 0257  NA 134* 136 135  K 4.5 4.4 4.9  CL 99 105 99  CO2 22 23 23   GLUCOSE 276*  281* 122*  BUN 33* 37* 45*  CREATININE 5.46* 5.17* 6.28*  CALCIUM 8.4* 7.0* 8.0*  PHOS  --   --  4.6     Liver Function Tests: No results for input(s): "AST", "ALT", "ALKPHOS", "BILITOT", "PROT", "ALBUMIN" in the last 168 hours. No results for input(s): "LIPASE", "AMYLASE" in the last 168 hours. No results for input(s): "AMMONIA" in the last 168 hours.  CBC: Recent Labs  Lab 06/12/22 1936 06/13/22 0803 06/14/22 0257  WBC 16.4* 10.9* 9.8  NEUTROABS 13.0*  --   --   HGB 9.4* 8.0* 7.9*  HCT 32.0* 26.3* 26.4*  MCV 97.0 95.3 94.6  PLT 164 146* 186     Cardiac Enzymes: No results for input(s): "CKTOTAL", "CKMB", "CKMBINDEX", "TROPONINI" in the last 168 hours.  BNP: Invalid input(s): "POCBNP"  CBG: Recent Labs  Lab 06/13/22 1159 06/13/22 1716 06/13/22 2106 06/14/22 0756 06/14/22 1142  GLUCAP 206* 136* 89 89 135*     Microbiology: Results for orders placed or performed during the hospital encounter of 06/12/22  MRSA Next Gen by PCR, Nasal     Status: None   Collection Time: 06/13/22 12:45 AM   Specimen: Nasal Mucosa; Nasal Swab  Result Value Ref Range Status   MRSA by PCR Next Gen NOT DETECTED NOT DETECTED Final    Comment: (NOTE) The GeneXpert MRSA Assay (FDA approved for NASAL specimens only), is one component of a comprehensive MRSA colonization surveillance program. It is not intended to diagnose MRSA infection nor to guide or monitor treatment for MRSA infections. Test  performance is not FDA approved in patients less than 78 years old. Performed at [facility], [address] [place], [place] [address]   Culture, blood (Routine X 2) w Reflex to ID Panel     Status: None (Preliminary result)   Collection Time: 06/13/22  9:34 AM   Specimen: BLOOD  Result Value Ref Range Status   Specimen Description BLOOD BLOOD LEFT HAND  Final   Special Requests   Final    BOTTLES DRAWN AEROBIC AND ANAEROBIC Blood Culture adequate volume   Culture   Final     NO GROWTH < 24 HOURS Performed at [facility], [address], [place], [place] [address]    Report Status PENDING  Incomplete  Aerobic Culture w Gram Stain (superficial specimen)     Status: None (Preliminary result)   Collection Time: 06/13/22  9:35 AM   Specimen: Abscess  Result Value Ref Range Status   Specimen Description   Final    ABSCESS S T Performed at [facility], [address], [place], [place] [address]    Special Requests   Final    NONE Performed at [facility], [address] [place], [place] [address]    Gram Stain   Final    FEW GRAM POSITIVE COCCI IN SINGLES IN PAIRS RARE GRAM VARIABLE ROD NO WBC SEEN    Culture   Final    FEW MORGANELLA MORGANII CULTURE REINCUBATED FOR BETTER GROWTH SUSCEPTIBILITIES TO FOLLOW Performed at [facility], [place] [address], [place], [place] [address]    Report Status PENDING  Incomplete  Culture, blood (Routine X 2) w Reflex to ID Panel     Status: None (Preliminary result)   Collection Time: 06/13/22  2:23 PM   Specimen: BLOOD  Result Value Ref Range Status   Specimen Description BLOOD BLOOD RIGHT FOREARM  Final   Special Requests   Final    BOTTLES DRAWN AEROBIC AND ANAEROBIC Blood Culture adequate volume   Culture   Final    NO GROWTH < 24 HOURS Performed at [facility], [address] [place], [place] [address]    Report Status PENDING  Incomplete    Coagulation Studies: No results for input(s): "LABPROT", "INR" in the last 72 hours.  Urinalysis: No results for input(s): "COLORURINE", "LABSPEC", "PHURINE", "GLUCOSEU", "HGBUR", "BILIRUBINUR", "KETONESUR", "PROTEINUR", "UROBILINOGEN", "NITRITE", "LEUKOCYTESUR" in the last 72 hours.  Invalid input(s): "APPERANCEUR"    Imaging: CT EXTREMITY LOWER RIGHT WO CONTRAST  Result Date: 06/13/2022 CLINICAL DATA:  Soft tissue infection suspected. Cellulitis. Evaluate for drainable pocket. EXAM: CT OF THE LOWER RIGHT  EXTREMITY WITHOUT CONTRAST TECHNIQUE: Multidetector CT imaging of the right lower extremity was performed according to the standard protocol. RADIATION DOSE REDUCTION: This exam was performed according to the departmental dose-optimization program which includes automated exposure control, adjustment of the mA and/or kV according to patient size and/or use of iterative reconstruction technique. COMPARISON:  None Available. FINDINGS: Bones/Joint/Cartilage No evidence of fracture or dislocation. Multiple lucencies in the distal femur as well as scattered lucencies in the tibia suggesting osteopenia. Plate and screw fixation of the medial aspect of the proximal tibia. Ligaments Suboptimally assessed by CT. Muscles and Tendons Generalized muscle atrophy. No intramuscular fluid collection or abscess. Soft tissues Marked skin thickening and subcutaneous soft tissue edema. There are multiple punctate foci of gas in the posterior aspect of the mid to distal calf without evidence of drainable fluid collection or abscess. IMPRESSION: 1. Marked  skin thickening and subcutaneous soft tissue edema consistent with cellulitis. No evidence of drainable fluid collection or abscess. 2. Multiple punctate foci of gas in the posterior aspect of the mid to distal calf without evidence of drainable fluid collection or abscess. 3. No evidence of fracture or dislocation. 4. Multiple lucencies in the distal femur and tibia suggesting osteopenia. Electronically Signed   By: Keane Police D.O.   On: 06/13/2022 23:02   MR TIBIA FIBULA RIGHT W WO CONTRAST  Result Date: 06/13/2022 CLINICAL DATA:  Lower leg soft tissue infection EXAM: MRI OF LOWER RIGHT EXTREMITY WITHOUT AND WITH CONTRAST TECHNIQUE: Multiplanar, multisequence MR imaging of the right tibia/fib was performed both before and after administration of intravenous contrast. CONTRAST:  45mL GADAVIST GADOBUTROL 1 MMOL/ML IV SOLN COMPARISON:  Radiographs 06/12/2022 FINDINGS:  Bones/Joint/Cartilage No findings of osteomyelitis in the tibia or fibula. Proximal tibia obscured by artifact related to the plate and screw fixator. Specifically, the mid fibular region of concern on radiography appears normal. Ligaments N/A Muscles and Tendons Prominent regional muscular atrophy. No significant muscular enhancement indicate myositis. Soft tissues Substantial diffuse subcutaneous edema circumferentially in the calf. There is little in the way of associated enhancement and accordingly the appearance is not considered definitive or specific for cellulitis as opposed to other causes of subcutaneous edema. No drainable abscess. IMPRESSION: 1. Substantial diffuse subcutaneous edema in the calf, with little in the way of associated enhancement and accordingly the appearance is not considered definitive or specific for cellulitis as opposed to other causes of subcutaneous edema. No drainable abscess or osteomyelitis. 2. Prominent regional muscular atrophy. Electronically Signed   By: Van Clines M.D.   On: 06/13/2022 12:36   DG Tibia/Fibula Right  Result Date: 06/12/2022 CLINICAL DATA:  Wound infection. EXAM: RIGHT TIBIA AND FIBULA - 2 VIEW COMPARISON:  May 12, 2022 FINDINGS: No acute fracture seen. Stable sideplate and screw fixation of the proximal tibia. Mildly sclerotic appearance of the midportion of the fibula. No cortical erosions seen. Soft tissue edema about the leg. IMPRESSION: 1. No acute fracture or dislocation identified about the right tibia or fibula. 2. Mildly sclerotic appearance of the midportion of the fibula, nonspecific. No cortical erosions seen. Appearance is equivocal for osteomyelitis. 3. Soft tissue edema. Electronically Signed   By: Fidela Salisbury M.D.   On: 06/12/2022 17:59     Medications:    anticoagulant sodium citrate     ertapenem     [START ON 06/15/2022] ertapenem     vancomycin      apixaban  5 mg Oral Q12H   atorvastatin  80 mg Oral QHS    calcitRIOL  0.25 mcg Oral Daily   carvedilol  25 mg Oral BID   Chlorhexidine Gluconate Cloth  6 each Topical Daily   ferrous sulfate  325 mg Oral Daily   folic acid  1 mg Oral Daily   gabapentin  200 mg Oral BID   insulin aspart  0-20 Units Subcutaneous TID WC   insulin aspart  0-5 Units Subcutaneous QHS   insulin glargine-yfgn  10 Units Subcutaneous QHS   leptospermum manuka honey  1 Application Topical Daily   polyethylene glycol  34 g Oral Daily   torsemide  10 mg Oral Daily   acetaminophen **OR** acetaminophen, albuterol, alteplase, anticoagulant sodium citrate, Glycerin (Adult), heparin, HYDROcodone-acetaminophen, HYDROmorphone (DILAUDID) injection, lidocaine (PF), lidocaine-prilocaine, ondansetron **OR** ondansetron (ZOFRAN) IV, pentafluoroprop-tetrafluoroeth, traZODone  Assessment/ Plan:  Ms. Carena Stream is a 73 y.o.  female with past  medical conditions including anemia, diabetes type 2, 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  Patient presents to the emergency department with increased swelling and pain from right lower extremity.  She has been admitted for Wound infection [T14.8XXA, L08.9] Osteomyelitis of right leg (HCC) [M86.9]   End-stage renal disease on hemodialysis.  Scheduled to receive dialysis later today. UF 0-0.5L as tolerated. Next treatment scheduled for Wednesday.   2. Anemia of chronic kidney disease Lab Results  Component Value Date   HGB 7.9 (L) 06/14/2022    Patient receives Mircera at outpatient clinic. Hgb remains decreased. Monitoring for now.   3. Secondary Hyperparathyroidism: with outpatient labs: PTH 463, phosphorus 6.8, calcium 8.4 on 05/17/22.    Lab Results  Component Value Date   CALCIUM 8.0 (L) 06/14/2022   CAION 1.13 (L) 04/16/2022   PHOS 4.6 06/14/2022    Currently prescribed calcitriol, cholecalciferol, and calcium acetate outpatient.  Bone minerals  acceptable at this time.   4. Diabetes mellitus type II with chronic kidney disease/renal manifestations: insulin dependent. Home regimen includes Lantus.      LOS: 2 [place] 2/5/20242:30 PM

## 2022-06-14 NOTE — Progress Notes (Signed)
   06/14/22 1500  Spiritual Encounters  Type of Visit Initial  Care provided to: Patient  Referral source Nurse (RN/NT/LPN)  Reason for visit Urgent spiritual support  OnCall Visit Yes  Spiritual Framework  Presenting Themes Goals in life/care;Significant life change  Patient Stress Factors Major life changes  Interventions  Spiritual Care Interventions Made Prayer;Compassionate presence;Established relationship of care and support;Reflective listening;Normalization of emotions;Explored ethical dilemma  Spiritual Care Plan  Spiritual Care Issues Still Outstanding No further spiritual care needs at this time (see row info)   Spoke with patient is dealing with a possible life changing surgery removal of her leg do to infection. Prayer with patient for healing and possible surgery in the future. Patient was dealing with the stress of losing her leg.

## 2022-06-14 NOTE — Progress Notes (Signed)
Received patient in bed to unit.  Alert and oriented.  Informed consent signed and in chart.   TX duration: 3.5 hours  Patient tolerated well.  Transported back to the room  Alert, without acute distress.  Hand-off given to patient's nurse.   Access used:  Left AVG Access issues: none  Total UF removed: 500 ml Medication(s) given: Vancomycin IV    [facility]

## 2022-06-15 ENCOUNTER — Encounter
Admission: EM | Disposition: A | Discharge status: Skilled Nursing Facility | Payer: Self-pay | Source: Skilled Nursing Facility | Attending: Internal Medicine

## 2022-06-15 ENCOUNTER — Other Ambulatory Visit: Payer: Self-pay

## 2022-06-15 ENCOUNTER — Encounter: Payer: Self-pay | Admitting: Internal Medicine

## 2022-06-15 ENCOUNTER — Inpatient Hospital Stay: Payer: 59 | Admitting: Certified Registered"

## 2022-06-15 DIAGNOSIS — L02415 Cutaneous abscess of right lower limb: Secondary | ICD-10-CM | POA: Diagnosis not present

## 2022-06-15 DIAGNOSIS — L03115 Cellulitis of right lower limb: Secondary | ICD-10-CM | POA: Diagnosis not present

## 2022-06-15 HISTORY — PX: APPLICATION OF WOUND VAC: SHX5189

## 2022-06-15 HISTORY — PX: INCISION AND DRAINAGE OF WOUND: SHX1803

## 2022-06-15 LAB — CBC
HCT: 27 % — ABNORMAL LOW (ref 36.0–46.0)
Hemoglobin: 8.1 g/dL — ABNORMAL LOW (ref 12.0–15.0)
MCH: 27.9 pg (ref 26.0–34.0)
MCHC: 30 g/dL (ref 30.0–36.0)
MCV: 93.1 fL (ref 80.0–100.0)
Platelets: 214 10*3/uL (ref 150–400)
RBC: 2.9 MIL/uL — ABNORMAL LOW (ref 3.87–5.11)
RDW: 15.6 % — ABNORMAL HIGH (ref 11.5–15.5)
WBC: 7.9 10*3/uL (ref 4.0–10.5)
nRBC: 0 % (ref 0.0–0.2)

## 2022-06-15 LAB — BASIC METABOLIC PANEL
Anion gap: 9 (ref 5–15)
BUN: 27 mg/dL — ABNORMAL HIGH (ref 8–23)
CO2: 28 mmol/L (ref 22–32)
Calcium: 7.6 mg/dL — ABNORMAL LOW (ref 8.9–10.3)
Chloride: 98 mmol/L (ref 98–111)
Creatinine, Ser: 3.96 mg/dL — ABNORMAL HIGH (ref 0.44–1.00)
GFR, Estimated: 12 mL/min — ABNORMAL LOW (ref >60)
Glucose, Bld: 152 mg/dL — ABNORMAL HIGH (ref 70–99)
Potassium: 3.8 mmol/L (ref 3.5–5.1)
Sodium: 135 mmol/L (ref 135–145)

## 2022-06-15 LAB — GLUCOSE, CAPILLARY
Glucose-Capillary: 132 mg/dL — ABNORMAL HIGH (ref 70–99)
Glucose-Capillary: 98 mg/dL (ref 70–99)
Glucose-Capillary: 82 mg/dL (ref 70–99)
Glucose-Capillary: 87 mg/dL (ref 70–99)
Glucose-Capillary: 147 mg/dL — ABNORMAL HIGH (ref 70–99)
Glucose-Capillary: 172 mg/dL — ABNORMAL HIGH (ref 70–99)

## 2022-06-15 LAB — HEPATITIS B SURFACE ANTIBODY, QUANTITATIVE: Hep B S AB Quant (Post): <3.1 m[IU]/mL — ABNORMAL LOW (ref >9.9)

## 2022-06-15 SURGERY — IRRIGATION AND DEBRIDEMENT WOUND
Anesthesia: General | Laterality: Right

## 2022-06-15 MED ORDER — SODIUM CHLORIDE 0.9 % IV SOLN
1.0000 g | Freq: Once | INTRAVENOUS | Status: AC
  Administered 2022-06-15: 1000 mg via INTRAVENOUS
  Filled 2022-06-15: qty 1

## 2022-06-15 MED ORDER — DEXAMETHASONE SODIUM PHOSPHATE 10 MG/ML IJ SOLN
INTRAMUSCULAR | Status: DC | PRN
  Administered 2022-06-15: 5 mg via INTRAVENOUS

## 2022-06-15 MED ORDER — PHENYLEPHRINE HCL (PRESSORS) 10 MG/ML IV SOLN
INTRAVENOUS | Status: DC | PRN
  Administered 2022-06-15 (×2): 80 ug via INTRAVENOUS
  Administered 2022-06-15 (×2): 160 ug via INTRAVENOUS
  Administered 2022-06-15: 80 ug via INTRAVENOUS

## 2022-06-15 MED ORDER — FENTANYL CITRATE (PF) 100 MCG/2ML IJ SOLN
INTRAMUSCULAR | Status: DC | PRN
  Administered 2022-06-15 (×2): 50 ug via INTRAVENOUS

## 2022-06-15 MED ORDER — KETAMINE HCL 50 MG/5ML IJ SOSY
PREFILLED_SYRINGE | INTRAMUSCULAR | Status: AC
  Filled 2022-06-15: qty 5

## 2022-06-15 MED ORDER — ALBUMIN HUMAN 5 % IV SOLN
INTRAVENOUS | Status: AC
  Filled 2022-06-15: qty 250

## 2022-06-15 MED ORDER — PHENYLEPHRINE 80 MCG/ML (10ML) SYRINGE FOR IV PUSH (FOR BLOOD PRESSURE SUPPORT)
PREFILLED_SYRINGE | INTRAVENOUS | Status: AC
  Filled 2022-06-15: qty 10

## 2022-06-15 MED ORDER — SUCCINYLCHOLINE CHLORIDE 200 MG/10ML IV SOSY
PREFILLED_SYRINGE | INTRAVENOUS | Status: AC
  Filled 2022-06-15: qty 10

## 2022-06-15 MED ORDER — LIDOCAINE HCL (CARDIAC) PF 100 MG/5ML IV SOSY
PREFILLED_SYRINGE | INTRAVENOUS | Status: DC | PRN
  Administered 2022-06-15: 40 mg via INTRAVENOUS

## 2022-06-15 MED ORDER — FENTANYL CITRATE (PF) 100 MCG/2ML IJ SOLN
25.0000 ug | INTRAMUSCULAR | Status: DC | PRN
  Administered 2022-06-15 (×2): 25 ug via INTRAVENOUS

## 2022-06-15 MED ORDER — KETAMINE HCL 10 MG/ML IJ SOLN
INTRAMUSCULAR | Status: DC | PRN
  Administered 2022-06-15: 20 mg via INTRAVENOUS
  Administered 2022-06-15: 10 mg via INTRAVENOUS

## 2022-06-15 MED ORDER — VASOPRESSIN 20 UNIT/ML IV SOLN
INTRAVENOUS | Status: DC | PRN
  Administered 2022-06-15 (×2): 1 [IU] via INTRAVENOUS

## 2022-06-15 MED ORDER — PROPOFOL 10 MG/ML IV BOLUS
INTRAVENOUS | Status: DC | PRN
  Administered 2022-06-15: 80 mg via INTRAVENOUS

## 2022-06-15 MED ORDER — SODIUM CHLORIDE 0.9 % IV SOLN
500.0000 mg | INTRAVENOUS | Status: DC
  Filled 2022-06-15: qty 0.5

## 2022-06-15 MED ORDER — FENTANYL CITRATE (PF) 100 MCG/2ML IJ SOLN
INTRAMUSCULAR | Status: AC
  Administered 2022-06-15: 25 ug via INTRAVENOUS
  Filled 2022-06-15: qty 2

## 2022-06-15 MED ORDER — 0.9 % SODIUM CHLORIDE (POUR BTL) OPTIME
TOPICAL | Status: DC | PRN
  Administered 2022-06-15: 1000 mL

## 2022-06-15 MED ORDER — LIDOCAINE HCL (PF) 2 % IJ SOLN
INTRAMUSCULAR | Status: AC
  Filled 2022-06-15: qty 5

## 2022-06-15 MED ORDER — FENTANYL CITRATE (PF) 100 MCG/2ML IJ SOLN
INTRAMUSCULAR | Status: AC
  Filled 2022-06-15: qty 2

## 2022-06-15 MED ORDER — EPHEDRINE SULFATE (PRESSORS) 50 MG/ML IJ SOLN
INTRAMUSCULAR | Status: DC | PRN
  Administered 2022-06-15 (×2): 10 mg via INTRAVENOUS

## 2022-06-15 MED ORDER — SODIUM CHLORIDE 0.9 % IV SOLN: INTRAVENOUS | Status: DC | PRN

## 2022-06-15 MED ORDER — VASOPRESSIN 20 UNIT/ML IV SOLN
INTRAVENOUS | Status: AC
  Filled 2022-06-15: qty 1

## 2022-06-15 MED ORDER — ALBUMIN HUMAN 5 % IV SOLN: INTRAVENOUS | Status: DC | PRN

## 2022-06-15 MED ORDER — APIXABAN 5 MG PO TABS
5.0000 mg | ORAL_TABLET | Freq: Two times a day (BID) | ORAL | Status: DC
  Administered 2022-06-16 – 2022-06-20 (×9): 5 mg via ORAL
  Filled 2022-06-15 (×9): qty 1

## 2022-06-15 MED ORDER — ACETAMINOPHEN 10 MG/ML IV SOLN
INTRAVENOUS | Status: AC
  Filled 2022-06-15: qty 100

## 2022-06-15 MED ORDER — MIDAZOLAM HCL 2 MG/2ML IJ SOLN
INTRAMUSCULAR | Status: AC
  Filled 2022-06-15: qty 2

## 2022-06-15 MED ORDER — GLYCOPYRROLATE 0.2 MG/ML IJ SOLN
INTRAMUSCULAR | Status: DC | PRN
  Administered 2022-06-15: .1 mg via INTRAVENOUS

## 2022-06-15 MED ORDER — SODIUM CHLORIDE (PF) 0.9 % IJ SOLN
INTRAMUSCULAR | Status: AC
  Filled 2022-06-15: qty 20

## 2022-06-15 MED ORDER — DEXAMETHASONE SODIUM PHOSPHATE 10 MG/ML IJ SOLN
INTRAMUSCULAR | Status: AC
  Filled 2022-06-15: qty 1

## 2022-06-15 MED ORDER — GLYCOPYRROLATE 0.2 MG/ML IJ SOLN
INTRAMUSCULAR | Status: AC
  Filled 2022-06-15: qty 1

## 2022-06-15 MED ORDER — ONDANSETRON HCL 4 MG/2ML IJ SOLN
INTRAMUSCULAR | Status: AC
  Filled 2022-06-15: qty 2

## 2022-06-15 MED ORDER — ONDANSETRON HCL 4 MG/2ML IJ SOLN
INTRAMUSCULAR | Status: DC | PRN
  Administered 2022-06-15: 4 mg via INTRAVENOUS

## 2022-06-15 MED ORDER — EPHEDRINE 5 MG/ML INJ
INTRAVENOUS | Status: AC
  Filled 2022-06-15: qty 5

## 2022-06-15 MED ORDER — ONDANSETRON HCL 4 MG/2ML IJ SOLN: 4.0000 mg | Freq: Once | INTRAMUSCULAR | Status: DC | PRN

## 2022-06-15 MED ORDER — ACETAMINOPHEN 10 MG/ML IV SOLN
INTRAVENOUS | Status: DC | PRN
  Administered 2022-06-15: 1000 mg via INTRAVENOUS

## 2022-06-15 MED ORDER — MIDAZOLAM HCL 2 MG/2ML IJ SOLN
INTRAMUSCULAR | Status: DC | PRN
  Administered 2022-06-15: 2 mg via INTRAVENOUS

## 2022-06-15 SURGICAL SUPPLY — 56 items
CANISTER WOUND CARE 500ML ATS (WOUND CARE) IMPLANT
CONNECTOR Y WND VAC (MISCELLANEOUS) IMPLANT
COOLER POLAR GLACIER W/PUMP (MISCELLANEOUS) ×1 IMPLANT
CUFF TOURN SGL QUICK 24 (TOURNIQUET CUFF)
CUFF TOURN SGL QUICK 34 (TOURNIQUET CUFF)
CUFF TRNQT CYL 24X4X16.5-23 (TOURNIQUET CUFF) IMPLANT
CUFF TRNQT CYL 34X4.125X (TOURNIQUET CUFF) IMPLANT
DRSG MEPILEX FLEX 3X3 (GAUZE/BANDAGES/DRESSINGS) IMPLANT
DRSG MEPILEX HEEL 8.7X9.1 (GAUZE/BANDAGES/DRESSINGS) IMPLANT
DRSG NON-ADHERENT DERMACEA 3X4 (GAUZE/BANDAGES/DRESSINGS) ×1 IMPLANT
DRSG OPSITE POSTOP 4X14 (GAUZE/BANDAGES/DRESSINGS) ×1 IMPLANT
DRSG VAC ATS MED SENSATRAC (GAUZE/BANDAGES/DRESSINGS) IMPLANT
DRSG VAC GRANUFOAM LG (GAUZE/BANDAGES/DRESSINGS) IMPLANT
DURAPREP 26ML APPLICATOR (WOUND CARE) ×2 IMPLANT
ELECT REM PT RETURN 9FT ADLT (ELECTROSURGICAL) ×1
ELECTRODE REM PT RTRN 9FT ADLT (ELECTROSURGICAL) ×1 IMPLANT
GLOVE BIOGEL M STRL SZ7.5 (GLOVE) ×2 IMPLANT
GLOVE SRG 8 PF TXTR STRL LF DI (GLOVE) ×1 IMPLANT
GLOVE SURG UNDER POLY LF SZ8 (GLOVE) ×1
GOWN STRL REUS W/ TWL LRG LVL3 (GOWN DISPOSABLE) ×2 IMPLANT
GOWN STRL REUS W/TWL LRG LVL3 (GOWN DISPOSABLE) ×2
HOLDER FOLEY CATH W/STRAP (MISCELLANEOUS) ×1 IMPLANT
IV NS IRRIG 3000ML ARTHROMATIC (IV SOLUTION) ×1 IMPLANT
KIT TURNOVER KIT A (KITS) ×1 IMPLANT
MANIFOLD NEPTUNE II (INSTRUMENTS) ×2 IMPLANT
NDL SAFETY ECLIP 18X1.5 (MISCELLANEOUS) ×1 IMPLANT
NDL SPNL 20GX3.5 QUINCKE YW (NEEDLE) ×1 IMPLANT
NEEDLE SPNL 20GX3.5 QUINCKE YW (NEEDLE) IMPLANT
NS IRRIG 500ML POUR BTL (IV SOLUTION) ×1 IMPLANT
PACK EXTREMITY ARMC (MISCELLANEOUS) IMPLANT
PACK TOTAL KNEE (MISCELLANEOUS) ×1 IMPLANT
PAD WRAPON POLAR KNEE (MISCELLANEOUS) ×1 IMPLANT
PENCIL SMOKE EVACUATOR (MISCELLANEOUS) ×1 IMPLANT
PIN FIXATION 1/8DIA X 3INL (PIN) ×3 IMPLANT
PULSAVAC PLUS IRRIG FAN TIP (DISPOSABLE)
SOL PREP PVP 2OZ (MISCELLANEOUS) ×1
SOLUTION PREP PVP 2OZ (MISCELLANEOUS) ×1 IMPLANT
SPONGE DRAIN TRACH 4X4 STRL 2S (GAUZE/BANDAGES/DRESSINGS) ×1 IMPLANT
STAPLER SKIN PROX 35W (STAPLE) ×1 IMPLANT
SUCTION FRAZIER HANDLE 10FR (MISCELLANEOUS)
SUCTION TUBE FRAZIER 10FR DISP (MISCELLANEOUS) ×1 IMPLANT
SUT MNCRL AB 4-0 PS2 18 (SUTURE) ×1 IMPLANT
SUT MON AB 2-0 CT1 36 (SUTURE) ×1 IMPLANT
SUT PROLENE 1 CT 1 30 (SUTURE) ×1 IMPLANT
SUT VIC AB 0 CT1 36 (SUTURE) ×1 IMPLANT
SUT VIC AB 1 CT1 36 (SUTURE) ×2 IMPLANT
SUT VIC AB 2-0 CT2 27 (SUTURE) ×1 IMPLANT
SYR 20ML LL LF (SYRINGE) ×1 IMPLANT
SYR 30ML LL (SYRINGE) ×1 IMPLANT
SYR 50ML LL SCALE MARK (SYRINGE) ×1 IMPLANT
TIP FAN IRRIG PULSAVAC PLUS (DISPOSABLE) ×1 IMPLANT
TOWEL OR 17X26 4PK STRL BLUE (TOWEL DISPOSABLE) ×1 IMPLANT
TRAP FLUID SMOKE EVACUATOR (MISCELLANEOUS) ×1 IMPLANT
TRAY FOLEY MTR SLVR 16FR STAT (SET/KITS/TRAYS/PACK) ×1 IMPLANT
WATER STERILE IRR 500ML POUR (IV SOLUTION) ×1 IMPLANT
WRAPON POLAR PAD KNEE (MISCELLANEOUS)

## 2022-06-15 NOTE — Anesthesia Procedure Notes (Signed)
Procedure Name: LMA Insertion Date/Time: 06/15/2022 2:18 PM  Performed by: Cammie Sickle, CRNAPre-anesthesia Checklist: Patient identified, Patient being monitored, Timeout performed, Emergency Drugs available and Suction available Patient Re-evaluated:Patient Re-evaluated prior to induction Oxygen Delivery Method: Circle system utilized Preoxygenation: Pre-oxygenation with 100% oxygen Induction Type: IV induction Ventilation: Mask ventilation without difficulty LMA: LMA inserted LMA Size: 5.0 Tube type: Oral Number of attempts: 1 Placement Confirmation: positive ETCO2 and breath sounds checked- equal and bilateral Tube secured with: Tape Dental Injury: Teeth and Oropharynx as per pre-operative assessment  Comments: Atraumatic LMA placement with excellent seal, no air leak. No complications noted

## 2022-06-15 NOTE — Anesthesia Preprocedure Evaluation (Signed)
Anesthesia Evaluation  Patient identified by MRN, date of birth, ID band Patient awake    Reviewed: Allergy & Precautions, H&P , NPO status , Patient's Chart, lab work & pertinent test results, reviewed documented beta blocker date and time   Airway Mallampati: II  TM Distance: >3 FB Neck ROM: full    Dental  (+) Teeth Intact   Pulmonary sleep apnea and Continuous Positive Airway Pressure Ventilation , pneumonia, resolved, former smoker   Pulmonary exam normal        Cardiovascular Exercise Tolerance: Poor hypertension, On Medications +CHF and + DOE  Normal cardiovascular exam Rate:Normal     Neuro/Psych  PSYCHIATRIC DISORDERS  Depression     Neuromuscular disease CVA    GI/Hepatic Neg liver ROS,GERD  Medicated,,  Endo/Other  negative endocrine ROSdiabetes    Renal/GU ESRFRenal disease  negative genitourinary   Musculoskeletal   Abdominal   Peds  Hematology  (+) Blood dyscrasia, anemia   Anesthesia Other Findings   Reproductive/Obstetrics negative OB ROS                             Anesthesia Physical Anesthesia Plan  ASA: 4  Anesthesia Plan: General LMA   Post-op Pain Management:    Induction:   PONV Risk Score and Plan: 4 or greater  Airway Management Planned:   Additional Equipment:   Intra-op Plan:   Post-operative Plan:   Informed Consent: I have reviewed the patients History and Physical, chart, labs and discussed the procedure including the risks, benefits and alternatives for the proposed anesthesia with the patient or authorized representative who has indicated his/her understanding and acceptance.       Plan Discussed with: CRNA  Anesthesia Plan Comments:        Anesthesia Quick Evaluation

## 2022-06-15 NOTE — H&P (Signed)
H&P reviewed. No significant changes noted.  

## 2022-06-15 NOTE — Discharge Instructions (Signed)
.  dcinst

## 2022-06-15 NOTE — Consult Note (Signed)
Pharmacy Antibiotic Note  Jody Nelson is a 72 y.o. female admitted on 06/12/2022 with cellulitis and nonhealing wound of R leg. 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 Last HD session PTA on Saturday 2/3 (did not complete). No evidence of OM on imaging. 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 She completed 6 weeks fo vancomycin and ceftriaxone IV plus PO metronidazole followed by PO doxycyline and cefadroxil. 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 For this reason, Zosyn was changed to ertapenem on 2/5. Ertapenem not given 2/5, so it was re-scheduled to start 2/6. Pharmacy has been consulted for vancomycin dosing.  Plan: Day 3 of antibiotics Continue vancomycin 1000 mg IV QHD (MWF). Goal trough 15-25 mcg/mL Plan to check vancomycin level prior to 4th maintenance dose (anticipate 06/18/22)  Patient is also on ertapenem 1000 mg IV x1 then 500 mg IV Q24H thereafter Continue to monitor HD schedule and follow final culture results   Height: 5\' 4"  (162.6 cm) Weight: (!) 139.9 kg (308 lb 6.8 oz) IBW/kg (Calculated) : 54.7  Temp (24hrs), Avg:99.3 F (37.4 C), Min:98.5 F (36.9 C), Max:100.8 F (38.2 C)  Recent Labs  Lab 06/12/22 1936 06/13/22 0803 06/14/22 0257 06/15/22 0302  WBC 16.4* 10.9* 9.8 7.9  CREATININE 5.46* 5.17* 6.28* 3.96*  LATICACIDVEN 2.2* 1.5  --   --      Estimated Creatinine Clearance: 18.8 mL/min (A) (by C-G formula based on SCr of 3.96 mg/dL (H)).    Allergies  Allergen Reactions   Sulfamethoxazole-Trimethoprim     Other reaction(s): Kidney Disorder Other reaction(s): Kidney Disorder Hyperkalemia, AKI Hyperkalemia, AKI     Antimicrobials this admission: 2/3 Zosyn >> 2/5 2/3 Vancomycin >>  2/4 Ertapenem >>  Dose adjustments this admission: N/A  Microbiology results: 2/4  BCx: NG2D 2/4 MRSA PCR: negative 2/4 Abscess cx: few Morganella morganii (R- ampicillin, cefazolin, ciprofloxacin, TMP/SMX)  Thank you for allowing pharmacy to be a part of this patient's care.  Gretel Acre, PharmD PGY1 Pharmacy Resident 06/15/2022 8:38 AM

## 2022-06-15 NOTE — Plan of Care (Signed)
  Problem: Health Behavior/Discharge Planning: Goal: Ability to manage health-related needs will improve Outcome: Progressing   Problem: Clinical Measurements: Goal: Diagnostic test results will improve Outcome: Progressing Goal: Cardiovascular complication will be avoided Outcome: Progressing   Problem: Nutrition: Goal: Adequate nutrition will be maintained Outcome: Progressing   Problem: Elimination: Goal: Will not experience complications related to bowel motility Outcome: Progressing   Problem: Pain Managment: Goal: General experience of comfort will improve Outcome: Progressing

## 2022-06-15 NOTE — Op Note (Addendum)
Operative Note    SURGERY DATE: 06/15/2022   PRE-OP DIAGNOSIS:  1. Right leg chronic wound infection   POST-OP DIAGNOSIS:  1. Right leg chronic wound infection   PROCEDURES:  1. Right leg incision and drainage 2. Irrigation and debridement with depth of muscle/fascia 3. Right leg wound VAC application   SURGEON: Cato Mulligan, MD  ASSISTANTS: Linward Natal, PA-S    ANESTHESIA: Gen    ESTIMATED BLOOD LOSS: 50cc   TOTAL IV FLUIDS: per anesthesia  DRAIN: none   INDICATION(S):  Jody Nelson is a 72 y.o. female who has had chronic purulent draining wounds on the right leg.  Of note, she has had a prior ORIF of the right medial tibial plateau in 2019 that became infected and she underwent prolonged VAC placement that allowed for eventual healing of the wound.  Current wounds are located distal and lateral to the prior region of involvement.  She has not had any surgery on this region.  She has been treated with local wound care at her skilled nursing facility.   OPERATIVE REPORT:   I identified Skylan Gift in the pre-operative holding area. Informed consent was obtained and the surgical site was marked. I reviewed the risks and benefits of the proposed surgical intervention and the patient wished to proceed. The patient was transferred to the operative suite and general anesthesia was administered. The patient was placed in the supine position. All down side pressure points were appropriately padded. The extremity was then prepped and draped in standard fashion. A time out was performed confirming the correct extremity, correct patient, and correct procedure. The patient had already been on antibiotics since admission last night.  There were multiple wounds on the right leg.  The first wound was approximately 5 x 5 cm located at the inferior aspect of the prior surgical incision.  This wound was superficial and there was no purulent drainage so it was not addressed further.  A second wound  measuring approximately 1 x 1 cm was located along the anterolateral aspect of the mid-leg.  There was no purulent drainage from this incision, but it did track approximately 2 cm deep with some devitalized appearing tissue around the skin edges and dermal layer.  A 15 blade and curette were used to freshen the edges until there was healthy bleeding tissue.  Of note, this was a contained wound without tracking proximally to the prior hardware or laterally to the below described wounds.  There was a third wound measuring approximately 2 cm x 1 cm approximately 1 cm lateral to the above described second wound.  There was gross purulence draining from this incision.  Additionally, there was a fourth wound measuring approximately 1 x 1 cm approximately 8 cm lateral to the third wound.  There was profound purulent drainage from this wound as well.  A culture sample was obtained from this wound.  The third and fourth wounds were connected underneath the skin bridge.  Therefore an incision was made over the skin to connect these 2 wounds to allow for improved healing.  A rongeur, curette, and 15 blade were used to perform an excisional debridement of this new connected wound that measured approximately 10 x 3 cm.  Of note, the depth of this wound was to the anterior compartment fascia. Devitalized fascia was removed, but there was no significant involvement deep to the muscle fascia.  Additionally, this wound did track to the posterior aspect of the leg.  Of note, on debriding  this region, a piece of iodoform gauze measuring approximately 8 cm was excised from the wound.  Appropriate excisional debridement was performed.  All wounds were then thoroughly irrigated.    Silver VAC sponge was placed within the larger connected wound in the dead space as well as in the visible incision.  Another small silver sponge was placed in the above described second wound measuring 1 x 1 cm.  These were then connected to a VAC device  and placed to continuous suction.  There was minimal leak with an appropriate seal.  Patient was extubated, transferred to a stretcher bed and to the post antesthesia care unit in stable condition.   POSTOPERATIVE PLAN: Plan for continued VAC changes as needed.  Recommend wound care consult.  Continue IV antibiotics.  No further plan for surgery at this time.

## 2022-06-15 NOTE — Progress Notes (Addendum)
At bedside for PIV insertion. Pt with very poor vasculature. Stuck x2. Able to cannulate vessel, however, due to arm habitus, catheter pops out of the vessel. Third attempt successful. Secured with a stat lock to ensure catheter within vessel lumen. LPN aware and will monitor for s/s infiltration, pt. c/o pain/discomfort. MD also made aware.

## 2022-06-15 NOTE — Discharge Planning (Signed)
[place]  [address] [place] [address] [phone]  Scheduled Days Monday Wednesday and Friday  Treatment Time: 11:00am  Confirmed above schedule with Elta Guadeloupe RN, patient is LTC at [facility]

## 2022-06-15 NOTE — Progress Notes (Signed)
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  Patient presents to the emergency department with increased swelling and pain from right lower extremity.  She has been admitted for Wound infection [T14.8XXA, L08.9] Osteomyelitis of right leg [facility]) [M86.9]  Patient is known to our practice and receives outpatient dialysis treatments at [facility] on a Monday Wednesday Friday schedule, supervised by Dr. Candiss Norse.    Patient seen resting quietly in bed No family at bedside NPO for now No complaints at this time   Objective:  Vital signs in last 24 hours:  Temp:  [98.5 F (36.9 C)-100.8 F (38.2 C)] 99 F (37.2 C) (02/06 0802) Pulse Rate:  [76-96] 77 (02/06 0802) Resp:  [15-29] 15 (02/06 0802) BP: (97-160)/(37-145) 123/46 (02/06 0802) SpO2:  [98 %-100 %] 100 % (02/06 0802) Weight:  [139.9 kg] 139.9 kg (02/05 1845)  Weight change:  Filed Weights   06/12/22 1710 06/14/22 1845  Weight: 135.6 kg (!) 139.9 kg    Intake/Output: I/O last 3 completed shifts: In: 766.7 [P.O.:480; IV Piggyback:286.7] Out: 520 [Urine:20; Other:500]   Intake/Output this shift:  No intake/output data recorded.  Physical Exam: General: NAD  Head: Normocephalic, atraumatic. Moist oral mucosal membranes  Eyes: Anicteric  Lungs:  Clear to auscultation, normal effort, room air  Heart: Regular rate and rhythm  Abdomen:  Soft, nontender, obese  Extremities: Trace to 1+ peripheral edema.  Neurologic: Alert and oriented, moving all four extremities  Skin: No lesions  Access: Left AVG, Rt permcath    Basic Metabolic Panel: Recent Labs  Lab 06/12/22 1936 06/13/22 0803 06/14/22 0257 06/15/22 0302  NA 134* 136 135 135  K 4.5 4.4 4.9 3.8  CL 99 105 99  98  CO2 22 23 23 28   GLUCOSE 276* 281* 122* 152*  BUN 33* 37* 45* 27*  CREATININE 5.46* 5.17* 6.28* 3.96*  CALCIUM 8.4* 7.0* 8.0* 7.6*  PHOS  --   --  4.6  --      Liver Function Tests: No results for input(s): "AST", "ALT", "ALKPHOS", "BILITOT", "PROT", "ALBUMIN" in the last 168 hours. No results for input(s): "LIPASE", "AMYLASE" in the last 168 hours. No results for input(s): "AMMONIA" in the last 168 hours.  CBC: Recent Labs  Lab 06/12/22 1936 06/13/22 0803 06/14/22 0257 06/15/22 0302  WBC 16.4* 10.9* 9.8 7.9  NEUTROABS 13.0*  --   --   --   HGB 9.4* 8.0* 7.9* 8.1*  HCT 32.0* 26.3* 26.4* 27.0*  MCV 97.0 95.3 94.6 93.1  PLT 164 146* 186 214     Cardiac Enzymes: No results for input(s): "CKTOTAL", "CKMB", "CKMBINDEX", "TROPONINI" in the last 168 hours.  BNP: Invalid input(s): "POCBNP"  CBG: Recent Labs  Lab 06/14/22 0756 06/14/22 1142 06/14/22 2034 06/15/22 0804 06/15/22 1150  GLUCAP 89 135* 124* 132* 26     Microbiology: Results for orders placed or performed during the hospital encounter of 06/12/22  MRSA Next Gen by PCR, Nasal     Status: None   Collection Time: 06/13/22 12:45 AM   Specimen: Nasal Mucosa; Nasal Swab  Result Value Ref Range Status   MRSA by PCR Next Gen NOT DETECTED NOT DETECTED Final    Comment: (NOTE) The GeneXpert MRSA Assay (FDA approved for NASAL specimens only), is one component of a  comprehensive MRSA colonization surveillance program. It is not intended to diagnose MRSA infection nor to guide or monitor treatment for MRSA infections. Test performance is not FDA approved in patients less than 63 years old. Performed at [facility], [address] [place], [place] [address]   Culture, blood (Routine X 2) w Reflex to ID Panel     Status: None (Preliminary result)   Collection Time: 06/13/22  9:34 AM   Specimen: BLOOD  Result Value Ref Range Status   Specimen Description BLOOD BLOOD LEFT HAND  Final   Special  Requests   Final    BOTTLES DRAWN AEROBIC AND ANAEROBIC Blood Culture adequate volume   Culture   Final    NO GROWTH 2 DAYS Performed at [facility], [address], [place], [place] [address]    Report Status PENDING  Incomplete  Aerobic Culture w Gram Stain (superficial specimen)     Status: None (Preliminary result)   Collection Time: 06/13/22  9:35 AM   Specimen: Abscess  Result Value Ref Range Status   Specimen Description   Final    ABSCESS S T Performed at [facility], [address], [place], [place] [address]    Special Requests   Final    NONE Performed at [facility], [address], [place], [place] [address]    Gram Stain   Final    FEW GRAM POSITIVE COCCI IN SINGLES IN PAIRS RARE GRAM VARIABLE ROD NO WBC SEEN    Culture   Final    FEW MORGANELLA MORGANII FEW GRAM NEGATIVE RODS FEW STAPHYLOCOCCUS AUREUS SUSCEPTIBILITIES TO FOLLOW Performed at [facility], [place] [address], [place], [place] [address]    Report Status PENDING  Incomplete   Organism ID, Bacteria MORGANELLA MORGANII  Final      Susceptibility   Morganella morganii - MIC*    AMPICILLIN >=32 RESISTANT Resistant     CEFAZOLIN RESISTANT Resistant     CEFTAZIDIME <=1 SENSITIVE Sensitive     CIPROFLOXACIN >=4 RESISTANT Resistant     GENTAMICIN <=1 SENSITIVE Sensitive     IMIPENEM 0.5 SENSITIVE Sensitive     TRIMETH/SULFA >=320 RESISTANT Resistant     AMPICILLIN/SULBACTAM 16 INTERMEDIATE Intermediate     PIP/TAZO <=4 SENSITIVE Sensitive     * FEW MORGANELLA MORGANII  Culture, blood (Routine X 2) w Reflex to ID Panel     Status: None (Preliminary result)   Collection Time: 06/13/22  2:23 PM   Specimen: BLOOD  Result Value Ref Range Status   Specimen Description BLOOD BLOOD RIGHT FOREARM  Final   Special Requests   Final    BOTTLES DRAWN AEROBIC AND ANAEROBIC Blood Culture adequate volume   Culture   Final    NO GROWTH 2 DAYS Performed at [facility], [address] [place], [place] [address]    Report Status PENDING  Incomplete    Coagulation Studies: No results for input(s): "LABPROT", "INR" in the last 72 hours.  Urinalysis: No results for input(s): "COLORURINE", "LABSPEC", "PHURINE", "GLUCOSEU", "HGBUR", "BILIRUBINUR", "KETONESUR", "PROTEINUR", "UROBILINOGEN", "NITRITE", "LEUKOCYTESUR" in the last 72 hours.  Invalid input(s): "APPERANCEUR"    Imaging: CT EXTREMITY LOWER RIGHT WO CONTRAST  Result Date: 06/13/2022 CLINICAL DATA:  Soft tissue infection suspected. Cellulitis. Evaluate for drainable pocket. EXAM: CT OF THE LOWER RIGHT EXTREMITY WITHOUT CONTRAST TECHNIQUE: Multidetector CT imaging of the right lower extremity was performed according to the standard protocol. RADIATION DOSE REDUCTION: This exam was performed  according to the departmental dose-optimization program which includes automated exposure control, adjustment of the mA and/or kV according to patient size and/or use of iterative reconstruction technique. COMPARISON:  None Available. FINDINGS: Bones/Joint/Cartilage No evidence of fracture or dislocation. Multiple lucencies in the distal femur as well as scattered lucencies in the tibia suggesting osteopenia. Plate and screw fixation of the medial aspect of the proximal tibia. Ligaments Suboptimally assessed by CT. Muscles and Tendons Generalized muscle atrophy. No intramuscular fluid collection or abscess. Soft tissues Marked skin thickening and subcutaneous soft tissue edema. There are multiple punctate foci of gas in the posterior aspect of the mid to distal calf without evidence of drainable fluid collection or abscess. IMPRESSION: 1. Marked skin thickening and subcutaneous soft tissue edema consistent with cellulitis. No evidence of drainable fluid collection or abscess. 2. Multiple punctate foci of gas in the posterior aspect of the mid to distal calf without evidence of drainable fluid collection or abscess. 3. No  evidence of fracture or dislocation. 4. Multiple lucencies in the distal femur and tibia suggesting osteopenia. Electronically Signed   By: Keane Police D.O.   On: 06/13/2022 23:02     Medications:    anticoagulant sodium citrate     [START ON 06/16/2022] ertapenem     vancomycin Stopped (06/14/22 1828)    atorvastatin  80 mg Oral QHS   calcitRIOL  0.25 mcg Oral Daily   carvedilol  25 mg Oral BID   Chlorhexidine Gluconate Cloth  6 each Topical Daily   ferrous sulfate  325 mg Oral Daily   folic acid  1 mg Oral Daily   gabapentin  200 mg Oral BID   insulin aspart  0-20 Units Subcutaneous TID WC   insulin aspart  0-5 Units Subcutaneous QHS   insulin glargine-yfgn  10 Units Subcutaneous QHS   leptospermum manuka honey  1 Application Topical Daily   polyethylene glycol  34 g Oral Daily   torsemide  10 mg Oral Daily   acetaminophen **OR** acetaminophen, albuterol, alteplase, anticoagulant sodium citrate, Glycerin (Adult), heparin, HYDROcodone-acetaminophen, HYDROmorphone (DILAUDID) injection, lidocaine (PF), lidocaine-prilocaine, ondansetron **OR** ondansetron (ZOFRAN) IV, pentafluoroprop-tetrafluoroeth, traZODone  Assessment/ Plan:  [name] is a 72 y.o.  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  Patient presents to the emergency department with increased swelling and pain from right lower extremity.  She has been admitted for Wound infection [T14.8XXA, L08.9] Osteomyelitis of right leg (HCC) [M86.9]   End-stage renal disease on hemodialysis. Dialysis received yesterday, UF 0.5L achieved. Next treatment scheduled for Wednesday.  2. Anemia of chronic kidney disease Lab Results  Component Value Date   HGB 8.1 (L) 06/15/2022    Patient receives Mircera at outpatient clinic. Hgb stable for now, will monitor for need of  ESA's  3. Secondary Hyperparathyroidism: with outpatient labs: PTH 463, phosphorus 6.8, calcium 8.4 on 05/17/22.    Lab Results  Component Value Date   CALCIUM 7.6 (L) 06/15/2022   CAION 1.13 (L) 04/16/2022   PHOS 4.6 06/14/2022    Currently prescribed calcitriol, cholecalciferol, and calcium acetate outpatient.  Calcium decreased. Continue calcitriol daily.  4. Diabetes mellitus type II with chronic kidney disease/renal manifestations: insulin dependent. Home regimen includes Lantus.   Glucose well controlled, primary team will manage SSI     LOS: [place] 2/6/20241:19 PM

## 2022-06-15 NOTE — Progress Notes (Signed)
PROGRESS NOTE    Jody Nelson  WGN:[id number] DOB: 12/30/1950 DOA: 06/12/2022 PCP: Patient, No Pcp Per     Brief Narrative:   From admission h and p Jody Nelson is a 72 y.o. 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  On the day of arrival she was having a catch-up dialysis session which had to be terminated early due to inability to prop up her leg because of the pain.  She denies fever or chills.  She denies chest pain or shortness of breath.    Assessment & Plan:   Principal Problem:   Cellulitis and abscess of right leg Active Problems:   Non-healing wound of right lower extremity   Lymphedema of both lower extremities   Uncontrolled type 2 diabetes mellitus with hyperglycemia, with long-term current use of insulin (HCC)   ESRD on hemodialysis (HCC)   Chronic anticoagulation   Hypercoagulable state (positive anticardiolipin, elevated homocysteine, factor V Leiden deficiency)   (HFpEF) heart failure with preserved ejection fraction (HCC)   H/O: CVA (cerebrovascular accident)   History of obstructive sleep apnea   Essential hypertension   Morbid obesity with BMI of 50.0-59.9, adult (HCC)   Sarcoidosis   Anemia of chronic renal failure   Decubitus ulcer of heel, bilateral   Wound infection   Cellulitis/abscess of right leg without foot Nonhealing wound  right shin/possible osteomyelitis History of right tibial plateau fracture 2019 with hardware and chronic wound infection Chronic lymphedema Infected hardware that leg 2019-2020. Daughter says that healed but has had wound after fall RLE since 04/2022 and has had worsening wounds since then. At baseline moves very little, mainly gets around with a motorized wheelchair. Purulence draining from sinus track in right leg, culture sent from probe that went in a good inch.  MRI and CT do not show signs of osteo Unfortunately abx started prior to blood cultures   Continue vanc; zosyn changed to ertapenem by ID yesterday given MDR colonization hx S/p I and D with ortho today, has wound vac PT/OT consults placed Wound care consulted   Uncontrolled type 2 diabetes mellitus with hyperglycemia, with long-term current use of insulin ([place]) Blood sugar on arrival was 326 improved to normal. A1c 8.7 Continue basal insulin  Sliding scale coverage   ESRD on hemodialysis [facility]) Nephrology consult for continuation of dialysis, gets mwf at home, continuing that schedule here   Chronic anticoagulation Hypercoagulability 2020 (positive anticardiolipin, elevated homocysteine, factor V Leiden deficiency) h/o multiple cryptogenic CVAs -Patient underwent ability workup in March 2020 for cryptogenic strokes remarkable for low positive anticardiolipin IgM, elevated homocysteine, and Factor V Leiden Deficiency. No h/o venous blood clots.  -ASA was d/c'ed and she was started on Eliquis  -Continue Eliquis, resume tomorrow   Decubitus ulcer of heel, bilateral Wound care consulted   Anemia of chronic renal failure Hemoglobin 9.4 on presentation, down from 12.3 a month prior.  Here stable  around 8, no bleeding reported - Continue to trend   Morbid obesity with BMI of 50.0-59.9, adult (HCC) Complicating factor to overall prognosis and care   Essential hypertension Blood pressure controlled Continue carvedilol    History of obstructive sleep apnea CPAP nightly   H/O: CVA (cerebrovascular accident) History of multiple CVAs with residual right-sided weakness Continue atorvastatin and apixaban   (HFpEF) heart failure with preserved ejection fraction (HCC) Clinically euvolemic Continue Demadex, carvedilol   DVT prophylaxis: apixaban Code Status: full Family Communication: daughter updated @ bedside 2/6  Level of care: Med-Surg Status is: Inpatient Remains inpatient appropriate because: severity of illness Dispo: return to snf ([facility]) where she resides    Consultants:  Ortho, ID  Procedures: none  Antimicrobials:  Vanc/zosyn    Subjective: Right leg pain mild after surgery  Objective: Vitals:   06/15/22 1545 06/15/22 1548 06/15/22 1555 06/15/22 1625  BP: 131/76   (!) 110/55  Pulse: 76 80 78 78  Resp: 12 12 11 18   Temp: (!) 97.1 F (36.2 C)   97.8 F (36.6 C)  TempSrc:    Oral  SpO2: 96% 94% 93% 95%  Weight:      Height:        Intake/Output Summary (Last 24 hours) at 06/15/2022 1708 Last data filed at 06/15/2022 1510 Gross per 24 hour  Intake 836.67 ml  Output 525 ml  Net 311.67 ml   Filed Weights   06/12/22 1710 06/14/22 1845  Weight: 135.6 kg (!) 139.9 kg    Examination:  General exam: Appears calm and comfortable  Respiratory system: normal wob Cardiovascular system: S1 & S2 heard, soft systolic murmur Gastrointestinal system: Abdomen is obese, soft and nontender. No organomegaly or masses felt. Normal bowel sounds heard. Central nervous system: Alert and oriented. No focal neurological deficits. Extremities: Symmetric 5 x 5 power. Skin: wound vac applied to RLE Psychiatry: Judgement and insight appear normal. Mood & affect appropriate.     Data Reviewed: I have personally reviewed following labs and imaging studies  CBC: Recent Labs  Lab 06/12/22 1936 06/13/22 0803 06/14/22 0257 06/15/22 0302  WBC 16.4* 10.9* 9.8 7.9  NEUTROABS 13.0*  --    --   --   HGB 9.4* 8.0* 7.9* 8.1*  HCT 32.0* 26.3* 26.4* 27.0*  MCV 97.0 95.3 94.6 93.1  PLT 164 146* 186 852   Basic Metabolic Panel: Recent Labs  Lab 06/12/22 1936 06/13/22 0803 06/14/22 0257 06/15/22 0302  NA 134* 136 135 135  K 4.5 4.4 4.9 3.8  CL 99 105 99 98  CO2 22 23 23 28   GLUCOSE 276* 281* 122* 152*  BUN 33* 37* 45* 27*  CREATININE 5.46* 5.17* 6.28* 3.96*  CALCIUM 8.4* 7.0* 8.0* 7.6*  PHOS  --   --  4.6  --    GFR: Estimated Creatinine Clearance: 18.8 mL/min (A) (by C-G formula based on SCr of 3.96 mg/dL (H)). Liver Function Tests: No results for input(s): "AST", "ALT", "ALKPHOS", "BILITOT", "PROT", "ALBUMIN" in the last 168 hours. No results for input(s): "LIPASE", "AMYLASE" in the last 168 hours. No results for input(s): "AMMONIA" in the last 168 hours. Coagulation Profile: No results for input(s): "INR", "PROTIME" in the last 168 hours. Cardiac Enzymes: No results for input(s): "CKTOTAL", "CKMB", "CKMBINDEX", "TROPONINI" in the last 168 hours. BNP (last 3 results) No results for input(s): "PROBNP" in the last 8760 hours. HbA1C: Recent Labs    06/13/22 0803  HGBA1C 8.7*   CBG: Recent Labs  Lab 06/14/22 2034 06/15/22 0804 06/15/22 1150 06/15/22 1332 06/15/22 1522  GLUCAP 124* 132* 98 82 87   Lipid Profile: No results for input(s): "CHOL", "HDL", "LDLCALC", "TRIG", "CHOLHDL", "LDLDIRECT" in the last 72 hours. Thyroid Function Tests: No results for input(s): "TSH", "T4TOTAL", "FREET4", "T3FREE", "THYROIDAB" in the last 72 hours. Anemia Panel: No results for input(s): "VITAMINB12", "FOLATE", "FERRITIN", "TIBC", "IRON", "RETICCTPCT" in the last 72 hours. Urine analysis: No results found for: "COLORURINE", "APPEARANCEUR", "LABSPEC", "PHURINE", "GLUCOSEU", "HGBUR", "BILIRUBINUR", "KETONESUR", "PROTEINUR", "UROBILINOGEN", "NITRITE", "LEUKOCYTESUR" Sepsis Labs: @LABRCNTIP (procalcitonin:4,lacticidven:4)  ) Recent Results (from the past 240 hour(s))   MRSA Next Gen by PCR, Nasal     Status: None   Collection Time: 06/13/22 12:45 AM   Specimen: Nasal Mucosa; Nasal Swab  Result Value Ref Range Status   MRSA by PCR Next Gen NOT DETECTED NOT DETECTED Final    Comment: (NOTE) The GeneXpert MRSA Assay (FDA approved for NASAL specimens only), is one component of a comprehensive MRSA colonization surveillance program. It is not intended to diagnose MRSA infection nor to guide or monitor treatment for MRSA infections. Test performance is not FDA approved in patients less than 12 years old. Performed at [facility], [address] [place], [place] [address]   Culture, blood (Routine X 2) w Reflex to ID Panel     Status: None (Preliminary result)   Collection Time: 06/13/22  9:34 AM   Specimen: BLOOD  Result Value Ref Range Status   Specimen Description BLOOD BLOOD LEFT HAND  Final   Special Requests   Final    BOTTLES DRAWN AEROBIC AND ANAEROBIC Blood Culture adequate volume   Culture   Final    NO GROWTH 2 DAYS Performed at [facility], [address] [place], [place] [address]    Report Status PENDING  Incomplete  Aerobic Culture w Gram Stain (superficial specimen)     Status: None (Preliminary result)   Collection Time: 06/13/22  9:35 AM   Specimen: Abscess  Result Value Ref Range Status   Specimen Description   Final    ABSCESS S T Performed at [facility], [address] [place], [place] [address]    Special Requests   Final    NONE Performed at [facility], [address] [place], [place] [address]    Gram Stain   Final    FEW GRAM POSITIVE COCCI IN SINGLES IN PAIRS RARE GRAM VARIABLE ROD NO WBC SEEN    Culture   Final    FEW MORGANELLA MORGANII FEW GRAM NEGATIVE RODS FEW STAPHYLOCOCCUS AUREUS SUSCEPTIBILITIES TO FOLLOW Performed at [facility], [place] [address], [place],  [address]    Report Status PENDING  Incomplete   Organism ID, Bacteria MORGANELLA  MORGANII  Final      Susceptibility   Morganella morganii - MIC*    AMPICILLIN >=32 RESISTANT Resistant     CEFAZOLIN RESISTANT Resistant     CEFTAZIDIME <=1 SENSITIVE Sensitive     CIPROFLOXACIN >=4 RESISTANT Resistant     GENTAMICIN <=1 SENSITIVE Sensitive     IMIPENEM 0.5 SENSITIVE Sensitive     TRIMETH/SULFA >=320 RESISTANT Resistant     AMPICILLIN/SULBACTAM 16 INTERMEDIATE Intermediate     PIP/TAZO <=4 SENSITIVE Sensitive     * FEW MORGANELLA MORGANII  Culture, blood (Routine X 2) w Reflex to ID Panel     Status: None (Preliminary result)   Collection Time: 06/13/22  2:23 PM   Specimen: BLOOD  Result  Value Ref Range Status   Specimen Description BLOOD BLOOD RIGHT FOREARM  Final   Special Requests   Final    BOTTLES DRAWN AEROBIC AND ANAEROBIC Blood Culture adequate volume   Culture   Final    NO GROWTH 2 DAYS Performed at [facility], [address], [place], [place] [address]    Report Status PENDING  Incomplete         Radiology Studies: CT EXTREMITY LOWER RIGHT WO CONTRAST  Result Date: 06/13/2022 CLINICAL DATA:  Soft tissue infection suspected. Cellulitis. Evaluate for drainable pocket. EXAM: CT OF THE LOWER RIGHT EXTREMITY WITHOUT CONTRAST TECHNIQUE: Multidetector CT imaging of the right lower extremity was performed according to the standard protocol. RADIATION DOSE REDUCTION: This exam was performed according to the departmental dose-optimization program which includes automated exposure control, adjustment of the mA and/or kV according to patient size and/or use of iterative reconstruction technique. COMPARISON:  None Available. FINDINGS: Bones/Joint/Cartilage No evidence of fracture or dislocation. Multiple lucencies in the distal femur as well as scattered lucencies in the tibia suggesting osteopenia. Plate and screw fixation of the medial aspect of the proximal tibia. Ligaments Suboptimally assessed by CT. Muscles and Tendons Generalized muscle atrophy. No  intramuscular fluid collection or abscess. Soft tissues Marked skin thickening and subcutaneous soft tissue edema. There are multiple punctate foci of gas in the posterior aspect of the mid to distal calf without evidence of drainable fluid collection or abscess. IMPRESSION: 1. Marked skin thickening and subcutaneous soft tissue edema consistent with cellulitis. No evidence of drainable fluid collection or abscess. 2. Multiple punctate foci of gas in the posterior aspect of the mid to distal calf without evidence of drainable fluid collection or abscess. 3. No evidence of fracture or dislocation. 4. Multiple lucencies in the distal femur and tibia suggesting osteopenia. Electronically Signed   By: Keane Police D.O.   On: 06/13/2022 23:02        Scheduled Meds:  [START ON 06/16/2022] apixaban  5 mg Oral Q12H   atorvastatin  80 mg Oral QHS   calcitRIOL  0.25 mcg Oral Daily   carvedilol  25 mg Oral BID   Chlorhexidine Gluconate Cloth  6 each Topical Daily   ferrous sulfate  325 mg Oral Daily   folic acid  1 mg Oral Daily   gabapentin  200 mg Oral BID   insulin aspart  0-20 Units Subcutaneous TID WC   insulin aspart  0-5 Units Subcutaneous QHS   insulin glargine-yfgn  10 Units Subcutaneous QHS   leptospermum manuka honey  1 Application Topical Daily   polyethylene glycol  34 g Oral Daily   torsemide  10 mg Oral Daily   Continuous Infusions:  anticoagulant sodium citrate     [START ON 06/16/2022] ertapenem     vancomycin Stopped (06/14/22 1828)     LOS: 3 days     Desma Maxim, MD [facility]   If 7PM-7AM, please contact night-coverage [email or web address] Password [email or web address] 06/15/2022, 5:08 PM

## 2022-06-15 NOTE — Transfer of Care (Signed)
Immediate Anesthesia Transfer of Care Note  Patient: Jody Nelson  Procedure(s) Performed: IRRIGATION AND DEBRIDEMENT WOUND (Right) APPLICATION OF WOUND VAC (Right)  Patient Location: PACU  Anesthesia Type:General  Level of Consciousness: drowsy  Airway & Oxygen Therapy: Patient Spontanous Breathing and Patient connected to face mask oxygen  Post-op Assessment: Report given to RN and Post -op Vital signs reviewed and stable  Post vital signs: Reviewed and stable  Last Vitals:  Vitals Value Taken Time  BP 175/66 06/15/22 1515  Temp 36.1 1515  Pulse 66 06/15/22 1518  Resp 17 06/15/22 1519  SpO2 97 % 06/15/22 1518  Vitals shown include unvalidated device data.  Last Pain:  Vitals:   06/15/22 1342  TempSrc: Temporal  PainSc: 8          Complications: No notable events documented.

## 2022-06-16 ENCOUNTER — Encounter: Payer: Self-pay | Admitting: Orthopedic Surgery

## 2022-06-16 DIAGNOSIS — N186 End stage renal disease: Secondary | ICD-10-CM | POA: Diagnosis not present

## 2022-06-16 DIAGNOSIS — L03115 Cellulitis of right lower limb: Secondary | ICD-10-CM

## 2022-06-16 DIAGNOSIS — L89629 Pressure ulcer of left heel, unspecified stage: Secondary | ICD-10-CM

## 2022-06-16 DIAGNOSIS — L89619 Pressure ulcer of right heel, unspecified stage: Secondary | ICD-10-CM

## 2022-06-16 DIAGNOSIS — S81801A Unspecified open wound, right lower leg, initial encounter: Secondary | ICD-10-CM | POA: Diagnosis not present

## 2022-06-16 DIAGNOSIS — E1165 Type 2 diabetes mellitus with hyperglycemia: Secondary | ICD-10-CM

## 2022-06-16 DIAGNOSIS — E1122 Type 2 diabetes mellitus with diabetic chronic kidney disease: Secondary | ICD-10-CM

## 2022-06-16 DIAGNOSIS — Z794 Long term (current) use of insulin: Secondary | ICD-10-CM

## 2022-06-16 DIAGNOSIS — L02415 Cutaneous abscess of right lower limb: Secondary | ICD-10-CM | POA: Diagnosis not present

## 2022-06-16 DIAGNOSIS — Z992 Dependence on renal dialysis: Secondary | ICD-10-CM

## 2022-06-16 LAB — CBC
HCT: 27.7 % — ABNORMAL LOW (ref 36.0–46.0)
Hemoglobin: 8.4 g/dL — ABNORMAL LOW (ref 12.0–15.0)
MCH: 28.2 pg (ref 26.0–34.0)
MCHC: 30.3 g/dL (ref 30.0–36.0)
MCV: 93 fL (ref 80.0–100.0)
Platelets: 224 10*3/uL (ref 150–400)
RBC: 2.98 MIL/uL — ABNORMAL LOW (ref 3.87–5.11)
RDW: 15.3 % (ref 11.5–15.5)
WBC: 11.1 10*3/uL — ABNORMAL HIGH (ref 4.0–10.5)
nRBC: 0 % (ref 0.0–0.2)

## 2022-06-16 LAB — FERRITIN: Ferritin: 678 ng/mL — ABNORMAL HIGH (ref 11–307)

## 2022-06-16 LAB — IRON AND TIBC: Iron: 22 ug/dL — ABNORMAL LOW (ref 28–170)

## 2022-06-16 LAB — HEPATITIS B E ANTIBODY: Hep B E Ab: NEGATIVE

## 2022-06-16 LAB — BASIC METABOLIC PANEL
Anion gap: 13 (ref 5–15)
BUN: 38 mg/dL — ABNORMAL HIGH (ref 8–23)
CO2: 25 mmol/L (ref 22–32)
Calcium: 8 mg/dL — ABNORMAL LOW (ref 8.9–10.3)
Chloride: 98 mmol/L (ref 98–111)
Creatinine, Ser: 4.79 mg/dL — ABNORMAL HIGH (ref 0.44–1.00)
GFR, Estimated: 9 mL/min — ABNORMAL LOW (ref >60)
Glucose, Bld: 247 mg/dL — ABNORMAL HIGH (ref 70–99)
Potassium: 4.9 mmol/L (ref 3.5–5.1)
Sodium: 136 mmol/L (ref 135–145)

## 2022-06-16 LAB — GLUCOSE, CAPILLARY
Glucose-Capillary: 223 mg/dL — ABNORMAL HIGH (ref 70–99)
Glucose-Capillary: 257 mg/dL — ABNORMAL HIGH (ref 70–99)
Glucose-Capillary: 231 mg/dL — ABNORMAL HIGH (ref 70–99)
Glucose-Capillary: 304 mg/dL — ABNORMAL HIGH (ref 70–99)

## 2022-06-16 MED ORDER — SODIUM CHLORIDE 0.9 % IV SOLN
1.0000 g | Freq: Every day | INTRAVENOUS | Status: DC
  Administered 2022-06-16 – 2022-06-17 (×2): 1 g via INTRAVENOUS
  Filled 2022-06-16 (×2): qty 10

## 2022-06-16 NOTE — NC FL2 (Signed)
[place] LEVEL OF CARE FORM     IDENTIFICATION  Patient Name: Jody Nelson Birthdate: 05-05-1951 Sex: female Admission Date (Current Location): 06/12/2022  [facility] and [place] Number:  Engineering geologist and Address:         Provider Number: [id number]  Attending Physician Name and Address:  Sharen Hones, MD  Relative Name and Phone Number:       Current Level of Care: Hospital Recommended Level of Care: [place] Prior Approval Number:    Date Approved/Denied:   PASRR Number: [id number]  Discharge Plan: SNF    Current Diagnoses: Patient Active Problem List   Diagnosis Date Noted   Wound infection 06/14/2022   Decubitus ulcer of heel, bilateral 06/13/2022   Hypercoagulable state (positive anticardiolipin, elevated homocysteine, factor V Leiden deficiency) 06/13/2022   Osteomyelitis of right leg ([place]) 06/12/2022   Non-healing wound of right lower extremity 06/12/2022   Cellulitis and abscess of right leg 06/12/2022   Chronic anticoagulation 06/12/2022   ESRD on hemodialysis ([place]) 12/27/2021   (HFpEF) heart failure with preserved ejection fraction ([place]) 01/31/2021   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   Hereditary and idiopathic neuropathy, unspecified 10/02/2020   History of falling 10/02/2020   Insomnia, unspecified 10/02/2020   Muscle weakness (generalized) 10/02/2020   Resistance to multiple antibiotics 10/02/2020   Unspecified abnormalities of gait and mobility 10/02/2020   Unspecified lack of coordination 10/02/2020   Weakness 10/02/2020   Anemia of chronic renal failure 10/02/2020   Multiple drug resistant organism (MDRO) culture positive 09/25/2020   H/O: CVA (cerebrovascular accident) 06/11/2020   Uncontrolled type 2 diabetes mellitus with hyperglycemia, with long-term current use of insulin ([place]) 06/11/2020   Urinary tract infection 06/11/2020   Heel ulcer, left, limited to breakdown of skin ([place]) 01/04/2020    Leg ulcer, left, limited to breakdown of skin ([place]) 01/04/2020   Lymphedema, not elsewhere classified 01/04/2020   Chest pain, atypical 09/10/2019   Anemia, unspecified 09/19/2018   Cellulitis of left lower extremity 09/19/2018   Sepsis ([place]) 09/19/2018   Anticardiolipin antibody positive 08/16/2018   Elevated homocysteine 08/16/2018   Heterozygous factor V Leiden mutation ([place]) 08/16/2018   History of obstructive sleep apnea 07/18/2018   Ischemic stroke ([place]) 07/18/2018   Acute kidney failure, unspecified ([place]) 07/02/2018   Acute kidney injury superimposed on CKD ([place]) 06/29/2018   Encounter for other specified surgical aftercare 05/11/2018   Proteus (mirabilis) (morganii) as the cause of diseases classified elsewhere 05/11/2018   CKD (chronic kidney disease) stage 3, GFR 30-59 ml/min (HCC) 03/22/2018   Closed fracture of proximal end of left humerus 03/22/2018   Closed fracture of right tibial plateau with routine healing 03/22/2018   Hyperlipidemia with target LDL less than 70 07/14/2017   Morbid (severe) obesity due to excess calories ([place]) 10/20/2016   Unspecified sequelae of cerebral infarction 10/20/2016   Unspecified diastolic (congestive) heart failure ([place]) 10/20/2016   Type 2 diabetes mellitus without complications ([place]) 38/75/6433   Primary osteoarthritis of both knees 05/21/2016   History of influenza 05/04/2016   Dyspnea on effort 03/12/2016   GERD (gastroesophageal reflux disease) 01/27/2016   Calculus of gallbladder without cholecystitis without obstruction 10/21/2015   Pseudophakia of left eye 10/07/2015   Pseudophakia of right eye 08/26/2015   Morbid obesity with BMI of 50.0-59.9, adult ([place]) 07/29/2015   Lymphedema of both lower extremities 07/08/2015   Hallux valgus, acquired 07/08/2015   Onychomycosis due to dermatophyte 07/08/2015   At high risk  for falls 12/10/2014   Essential hypertension 12/10/2014   Hypertension associated with type 2 diabetes mellitus  ([place]) 12/10/2014   Vitamin D deficiency 11/21/2014   Diabetes mellitus with insulin therapy ([place]) 10/13/2012   Sarcoidosis 01/31/2012    Orientation RESPIRATION BLADDER Height & Weight     Self, Time, Situation, Place  Normal Incontinent Weight: (!) 138.1 kg Height:  5\' 4"  (162.6 cm)  BEHAVIORAL SYMPTOMS/MOOD NEUROLOGICAL BOWEL NUTRITION STATUS      Incontinent Diet (Carb modified heart healthy)  AMBULATORY STATUS COMMUNICATION OF NEEDS Skin   Total Care Verbally PU Stage and Appropriate Care, Surgical wounds, Wound Vac                       Personal Care Assistance Level of Assistance              Functional Limitations Info             SPECIAL CARE FACTORS FREQUENCY                       Contractures      Additional Factors Info   (Hemodialysis) Code Status Info: Full Allergies Info: Sulfamethoxazole-trimethoprim     Isolation Precautions Info: Contact     Current Medications (06/16/2022):  This is the current hospital active medication list Current Facility-Administered Medications  Medication Dose Route Frequency Provider Last Rate Last Admin   acetaminophen (TYLENOL) tablet 650 mg  650 mg Oral Q6H PRN Leim Fabry, MD       Or   acetaminophen (TYLENOL) suppository 650 mg  650 mg Rectal Q6H PRN Leim Fabry, MD       albuterol (PROVENTIL) (2.5 MG/3ML) 0.083% nebulizer solution 2.5 mg  2.5 mg Nebulization Q2H PRN Leim Fabry, MD       alteplase (CATHFLO ACTIVASE) injection 2 mg  2 mg Intracatheter Once PRN Leim Fabry, MD       anticoagulant sodium citrate solution 5 mL  5 mL Intracatheter PRN Leim Fabry, MD       apixaban Arne Cleveland) tablet 5 mg  5 mg Oral Q12H Leim Fabry, MD   5 mg at 06/16/22 1010   atorvastatin (LIPITOR) tablet 80 mg  80 mg Oral QHS Leim Fabry, MD   80 mg at 06/15/22 2205   calcitRIOL (ROCALTROL) capsule 0.25 mcg  0.25 mcg Oral Daily Leim Fabry, MD   0.25 mcg at 06/15/22 1025   carvedilol (COREG) tablet 25 mg  25 mg  Oral BID Leim Fabry, MD   25 mg at 06/16/22 0854   ceFEPIme (MAXIPIME) 1 g in sodium chloride 0.9 % 100 mL IVPB  1 g Intravenous QHS Mignon Pine, DO       Chlorhexidine Gluconate Cloth 2 % PADS 6 each  6 each Topical Daily Leim Fabry, MD   6 each at 06/16/22 8088   ferrous sulfate tablet 325 mg  325 mg Oral Daily Leim Fabry, MD   325 mg at 03/12/14 9458   folic acid (FOLVITE) tablet 1 mg  1 mg Oral Daily Leim Fabry, MD   1 mg at 06/15/22 1024   gabapentin (NEURONTIN) capsule 200 mg  200 mg Oral BID Leim Fabry, MD   200 mg at 06/15/22 2205   Glycerin (Adult) 2 g suppository 1 suppository  1 suppository Rectal Daily PRN Leim Fabry, MD   1 suppository at 06/14/22 1359   heparin injection 1,000 Units  1,000 Units Intracatheter PRN Leim Fabry,  MD       HYDROcodone-acetaminophen (NORCO/VICODIN) 5-325 MG per tablet 1 tablet  1 tablet Oral Q8H PRN Leim Fabry, MD   1 tablet at 06/16/22 0542   HYDROmorphone (DILAUDID) injection 0.5 mg  0.5 mg Intravenous Q2H PRN Leim Fabry, MD   0.5 mg at 06/16/22 1550   insulin aspart (novoLOG) injection 0-20 Units  0-20 Units Subcutaneous TID WC Leim Fabry, MD   7 Units at 06/16/22 1800   insulin aspart (novoLOG) injection 0-5 Units  0-5 Units Subcutaneous QHS Leim Fabry, MD   2 Units at 06/13/22 0030   insulin glargine-yfgn (SEMGLEE) injection 10 Units  10 Units Subcutaneous QHS Leim Fabry, MD   10 Units at 06/15/22 2206   leptospermum manuka honey (MEDIHONEY) paste 1 Application  1 Application Topical Daily Sharen Hones, MD   1 Application at 51/76/16 0901   lidocaine (PF) (XYLOCAINE) 1 % injection 5 mL  5 mL Intradermal PRN Leim Fabry, MD       lidocaine-prilocaine (EMLA) cream 1 Application  1 Application Topical PRN Leim Fabry, MD       ondansetron [facility]) tablet 4 mg  4 mg Oral Q6H PRN Leim Fabry, MD       Or   ondansetron [facility]) injection 4 mg  4 mg Intravenous Q6H PRN Leim Fabry, MD       pentafluoroprop-tetrafluoroeth  Landry Dyke) aerosol 1 Application  1 Application Topical PRN Leim Fabry, MD       polyethylene glycol (MIRALAX / GLYCOLAX) packet 34 g  34 g Oral Daily Leim Fabry, MD   34 g at 06/14/22 1145   torsemide (DEMADEX) tablet 10 mg  10 mg Oral Daily Leim Fabry, MD   10 mg at 06/15/22 1025   traZODone (DESYREL) tablet 50 mg  50 mg Oral QHS PRN Leim Fabry, MD       vancomycin (VANCOCIN) IVPB 1000 mg/200 mL premix  1,000 mg Intravenous Q M,W,F-HD Leim Fabry, MD 200 mL/hr at 06/16/22 1558 1,000 mg at 06/16/22 1558     Discharge Medications: Please see discharge summary for a list of discharge medications.  Relevant Imaging Results:  Relevant Lab Results:   Additional Information ss [id number]  Beverly Sessions, RN

## 2022-06-16 NOTE — Consult Note (Signed)
Pharmacy Antibiotic Note  Jody Nelson is a 72 y.o. female admitted on 06/12/2022 with cellulitis and nonhealing wound of R leg. 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 Last HD session PTA on Saturday 2/3 (did not complete). No evidence of OM on imaging. 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 She completed 6 weeks fo vancomycin and ceftriaxone IV plus PO metronidazole followed by PO doxycyline and cefadroxil. 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 For this reason, Zosyn was changed to ertapenem on 2/5. Ertapenem not given 2/5, so it was re-scheduled to start 2/6. Patient underwent I&D on 2/6, cultures sent. Pharmacy has been consulted for vancomycin dosing.  Plan: Day 4 of antibiotics Continue vancomycin 1000 mg IV QHD (MWF). Goal trough 15-25 mcg/mL Plan to check vancomycin level prior to 4th maintenance dose (anticipate 06/18/22)  Patient is also on ertapenem 500 mg IV Q24H  Continue to monitor HD schedule and follow final culture results   Height: 5\' 4"  (162.6 cm) Weight: (!) 139.9 kg (308 lb 6.8 oz) IBW/kg (Calculated) : 54.7  Temp (24hrs), Avg:97.7 F (36.5 C), Min:97 F (36.1 C), Max:99 F (37.2 C)  Recent Labs  Lab 06/12/22 1936 06/13/22 0803 06/14/22 0257 06/15/22 0302 06/16/22 0153  WBC 16.4* 10.9* 9.8 7.9 11.1*  CREATININE 5.46* 5.17* 6.28* 3.96* 4.79*  LATICACIDVEN 2.2* 1.5  --   --   --      Estimated Creatinine Clearance: 15.5 mL/min (A) (by C-G formula based on SCr of 4.79 mg/dL (H)).    Allergies  Allergen Reactions   Sulfamethoxazole-Trimethoprim     Other reaction(s): Kidney Disorder Other reaction(s): Kidney Disorder Hyperkalemia, AKI Hyperkalemia, AKI     Antimicrobials this admission: 2/3 Zosyn >> 2/5 2/3 Vancomycin >>  2/4 Ertapenem >>  Dose adjustments this  admission: N/A  Microbiology results: 2/4 BCx: NG2D 2/4 MRSA PCR: negative 2/4 Abscess cx: few Morganella morganii (R- ampicillin, cefazolin, ciprofloxacin, TMP/SMX), E. coli (R- ampicillin, R-ciprofloxacin, TMP/SMX), Staph aureus (susceptibilities pending) 2/6 RL Wound Cx: NG<12H  Thank you for allowing pharmacy to be a part of this patient's care.  Gretel Acre, PharmD PGY1 Pharmacy Resident 06/16/2022 7:54 AM

## 2022-06-16 NOTE — TOC Progression Note (Signed)
Transition of Care [facility]) - Progression Note    Patient Details  Name: Jody Nelson MRN: [id number] Date of Birth: 24-May-1950  Transition of Care [facility]) CM/SW Contact  Beverly Sessions, RN Phone Number: 06/16/2022, 6:10 PM  Clinical Narrative:     TOC has not heard back from [facility] and [place] call from daughter Andrewette.  Let her know we have not heard back from the facilities.   She request for referral to be sent to [facility] sent for signature, referral sent to [facility] in [place]  Daughter is aware that if patient is medically ready for discharge prior to another SNF bed found she will have to return to [facility] notified that patient has wound vac       Expected Discharge Plan and Services                                               Social Determinants of Health (SDOH) Interventions SDOH Screenings   Food Insecurity: No Food Insecurity (06/12/2022)  Housing: Low Risk  (06/12/2022)  Transportation Needs: No Transportation Needs (06/12/2022)  Utilities: Not At Risk (06/12/2022)  Tobacco Use: Medium Risk (06/16/2022)    Readmission Risk Interventions     No data to display

## 2022-06-16 NOTE — Progress Notes (Signed)
Subjective: 1 Day Post-Op Procedure(s) (LRB): IRRIGATION AND DEBRIDEMENT WOUND (Right) APPLICATION OF WOUND VAC (Right) Patient reports pain as moderate.   Patient is well, and has had no acute complaints or problems.  Complained of tightness in the right leg. Plan is to go Rehab after hospital stay. Negative for chest pain and shortness of breath Fever: no Gastrointestinal: Negative for nausea and vomiting  Objective: Vital signs in last 24 hours: Temp:  [97 F (36.1 C)-99 F (37.2 C)] 97.9 F (36.6 C) (02/06 2301) Pulse Rate:  [73-82] 73 (02/06 2301) Resp:  [11-20] 20 (02/06 2301) BP: (110-146)/(42-76) 146/57 (02/06 2301) SpO2:  [93 %-100 %] 100 % (02/06 2301)  Intake/Output from previous day:  Intake/Output Summary (Last 24 hours) at 06/16/2022 2831 Last data filed at 06/16/2022 0500 Gross per 24 hour  Intake 650 ml  Output 75 ml  Net 575 ml    Intake/Output this shift: No intake/output data recorded.  Labs: Recent Labs    06/13/22 0803 06/14/22 0257 06/15/22 0302 06/16/22 0153  HGB 8.0* 7.9* 8.1* 8.4*   Recent Labs    06/15/22 0302 06/16/22 0153  WBC 7.9 11.1*  RBC 2.90* 2.98*  HCT 27.0* 27.7*  PLT 214 224   Recent Labs    06/15/22 0302 06/16/22 0153  NA 135 136  K 3.8 4.9  CL 98 98  CO2 28 25  BUN 27* 38*  CREATININE 3.96* 4.79*  GLUCOSE 152* 247*  CALCIUM 7.6* 8.0*   No results for input(s): "LABPT", "INR" in the last 72 hours.   EXAM General - Patient is Alert and Oriented Extremity - Sensation intact distally Dorsiflexion/Plantar flexion intact Compartment soft Dressing/Incision - clean, dry, with the wound VAC in place. Motor Function - intact, moving foot and toes well on exam.   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   At high risk for falls    Atypical chest pain    Bilateral lower extremity edema    Chronic right sided weakness    Cryptogenic stroke ([place]) 02/27/2011   a.) MRI brain 02/27/2011: old lacunar infarct in RIGHT pons and medulla oblongata   Cryptogenic stroke ([place]) 10/11/2012   a.) CT brain 10/11/2012: tiny old lacunar infarct in head of caudate nucleus (left)   Cryptogenic stroke ([place]) 10/12/2012   a.) MRI brain 10/12/2012: multiple acute LEFT hemispheric infarcts   Cryptogenic stroke ([place]) 06/26/2014   a.) MRI brain 06/26/2014: acute RIGHT posterior frontal lobe infarct involving both and grey matter   Cryptogenic stroke ([place]) 07/29/2015   a.) MRI brain 07/29/2015: 2 foci of diffusion restriction in the LEFT parietal lobe consistent with acute infarction   Cryptogenic stroke ([place]) 07/24/2017   a.) MRI brain 07/24/2017: punctate focus of acute ischemia in the medial LEFT temporal lobe   DDD (degenerative disc disease), cervical    Depression    DOE (dyspnea on exertion)    Dysarthria as late effect of stroke    ESRD (end stage renal disease) on dialysis ([place])    a.) M-W-F  GERD (gastroesophageal reflux disease)    Hallux valgus, bilateral    Heterozygous factor V Leiden mutation ([place]) 07/18/2018   History of cardiac catheterization 08/30/2008   a.) LHC 08/30/2008: EF >60%, LVEDP 18 mmHg, normal coronaries.   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   a.) 07/18/2018 --> 22 mol/L   Hypertension     Insomnia    a.) melatonin + trazodone PRN   Long term current use of anticoagulant    a.) apixaban   Lymphedema of both lower extremities    Meralgia paresthetica, left lower limb    OSA (obstructive sleep apnea)    a.) does not require nocturnal PAP therapy   Osteoarthritis    Pneumonia    Pseudophakia of both eyes    Sarcoidosis    Secondary hyperparathyroidism of renal origin ([place])    Type 2 diabetes mellitus treated with insulin ([place])    Vitamin D deficiency     Assessment/Plan: 1 Day Post-Op Procedure(s) (LRB): IRRIGATION AND DEBRIDEMENT WOUND (Right) APPLICATION OF WOUND VAC (Right) Principal Problem:   Cellulitis and abscess of right leg Active Problems:   (HFpEF) heart failure with preserved ejection fraction (HCC)   Lymphedema of both lower extremities   H/O: CVA (cerebrovascular accident)   History of obstructive sleep apnea   Essential hypertension   Morbid obesity with BMI of 50.0-59.9, adult ([place])   Sarcoidosis   Uncontrolled type 2 diabetes mellitus with hyperglycemia, with long-term current use of insulin (HCC)   ESRD on hemodialysis (HCC)   Anemia of chronic renal failure   Non-healing wound of right lower extremity   Chronic anticoagulation   Decubitus ulcer of heel, bilateral   Hypercoagulable state (positive anticardiolipin, elevated homocysteine, factor V Leiden deficiency)   Wound infection  Estimated body mass index is 52.94 kg/m as calculated from the following:   Height as of this encounter: 5\' 4"  (1.626 m).   Weight as of this encounter: 139.9 kg. Advance diet Up with therapy  Continue IV antibiotics.  Appreciate wound care involvement.  DVT Prophylaxis -  Eliquis Weight-Bearing as tolerated to right leg  Reche Dixon, PA-C Orthopaedic Surgery 06/16/2022, 7:12 AM

## 2022-06-16 NOTE — TOC Progression Note (Signed)
Transition of Care [facility]) - Progression Note    Patient Details  Name: Jody Nelson MRN: [id number] Date of Birth: 03-25-1951  Transition of Care [facility]) CM/SW [place], LCSW Phone Number: 06/16/2022, 12:15 PM  Clinical Narrative:   This CSW spoke to daughter yesterday. She is interested in moving patient to a facility closer to her in [place] area. CSW asked if there were specific facilities she was interested in. She mentioned [facility] and [facility]. CSW left voicemails for both admissions coordinators.  Expected Discharge Plan and Services                                               Social Determinants of Health (SDOH) Interventions SDOH Screenings   Food Insecurity: No Food Insecurity (06/12/2022)  Housing: Low Risk  (06/12/2022)  Transportation Needs: No Transportation Needs (06/12/2022)  Utilities: Not At Risk (06/12/2022)  Tobacco Use: Medium Risk (06/16/2022)    Readmission Risk Interventions     No data to display

## 2022-06-16 NOTE — Plan of Care (Signed)
  Problem: Skin Integrity: Goal: Skin integrity will improve Outcome: Progressing   Problem: Nutrition: Goal: Adequate nutrition will be maintained Outcome: Progressing   Problem: Pain Managment: Goal: General experience of comfort will improve Outcome: Progressing   Problem: Safety: Goal: Ability to remain free from injury will improve Outcome: Progressing   Problem: Activity: Goal: Risk for activity intolerance will decrease Outcome: Progressing

## 2022-06-16 NOTE — Consult Note (Addendum)
[place] Nurse wound follow up Refer to previous [place] notes from 2/5.  Ortho team now following for assessment and plan of care for right outer leg wounds; I&D was performed in the OR yesterday and 2 pieces of sponge were applied to a Negative pressure wound therapy machine at 122mm const suction.  These dressings are intact with a good seal and 2 track pads Yed together to one machine. Topical treatment orders have been provided for bedside nurses to perform as follows for anterior right leg wounds:  1. Apply Medihoney to right anterior leg wounds Q day, then cover with foam dressing.  Change foam dressings Q 3 days or PRN soiling.  2. Foam dressings to left and right heel wounds Q day, change Q 3 days or PRN soiling Discussed plan of care with ortho team via Secure chat. WOC will change Vac dressing to 2 wounds on right leg beginning Fri, Feb 9, then change Q M/W/F next week. Thank-you,  Julien Girt MSN, [place], [place], [place], [place]

## 2022-06-16 NOTE — Progress Notes (Addendum)
Progress Note   Patient: Jody Nelson JJK:[id number] DOB: Mar 09, 1951 DOA: 06/12/2022     4 DOS: the patient was seen and examined on 06/16/2022   Brief hospital course: Jody Nelson is a 72 y.o. 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  Patient initially was placed on vancomycin and meropenem after seen by ID.  Patient also had a debridement performed 02/06, a wound VAC was placed.  Antibiotic switched to cefepime and vancomycin today by ID, which she can be continued through dialysis.   Principal Problem:   Cellulitis and abscess of right leg Active Problems:   Non-healing wound of right lower extremity   Lymphedema of both lower extremities   Uncontrolled type 2 diabetes mellitus with hyperglycemia, with long-term current use of insulin (HCC)   ESRD on hemodialysis (HCC)   Chronic anticoagulation   Hypercoagulable state (positive anticardiolipin, elevated homocysteine, factor V Leiden deficiency)   (HFpEF) heart failure with preserved ejection fraction (HCC)   H/O: CVA (cerebrovascular accident)   History of obstructive sleep apnea   Essential hypertension   Morbid obesity with BMI of 50.0-59.9, adult (HCC)   Sarcoidosis   Anemia of chronic renal failure   Decubitus ulcer of heel, bilateral   Wound infection   Assessment and Plan: * Cellulitis and abscess of  right leg Nonhealing wound right shin/possible osteomyelitis History of right tibial plateau fracture 2019 with hardware and chronic wound infection Chronic lymphedema Status post I&D with wound VAC.  Appreciate ID consult.  Antibiotic switched to cefepime and vancomycin.  Wound culture results still pending. Clinically, patient condition appears to be improving.   Uncontrolled type 2 diabetes mellitus with hyperglycemia, with long-term current use of insulin (HCC) Hemoglobin A1c 8.7, continue current regimen.  ESRD on hemodialysis [facility]) Hemodialysis per nephrology.  Chronic anticoagulation Hypercoagulability 2020 (positive anticardiolipin, elevated homocysteine, factor V Leiden deficiency) h/o multiple cryptogenic CVAs Continue Eliquis.  Decubitus ulcer of heel, bilateral Wound care following.  Anemia of chronic renal failure Continue to follow, transfuse as needed.  Sarcoidosis Biopsy proven   Morbid obesity with BMI of 50.0-59.9, adult ([place]) Continue to follow, diet exercise advised.  Essential hypertension Continue home medicines.  History of obstructive sleep apnea CPAP nightly  H/O: CVA (cerebrovascular accident) PT will evaluate patient.  (HFpEF) heart failure with preserved ejection fraction (HCC) Condition stable.       Subjective:  Patient doing well, significant weakness.  Wound VAC draining small amount of blood.  Physical Exam: Vitals:   06/16/22 1205 06/16/22 1230 06/16/22 1300 06/16/22 1330  BP:  (!) 118/56 128/66 (!) 138/110  Pulse:  85 77 79  Resp:  18 14 15   Temp:      TempSrc:      SpO2:  100% 99% 100%  Weight: (!) 138.1 kg     Height:       General exam: Appears calm and comfortable, morbid obese. Respiratory system:  Clear to auscultation. Respiratory effort normal. Cardiovascular system: S1 & S2 heard, RRR. No JVD, murmurs, rubs, gallops or clicks.  Gastrointestinal system: Abdomen is nondistended, soft and nontender. No  organomegaly or masses felt. Normal bowel sounds heard. Central nervous system: Alert and oriented. No focal neurological deficits. Extremities: bilateral leg lymphedema Skin: No rashes, lesions or ulcers Psychiatry:  Mood & affect appropriate.    Data Reviewed:  Lab results reviewed.  Family Communication: daughter updated  Disposition: Status is: Inpatient Remains inpatient appropriate because: Severity of disease, IV treatment.     Time spent: 35 minutes  Author: Sharen Hones, MD 06/16/2022 1:51 PM  For on call review [email or web address].

## 2022-06-16 NOTE — Inpatient Diabetes Management (Addendum)
Inpatient Diabetes Program Recommendations  AACE/ADA: New Consensus Statement on Inpatient Glycemic Control (2015)  Target Ranges:  Prepandial:   less than 140 mg/dL      Peak postprandial:   less than 180 mg/dL (1-2 hours)      Critically ill patients:  140 - 180 mg/dL   Lab Results  Component Value Date   GLUCAP 257 (H) 06/16/2022   HGBA1C 8.7 (H) 06/13/2022    Review of Glycemic Control  Latest Reference Range & Units 06/16/22 07:56 06/16/22 11:30  Glucose-Capillary 70 - 99 mg/dL 223 (H) 257 (H)  (H): Data is abnormally high  Latest Reference Range & Units 06/16/22 01:53  GFR, Estimated >60 mL/min 9 (L)  (L): Data is abnormally low  Latest Reference Range & Units 06/16/22 01:53  Creatinine 0.44 - 1.00 mg/dL 4.79 (H)  (H): Data is abnormally high  Diabetes history: DM2 Outpatient Diabetes medications:  Lantus 10 units QD Humalog 2-12 units TID  Current orders for Inpatient glycemic control: Novolog 0-20 units TID and 0-5 units QHS, Semglee 10 units QD Decreased today from 20 units QD)  ESRD/HD  Inpatient Diabetes Program Recommendations:    Please consider Semglee 15 units QD and decrease correction to 0-9 units TID.  Will continue to follow while inpatient.  Thank you, Reche Dixon, MSN, [place] Diabetes Coordinator Inpatient Diabetes Program [phone] (team pager from 8a-5p)

## 2022-06-16 NOTE — Progress Notes (Signed)
[facility] for Infectious Disease  Date of Admission:  06/12/2022           Reason for visit: Follow up on Wound infection                                      Current antibiotics: Vancomycin Ertapenem  ASSESSMENT:    72 y.o. female admitted with:  Cellulitis and infected wounds of right leg: Healing as an outpatient likely complicated by chronic lymphedema and overall frail status (albumin in January 2.9).  No evidence of osteomyelitis noted on imaging this admission.  Wound cultures obtained from deep probing of her wound at admission is polymicrobial with Morganella, E coli, and Staph aureus.  Status post OR with orthopedics 06/15/22 where she underwent debridement of her wounds.  There did not appear to be any involvement of her hardware from prior surgeries and there was no significant involvement of wounds deep to the muscle fascia.  OR cultures are pending with GPC's noted on Gram stain.   Hx of right tibial plateau fracture: This was complicated by osteomyelitis and hardware infection in Nov/Dec 2019.  Cultures at that time grew MRSA and Proteus treated with 6-weeks of Vancomycin and Ceftriaxone IV along with PO Metronidazole followed by Doxycycline and Cefadroxil for planned indefinite suppression.  She was followed by ID at [facility] but has not been seen since June 2020.  Hardware remains in place, but at some point in time her antibiotic suppression fell off as it is not on her nursing home [facility] and she does not adequately recall when this may have happened.  Fortunately, does not appear to have concern for hardware infection at this time and problem #1 is a separate process.  Bilateral heel pressure ulcers: Wound care following.  History of MDR Klebsiella urinary colonization: Noted in Oct 2022. ESRD on HD. Type 2 DM: A1c is 8.7. Morbid obesity: Body mass index is 52.26 kg/m.  RECOMMENDATIONS:    Continue Vancomycin dosed with HD Will change Ertapenem to Cefepime with HD  based on cultures thus far Follow up cultures Contact precautions due to hx of MDRO Wound care She has been off suppressive antibiotics for an undetermined period of time for her osteomyelitis complicated by hardware infection.  Not sure if ongoing suppression will be needed at this time following treatment of her current infection Suspect she would benefit from better management of her lymphedema.  Possibly referral to lymphedema clinic following discharge? Following    Principal Problem:   Cellulitis and abscess of right leg Active Problems:   (HFpEF) heart failure with preserved ejection fraction (HCC)   Lymphedema of both lower extremities   H/O: CVA (cerebrovascular accident)   History of obstructive sleep apnea   Essential hypertension   Morbid obesity with BMI of 50.0-59.9, adult ([place])   Sarcoidosis   Uncontrolled type 2 diabetes mellitus with hyperglycemia, with long-term current use of insulin (HCC)   ESRD on hemodialysis (HCC)   Anemia of chronic renal failure   Non-healing wound of right lower extremity   Chronic anticoagulation   Decubitus ulcer of heel, bilateral   Hypercoagulable state (positive anticardiolipin, elevated homocysteine, factor V Leiden deficiency)   Wound infection    MEDICATIONS:    Scheduled Meds:  apixaban  5 mg Oral Q12H   atorvastatin  80 mg Oral QHS   calcitRIOL  0.25 mcg Oral Daily  carvedilol  25 mg Oral BID   Chlorhexidine Gluconate Cloth  6 each Topical Daily   ferrous sulfate  325 mg Oral Daily   folic acid  1 mg Oral Daily   gabapentin  200 mg Oral BID   insulin aspart  0-20 Units Subcutaneous TID WC   insulin aspart  0-5 Units Subcutaneous QHS   insulin glargine-yfgn  10 Units Subcutaneous QHS   leptospermum manuka honey  1 Application Topical Daily   polyethylene glycol  34 g Oral Daily   torsemide  10 mg Oral Daily   Continuous Infusions:  anticoagulant sodium citrate     ceFEPime (MAXIPIME) IV     vancomycin Stopped  (06/14/22 1828)   PRN Meds:.acetaminophen **OR** acetaminophen, albuterol, alteplase, anticoagulant sodium citrate, Glycerin (Adult), heparin, HYDROcodone-acetaminophen, HYDROmorphone (DILAUDID) injection, lidocaine (PF), lidocaine-prilocaine, ondansetron **OR** ondansetron (ZOFRAN) IV, pentafluoroprop-tetrafluoroeth, traZODone  SUBJECTIVE:   24 hour events:  No new complaints S/p OR yesterday Afebrile Labs stable WBC increased following surgery No other events noted    Review of Systems  All other systems reviewed and are negative.     OBJECTIVE:   Blood pressure (!) 148/47, pulse 82, temperature 97.8 F (36.6 C), temperature source Oral, resp. rate 12, height 5\' 4"  (1.626 m), weight (!) 138.1 kg, SpO2 98 %. Body mass index is 52.26 kg/m.  Physical Exam Constitutional:      General: She is not in acute distress.    Appearance: Normal appearance. She is obese.  HENT:     Head: Normocephalic and atraumatic.  Eyes:     Extraocular Movements: Extraocular movements intact.     Conjunctiva/sclera: Conjunctivae normal.  Cardiovascular:     Comments: Right chest wall TDC in place Abdominal:     General: There is no distension.     Palpations: Abdomen is soft.  Musculoskeletal:     Cervical back: Normal range of motion and neck supple.  Neurological:     General: No focal deficit present.     Mental Status: She is alert and oriented to person, place, and time.      Lab Results: Lab Results  Component Value Date   WBC 11.1 (H) 06/16/2022   HGB 8.4 (L) 06/16/2022   HCT 27.7 (L) 06/16/2022   MCV 93.0 06/16/2022   PLT 224 06/16/2022    Lab Results  Component Value Date   NA 136 06/16/2022   K 4.9 06/16/2022   CO2 25 06/16/2022   GLUCOSE 247 (H) 06/16/2022   BUN 38 (H) 06/16/2022   CREATININE 4.79 (H) 06/16/2022   CALCIUM 8.0 (L) 06/16/2022   GFRNONAA 9 (L) 06/16/2022    Lab Results  Component Value Date   ALT 8 05/11/2022   AST 14 (L) 05/11/2022    ALKPHOS 76 05/11/2022   BILITOT 0.7 05/11/2022       Component Value Date/Time   CRP 22.0 (H) 06/13/2022 1026       Component Value Date/Time   ESRSEDRATE 128 (H) 06/13/2022 0803     I have reviewed the micro and lab results in Epic.  Imaging: No results found.   Imaging independently reviewed in Epic.    Raynelle Highland for Infectious Disease [facility] [phone] pager 06/16/2022, 12:09 PM

## 2022-06-16 NOTE — Evaluation (Signed)
Occupational Therapy Evaluation Patient Details Name: Jody Nelson MRN: [id number] DOB: 1951/03/09 Today's Date: 06/16/2022   History of Present Illness presented to ER secondary to pain, redness to R LE; admitted for management of cellulitis with chronic, non-healing wound to R LE (history of R tib plateau fracture with hardware/chronic wound infection), s/p I&D (06/15/22)   Clinical Impression   [name] was seen for OT evaluation this date. Prior to hospital admission, pt was at [facility], reports transfering to power w/c until last week when she required hoyer lift. Pt presents to acute OT demonstrating impaired ADL performance and functional mobility 2/2 decreased activity tolerance and functional strength/ROM/balance deficits. Pt currently requires MAX A don B socks bed level. MOD A x2 standing toileting, third assist for pericare standing. Pt would benefit from skilled OT to address noted impairments and functional limitations (see below for any additional details). Upon hospital discharge, recommend STR to maximize pt safety and return to PLOF.    Recommendations for follow up therapy are one component of a multi-disciplinary discharge planning process, led by the attending physician.  Recommendations may be updated based on patient status, additional functional criteria and insurance authorization.   Follow Up Recommendations  Skilled nursing-short term rehab (<3 hours/day)     Assistance Recommended at Discharge Frequent or constant Supervision/Assistance  Patient can return home with the following Two people to help with walking and/or transfers;Two people to help with bathing/dressing/bathroom    Functional Status Assessment  Patient has had a recent decline in their functional status and demonstrates the ability to make significant improvements in function in a reasonable and predictable amount of time.  Equipment Recommendations  Hospital bed    Recommendations for Other  Services       Precautions / Restrictions Precautions Precautions: Fall Restrictions Weight Bearing Restrictions: No      Mobility Bed Mobility Overal bed mobility: Needs Assistance Bed Mobility: Supine to Sit     Supine to sit: Mod assist, +2 for physical assistance          Transfers Overall transfer level: Needs assistance   Transfers: Sit to/from Stand Sit to Stand: Mod assist, +2 physical assistance                  Balance Overall balance assessment: Needs assistance Sitting-balance support: No upper extremity supported, Feet supported Sitting balance-Leahy Scale: Good     Standing balance support: Bilateral upper extremity supported Standing balance-Leahy Scale: Poor                             ADL either performed or assessed with clinical judgement   ADL Overall ADL's : Needs assistance/impaired                                       General ADL Comments: SETUP self-feeding bed level. MAX A don B socks bed level. MOD A x2 standing toileting, third assist for pericare standing.      Pertinent Vitals/Pain Pain Assessment Pain Assessment: Faces Faces Pain Scale: Hurts even more Pain Location: R LE Pain Descriptors / Indicators: Aching, Guarding, Grimacing Pain Intervention(s): Limited activity within patient's tolerance, Repositioned     Hand Dominance Right   Extremity/Trunk Assessment Upper Extremity Assessment Upper Extremity Assessment: Overall WFL for tasks assessed   Lower Extremity Assessment Lower Extremity Assessment: Generalized weakness  Cervical / Trunk Assessment Cervical / Trunk Assessment:  (R lateral trunk lean)   Communication Communication Communication: HOH   Cognition Arousal/Alertness: Awake/alert Behavior During Therapy: WFL for tasks assessed/performed Overall Cognitive Status: Within Functional Limits for tasks assessed                                         Home Living Family/patient expects to be discharged to:: Skilled nursing facility                                 Additional Comments: LTC resident at [facility]      Prior Functioning/Environment Prior Level of Function : Needs assist             Mobility Comments: WC (power) level as primary mobility, SPT between seating surfaces; endorses recent use of hoyer lift due to R LE pain/weakness ADLs Comments: Endorses sponge bath edge of bed, SPT to/from commode for toileting needs.        OT Problem List: Decreased strength;Decreased range of motion;Decreased activity tolerance;Impaired balance (sitting and/or standing);Decreased safety awareness      OT Treatment/Interventions: Self-care/ADL training;Therapeutic exercise;Energy conservation;DME and/or AE instruction;Therapeutic activities;Patient/family education;Balance training    OT Goals(Current goals can be found in the care plan section) Acute Rehab OT Goals Patient Stated Goal: go home OT Goal Formulation: With patient Time For Goal Achievement: 06/30/22 Potential to Achieve Goals: Good ADL Goals Pt Will Perform Grooming: with set-up;with supervision;sitting Pt Will Transfer to Toilet: with min assist;stand pivot transfer;bedside commode  OT Frequency: Min 2X/week    Co-evaluation PT/OT/SLP Co-Evaluation/Treatment: Yes Reason for Co-Treatment: Complexity of the patient's impairments (multi-system involvement);For patient/therapist safety;To address functional/ADL transfers PT goals addressed during session: Mobility/safety with mobility OT goals addressed during session: ADL's and self-care      AM-PAC OT "6 Clicks" Daily Activity     Outcome Measure Help from another person eating meals?: None Help from another person taking care of personal grooming?: A Little Help from another person toileting, which includes using toliet, bedpan, or urinal?: Total Help from another person bathing  (including washing, rinsing, drying)?: A Lot Help from another person to put on and taking off regular upper body clothing?: A Lot Help from another person to put on and taking off regular lower body clothing?: Total 6 Click Score: 13   End of Session    Activity Tolerance: Patient tolerated treatment well Patient left: in bed;with call bell/phone within reach;with nursing/sitter in room  OT Visit Diagnosis: Unsteadiness on feet (R26.81);Muscle weakness (generalized) (M62.81)                Time: 2876-8115 OT Time Calculation (min): 35 min Charges:  OT General Charges $OT Visit: 1 Visit OT Evaluation $OT Eval Moderate Complexity: 1 Mod OT Treatments $Self Care/Home Management : 8-22 mins  Dessie Coma, M.S. OTR/L  06/16/22, 12:47 PM  ascom [phone]

## 2022-06-16 NOTE — Hospital Course (Signed)
Jody Nelson is a 72 y.o. 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  Patient initially was placed on vancomycin and meropenem after seen by ID.  Patient also had a debridement performed 02/06, a wound VAC was placed.  Antibiotic switched to cefepime and vancomycin today by ID, which she can be continued through dialysis.

## 2022-06-16 NOTE — Evaluation (Signed)
Physical Therapy Evaluation Patient Details Name: Jody Nelson MRN: [id number] DOB: 20-Jul-1950 Today's Date: 06/16/2022  History of Present Illness  presented to ER secondary to pain, redness to R LE; admitted for management of cellulitis with chronic, non-healing wound to R LE (history of R tib plateau fracture with hardware/chronic wound infection), s/p I&D (06/15/22)  Clinical Impression  Patient resting in bed upon arrival to room; alert and oriented to basic information, follows commands and agreeable to participation with session.  Intermittent inconsistencies (confusion?) with more complex information/recall; will verify details of social history/PLOF with family/facility as appropriate. Baseline weakness to R hemi-body noted with functional activities (at least 3-/5), but functional for basic mobility needs during session.  Does require act assist effort for all movement of R LE, largely due to pain.  Currently requiring mod/max assist +2 for bed mobility; close sup for unsupported sitting balance; mod/max assist +2 for sit/stand, static standing balance.  Requires assist for R LE placement (knee flexion) as tolerated, limited by pain. Progressive R lateral trunk lean/weight shift with fatigue. Unable/unsafe to attempting stepping/pivot this date.  Will continue to assess/progress as able.  Recommend use of lift for transfers outside of therapy at this time. Would benefit from skilled PT to address above deficits and promote optimal return to PLOF.; recommend transition to STR upon discharge from acute hospitalization.      Recommendations for follow up therapy are one component of a multi-disciplinary discharge planning process, led by the attending physician.  Recommendations may be updated based on patient status, additional functional criteria and insurance authorization.  Follow Up Recommendations Skilled nursing-short term rehab (<3 hours/day) Can patient physically be transported by  private vehicle: No    Assistance Recommended at Discharge Frequent or constant Supervision/Assistance  Patient can return home with the following  Two people to help with walking and/or transfers;Two people to help with bathing/dressing/bathroom    Equipment Recommendations    Recommendations for Other Services       Functional Status Assessment Patient has had a recent decline in their functional status and demonstrates the ability to make significant improvements in function in a reasonable and predictable amount of time.     Precautions / Restrictions Precautions Precautions: Fall Restrictions Weight Bearing Restrictions: No      Mobility  Bed Mobility Overal bed mobility: Needs Assistance Bed Mobility: Supine to Sit     Supine to sit: Mod assist, +2 for physical assistance     General bed mobility comments: extensive assist for LE management, truncal elevation    Transfers Overall transfer level: Needs assistance   Transfers: Sit to/from Stand Sit to Stand: Mod assist, +2 physical assistance           General transfer comment: requires use of UEs for lift off and stabilization; 3-point, step to gait pattern between seating surfaces. Very slow and effortful; poor dynamic standing balance. Generally anxious with transfer; +1 for safety at all times    Ambulation/Gait               General Gait Details: non-ambulatory at baseline  Stairs            Wheelchair Mobility    Modified Rankin (Stroke Patients Only)       Balance Overall balance assessment: Needs assistance Sitting-balance support: No upper extremity supported, Feet supported Sitting balance-Leahy Scale: Good     Standing balance support: Bilateral upper extremity supported Standing balance-Leahy Scale: Poor  Pertinent Vitals/Pain Pain Assessment Pain Assessment: Faces Faces Pain Scale: Hurts even more Pain Location: R LE Pain  Descriptors / Indicators: Aching, Guarding, Grimacing Pain Intervention(s): Limited activity within patient's tolerance, Monitored during session, Repositioned    Home Living Family/patient expects to be discharged to:: Skilled nursing facility                   Additional Comments: LTC resident at [facility]    Prior Function Prior Level of Function : Needs assist             Mobility Comments: WC (power) level as primary mobility, SPT between seating surfaces; endorses recent use of hoyer lift due to R LE pain/weakness ADLs Comments: Endorses sponge bath edge of bed, SPT to/from commode for toileting needs.     Hand Dominance   Dominant Hand: Right    Extremity/Trunk Assessment   Upper Extremity Assessment Upper Extremity Assessment: Generalized weakness (R UE grossly at least 4-/5 throughout, L UE grossly at least 4/5 throughout)    Lower Extremity Assessment Lower Extremity Assessment:  (R LE grossly 3-/5 throughout (wound vac intact), L LE grossly 4/5 throughout)    Cervical / Trunk Assessment Cervical / Trunk Assessment:  (R lateral trunk lean)  Communication   Communication: HOH  Cognition Arousal/Alertness: Awake/alert Behavior During Therapy: WFL for tasks assessed/performed Overall Cognitive Status: Within Functional Limits for tasks assessed                                 General Comments: Oriented to basic information, follows commands, pleasant and cooperative; somewhat inconsistent with more complex details/recall of care needs, functional ability (will verify with family/facility as available)        General Comments      Exercises Other Exercises Other Exercises: Sit/stand x5 with bilat HHA, mod assist for lift off, standing balance; third person to assist with hygiene after incontinent BM with movement efforts   Assessment/Plan    PT Assessment Patient needs continued PT services  PT Problem List Decreased  strength;Decreased range of motion;Decreased safety awareness;Decreased balance;Decreased activity tolerance;Decreased mobility;Decreased coordination;Decreased cognition;Decreased knowledge of use of DME;Decreased knowledge of precautions;Pain;Obesity;Decreased skin integrity       PT Treatment Interventions DME instruction;Functional mobility training;Therapeutic activities;Therapeutic exercise;Balance training;Cognitive remediation;Patient/family education    PT Goals (Current goals can be found in the Care Plan section)  Acute Rehab PT Goals Patient Stated Goal: to get OOB to chair PT Goal Formulation: With patient Time For Goal Achievement: 06/30/22 Potential to Achieve Goals: Fair    Frequency Min 2X/week     Co-evaluation PT/OT/SLP Co-Evaluation/Treatment: Yes Reason for Co-Treatment: Complexity of the patient's impairments (multi-system involvement);To address functional/ADL transfers PT goals addressed during session: Mobility/safety with mobility OT goals addressed during session: ADL's and self-care       AM-PAC PT "6 Clicks" Mobility  Outcome Measure Help needed turning from your back to your side while in a flat bed without using bedrails?: A Lot Help needed moving from lying on your back to sitting on the side of a flat bed without using bedrails?: A Lot Help needed moving to and from a bed to a chair (including a wheelchair)?: A Lot Help needed standing up from a chair using your arms (e.g., wheelchair or bedside chair)?: A Lot Help needed to walk in hospital room?: Total Help needed climbing 3-5 steps with a railing? : Total 6 Click Score: 10  End of Session   Activity Tolerance: Patient tolerated treatment well;Patient limited by pain Patient left: in bed;with call bell/phone within reach;with bed alarm set Nurse Communication: Mobility status PT Visit Diagnosis: Muscle weakness (generalized) (M62.81);Difficulty in walking, not elsewhere classified  (R26.2);Pain Pain - Right/Left: Right Pain - part of body: Leg    Time: 7356-7014 PT Time Calculation (min) (ACUTE ONLY): 32 min   Charges:   PT Evaluation $PT Eval Moderate Complexity: 1 Mod          Bradrick Kamau H. Owens Shark, PT, DPT, NCS 06/16/22, 11:33 AM [phone]

## 2022-06-16 NOTE — Plan of Care (Signed)
  Problem: Education: Goal: Knowledge of General Education information will improve Description: Including pain rating scale, medication(s)/side effects and non-pharmacologic comfort measures Outcome: Progressing   Problem: Health Behavior/Discharge Planning: Goal: Ability to manage health-related needs will improve Outcome: Progressing   Problem: Clinical Measurements: Goal: Diagnostic test results will improve Outcome: Progressing Goal: Cardiovascular complication will be avoided Outcome: Progressing   Problem: Nutrition: Goal: Adequate nutrition will be maintained Outcome: Progressing   Problem: Elimination: Goal: Will not experience complications related to bowel motility Outcome: Progressing   Problem: Pain Managment: Goal: General experience of comfort will improve Outcome: Progressing

## 2022-06-16 NOTE — Progress Notes (Addendum)
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  Patient presents to the emergency department with increased swelling and pain from right lower extremity.  She has been admitted for Wound infection [T14.8XXA, L08.9] Osteomyelitis of right leg [facility]) [M86.9]  Patient is known to our practice and receives outpatient dialysis treatments at [facility] on a Monday Wednesday Friday schedule, supervised by Dr. Candiss Norse.    Patient seen laying in bed Alert and oriented Denies pain from left foot wounds Tolerating small meals Room air Patient seen later with attending sitting at side of bed with therapy   Objective:  Vital signs in last 24 hours:  Temp:  [97 F (36.1 C)-98.1 F (36.7 C)] 97.8 F (36.6 C) (02/07 1155) Pulse Rate:  [73-85] 79 (02/07 1330) Resp:  [11-20] 15 (02/07 1330) BP: (110-148)/(47-110) 138/110 (02/07 1330) SpO2:  [93 %-100 %] 100 % (02/07 1330) Weight:  [138.1 kg] 138.1 kg (02/07 1205)  Weight change:  Filed Weights   06/12/22 1710 06/14/22 1845 06/16/22 1205  Weight: 135.6 kg (!) 139.9 kg (!) 138.1 kg    Intake/Output: I/O last 3 completed shifts: In: 836.7 [I.V.:300; IV Piggyback:536.7] Out: 75 [Drains:50; Blood:25]   Intake/Output this shift:  No intake/output data recorded.  Physical Exam: General: NAD  Head: Normocephalic, atraumatic. Moist oral mucosal membranes  Eyes: Anicteric  Lungs:  Clear to auscultation, normal effort, room air  Heart: Regular rate and rhythm  Abdomen:  Soft, nontender, obese  Extremities: No peripheral edema.  Neurologic: Alert and oriented, moving all four extremities  Skin: No lesions  Access: Left AVG, Rt permcath    Basic Metabolic Panel: Recent Labs  Lab  06/12/22 1936 06/13/22 0803 06/14/22 0257 06/15/22 0302 06/16/22 0153  NA 134* 136 135 135 136  K 4.5 4.4 4.9 3.8 4.9  CL 99 105 99 98 98  CO2 22 23 23 28 25   GLUCOSE 276* 281* 122* 152* 247*  BUN 33* 37* 45* 27* 38*  CREATININE 5.46* 5.17* 6.28* 3.96* 4.79*  CALCIUM 8.4* 7.0* 8.0* 7.6* 8.0*  PHOS  --   --  4.6  --   --      Liver Function Tests: No results for input(s): "AST", "ALT", "ALKPHOS", "BILITOT", "PROT", "ALBUMIN" in the last 168 hours. No results for input(s): "LIPASE", "AMYLASE" in the last 168 hours. No results for input(s): "AMMONIA" in the last 168 hours.  CBC: Recent Labs  Lab 06/12/22 1936 06/13/22 0803 06/14/22 0257 06/15/22 0302 06/16/22 0153  WBC 16.4* 10.9* 9.8 7.9 11.1*  NEUTROABS 13.0*  --   --   --   --   HGB 9.4* 8.0* 7.9* 8.1* 8.4*  HCT 32.0* 26.3* 26.4* 27.0* 27.7*  MCV 97.0 95.3 94.6 93.1 93.0  PLT 164 146* 186 214 224     Cardiac Enzymes: No results for input(s): "CKTOTAL", "CKMB", "CKMBINDEX", "TROPONINI" in the last 168 hours.  BNP: Invalid input(s): "POCBNP"  CBG: Recent Labs  Lab 06/15/22 1522 06/15/22 1714 06/15/22 2132 06/16/22 0756 06/16/22 1130  GLUCAP 87 147* 172* 223* 67*     Microbiology: Results for orders placed or performed during the hospital encounter of 06/12/22  MRSA Next Gen by PCR, Nasal     Status: None   Collection Time: 06/13/22 12:45 AM   Specimen: Nasal Mucosa; Nasal  Swab  Result Value Ref Range Status   MRSA by PCR Next Gen NOT DETECTED NOT DETECTED Final    Comment: (NOTE) The GeneXpert MRSA Assay (FDA approved for NASAL specimens only), is one component of a comprehensive MRSA colonization surveillance program. It is not intended to diagnose MRSA infection nor to guide or monitor treatment for MRSA infections. Test performance is not FDA approved in patients less than 55 years old. Performed at [facility], [address] [place], [place] [address]   Culture, blood (Routine  X 2) w Reflex to ID Panel     Status: None (Preliminary result)   Collection Time: 06/13/22  9:34 AM   Specimen: BLOOD  Result Value Ref Range Status   Specimen Description BLOOD BLOOD LEFT HAND  Final   Special Requests   Final    BOTTLES DRAWN AEROBIC AND ANAEROBIC Blood Culture adequate volume   Culture   Final    NO GROWTH 3 DAYS Performed at [facility], [address] [place], [place] [address]    Report Status PENDING  Incomplete  Aerobic Culture w Gram Stain (superficial specimen)     Status: None (Preliminary result)   Collection Time: 06/13/22  9:35 AM   Specimen: Abscess  Result Value Ref Range Status   Specimen Description   Final    ABSCESS S T Performed at [facility], [address], [place], [place] [address]    Special Requests   Final    NONE Performed at [facility], [address], [place], [place] [address]    Gram Stain   Final    FEW GRAM POSITIVE COCCI IN SINGLES IN PAIRS RARE GRAM VARIABLE ROD NO WBC SEEN    Culture   Final    FEW MORGANELLA MORGANII FEW ESCHERICHIA COLI FEW STAPHYLOCOCCUS AUREUS REPEATING SUSCEPTIBILITY Performed at [facility], [place] [address], [place],  [address]    Report Status PENDING  Incomplete   Organism ID, Bacteria MORGANELLA MORGANII  Final   Organism ID, Bacteria ESCHERICHIA COLI  Final      Susceptibility   Escherichia coli - MIC*    AMPICILLIN >=32 RESISTANT Resistant     CEFAZOLIN <=4 SENSITIVE Sensitive     CEFEPIME <=0.12 SENSITIVE Sensitive     CEFTAZIDIME <=1 SENSITIVE Sensitive     CEFTRIAXONE <=0.25 SENSITIVE Sensitive     CIPROFLOXACIN >=4 RESISTANT Resistant     GENTAMICIN <=1 SENSITIVE Sensitive     IMIPENEM <=0.25 SENSITIVE Sensitive     TRIMETH/SULFA >=320 RESISTANT Resistant     AMPICILLIN/SULBACTAM 16 INTERMEDIATE Intermediate     PIP/TAZO <=4 SENSITIVE Sensitive     * FEW ESCHERICHIA COLI   Morganella morganii - MIC*    AMPICILLIN >=32 RESISTANT  Resistant     CEFAZOLIN RESISTANT Resistant     CEFTAZIDIME <=1 SENSITIVE Sensitive     CIPROFLOXACIN >=4 RESISTANT Resistant     GENTAMICIN <=1 SENSITIVE Sensitive     IMIPENEM 0.5 SENSITIVE Sensitive     TRIMETH/SULFA >=320 RESISTANT Resistant     AMPICILLIN/SULBACTAM 16 INTERMEDIATE Intermediate     PIP/TAZO <=4 SENSITIVE Sensitive     * FEW MORGANELLA MORGANII  Culture, blood (Routine X 2) w Reflex to ID Panel     Status: None (Preliminary result)   Collection Time: 06/13/22  2:23 PM   Specimen: BLOOD  Result Value Ref Range Status   Specimen Description BLOOD BLOOD RIGHT FOREARM  Final   Special Requests  Final    BOTTLES DRAWN AEROBIC AND ANAEROBIC Blood Culture adequate volume   Culture   Final    NO GROWTH 3 DAYS Performed at [facility], [address] [place], [place] [address]    Report Status PENDING  Incomplete  Aerobic/Anaerobic Culture w Gram Stain (surgical/deep wound)     Status: None (Preliminary result)   Collection Time: 06/15/22  2:37 PM   Specimen: Leg, Right; Wound  Result Value Ref Range Status   Specimen Description   Final    WOUND Performed at [facility], [address], [place], [place] [address]    Special Requests   Final    RIGHT LEG Performed at [facility], [address] [place], [place] [address]    Gram Stain   Final    FEW WBC PRESENT,BOTH PMN AND MONONUCLEAR RARE GRAM POSITIVE COCCI IN PAIRS    Culture   Final    NO GROWTH < 12 HOURS Performed at [facility], [place] [address], [place],  [address]    Report Status PENDING  Incomplete    Coagulation Studies: No results for input(s): "LABPROT", "INR" in the last 72 hours.  Urinalysis: No results for input(s): "COLORURINE", "LABSPEC", "PHURINE", "GLUCOSEU", "HGBUR", "BILIRUBINUR", "KETONESUR", "PROTEINUR", "UROBILINOGEN", "NITRITE", "LEUKOCYTESUR" in the last 72 hours.  Invalid input(s): "APPERANCEUR"    Imaging: No results  found.   Medications:    anticoagulant sodium citrate     ceFEPime (MAXIPIME) IV     vancomycin Stopped (06/14/22 1828)    apixaban  5 mg Oral Q12H   atorvastatin  80 mg Oral QHS   calcitRIOL  0.25 mcg Oral Daily   carvedilol  25 mg Oral BID   Chlorhexidine Gluconate Cloth  6 each Topical Daily   ferrous sulfate  325 mg Oral Daily   folic acid  1 mg Oral Daily   gabapentin  200 mg Oral BID   insulin aspart  0-20 Units Subcutaneous TID WC   insulin aspart  0-5 Units Subcutaneous QHS   insulin glargine-yfgn  10 Units Subcutaneous QHS   leptospermum manuka honey  1 Application Topical Daily   polyethylene glycol  34 g Oral Daily   torsemide  10 mg Oral Daily   acetaminophen **OR** acetaminophen, albuterol, alteplase, anticoagulant sodium citrate, Glycerin (Adult), heparin, HYDROcodone-acetaminophen, HYDROmorphone (DILAUDID) injection, lidocaine (PF), lidocaine-prilocaine, ondansetron **OR** ondansetron (ZOFRAN) IV, pentafluoroprop-tetrafluoroeth, traZODone  Assessment/ Plan:  Ms. Tarry Blayney is a 72 y.o.  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  Patient presents to the emergency department with increased swelling and pain from right lower extremity.  She has been admitted for Wound infection [T14.8XXA, L08.9] Osteomyelitis of right leg (HCC) [M86.9]   End-stage renal disease on hemodialysis. Scheduled to receive dialysis later today. UF 0-0.5L as tolerated.   2. Anemia of chronic kidney disease Lab Results  Component Value Date   HGB 8.4 (L) 06/16/2022    Patient receives Mircera at outpatient clinic. Hgb below goal. Will monitor for now and evaluate need for ESA's during this admission.   3. Secondary Hyperparathyroidism: with outpatient labs: PTH 463, phosphorus 6.8, calcium 8.4 on 05/17/22.    Lab Results   Component Value Date   CALCIUM 8.0 (L) 06/16/2022   CAION 1.13 (L) 04/16/2022   PHOS 4.6 06/14/2022    Currently prescribed calcitriol, cholecalciferol, and calcium acetate outpatient. Continue  calcitriol daily.  4. Diabetes mellitus type II with chronic kidney disease/renal manifestations: insulin dependent. Home regimen includes Lantus.   Glucose elevated today, primary team will manage SSI  5. Cellulitis and abscess of right leg, Imaging negative for osteomyelitis. I&D competed on 06/15/22 with NPWV placed. Remains on Cefepime and Vanc given with dialysis.      LOS: [place] 2/7/20242:20 PM

## 2022-06-16 NOTE — Progress Notes (Signed)
PT did 2 hrs of HD, needle infiltrated. Pt was eating and talking on the phone using the cannulated arm causing the venus needle to infiltrate. PT was told numerous times to try to keep the arm still. NP notified.  UF = 200 ml    06/16/22 1436  Vitals  BP (!) 112/48  MAP (mmHg) 68  BP Location Right Arm  BP Method Automatic  Patient Position (if appropriate) Lying  Pulse Rate 81  Pulse Rate Source Monitor  ECG Heart Rate 81  Resp 13  Oxygen Therapy  SpO2 99 %  O2 Device Room Air  Patient Activity (if Appropriate) In bed  Pulse Oximetry Type Continuous  During Treatment Monitoring  Intra-Hemodialysis Comments Tx completed;See progress note (PT access infiltrated due to pt moving. Ending Tx, NP notified.)  Post Treatment  Dialyzer Clearance Lightly streaked  Duration of HD Treatment -hour(s) 2 hour(s)  Hemodialysis Intake (mL) 0 mL  Liters Processed 42.5  Fluid Removed (mL) 200 mL  Tolerated HD Treatment Yes  Post-Hemodialysis Comments Venus needle inffiltrated. PT was told numerous times to avoid moving the cannulated arm  AVG/AVF Arterial Site Held (minutes) 10 minutes  AVG/AVF Venous Site Held (minutes) 10 minutes  Fistula / Graft Left Upper arm Arteriovenous vein graft  Placement Date/Time: 04/16/22 1044   Placed prior to admission: No  Orientation: Left  Access Location: Upper arm  Access Type: Arteriovenous vein graft  Site Condition Red/inflamed;Painful  Fistula / Graft Assessment Thrill;Bruit;Present  Status Deaccessed  Drainage Description None

## 2022-06-17 ENCOUNTER — Ambulatory Visit
Admission: RE | Admit: 2022-06-17 | Discharge: 2022-06-17 | Disposition: A | Discharge status: Home / Self Care | Payer: 59 | Source: Ambulatory Visit | Attending: Family Medicine | Admitting: Family Medicine

## 2022-06-17 DIAGNOSIS — N189 Chronic kidney disease, unspecified: Secondary | ICD-10-CM | POA: Diagnosis not present

## 2022-06-17 DIAGNOSIS — L03115 Cellulitis of right lower limb: Secondary | ICD-10-CM | POA: Diagnosis not present

## 2022-06-17 DIAGNOSIS — I890 Lymphedema, not elsewhere classified: Secondary | ICD-10-CM | POA: Diagnosis not present

## 2022-06-17 DIAGNOSIS — L02415 Cutaneous abscess of right lower limb: Secondary | ICD-10-CM | POA: Diagnosis not present

## 2022-06-17 DIAGNOSIS — Z8673 Personal history of transient ischemic attack (TIA), and cerebral infarction without residual deficits: Secondary | ICD-10-CM | POA: Diagnosis not present

## 2022-06-17 DIAGNOSIS — D631 Anemia in chronic kidney disease: Secondary | ICD-10-CM

## 2022-06-17 DIAGNOSIS — S81809A Unspecified open wound, unspecified lower leg, initial encounter: Secondary | ICD-10-CM

## 2022-06-17 DIAGNOSIS — N186 End stage renal disease: Secondary | ICD-10-CM | POA: Diagnosis not present

## 2022-06-17 LAB — CBC
HCT: 22.4 % — ABNORMAL LOW (ref 36.0–46.0)
Hemoglobin: 6.8 g/dL — ABNORMAL LOW (ref 12.0–15.0)
MCH: 28.3 pg (ref 26.0–34.0)
MCHC: 30.4 g/dL (ref 30.0–36.0)
MCV: 93.3 fL (ref 80.0–100.0)
Platelets: 241 10*3/uL (ref 150–400)
RBC: 2.4 MIL/uL — ABNORMAL LOW (ref 3.87–5.11)
RDW: 15.2 % (ref 11.5–15.5)
WBC: 9.4 10*3/uL (ref 4.0–10.5)
nRBC: 0 % (ref 0.0–0.2)

## 2022-06-17 LAB — GLUCOSE, CAPILLARY
Glucose-Capillary: 285 mg/dL — ABNORMAL HIGH (ref 70–99)
Glucose-Capillary: 243 mg/dL — ABNORMAL HIGH (ref 70–99)
Glucose-Capillary: 220 mg/dL — ABNORMAL HIGH (ref 70–99)
Glucose-Capillary: 183 mg/dL — ABNORMAL HIGH (ref 70–99)

## 2022-06-17 LAB — HEMOGLOBIN AND HEMATOCRIT, BLOOD
HCT: 27 % — ABNORMAL LOW (ref 36.0–46.0)
Hemoglobin: 8.3 g/dL — ABNORMAL LOW (ref 12.0–15.0)

## 2022-06-17 LAB — BASIC METABOLIC PANEL
Anion gap: 8 (ref 5–15)
BUN: 34 mg/dL — ABNORMAL HIGH (ref 8–23)
CO2: 28 mmol/L (ref 22–32)
Calcium: 7.7 mg/dL — ABNORMAL LOW (ref 8.9–10.3)
Chloride: 97 mmol/L — ABNORMAL LOW (ref 98–111)
Creatinine, Ser: 4.1 mg/dL — ABNORMAL HIGH (ref 0.44–1.00)
GFR, Estimated: 11 mL/min — ABNORMAL LOW (ref >60)
Glucose, Bld: 270 mg/dL — ABNORMAL HIGH (ref 70–99)
Potassium: 3.5 mmol/L (ref 3.5–5.1)
Sodium: 133 mmol/L — ABNORMAL LOW (ref 135–145)

## 2022-06-17 LAB — SURGICAL PATHOLOGY

## 2022-06-17 LAB — AEROBIC CULTURE W GRAM STAIN (SUPERFICIAL SPECIMEN)

## 2022-06-17 LAB — ABO/RH: ABO/RH(D): A POS

## 2022-06-17 LAB — PREPARE RBC (CROSSMATCH)

## 2022-06-17 MED ORDER — INSULIN ASPART 100 UNIT/ML IJ SOLN
2.0000 [IU] | Freq: Three times a day (TID) | INTRAMUSCULAR | Status: DC
  Administered 2022-06-17 – 2022-06-21 (×11): 2 [IU] via SUBCUTANEOUS
  Filled 2022-06-17 (×11): qty 1

## 2022-06-17 MED ORDER — INSULIN GLARGINE-YFGN 100 UNIT/ML ~~LOC~~ SOLN
15.0000 [IU] | Freq: Every day | SUBCUTANEOUS | Status: DC
  Administered 2022-06-17 – 2022-06-20 (×4): 15 [IU] via SUBCUTANEOUS
  Filled 2022-06-17 (×6): qty 0.15

## 2022-06-17 MED ORDER — LACTULOSE 10 GM/15ML PO SOLN
20.0000 g | Freq: Once | ORAL | Status: AC
  Administered 2022-06-17: 20 g via ORAL
  Filled 2022-06-17: qty 30

## 2022-06-17 MED ORDER — INSULIN ASPART 100 UNIT/ML IJ SOLN
0.0000 [IU] | Freq: Three times a day (TID) | INTRAMUSCULAR | Status: DC
  Administered 2022-06-17 (×2): 3 [IU] via SUBCUTANEOUS
  Administered 2022-06-18: 2 [IU] via SUBCUTANEOUS
  Administered 2022-06-19: 1 [IU] via SUBCUTANEOUS
  Administered 2022-06-19: 2 [IU] via SUBCUTANEOUS
  Administered 2022-06-19 – 2022-06-20 (×2): 1 [IU] via SUBCUTANEOUS
  Administered 2022-06-20: 2 [IU] via SUBCUTANEOUS
  Administered 2022-06-20: 1 [IU] via SUBCUTANEOUS
  Administered 2022-06-21: 2 [IU] via SUBCUTANEOUS
  Administered 2022-06-21: 1 [IU] via SUBCUTANEOUS
  Filled 2022-06-17 (×11): qty 1

## 2022-06-17 MED ORDER — SODIUM CHLORIDE 0.9% IV SOLUTION: Freq: Once | INTRAVENOUS | Status: AC

## 2022-06-17 MED ORDER — OXYCODONE-ACETAMINOPHEN 5-325 MG PO TABS
1.0000 | ORAL_TABLET | Freq: Four times a day (QID) | ORAL | Status: DC | PRN
  Administered 2022-06-20 – 2022-06-21 (×2): 1 via ORAL
  Filled 2022-06-17 (×2): qty 1

## 2022-06-17 NOTE — Progress Notes (Signed)
Inpatient Diabetes Program Recommendations  AACE/ADA: New Consensus Statement on Inpatient Glycemic Control (2015)  Target Ranges:  Prepandial:   less than 140 mg/dL      Peak postprandial:   less than 180 mg/dL (1-2 hours)      Critically ill patients:  140 - 180 mg/dL   Lab Results  Component Value Date   GLUCAP 285 (H) 06/17/2022   HGBA1C 8.7 (H) 06/13/2022    Review of Glycemic Control  Latest Reference Range & Units 06/15/22 21:32 06/16/22 07:56 06/16/22 11:30 06/16/22 16:49 06/16/22 21:27 06/17/22 07:58  Glucose-Capillary 70 - 99 mg/dL 172 (H) 223 (H) 257 (H) 231 (H) 304 (H) 285 (H)  Diabetes history: DM2 Outpatient Diabetes medications:  Lantus 10 units daily Humalog 2-12 units TID  Current orders for Inpatient glycemic control:  Novolog 0-20 units TID and 0-5 units QHS, Semglee 10 units daily   Inpatient Diabetes Program Recommendations:    May consider reducing Novolog correction to sensitive tid with meals (due to ESRD) and add Novolog 2 units tid with meals (hold if patient eats less than 50% or NPO).  Also consider increasing Semglee to 15 units daily.  Thanks,  Adah Perl, RN, BC-ADM Inpatient Diabetes Coordinator Pager [phone]  (8a-5p)

## 2022-06-17 NOTE — Consult Note (Signed)
Pharmacy Antibiotic Note  Jody Nelson is a 72 y.o. female admitted on 06/12/2022 with cellulitis and nonhealing wound of R leg. 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 Last HD session PTA on Saturday 2/3 (did not complete). No evidence of OM on imaging. 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 She completed 6 weeks fo vancomycin and ceftriaxone IV plus PO metronidazole followed by PO doxycyline and cefadroxil. 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 For this reason, Zosyn was changed to ertapenem on 2/5. Ertapenem not given 2/5, so it was re-scheduled to start 2/6. Patient underwent I&D on 2/6, cultures sent. Pharmacy has been consulted for vancomycin dosing.  Plan: Day 5 of antibiotics Continue vancomycin 1000 mg IV QHD (MWF). Goal trough 15-25 mcg/mL Plan to check vancomycin level prior to 4th maintenance dose (anticipate 06/20/22)  Patient is also on cefepime 1 g IV Q24H  Continue to monitor HD schedule and follow final culture results   Height: 5' 4"$  (162.6 cm) Weight: (!) 138.1 kg (304 lb 7.3 oz) IBW/kg (Calculated) : 54.7  Temp (24hrs), Avg:97.9 F (36.6 C), Min:97.8 F (36.6 C), Max:98.1 F (36.7 C)  Recent Labs  Lab 06/12/22 1936 06/13/22 0803 06/14/22 0257 06/15/22 0302 06/16/22 0153 06/17/22 0150  WBC 16.4* 10.9* 9.8 7.9 11.1* 9.4  CREATININE 5.46* 5.17* 6.28* 3.96* 4.79* 4.10*  LATICACIDVEN 2.2* 1.5  --   --   --   --      Estimated Creatinine Clearance: 18 mL/min (A) (by C-G formula based on SCr of 4.1 mg/dL (H)).    Allergies  Allergen Reactions   Sulfamethoxazole-Trimethoprim     Other reaction(s): Kidney Disorder Other reaction(s): Kidney Disorder Hyperkalemia, AKI Hyperkalemia, AKI     Antimicrobials this admission: 2/3 Zosyn >> 2/5 2/3 Vancomycin >>  2/6 Ertapenem  x1 2/7 Cefepime >>  Dose adjustments this admission: N/A  Microbiology results: 2/4 BCx: NG final 2/4 MRSA PCR: negative 2/4 Abscess cx: few Morganella morganii (R- ampicillin, cefazolin, ciprofloxacin, TMP/SMX), E. coli (R- ampicillin, ciprofloxacin, TMP/SMX), MSSA (R- ciprofloxacin, erythromycin, tetracycline, TMP/SMX) 2/6 RL Wound Cx: rare E. coli, rare Staph aureus, rare Strep agalactiae, no anaerobes  Thank you for allowing pharmacy to be a part of this patient's care.  Gretel Acre, PharmD PGY1 Pharmacy Resident 06/17/2022 3:17 PM

## 2022-06-17 NOTE — Progress Notes (Signed)
Occupational Therapy Treatment Patient Details Name: Jody Nelson MRN: [id number] DOB: 1950/08/01 Today's Date: 06/17/2022   History of present illness presented to ER secondary to pain, redness to R LE; admitted for management of cellulitis with chronic, non-healing wound to R LE (history of R tib plateau fracture with hardware/chronic wound infection), s/p I&D (06/15/22)   OT comments  Pt received semi-reclined in bed; receiving a unit of blood; OT checked with RN and pt cleared for working with therapy. Appearing tired, but willing to work with OT on transfers and ADLs. Improved performance today. See flowsheet below for further details of session. Left with LE elevated in chair with all needs in reach.     Recommendations for follow up therapy are one component of a multi-disciplinary discharge planning process, led by the attending physician.  Recommendations may be updated based on patient status, additional functional criteria and insurance authorization.    Follow Up Recommendations  Skilled nursing-short term rehab (<3 hours/day)     Assistance Recommended at Discharge Frequent or constant Supervision/Assistance  Patient can return home with the following  Two people to help with walking and/or transfers;Two people to help with bathing/dressing/bathroom   Equipment Recommendations  Hospital bed    Recommendations for Other Services      Precautions / Restrictions Precautions Precautions: Fall Restrictions Weight Bearing Restrictions: No       Mobility Bed Mobility Overal bed mobility: Needs Assistance Bed Mobility: Supine to Sit     Supine to sit: Mod assist, +2 for physical assistance     General bed mobility comments: difficulty with using LUE for pulling/rolling; assistance for LLE on bed    Transfers Overall transfer level: Needs assistance Equipment used: Rolling walker (2 wheels) Transfers: Sit to/from Stand, Bed to chair/wheelchair/BSC Sit to Stand: Min  assist, Mod assist, +2 safety/equipment, From elevated surface Stand pivot transfers: Min assist, Mod assist, +2 safety/equipment         General transfer comment: Pt tolerance for mobility increased; see PT note for details of LE management. Pt using BIL UE on RW well during t/f and standing.     Balance Overall balance assessment: Needs assistance Sitting-balance support: No upper extremity supported, Feet supported Sitting balance-Leahy Scale: Good     Standing balance support: Bilateral upper extremity supported Standing balance-Leahy Scale: Fair                             ADL either performed or assessed with clinical judgement   ADL Overall ADL's : Needs assistance/impaired     Grooming: Set up;Oral care;Sitting (and applying lotion to upper legs)                       Toileting- Clothing Manipulation and Hygiene: Total assistance;Sit to/from stand Toileting - Clothing Manipulation Details (indicate cue type and reason): standing with x2 CGA-MIN A at RW during dependent hygiene for bowel incontinence.            Extremity/Trunk Assessment Upper Extremity Assessment Upper Extremity Assessment: Generalized weakness   Lower Extremity Assessment Lower Extremity Assessment: Defer to PT evaluation        Vision       Perception     Praxis      Cognition Arousal/Alertness: Awake/alert Behavior During Therapy: WFL for tasks assessed/performed Overall Cognitive Status: Within Functional Limits for tasks assessed  General Comments: Increased processing speed; tired today; pleasant and cooperative; motivated        Exercises      Shoulder Instructions       General Comments Pt with increased pain during bowel hygiene 2/2 wound.    Pertinent Vitals/ Pain       Pain Assessment Pain Assessment: 0-10 Pain Score:  (unrated, moderate) Pain Location: L UE and buttocks (wound site) Pain  Descriptors / Indicators: Aching, Guarding, Grimacing Pain Intervention(s): Limited activity within patient's tolerance  Home Living                                          Prior Functioning/Environment              Frequency  Min 2X/week        Progress Toward Goals  OT Goals(current goals can now be found in the care plan section)  Progress towards OT goals: Progressing toward goals  Acute Rehab OT Goals Patient Stated Goal: Get better OT Goal Formulation: With patient Time For Goal Achievement: 06/30/22 Potential to Achieve Goals: Good ADL Goals Pt Will Perform Grooming: with set-up;with supervision;sitting Pt Will Transfer to Toilet: with min assist;stand pivot transfer;bedside commode  Plan Discharge plan remains appropriate    Co-evaluation    PT/OT/SLP Co-Evaluation/Treatment: Yes Reason for Co-Treatment: Complexity of the patient's impairments (multi-system involvement);For patient/therapist safety;To address functional/ADL transfers PT goals addressed during session: Mobility/safety with mobility OT goals addressed during session: ADL's and self-care      AM-PAC OT "6 Clicks" Daily Activity     Outcome Measure   Help from another person eating meals?: None Help from another person taking care of personal grooming?: None Help from another person toileting, which includes using toliet, bedpan, or urinal?: Total Help from another person bathing (including washing, rinsing, drying)?: A Lot Help from another person to put on and taking off regular upper body clothing?: A Lot Help from another person to put on and taking off regular lower body clothing?: Total 6 Click Score: 14    End of Session    OT Visit Diagnosis: Unsteadiness on feet (R26.81);Muscle weakness (generalized) (M62.81)   Activity Tolerance  Good   Patient Left  Semi-reclined in chair   Nurse Communication  Mobility status        Time: 1431-1520 OT Time  Calculation (min): 49 min  Charges: OT General Charges $OT Visit: 1 Visit OT Treatments $Self Care/Home Management : 8-22 mins $Therapeutic Activity: 8-22 mins  Waymon Amato, MS, OTR/L   Vania Rea 06/17/2022, 3:38 PM

## 2022-06-17 NOTE — Progress Notes (Signed)
Progress Note   Patient: Jody Nelson PJA:[id number] DOB: [birth year] DOA: 06/12/2022     5 DOS: the patient was seen and examined on 06/17/2022   Brief hospital course: Jody Nelson is a 72 y.o. 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  Patient initially was placed on vancomycin and meropenem after seen by ID.  Patient also had a debridement performed 02/06, a wound VAC was placed.  Antibiotic switched to cefepime and vancomycin today by ID, which she can be continued through dialysis.   Principal Problem:   Cellulitis and abscess of right leg Active Problems:   Non-healing wound of right lower extremity   Lymphedema of both lower extremities   Uncontrolled type 2 diabetes mellitus with hyperglycemia, with long-term current use of insulin (HCC)   ESRD on hemodialysis (HCC)   Chronic anticoagulation   Hypercoagulable state (positive anticardiolipin, elevated homocysteine, factor V Leiden deficiency)   (HFpEF) heart failure with preserved ejection fraction (HCC)   H/O: CVA (cerebrovascular accident)   History of obstructive sleep apnea   Essential hypertension   Morbid obesity with BMI of 50.0-59.9, adult (HCC)   Sarcoidosis   Anemia of chronic renal failure   Decubitus ulcer of heel, bilateral   Wound infection   Assessment and Plan:  * Cellulitis and abscess  of right leg Nonhealing wound right shin/possible osteomyelitis History of right tibial plateau fracture 2019 with hardware and chronic wound infection Chronic lymphedema Status post I&D with wound VAC.  Appreciate ID consult.  Antibiotic switched to cefepime and vancomycin.   Wound culture final result still pending, appears to be multifactorial including Staph aureus.  Uncontrolled type 2 diabetes mellitus with hyperglycemia, with long-term current use of insulin (HCC) Hemoglobin A1c 8.7, insulin dose adjusted.   ESRD on hemodialysis [facility]) Hemodialysis per nephrology.   Chronic anticoagulation Hypercoagulability 2020 (positive anticardiolipin, elevated homocysteine, factor V Leiden deficiency) h/o multiple cryptogenic CVAs Continue Eliquis.   Decubitus ulcer of heel, bilateral Wound care following.   Anemia of chronic renal failure Hemoglobin dropped down to 6.8, transfuse 1 unit PRBC.   Sarcoidosis Biopsy proven     Morbid obesity with BMI of 50.0-59.9, adult ([place]) Continue to follow, diet exercise advised.   Essential hypertension Continue home medicines.   History of obstructive sleep apnea CPAP nightly   H/O: CVA (cerebrovascular accident) PT will evaluate patient.   (HFpEF) heart failure with preserved ejection fraction (HCC) Condition stable.     Subjective:  Patient feels better today, still complaining of pain all over.  Denies any short of breath or cough.  Physical Exam: Vitals:   06/16/22 1525 06/16/22 2100 06/16/22 2317 06/17/22 0757  BP: (!) 106/44 (!) 131/50 (!) 140/47 (!) 110/41  Pulse: 82 80 73 79  Resp: 15  20 17   Temp: 97.8 F (36.6 C)  97.8 F (36.6 C) 98.8 F (37.1 C)  TempSrc: Oral     SpO2:  98%  100% 100%  Weight:      Height:       General exam: Appears calm and comfortable  Respiratory system: Clear to auscultation. Respiratory effort normal. Cardiovascular system: S1 & S2 heard, RRR. No JVD, murmurs, rubs, gallops or clicks.   Gastrointestinal system: Abdomen is nondistended, soft and nontender. No organomegaly or masses felt. Normal bowel sounds heard. Central nervous system: Alert and oriented. No focal neurological deficits. Extremities: Bilateral leg edema 2 to 3+ Skin: No rashes, lesions or ulcers Psychiatry: Judgement and insight appear normal. Mood & affect appropriate.    Data Reviewed:  Lab results reviewed  Family Communication: None  Disposition: Status is: Inpatient Remains inpatient appropriate because: Severity of disease, IV treatment.     Time spent: 35 minutes  Author: Sharen Hones, MD 06/17/2022 12:13 PM  For on call review [email or web address].

## 2022-06-17 NOTE — Progress Notes (Signed)
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  Patient presents to the emergency department with increased swelling and pain from right lower extremity.  She has been admitted for Wound infection [T14.8XXA, L08.9] Osteomyelitis of right leg [facility]) [M86.9]  Patient is known to our practice and receives outpatient dialysis treatments at [facility] on a Monday Wednesday Friday schedule, supervised by Dr. Candiss Norse.    Patient laying In bed Alert and oriented No family at bedside Complains of discomfort in right lower extremity Soreness in AV graft  Objective:  Vital signs in last 24 hours:  Temp:  [97.8 F (36.6 C)-98.8 F (37.1 C)] 98.8 F (37.1 C) (02/08 0757) Pulse Rate:  [73-85] 79 (02/08 0757) Resp:  [13-20] 17 (02/08 0757) BP: (93-140)/(41-110) 110/41 (02/08 0757) SpO2:  [98 %-100 %] 100 % (02/08 0757)  Weight change:  Filed Weights   06/12/22 1710 06/14/22 1845 06/16/22 1205  Weight: 135.6 kg (!) 139.9 kg (!) 138.1 kg    Intake/Output: I/O last 3 completed shifts: In: 73 [P.O.:60] Out: 200 [Other:200]   Intake/Output this shift:  Total I/O In: -  Out: 50 [Drains:50]  Physical Exam: General: NAD  Head: Normocephalic, atraumatic. Moist oral mucosal membranes  Eyes: Anicteric  Lungs:  Clear to auscultation, normal effort, room air  Heart: Regular rate and rhythm  Abdomen:  Soft, nontender, obese  Extremities: No peripheral edema.  Neurologic: Alert and oriented, moving all four extremities  Skin: No lesions  Access: Left AVG, Rt permcath    Basic Metabolic Panel: Recent Labs  Lab 06/13/22 0803 06/14/22 0257 06/15/22 0302 06/16/22 0153 06/17/22 0150  NA 136 135 135 136 133*  K 4.4 4.9 3.8 4.9 3.5  CL 105  99 98 98 97*  CO2 23 23 28 25 28   GLUCOSE 281* 122* 152* 247* 270*  BUN 37* 45* 27* 38* 34*  CREATININE 5.17* 6.28* 3.96* 4.79* 4.10*  CALCIUM 7.0* 8.0* 7.6* 8.0* 7.7*  PHOS  --  4.6  --   --   --      Liver Function Tests: No results for input(s): "AST", "ALT", "ALKPHOS", "BILITOT", "PROT", "ALBUMIN" in the last 168 hours. No results for input(s): "LIPASE", "AMYLASE" in the last 168 hours. No results for input(s): "AMMONIA" in the last 168 hours.  CBC: Recent Labs  Lab 06/12/22 1936 06/13/22 0803 06/14/22 0257 06/15/22 0302 06/16/22 0153 06/17/22 0150  WBC 16.4* 10.9* 9.8 7.9 11.1* 9.4  NEUTROABS 13.0*  --   --   --   --   --   HGB 9.4* 8.0* 7.9* 8.1* 8.4* 6.8*  HCT 32.0* 26.3* 26.4* 27.0* 27.7* 22.4*  MCV 97.0 95.3 94.6 93.1 93.0 93.3  PLT 164 146* 186 214 224 241     Cardiac Enzymes: No results for input(s): "CKTOTAL", "CKMB", "CKMBINDEX", "TROPONINI" in the last 168 hours.  BNP: Invalid input(s): "POCBNP"  CBG: Recent Labs  Lab 06/16/22 1130 06/16/22 1649 06/16/22 2127 06/17/22 0758 06/17/22 1149  GLUCAP 257* 231* 304* 285* 243*     Microbiology: Results for orders placed or performed during the hospital encounter of 06/12/22  MRSA Next Gen by PCR, Nasal     Status: None   Collection Time: 06/13/22 12:45 AM   Specimen: Nasal Mucosa; Nasal Swab  Result Value Ref Range Status  MRSA by PCR Next Gen NOT DETECTED NOT DETECTED Final    Comment: (NOTE) The GeneXpert MRSA Assay (FDA approved for NASAL specimens only), is one component of a comprehensive MRSA colonization surveillance program. It is not intended to diagnose MRSA infection nor to guide or monitor treatment for MRSA infections. Test performance is not FDA approved in patients less than 60 years old. Performed at [facility], [address] [place], [place] [address]   Culture, blood (Routine X 2) w Reflex to ID Panel     Status: None (Preliminary result)   Collection Time:  06/13/22  9:34 AM   Specimen: BLOOD  Result Value Ref Range Status   Specimen Description BLOOD BLOOD LEFT HAND  Final   Special Requests   Final    BOTTLES DRAWN AEROBIC AND ANAEROBIC Blood Culture adequate volume   Culture   Final    NO GROWTH 4 DAYS Performed at [facility], [address], [place], [place] [address]    Report Status PENDING  Incomplete  Aerobic Culture w Gram Stain (superficial specimen)     Status: None   Collection Time: 06/13/22  9:35 AM   Specimen: Abscess  Result Value Ref Range Status   Specimen Description   Final    ABSCESS S T Performed at [facility], [address], [place], [place] [address]    Special Requests   Final    NONE Performed at [facility], [address] [place], [place] [address]    Gram Stain   Final    FEW GRAM POSITIVE COCCI IN SINGLES IN PAIRS RARE GRAM VARIABLE ROD NO WBC SEEN Performed at [facility], [address], [place], [place] [address]    Culture   Final    FEW MORGANELLA MORGANII FEW ESCHERICHIA COLI FEW STAPHYLOCOCCUS AUREUS    Report Status 06/17/2022 FINAL  Final   Organism ID, Bacteria MORGANELLA MORGANII  Final   Organism ID, Bacteria ESCHERICHIA COLI  Final   Organism ID, Bacteria STAPHYLOCOCCUS AUREUS  Final      Susceptibility   Escherichia coli - MIC*    AMPICILLIN >=32 RESISTANT Resistant     CEFAZOLIN <=4 SENSITIVE Sensitive     CEFEPIME <=0.12 SENSITIVE Sensitive     CEFTAZIDIME <=1 SENSITIVE Sensitive     CEFTRIAXONE <=0.25 SENSITIVE Sensitive     CIPROFLOXACIN >=4 RESISTANT Resistant     GENTAMICIN <=1 SENSITIVE Sensitive     IMIPENEM <=0.25 SENSITIVE Sensitive     TRIMETH/SULFA >=320 RESISTANT Resistant     AMPICILLIN/SULBACTAM 16 INTERMEDIATE Intermediate     PIP/TAZO <=4 SENSITIVE Sensitive     * FEW ESCHERICHIA COLI   Morganella morganii - MIC*    AMPICILLIN >=32 RESISTANT Resistant     CEFAZOLIN RESISTANT Resistant     CEFTAZIDIME <=1  SENSITIVE Sensitive     CIPROFLOXACIN >=4 RESISTANT Resistant     GENTAMICIN <=1 SENSITIVE Sensitive     IMIPENEM 0.5 SENSITIVE Sensitive     TRIMETH/SULFA >=320 RESISTANT Resistant     AMPICILLIN/SULBACTAM 16 INTERMEDIATE Intermediate     PIP/TAZO <=4 SENSITIVE Sensitive     * FEW MORGANELLA MORGANII   Staphylococcus aureus - MIC*    CIPROFLOXACIN >=8 RESISTANT Resistant     ERYTHROMYCIN >=8 RESISTANT Resistant     GENTAMICIN <=0.5 SENSITIVE Sensitive     OXACILLIN 2 SENSITIVE Sensitive     TETRACYCLINE >=16 RESISTANT Resistant     VANCOMYCIN 1 SENSITIVE Sensitive  TRIMETH/SULFA >=320 RESISTANT Resistant     CLINDAMYCIN <=0.25 SENSITIVE Sensitive     RIFAMPIN <=0.5 SENSITIVE Sensitive     Inducible Clindamycin NEGATIVE Sensitive     * FEW STAPHYLOCOCCUS AUREUS  Culture, blood (Routine X 2) w Reflex to ID Panel     Status: None (Preliminary result)   Collection Time: 06/13/22  2:23 PM   Specimen: BLOOD  Result Value Ref Range Status   Specimen Description BLOOD BLOOD RIGHT FOREARM  Final   Special Requests   Final    BOTTLES DRAWN AEROBIC AND ANAEROBIC Blood Culture adequate volume   Culture   Final    NO GROWTH 4 DAYS Performed at [facility], [address] [place], [place] [address]    Report Status PENDING  Incomplete  Aerobic/Anaerobic Culture w Gram Stain (surgical/deep wound)     Status: None (Preliminary result)   Collection Time: 06/15/22  2:37 PM   Specimen: Leg, Right; Wound  Result Value Ref Range Status   Specimen Description   Final    WOUND Performed at [facility], [address], [place], [place] [address]    Special Requests   Final    RIGHT LEG Performed at [facility], [address] [place], [place] [address]    Gram Stain   Final    FEW WBC PRESENT,BOTH PMN AND MONONUCLEAR RARE GRAM POSITIVE COCCI IN PAIRS    Culture   Final    RARE STAPHYLOCOCCUS AUREUS RARE ESCHERICHIA COLI RARE STREPTOCOCCUS  AGALACTIAE TESTING AGAINST S. AGALACTIAE NOT ROUTINELY PERFORMED DUE TO PREDICTABILITY OF AMP/PEN/VAN SUSCEPTIBILITY. CULTURE REINCUBATED FOR BETTER GROWTH Performed at [facility], [place] [address], [place], [place] [address]    Report Status PENDING  Incomplete    Coagulation Studies: No results for input(s): "LABPROT", "INR" in the last 72 hours.  Urinalysis: No results for input(s): "COLORURINE", "LABSPEC", "PHURINE", "GLUCOSEU", "HGBUR", "BILIRUBINUR", "KETONESUR", "PROTEINUR", "UROBILINOGEN", "NITRITE", "LEUKOCYTESUR" in the last 72 hours.  Invalid input(s): "APPERANCEUR"    Imaging: No results found.   Medications:    anticoagulant sodium citrate     ceFEPime (MAXIPIME) IV 1 g (06/16/22 2123)   vancomycin 1,000 mg (06/16/22 1558)    sodium chloride   Intravenous Once   apixaban  5 mg Oral Q12H   atorvastatin  80 mg Oral QHS   calcitRIOL  0.25 mcg Oral Daily   carvedilol  25 mg Oral BID   Chlorhexidine Gluconate Cloth  6 each Topical Daily   ferrous sulfate  325 mg Oral Daily   folic acid  1 mg Oral Daily   gabapentin  200 mg Oral BID   insulin aspart  0-9 Units Subcutaneous TID WC   insulin aspart  2 Units Subcutaneous TID WC   insulin glargine-yfgn  15 Units Subcutaneous QHS   leptospermum manuka honey  1 Application Topical Daily   polyethylene glycol  34 g Oral Daily   torsemide  10 mg Oral Daily   acetaminophen **OR** acetaminophen, albuterol, alteplase, anticoagulant sodium citrate, Glycerin (Adult), heparin, HYDROcodone-acetaminophen, lidocaine (PF), lidocaine-prilocaine, ondansetron **OR** ondansetron (ZOFRAN) IV, oxyCODONE-acetaminophen, pentafluoroprop-tetrafluoroeth, traZODone  Assessment/ Plan:  [name] is a 72 y.o.  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  Patient presents to the  emergency department with increased swelling and pain from right lower extremity.  She has been admitted for Wound infection [T14.8XXA, L08.9]  Osteomyelitis of right leg (HCC) [M86.9]   End-stage renal disease on hemodialysis.  Patient received partial dialysis treatment yesterday.  Due to frequent reminders to minimize movement and HD access extremity, access infiltrated and treatment terminated early.  Will allow access to rest today.  Next treatment scheduled for Friday.  2. Anemia of chronic kidney disease Lab Results  Component Value Date   HGB 6.8 (L) 06/17/2022    Patient receives Mircera at outpatient clinic.  Hemoglobin 6.8, patient will receive 1 unit blood transfusion today.  Will consider low-dose EPO with dialysis treatments.  3. Secondary Hyperparathyroidism: with outpatient labs: PTH 463, phosphorus 6.8, calcium 8.4 on 05/17/22.    Lab Results  Component Value Date   CALCIUM 7.7 (L) 06/17/2022   CAION 1.13 (L) 04/16/2022   PHOS 4.6 06/14/2022    Currently prescribed calcitriol, cholecalciferol, and calcium acetate outpatient. Continue calcitriol daily.  4. Diabetes mellitus type II with chronic kidney disease/renal manifestations: insulin dependent. Home regimen includes Lantus.   Sliding scale insulin managed by primary team.  5. Cellulitis and abscess of right leg, Imaging negative for osteomyelitis. I&D competed on 06/15/22 with NPWV placed. Remains on daily cefepime and Vanc given with dialysis.      LOS: [place] 2/8/202412:20 PM

## 2022-06-17 NOTE — Progress Notes (Signed)
   Date of Admission:  06/12/2022      ID: Jody Nelson is a 72 y.o. female  Principal Problem:   Cellulitis and abscess of right leg Active Problems:   (HFpEF) heart failure with preserved ejection fraction (HCC)   Lymphedema of both lower extremities   H/O: CVA (cerebrovascular accident)   History of obstructive sleep apnea   Essential hypertension   Morbid obesity with BMI of 50.0-59.9, adult ([place])   Sarcoidosis   Uncontrolled type 2 diabetes mellitus with hyperglycemia, with long-term current use of insulin (HCC)   ESRD on hemodialysis (HCC)   Anemia of chronic renal failure   Non-healing wound of right lower extremity   Chronic anticoagulation   Decubitus ulcer of heel, bilateral   Hypercoagulable state (positive anticardiolipin, elevated homocysteine, factor V Leiden deficiency)   Wound infection  CVA, CKD, ESRD on dialysis, factor V leiden, chronic wounds rt leg,lymphedema legs, morbid obesity h/o hardware infection rt tibia  Subjective: Says she is doing okay  Medications:   apixaban  5 mg Oral Q12H   atorvastatin  80 mg Oral QHS   calcitRIOL  0.25 mcg Oral Daily   carvedilol  25 mg Oral BID   Chlorhexidine Gluconate Cloth  6 each Topical Daily   ferrous sulfate  325 mg Oral Daily   folic acid  1 mg Oral Daily   gabapentin  200 mg Oral BID   insulin aspart  0-9 Units Subcutaneous TID WC   insulin aspart  2 Units Subcutaneous TID WC   insulin glargine-yfgn  15 Units Subcutaneous QHS   lactulose  20 g Oral Once   leptospermum manuka honey  1 Application Topical Daily   polyethylene glycol  34 g Oral Daily   torsemide  10 mg Oral Daily    Objective: Vital signs in last 24 hours: Temp:  [97.8 F (36.6 C)-98.9 F (37.2 C)] 98.9 F (37.2 C) (02/08 1600) Pulse Rate:  [70-80] 70 (02/08 1600) Resp:  [15-20] 15 (02/08 1600) BP: (102-140)/(41-52) 126/52 (02/08 1600) SpO2:  [93 %-100 %] 93 % (02/08 1600)  LDA Rt IJ permcath Left arm Fistula  PHYSICAL EXAM:   General: Alert, cooperative, no distress, morbid obesity- speech slurred Lungs:b/l air entry Heart: s1s2 Abdomen: Soft, non-tender,not distended. Bowel sounds normal. No masses Extremities: rt leg- previous surgical hardware infection has healed completely Few wounds that have been explored and has wound vac '  Skin: No rashes or lesions. Or bruising Lymph: Cervical, supraclavicular normal. Neurologic: rt side weakness > left  Lab Results Recent Labs    06/16/22 0153 06/17/22 0150  WBC 11.1* 9.4  HGB 8.4* 6.8*  HCT 27.7* 22.4*  NA 136 133*  K 4.9 3.5  CL 98 97*  CO2 25 28  BUN 38* 34*  CREATININE 4.79* 4.10*    Microbiology: Wound culture- multiple organisms Studies/Results: No results found.   Assessment/Plan: Chronic wounds of the rt lower leg - with underlying lymphedema, coagulopathy , pathology of the tissue was sent and it shows foreign body reaction due to gauze and not  arteritic lesions or other necrotic conditions including lupus Pt currently on vanco and cefepime- polymicrobial infection ( leb. Ecoli, GBS and MSSA) may stop vanco   The site of previous hardware infection has healed completely and these wounds clinically are not related to that    ESRD on dialysis Anemia- getting PRBC  H/o multiple CVA  Discussed the management with patient and care team

## 2022-06-17 NOTE — Progress Notes (Signed)
Physical Therapy Treatment Patient Details Name: Jody Nelson MRN: [id number] DOB: 07-Sep-1950 Today's Date: 06/17/2022   History of Present Illness presented to ER secondary to pain, redness to R LE; admitted for management of cellulitis with chronic, non-healing wound to R LE (history of R tib plateau fracture with hardware/chronic wound infection), s/p I&D (06/15/22)    PT Comments    Improved performance/ability with sit/stand from edge of bed, completing with less physical assist and improved postural control this date. Progressed to include bed/chair transfer wtih RW, min/mod assist +2 for safety. Excessive lateral weight shift to unweight/advance LEs (R >L), but able to complete without physical assist for LE placement. Supports weight in R LE without buckling; no change in pain with WBing efforts    Recommend +2 for return to bed; CNA at bedside during session, informed/aware.   Recommendations for follow up therapy are one component of a multi-disciplinary discharge planning process, led by the attending physician.  Recommendations may be updated based on patient status, additional functional criteria and insurance authorization.  Follow Up Recommendations  Skilled nursing-short term rehab (<3 hours/day) Can patient physically be transported by private vehicle: No   Assistance Recommended at Discharge Frequent or constant Supervision/Assistance  Patient can return home with the following Two people to help with walking and/or transfers;Two people to help with bathing/dressing/bathroom   Equipment Recommendations       Recommendations for Other Services       Precautions / Restrictions Precautions Precautions: Fall Restrictions Weight Bearing Restrictions: No     Mobility  Bed Mobility Overal bed mobility: Needs Assistance Bed Mobility: Supine to Sit     Supine to sit: Mod assist, +2 for physical assistance          Transfers Overall transfer level: Needs  assistance Equipment used: Rolling walker (2 wheels) Transfers: Sit to/from Stand, Bed to chair/wheelchair/BSC Sit to Stand: Min assist, Mod assist, +2 safety/equipment, From elevated surface Stand pivot transfers: Min assist, Mod assist, +2 safety/equipment         General transfer comment: improved performance/ability with sit/stand from edge of bed, completing with less physical assist this date.  Progressed to include bed/chair transfer wtih RW, min/mod assist +2 for safety.  Excessive lateral weight shift to unweight/advance LEs (R >L), but able to complete without physical assist for LE placement.  Supports weight in R LE without buckling; no change in pain with WBing efforts    Ambulation/Gait               General Gait Details: non-ambulatory at baseline   Stairs             Wheelchair Mobility    Modified Rankin (Stroke Patients Only)       Balance Overall balance assessment: Needs assistance Sitting-balance support: No upper extremity supported, Feet supported Sitting balance-Leahy Scale: Good     Standing balance support: Bilateral upper extremity supported Standing balance-Leahy Scale: Fair                   Standardized Balance Assessment Standardized Balance Assessment : (P) Berg Balance Test          Cognition Arousal/Alertness: Awake/alert Behavior During Therapy: WFL for tasks assessed/performed Overall Cognitive Status: Within Functional Limits for tasks assessed                                 General Comments: Oriented to basic information, follows  commands, pleasant and cooperative; mild delay in processing, but completes all tasks/conversations with increased time        Exercises Other Exercises Other Exercises: Sit/stand x3 with RW, min/mod assist +2--noted improvement in performance of mobility task; improved postural extension, improved midline orientation (less R lateral lean) this date.    General  Comments        Pertinent Vitals/Pain Pain Assessment Pain Assessment: Faces Faces Pain Scale: Hurts little more Pain Location: R LE Pain Descriptors / Indicators: Aching, Guarding, Grimacing Pain Intervention(s): Limited activity within patient's tolerance, Monitored during session, Repositioned    Home Living                          Prior Function            PT Goals (current goals can now be found in the care plan section) Acute Rehab PT Goals Patient Stated Goal: to get OOB to chair PT Goal Formulation: With patient Time For Goal Achievement: 06/30/22 Potential to Achieve Goals: Fair Progress towards PT goals: Progressing toward goals    Frequency    Min 2X/week      PT Plan Current plan remains appropriate    Co-evaluation   Reason for Co-Treatment: Complexity of the patient's impairments (multi-system involvement);For patient/therapist safety;To address functional/ADL transfers PT goals addressed during session: Mobility/safety with mobility OT goals addressed during session: ADL's and self-care      AM-PAC PT "6 Clicks" Mobility   Outcome Measure  Help needed turning from your back to your side while in a flat bed without using bedrails?: A Lot Help needed moving from lying on your back to sitting on the side of a flat bed without using bedrails?: A Lot Help needed moving to and from a bed to a chair (including a wheelchair)?: A Lot Help needed standing up from a chair using your arms (e.g., wheelchair or bedside chair)?: A Lot Help needed to walk in hospital room?: Total Help needed climbing 3-5 steps with a railing? : Total 6 Click Score: 10    End of Session   Activity Tolerance: Patient tolerated treatment well Patient left: in chair;with call bell/phone within reach;with chair alarm set Nurse Communication: Mobility status PT Visit Diagnosis: Muscle weakness (generalized) (M62.81);Difficulty in walking, not elsewhere classified  (R26.2);Pain Pain - Right/Left: Right Pain - part of body: Leg     Time: 9326-7124 PT Time Calculation (min) (ACUTE ONLY): 23 min  Charges:  $Therapeutic Activity: 8-22 mins                     Rylan Kaufmann H. Owens Shark, PT, DPT, NCS 06/17/22, 3:29 PM [phone]

## 2022-06-17 NOTE — TOC Progression Note (Signed)
Transition of Care [facility]) - Progression Note    Patient Details  Name: Jody Nelson MRN: [id number] Date of Birth: 12-Jan-1951  [facility]) CM/SW Contact  Beverly Sessions, RN Phone Number: 06/17/2022, 2:12 PM  Clinical Narrative:    Jody Nelson - no beds available - [facility] - does not accept referral unless patient already lives on their campus - [facility] - message left for admsisions  Daughter Jody Nelson updated She is aware that when patient is medically ready patient will return to [facility]  She declines for referral to sent to other facilities in the hub        Expected Discharge Plan and Services                                               Social Determinants of Health (SDOH) Interventions SDOH Screenings   Food Insecurity: No Food Insecurity (06/12/2022)  Housing: [place]  (06/12/2022)  Transportation Needs: No Transportation Needs (06/12/2022)  Utilities: Not At Risk (06/12/2022)  Tobacco Use: Medium Risk (06/16/2022)    Readmission Risk Interventions     No data to display

## 2022-06-17 NOTE — Progress Notes (Signed)
Subjective: 2 Days Post-Op Procedure(s) (LRB): IRRIGATION AND DEBRIDEMENT WOUND (Right) APPLICATION OF WOUND VAC (Right) Patient reports pain as mild to moderate.  Feels improvement in her pain level. Patient is well, and has had no acute complaints or problems.  Complained of tightness in the right leg. Plan is to go Rehab after hospital stay. Negative for chest pain and shortness of breath Fever: no Gastrointestinal: Negative for nausea and vomiting  Objective: Vital signs in last 24 hours: Temp:  [97.8 F (36.6 C)-98.1 F (36.7 C)] 97.8 F (36.6 C) (02/07 2317) Pulse Rate:  [73-85] 73 (02/07 2317) Resp:  [12-20] 20 (02/07 2317) BP: (93-148)/(44-110) 140/47 (02/07 2317) SpO2:  [98 %-100 %] 100 % (02/07 2317) Weight:  [138.1 kg] 138.1 kg (02/07 1205)  Intake/Output from previous day:  Intake/Output Summary (Last 24 hours) at 06/17/2022 0647 Last data filed at 06/16/2022 2123 Gross per 24 hour  Intake 60 ml  Output 200 ml  Net -140 ml    Intake/Output this shift: Total I/O In: 60 [P.O.:60] Out: -   Labs: Recent Labs    06/15/22 0302 06/16/22 0153 06/17/22 0150  HGB 8.1* 8.4* 6.8*   Recent Labs    06/16/22 0153 06/17/22 0150  WBC 11.1* 9.4  RBC 2.98* 2.40*  HCT 27.7* 22.4*  PLT 224 241   Recent Labs    06/16/22 0153 06/17/22 0150  NA 136 133*  K 4.9 3.5  CL 98 97*  CO2 25 28  BUN 38* 34*  CREATININE 4.79* 4.10*  GLUCOSE 247* 270*  CALCIUM 8.0* 7.7*   No results for input(s): "LABPT", "INR" in the last 72 hours.   EXAM General - Patient is Alert and Oriented Extremity - Sensation intact distally Dorsiflexion/Plantar flexion intact Compartment soft Dressing/Incision - clean, dry, with the wound VAC in place. Motor Function - intact, moving foot and toes well on exam.   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   At high risk for falls    Atypical chest pain    Bilateral lower extremity edema    Chronic right sided weakness    Cryptogenic stroke ([place]) 02/27/2011   a.) MRI brain 02/27/2011: old lacunar infarct in RIGHT pons and medulla oblongata   Cryptogenic stroke ([place]) 10/11/2012   a.) CT brain 10/11/2012: tiny old lacunar infarct in head of caudate nucleus (left)   Cryptogenic stroke ([place]) 10/12/2012   a.) MRI brain 10/12/2012: multiple acute LEFT hemispheric infarcts   Cryptogenic stroke ([place]) 06/26/2014   a.) MRI brain 06/26/2014: acute RIGHT posterior frontal lobe infarct involving both and grey matter   Cryptogenic stroke ([place]) 07/29/2015   a.) MRI brain 07/29/2015: 2 foci of diffusion restriction in the LEFT parietal lobe consistent with acute infarction   Cryptogenic stroke ([place]) 07/24/2017   a.) MRI brain 07/24/2017: punctate focus of acute ischemia in the medial LEFT temporal lobe   DDD (degenerative disc disease), cervical    Depression    DOE (dyspnea on exertion)    Dysarthria as late effect of stroke  ESRD (end stage renal disease) on dialysis [facility])    a.) M-W-F   GERD (gastroesophageal reflux disease)    Hallux valgus, bilateral    Heterozygous factor V Leiden mutation ([place]) 07/18/2018   History of cardiac catheterization 08/30/2008   a.) LHC 08/30/2008: EF >60%, LVEDP 18 mmHg, normal coronaries.   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   a.) 07/18/2018 --> 22 mol/L   Hypertension    Insomnia    a.) melatonin + trazodone PRN   Long term current use of anticoagulant    a.) apixaban   Lymphedema of both lower extremities    Meralgia paresthetica, left lower limb    OSA (obstructive sleep apnea)    a.) does not require nocturnal PAP therapy   Osteoarthritis    Pneumonia    Pseudophakia of both eyes    Sarcoidosis    Secondary hyperparathyroidism of renal origin ([place])    Type 2 diabetes mellitus treated with insulin ([place])    Vitamin D deficiency     Assessment/Plan: 2 Days Post-Op Procedure(s) (LRB): IRRIGATION AND DEBRIDEMENT WOUND (Right) APPLICATION OF WOUND VAC (Right) Principal Problem:   Cellulitis and abscess of right leg Active Problems:   (HFpEF) heart failure with preserved ejection fraction (HCC)   Lymphedema of both lower extremities   H/O: CVA (cerebrovascular accident)   History of obstructive sleep apnea   Essential hypertension   Morbid obesity with BMI of 50.0-59.9, adult ([place])   Sarcoidosis   Uncontrolled type 2 diabetes mellitus with hyperglycemia, with long-term current use of insulin (HCC)   ESRD on hemodialysis (HCC)   Anemia of chronic renal failure   Non-healing wound of right lower extremity   Chronic anticoagulation   Decubitus ulcer of heel, bilateral   Hypercoagulable state (positive anticardiolipin, elevated homocysteine, factor V Leiden deficiency)   Wound infection  Estimated body mass index is 52.26 kg/m as calculated from the following:   Height as of this encounter: 5\' 4"  (1.626 m).   Weight as of this encounter: 138.1 kg. Advance diet Up with therapy  Continue IV antibiotics.  Appreciate wound care and infectious disease involvement.  DVT Prophylaxis -  Eliquis Weight-Bearing as tolerated to right leg  Reche Dixon, PA-C Orthopaedic Surgery 06/17/2022, 6:47 AM

## 2022-06-17 NOTE — Plan of Care (Signed)
  Problem: Elimination: Goal: Will not experience complications related to bowel motility Outcome: Progressing   Problem: Coping: Goal: Level of anxiety will decrease Outcome: Progressing   Problem: Nutrition: Goal: Adequate nutrition will be maintained Outcome: Progressing   Problem: Activity: Goal: Risk for activity intolerance will decrease Outcome: Progressing   Problem: Safety: Goal: Ability to remain free from injury will improve Outcome: Progressing   Problem: Pain Managment: Goal: General experience of comfort will improve Outcome: Progressing

## 2022-06-18 DIAGNOSIS — L03115 Cellulitis of right lower limb: Secondary | ICD-10-CM | POA: Diagnosis not present

## 2022-06-18 DIAGNOSIS — L02415 Cutaneous abscess of right lower limb: Secondary | ICD-10-CM | POA: Diagnosis not present

## 2022-06-18 DIAGNOSIS — N186 End stage renal disease: Secondary | ICD-10-CM | POA: Diagnosis not present

## 2022-06-18 DIAGNOSIS — N189 Chronic kidney disease, unspecified: Secondary | ICD-10-CM | POA: Diagnosis not present

## 2022-06-18 LAB — CBC
HCT: 25.8 % — ABNORMAL LOW (ref 36.0–46.0)
Hemoglobin: 8 g/dL — ABNORMAL LOW (ref 12.0–15.0)
MCH: 28.7 pg (ref 26.0–34.0)
MCHC: 31 g/dL (ref 30.0–36.0)
MCV: 92.5 fL (ref 80.0–100.0)
Platelets: 273 10*3/uL (ref 150–400)
RBC: 2.79 MIL/uL — ABNORMAL LOW (ref 3.87–5.11)
RDW: 15.9 % — ABNORMAL HIGH (ref 11.5–15.5)
WBC: 8.9 10*3/uL (ref 4.0–10.5)
nRBC: 0 % (ref 0.0–0.2)

## 2022-06-18 LAB — CULTURE, BLOOD (ROUTINE X 2)
Culture: NO GROWTH
Culture: NO GROWTH
Special Requests: ADEQUATE
Special Requests: ADEQUATE

## 2022-06-18 LAB — TYPE AND SCREEN
ABO/RH(D): A POS
Antibody Screen: NEGATIVE
Unit division: 0

## 2022-06-18 LAB — BASIC METABOLIC PANEL
Anion gap: 9 (ref 5–15)
BUN: 42 mg/dL — ABNORMAL HIGH (ref 8–23)
CO2: 27 mmol/L (ref 22–32)
Calcium: 7.8 mg/dL — ABNORMAL LOW (ref 8.9–10.3)
Chloride: 99 mmol/L (ref 98–111)
Creatinine, Ser: 4.9 mg/dL — ABNORMAL HIGH (ref 0.44–1.00)
GFR, Estimated: 9 mL/min — ABNORMAL LOW (ref >60)
Glucose, Bld: 200 mg/dL — ABNORMAL HIGH (ref 70–99)
Potassium: 3.7 mmol/L (ref 3.5–5.1)
Sodium: 135 mmol/L (ref 135–145)

## 2022-06-18 LAB — GLUCOSE, CAPILLARY
Glucose-Capillary: 164 mg/dL — ABNORMAL HIGH (ref 70–99)
Glucose-Capillary: 105 mg/dL — ABNORMAL HIGH (ref 70–99)
Glucose-Capillary: 157 mg/dL — ABNORMAL HIGH (ref 70–99)
Glucose-Capillary: 163 mg/dL — ABNORMAL HIGH (ref 70–99)

## 2022-06-18 LAB — BPAM RBC
Blood Product Expiration Date: 202403092359
ISSUE DATE / TIME: 202402081310
Unit Type and Rh: 6200

## 2022-06-18 MED ORDER — HYDROCODONE-ACETAMINOPHEN 5-325 MG PO TABS: 1.0000 | ORAL_TABLET | Freq: Three times a day (TID) | ORAL | 0 refills | Status: DC | PRN

## 2022-06-18 MED ORDER — OXYCODONE-ACETAMINOPHEN 5-325 MG PO TABS: 1.0000 | ORAL_TABLET | Freq: Four times a day (QID) | ORAL | 0 refills | Status: DC | PRN

## 2022-06-18 MED ORDER — PENTAFLUOROPROP-TETRAFLUOROETH EX AERO
INHALATION_SPRAY | CUTANEOUS | Status: AC
  Filled 2022-06-18: qty 30

## 2022-06-18 MED ORDER — SODIUM CHLORIDE 0.9 % IV SOLN
2.0000 g | INTRAVENOUS | Status: DC
  Administered 2022-06-18 – 2022-06-21 (×2): 2 g via INTRAVENOUS
  Filled 2022-06-18 (×2): qty 2
  Filled 2022-06-18 (×2): qty 12.5

## 2022-06-18 MED ORDER — CEFEPIME IV (FOR PTA / DISCHARGE USE ONLY): 2.0000 g | INTRAVENOUS | 0 refills | Status: DC

## 2022-06-18 NOTE — Progress Notes (Signed)
PT did 3hrs of HD. Tolerated well with no signs of distress Venus needle infiltrated so we did 1:1  using the HD cath. NP notifed.  UF = 500 ml    06/18/22 1232  Vitals  Temp 98.4 F (36.9 C)  Temp Source Oral  BP (!) 126/48  MAP (mmHg) 71  BP Location Right Arm  BP Method Automatic  Patient Position (if appropriate) Lying  Pulse Rate 72  Pulse Rate Source Monitor  ECG Heart Rate 77  Resp (!) 28  Oxygen Therapy  SpO2 100 %  O2 Device Room Air  Patient Activity (if Appropriate) In bed  Pulse Oximetry Type Continuous  During Treatment Monitoring  Blood Flow Rate (mL/min) 400 mL/min  Arterial Pressure (mmHg) -260 mmHg  Venous Pressure (mmHg) 150 mmHg  TMP (mmHg) 2 mmHg  Ultrafiltration Rate (mL/min) 433 mL/min  Dialysate Flow Rate (mL/min) 300 ml/min  HD Safety Checks Performed Yes  Intra-Hemodialysis Comments Tx completed;Tolerated well  Post Treatment  Dialyzer Clearance Lightly streaked  Duration of HD Treatment -hour(s) 3 hour(s)  Hemodialysis Intake (mL) 0 mL  Liters Processed 72  Fluid Removed (mL) 500 mL  Tolerated HD Treatment Yes  AVG/AVF Arterial Site Held (minutes) 10 minutes  AVG/AVF Venous Site Held (minutes) 10 minutes  Fistula / Graft Left Upper arm Arteriovenous vein graft  Placement Date/Time: 04/16/22 1044   Placed prior to admission: No  Orientation: Left  Access Location: Upper arm  Access Type: Arteriovenous vein graft  Site Condition Other (Comment) (venus infiltrated, used 1:1)  Fistula / Graft Assessment Present;Thrill;Bruit  Status Deaccessed  Drainage Description None

## 2022-06-18 NOTE — TOC Progression Note (Addendum)
Transition of Care [facility]) - Progression Note    Patient Details  Name: Jody Nelson MRN: [id number] Date of Birth: 1950-06-07  Transition of Care [facility]) CM/SW Contact  Beverly Sessions, RN Phone Number: 06/18/2022, 3:09 PM  Clinical Narrative:     Per Caryl Pina with [facility] they will not have the wound vac today If they get the vac over the weekend patient can admit over the weekend. MD updated, voicemail left for daughter andrewnetta          Expected Discharge Plan and Services                                               Social Determinants of Health (SDOH) Interventions SDOH Screenings   Food Insecurity: No Food Insecurity (06/12/2022)  Housing: Low Risk  (06/12/2022)  Transportation Needs: No Transportation Needs (06/12/2022)  Utilities: Not At Risk (06/12/2022)  Tobacco Use: Medium Risk (06/16/2022)    Readmission Risk Interventions     No data to display

## 2022-06-18 NOTE — Plan of Care (Signed)
  Problem: Education: Goal: Knowledge of General Education information will improve Description: Including pain rating scale, medication(s)/side effects and non-pharmacologic comfort measures Outcome: Progressing   Problem: Health Behavior/Discharge Planning: Goal: Ability to manage health-related needs will improve Outcome: Progressing   Problem: Clinical Measurements: Goal: Will remain free from infection Outcome: Progressing   Problem: Coping: Goal: Level of anxiety will decrease Outcome: Progressing   Problem: Pain Managment: Goal: General experience of comfort will improve Outcome: Progressing   Problem: Education: Goal: Individualized Educational Video(s) Outcome: Progressing   Problem: Health Behavior/Discharge Planning: Goal: Ability to manage health-related needs will improve Outcome: Progressing

## 2022-06-18 NOTE — Progress Notes (Signed)
PHARMACY CONSULT NOTE FOR:  OUTPATIENT  PARENTERAL ANTIBIOTIC THERAPY (OPAT)  Indication: Polymicrobial infection of right leg Regimen: Cefepime 2gm IV with HD on MWF End date: 06/29/2022  (last dose will be with 2/19 HD)  IV antibiotic discharge orders are pended. To discharging provider:  please sign these orders via discharge navigator,  Select New Orders & click on the button choice - Manage This Unsigned Work.     Thank you for allowing pharmacy to be a part of this patient's care.  Doreene Eland, PharmD, BCPS, BCIDP Work Cell: [phone] 06/18/2022 1:04 PM

## 2022-06-18 NOTE — Inpatient Diabetes Management (Signed)
Inpatient Diabetes Program Recommendations  AACE/ADA: New Consensus Statement on Inpatient Glycemic Control   Target Ranges:  Prepandial:   less than 140 mg/dL      Peak postprandial:   less than 180 mg/dL (1-2 hours)      Critically ill patients:  140 - 180 mg/dL    Latest Reference Range & Units 06/17/22 07:58 06/17/22 11:49 06/17/22 16:56 06/17/22 21:36 06/18/22 08:36  Glucose-Capillary 70 - 99 mg/dL 285 (H) 243 (H) 220 (H) 183 (H) 164 (H)   Review of Glycemic Control  Diabetes history: DM2 Outpatient Diabetes medications: Lantus 10 units daily, Humalog 2-12 units TID with meals Current orders for Inpatient glycemic control: Semglee 15 units QHS, Novolog 0-9 units TID with meals, Novolog 2 units TID with meals  Inpatient Diabetes Program Recommendations:    Insulin: Please consider increasing meal coverage to Novolog 5 units TID with meals.  Thanks, Barnie Alderman, RN, MSN, [place] Diabetes Coordinator Inpatient Diabetes Program [phone] (Team Pager from 8am to [place])

## 2022-06-18 NOTE — Plan of Care (Signed)
  Problem: Skin Integrity: Goal: Skin integrity will improve Outcome: Progressing   Problem: Activity: Goal: Risk for activity intolerance will decrease Outcome: Progressing   Problem: Elimination: Goal: Will not experience complications related to bowel motility Outcome: Progressing Goal: Will not experience complications related to urinary retention Outcome: Progressing   Problem: Pain Managment: Goal: General experience of comfort will improve Outcome: Progressing

## 2022-06-18 NOTE — Progress Notes (Signed)
PT Cancellation Note  Patient Details Name: Jody Nelson MRN: [id number] DOB: 11/07/1950   Cancelled Treatment:    Reason Eval/Treat Not Completed: Patient at procedure or test/unavailable. Patient currently in HD. Will re-attempt later if time allows.    Latanza Pfefferkorn 06/18/2022, 10:11 AM

## 2022-06-18 NOTE — Plan of Care (Signed)
  Problem: Clinical Measurements: ?Goal: Ability to avoid or minimize complications of infection will improve ?Outcome: Progressing ?  ?Problem: Skin Integrity: ?Goal: Skin integrity will improve ?Outcome: Progressing ?  ?

## 2022-06-18 NOTE — Treatment Plan (Signed)
Diagnosis: Multiple wounds rt leg with polymicrobial infection Baseline Creatinine ESRD   Allergies  Allergen Reactions   Sulfamethoxazole-Trimethoprim     Other reaction(s): Kidney Disorder Other reaction(s): Kidney Disorder Hyperkalemia, AKI Hyperkalemia, AKI     OPAT Orders Discharge antibiotics: D=Cefepime 2 grams IV during dialysis for 2 weeks Vancomycin 1 gram Iv during dialysis  End Date: 06/29/22   Labs weekly while on IV antibiotics: _X_ CBC with differential  _X_ CMP   Fax weekly lab results  promptly to [phone]  Follow up at wound clinic No ID follow up needed   Call [phone] with any questions

## 2022-06-18 NOTE — TOC Progression Note (Signed)
Transition of Care [facility]) - Progression Note    Patient Details  Name: Jody Nelson MRN: [id number] Date of Birth: 04/19/1951  [facility]) CM/SW Contact  Beverly Sessions, RN Phone Number: 06/18/2022, 10:46 AM  Clinical Narrative:    Per [facility] at [facility] they do not yet have the wound vac.  Per ortho patient can not go more than a few hours with out the vac Wound measurements sent to [place] at [facility].  She is to let me know when the vac will be available         Expected Discharge Plan and Services                                               Social Determinants of Health (SDOH) Interventions SDOH Screenings   Food Insecurity: No Food Insecurity (06/12/2022)  Housing: Low Risk  (06/12/2022)  Transportation Needs: No Transportation Needs (06/12/2022)  Utilities: Not At Risk (06/12/2022)  Tobacco Use: Medium Risk (06/16/2022)    Readmission Risk Interventions     No data to display

## 2022-06-18 NOTE — Progress Notes (Signed)
Subjective: 3 Days Post-Op Procedure(s) (LRB): IRRIGATION AND DEBRIDEMENT WOUND (Right) APPLICATION OF WOUND VAC (Right) Patient reports pain as mild to moderate.  Feels improvement in her pain level. Patient is well, and has had no acute complaints or problems.  Less pain and tightness. Plan is to go Rehab after hospital stay. Negative for chest pain and shortness of breath Fever: no Gastrointestinal: Negative for nausea and vomiting  Objective: Vital signs in last 24 hours: Temp:  [97.7 F (36.5 C)-98.9 F (37.2 C)] 97.8 F (36.6 C) (02/09 0544) Pulse Rate:  [70-79] 79 (02/09 0544) Resp:  [15-18] 17 (02/09 0544) BP: (102-127)/(41-52) 124/50 (02/09 0544) SpO2:  [93 %-100 %] 100 % (02/09 0544)  Intake/Output from previous day:  Intake/Output Summary (Last 24 hours) at 06/18/2022 0648 Last data filed at 06/18/2022 0045 Gross per 24 hour  Intake 667.66 ml  Output 50 ml  Net 617.66 ml    Intake/Output this shift: Total I/O In: 213.3 [P.O.:120; I.V.:93.3] Out: -   Labs: Recent Labs    06/16/22 0153 06/17/22 0150 06/17/22 1919 06/18/22 0329  HGB 8.4* 6.8* 8.3* 8.0*   Recent Labs    06/17/22 0150 06/17/22 1919 06/18/22 0329  WBC 9.4  --  8.9  RBC 2.40*  --  2.79*  HCT 22.4* 27.0* 25.8*  PLT 241  --  273   Recent Labs    06/17/22 0150 06/18/22 0329  NA 133* 135  K 3.5 3.7  CL 97* 99  CO2 28 27  BUN 34* 42*  CREATININE 4.10* 4.90*  GLUCOSE 270* 200*  CALCIUM 7.7* 7.8*   No results for input(s): "LABPT", "INR" in the last 72 hours.   EXAM General - Patient is Alert and Oriented Extremity - Sensation intact distally Dorsiflexion/Plantar flexion intact Compartment soft Dressing/Incision - clean, dry, with the wound VAC in place. Motor Function - intact, moving foot and toes well on exam.   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   At high risk for falls    Atypical chest pain    Bilateral lower extremity edema    Chronic right sided weakness    Cryptogenic stroke ([place]) 02/27/2011   a.) MRI brain 02/27/2011: old lacunar infarct in RIGHT pons and medulla oblongata   Cryptogenic stroke ([place]) 10/11/2012   a.) CT brain 10/11/2012: tiny old lacunar infarct in head of caudate nucleus (left)   Cryptogenic stroke ([place]) 10/12/2012   a.) MRI brain 10/12/2012: multiple acute LEFT hemispheric infarcts   Cryptogenic stroke ([place]) 06/26/2014   a.) MRI brain 06/26/2014: acute RIGHT posterior frontal lobe infarct involving both and grey matter   Cryptogenic stroke ([place]) 07/29/2015   a.) MRI brain 07/29/2015: 2 foci of diffusion restriction in the LEFT parietal lobe consistent with acute infarction   Cryptogenic stroke ([place]) 07/24/2017   a.) MRI brain 07/24/2017: punctate focus of acute ischemia in the medial LEFT temporal lobe   DDD (degenerative disc disease), cervical    Depression    DOE (dyspnea on exertion)    Dysarthria as late  effect of stroke    ESRD (end stage renal disease) on dialysis [facility])    a.) M-W-F   GERD (gastroesophageal reflux disease)    Hallux valgus, bilateral    Heterozygous factor V Leiden mutation ([place]) 07/18/2018   History of cardiac catheterization 08/30/2008   a.) LHC 08/30/2008: EF >60%, LVEDP 18 mmHg, normal coronaries.   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   a.) 07/18/2018 --> 22 mol/L   Hypertension    Insomnia    a.) melatonin + trazodone PRN   Long term current use of anticoagulant    a.) apixaban   Lymphedema of both lower extremities    Meralgia paresthetica, left lower limb    OSA (obstructive sleep apnea)    a.) does not require nocturnal PAP therapy   Osteoarthritis    Pneumonia    Pseudophakia of both eyes    Sarcoidosis    Secondary hyperparathyroidism of renal origin ([place])    Type 2 diabetes mellitus treated with insulin ([place])    Vitamin D deficiency     Assessment/Plan: 3 Days Post-Op Procedure(s) (LRB): IRRIGATION AND DEBRIDEMENT WOUND (Right) APPLICATION OF WOUND VAC (Right) Principal Problem:   Cellulitis and abscess of right leg Active Problems:   (HFpEF) heart failure with preserved ejection fraction (HCC)   Lymphedema of both lower extremities   H/O: CVA (cerebrovascular accident)   History of obstructive sleep apnea   Essential hypertension   Morbid obesity with BMI of 50.0-59.9, adult ([place])   Sarcoidosis   Uncontrolled type 2 diabetes mellitus with hyperglycemia, with long-term current use of insulin (HCC)   ESRD on hemodialysis (HCC)   Anemia of chronic renal failure   Non-healing wound of right lower extremity   Chronic anticoagulation   Decubitus ulcer of heel, bilateral   Hypercoagulable state (positive anticardiolipin, elevated homocysteine, factor V Leiden deficiency)   Wound infection   Multiple open wounds of lower leg  Estimated body mass index is 52.26 kg/m as calculated from the following:   Height as of this encounter: 5' 4"$  (1.626 m).   Weight as of this encounter: 138.1 kg. Advance diet Up with therapy  Continue IV antibiotics.  Appreciate wound care and infectious disease involvement.  Wound VAC to be changed today by wound care team.  DVT Prophylaxis -  Eliquis Weight-Bearing as tolerated to right leg  Reche Dixon,  PA-C Orthopaedic Surgery 06/18/2022, 6:48 AM

## 2022-06-18 NOTE — Consult Note (Addendum)
[place] Nurse Consult Note: Reason for Consult:NPWT dressing change to RLE. She is s/p I&D. Wound type:Chronic, nonhealing full thickness wounds with tunnelling Pressure Injury POA: Yes/No/NA Measurement:anterior wound measures 1.5cm round x 1cm with red wound bed and scant exudate. Lateral wound measures 6cm x 3cm x 3cm with tuneling at 6 o'clock measuring 6cm. Wound bed is red, and there is a small amount of serous exudate. Wound bed: Drainage (amount, consistency, odor)  Periwound: Dressing procedure/placement/frequency:Patient is premedicated for pain.  Two pieces of silver foam are removed from wound bed.  There is a thin, friable skin bridge (<0.4cm) between the two wounds, so I will dress together.The wounds do not communicate, but may eventually. Two pieces of black foam are used to obliterate the dead space and a thin strip of drape is applied over the skin bridge. The foam in the small wound is stretched to touch the foam in the larger defect and the dressing is covered with DermaTac drape. The dressing is attached to `130mHg continuous negative pressure and an immediate seal is achieved. Patient tolerated the procedure well although she did say that the dressing felt "tight". She is reminded that the NPWT device has been malfunctioning for several hours and that there was no suction during that time. The patient rotates her RLE laterally and this places pressure upon the larger defect. I will provide Prevalon boots to correct foot alignment. A sacral silicone foam is also added for PI prevention.A pressure redistribution chair cushion is provided for use when the patient is OOB in the [facility] both here [facility] for PI prevention.  [place] will follow, will remain available to this patient, the nursing and medical teams.  Next dressing change is due on Monday, February 12. Dressing supplies are kept on the unit for NPWT, but there is one left in  the room for Monday's use. Additional sheets of DermaTac drape are also left in room in the event patching of the dressing is required.  Thank you for inviting [place] participate in this patient's Plan of Care.  LMaudie Flakes MSN, RN, CNS, GDevon CSerita Grammes [employer] FUnis[employer] Corporationphone:  [phone]

## 2022-06-18 NOTE — Discharge Summary (Signed)
Physician Discharge Summary   Patient: Jody Nelson MRN: [id number] DOB: [birth year]  Admit date:     06/12/2022  Discharge date: 06/18/22  Discharge Physician: Sharen Hones   PCP: Patient, No Pcp Per   Recommendations at discharge:   Follow-up with PCP in 1 week. Follow-up with orthopedics in 1 week. Follow-up with wound care.  Discharge Diagnoses: Principal Problem:   Cellulitis and abscess of right leg Active Problems:   Non-healing wound of right lower extremity   Lymphedema of both lower extremities   Uncontrolled type 2 diabetes mellitus with hyperglycemia, with long-term current use of insulin (HCC)   ESRD on hemodialysis (HCC)   Chronic anticoagulation   Hypercoagulable state (positive anticardiolipin, elevated homocysteine, factor V Leiden deficiency)   (HFpEF) heart failure with preserved ejection fraction (HCC)   H/O: CVA (cerebrovascular accident)   History of obstructive sleep apnea   Essential hypertension   Morbid obesity with BMI of 50.0-59.9, adult (HCC)   Sarcoidosis   Anemia of chronic renal failure   Decubitus ulcer of heel, bilateral   Wound infection   Multiple open wounds of lower leg  Resolved Problems:   * No resolved hospital problems. [facility] Course: Jody Nelson is a 72 y.o. 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   Patient initially was placed on vancomycin and meropenem after seen by ID.  Patient also had a debridement performed 02/06, a wound VAC was placed.  Antibiotic switched to cefepime and vancomycin today by ID, which she can be continued through dialysis.  Assessment and Plan:  Cellulitis and abscess of right leg below to multi bacterial infection Nonhealing wound right shin/possible osteomyelitis History of right tibial plateau fracture 2019 with hardware and chronic wound infection Chronic lymphedema Status post I&D with wound VAC.  Appreciate ID consult.  Antibiotic switched to cefepime and vancomycin.   Wound culture final result showed E. coli, MSSA,.  Morganella morganii.  Discussed with ID, patient will finish antibiotics with IV cefepime after each dialysis. Patient also has wound VAC placed, continued in the nursing home.  Patient be followed by orthopedics as well as wound care.   Uncontrolled type 2 diabetes mellitus with hyperglycemia, with long-term current use of insulin (HCC) Hemoglobin A1c 8.7, resumed home dose insulin.   ESRD on hemodialysis [facility]) Hemodialysis per nephrology.   Chronic anticoagulation Hypercoagulability 2020 (positive anticardiolipin, elevated homocysteine, factor V Leiden deficiency) h/o multiple cryptogenic CVAs Continue Eliquis.   Decubitus ulcer of heel, bilateral Wound care  Anemia of chronic renal failure Patient required 1 unit PRBC transfusion, hemoglobin has been stable.   Sarcoidosis Biopsy proven     Morbid obesity with BMI of 50.0-59.9, adult ([place]) Continue to follow, diet exercise advised.   Essential hypertension Continue home medicines.   History of obstructive sleep apnea CPAP nightly   H/O: CVA (cerebrovascular accident) PT will evaluate patient.   (HFpEF) heart failure with  preserved ejection fraction (HCC) Condition stable.        Consultants: Nephrology, ID, orthopedics. Procedures performed: I&D of the  wound. Disposition: Skilled nursing facility Diet recommendation:  Cardiac diet DISCHARGE MEDICATION: Allergies as of 06/18/2022       Reactions   Sulfamethoxazole-trimethoprim    Other reaction(s): Kidney Disorder Other reaction(s): Kidney Disorder Hyperkalemia, AKI Hyperkalemia, AKI        Medication List     STOP taking these medications    fluconazole 150 MG tablet Commonly known as: DIFLUCAN   furosemide 40 MG tablet Commonly known as: LASIX   Lagevrio 200 MG Caps capsule Generic drug: molnupiravir EUA   nystatin powder Generic drug: nystatin       TAKE these medications    acetaminophen 325 MG tablet Commonly known as: TYLENOL Take 2 tablets by mouth every 8 (eight) hours as needed.   ACIDOPHILUS LACTOBACILLUS PO Take 1 capsule by mouth 3 (three) times daily.   apixaban 5 MG Tabs tablet Commonly known as: ELIQUIS Take 1 tablet by mouth every 12 (twelve) hours.   atorvastatin 80 MG tablet Commonly known as: LIPITOR Take 80 mg by mouth daily.   calcitRIOL 0.25 MCG capsule Commonly known as: ROCALTROL Take 0.25 mcg by mouth daily.   calcium acetate 667 MG capsule Commonly known as: PHOSLO Take one capsule by mouth twice daily with food on Mondays, Wednesdays, and Fridays. Take one capsule 3 times daily on Tuesdays, Thursdays, Saturdays, and Sundays.   carvedilol 25 MG tablet Commonly known as: COREG Take 1 tablet by mouth 2 (two) times daily with a meal.   ceFEPime  IVPB Commonly known as: MAXIPIME Inject 2 g into the vein every Monday, Wednesday, and Friday with hemodialysis. Cefepime 2gm IV after hemodialysis on MWF Indication:  polymicrobial left leg infection Last Day of Therapy:  06/28/2022 To be given at HD center Start taking on: June 21, 2022   diclofenac Sodium 1 % Gel Commonly known as: VOLTAREN Apply topically.   docusate sodium 100 MG capsule Commonly known as: COLACE Take 100 mg by mouth 2 (two) times daily.   ferrous  sulfate 324 (65 Fe) MG Tbec Take 1 tablet by mouth daily.   folic acid 1 MG tablet Commonly known as: FOLVITE Take 1 mg by mouth daily.   gabapentin 100 MG capsule Commonly known as: NEURONTIN Take 2 capsules by mouth 2 (two) times daily.   HYDROcodone-acetaminophen 5-325 MG tablet Commonly known as: NORCO/VICODIN Take 1 tablet by mouth every 8 (eight) hours as needed for moderate pain. What changed: Another medication with the same name was removed. Continue taking this medication, and follow the directions you see here.   insulin glargine 100 UNIT/ML Solostar Pen Commonly known as: LANTUS Inject 10 Units into the skin at bedtime.   insulin lispro 100 UNIT/ML injection Commonly known as: HUMALOG Inject 2-12 Units into the skin in the morning, at noon, and at bedtime. Per sliding scale   ipratropium-albuterol 0.5-2.5 (3) MG/3ML Soln Commonly known as: DUONEB Take by nebulization.   latanoprost 0.005 % ophthalmic solution Commonly known as: XALATAN Place 1 drop into both eyes at bedtime.   Lidocaine Pain Relief 4 % Generic drug: lidocaine 1 patch daily. 12 hours on, 12 hours off.   metroNIDAZOLE 1 % gel Commonly known as: METROGEL Apply topically.   montelukast 10 MG tablet Commonly known as: SINGULAIR Take 10 mg by mouth daily.   omeprazole 10 MG capsule Commonly known as: PRILOSEC Take 10 mg  by mouth daily.   Oyster Shell Calcium w/D 500-5 MG-MCG Tabs Take 1 tablet by mouth daily as needed.   polyethylene glycol 17 g packet Commonly known as: MIRALAX / GLYCOLAX Take 17 g by mouth daily.   PreserVision AREDS Tabs Take 250 mg by mouth 2 (two) times daily.   torsemide 10 MG tablet Commonly known as: DEMADEX Take 10 mg by mouth daily.   traZODone 50 MG tablet Commonly known as: DESYREL Take 1 tablet by mouth at bedtime as needed.               Home Infusion Instuctions  (From admission, onward)           Start     Ordered   06/18/22 0000   Home infusion instructions       Question:  Instructions  Answer:  Flushing of vascular access device: 0.9% NaCl pre/post medication administration and prn patency; Heparin 100 u/ml, 49m for implanted ports and Heparin 10u/ml, 574mfor all other central venous catheters.   06/18/22 1143            Follow-up Information     PaLeim FabryMD Follow up in 2 week(s).   Specialty: Orthopedic Surgery Why: Wound check Contact information: [address]  [place] [phone]              Discharge Exam: Filed Weights   06/14/22 1845 06/16/22 1205 06/18/22 0855  Weight: (!) 139.9 kg (!) 138.1 kg (!) 142.2 kg   General exam: Appears calm and comfortable  Respiratory system: Clear to auscultation. Respiratory effort normal. Cardiovascular system: S1 & S2 heard, RRR. No JVD, murmurs, rubs, gallops or clicks. No pedal edema. Gastrointestinal system: Abdomen is nondistended, soft and nontender. No organomegaly or masses felt. Normal bowel sounds heard. Central nervous system: Alert and oriented. No focal neurological deficits. Extremities: Right leg has some swelling with wound VAC.   Skin: No rashes, lesions or ulcers Psychiatry: Judgement and insight appear normal. Mood & affect appropriate. '  Condition at discharge: fair  The results of significant diagnostics from this hospitalization (including imaging, microbiology, ancillary and laboratory) are listed below for reference.   Imaging Studies: CT EXTREMITY LOWER RIGHT WO CONTRAST  Result Date: 06/13/2022 CLINICAL DATA:  Soft tissue infection suspected. Cellulitis. Evaluate for drainable pocket. EXAM: CT OF THE LOWER RIGHT EXTREMITY WITHOUT CONTRAST TECHNIQUE: Multidetector CT imaging of the right lower extremity was performed according to the standard protocol. RADIATION DOSE REDUCTION: This exam was performed according to the departmental dose-optimization program which includes automated exposure control,  adjustment of the mA and/or kV according to patient size and/or use of iterative reconstruction technique. COMPARISON:  None Available. FINDINGS: Bones/Joint/Cartilage No evidence of fracture or dislocation. Multiple lucencies in the distal femur as well as scattered lucencies in the tibia suggesting osteopenia. Plate and screw fixation of the medial aspect of the proximal tibia. Ligaments Suboptimally assessed by CT. Muscles and Tendons Generalized muscle atrophy. No intramuscular fluid collection or abscess. Soft tissues Marked skin thickening and subcutaneous soft tissue edema. There are multiple punctate foci of gas in the posterior aspect of the mid to distal calf without evidence of drainable fluid collection or abscess. IMPRESSION: 1. Marked skin thickening and subcutaneous soft tissue edema consistent with cellulitis. No evidence of drainable fluid collection or abscess. 2. Multiple punctate foci of gas in the posterior aspect of the mid to distal calf without evidence of drainable fluid collection or abscess. 3. No  evidence of fracture or dislocation. 4. Multiple lucencies in the distal femur and tibia suggesting osteopenia. Electronically Signed   By: Keane Police D.O.   On: 06/13/2022 23:02   MR TIBIA FIBULA RIGHT W WO CONTRAST  Result Date: 06/13/2022 CLINICAL DATA:  Lower leg soft tissue infection EXAM: MRI OF LOWER RIGHT EXTREMITY WITHOUT AND WITH CONTRAST TECHNIQUE: Multiplanar, multisequence MR imaging of the right tibia/fib was performed both before and after administration of intravenous contrast. CONTRAST:  59m GADAVIST GADOBUTROL 1 MMOL/ML IV SOLN COMPARISON:  Radiographs 06/12/2022 FINDINGS: Bones/Joint/Cartilage No findings of osteomyelitis in the tibia or fibula. Proximal tibia obscured by artifact related to the plate and screw fixator. Specifically, the mid fibular region of concern on radiography appears normal. Ligaments N/A Muscles and Tendons Prominent regional muscular atrophy. No  significant muscular enhancement indicate myositis. Soft tissues Substantial diffuse subcutaneous edema circumferentially in the calf. There is little in the way of associated enhancement and accordingly the appearance is not considered definitive or specific for cellulitis as opposed to other causes of subcutaneous edema. No drainable abscess. IMPRESSION: 1. Substantial diffuse subcutaneous edema in the calf, with little in the way of associated enhancement and accordingly the appearance is not considered definitive or specific for cellulitis as opposed to other causes of subcutaneous edema. No drainable abscess or osteomyelitis. 2. Prominent regional muscular atrophy. Electronically Signed   By: WVan ClinesM.D.   On: 06/13/2022 12:36   DG Tibia/Fibula Right  Result Date: 06/12/2022 CLINICAL DATA:  Wound infection. EXAM: RIGHT TIBIA AND FIBULA - 2 VIEW COMPARISON:  May 12, 2022 FINDINGS: No acute fracture seen. Stable sideplate and screw fixation of the proximal tibia. Mildly sclerotic appearance of the midportion of the fibula. No cortical erosions seen. Soft tissue edema about the leg. IMPRESSION: 1. No acute fracture or dislocation identified about the right tibia or fibula. 2. Mildly sclerotic appearance of the midportion of the fibula, nonspecific. No cortical erosions seen. Appearance is equivocal for osteomyelitis. 3. Soft tissue edema. Electronically Signed   By: DFidela SalisburyM.D.   On: 06/12/2022 17:59   VAS [place]UPLEX DIALYSIS ACCESS (AVF, AVG)  Result Date: 06/01/2022 DIALYSIS ACCESS Patient Name:  MSERAIAH COOK Date of Exam:   05/27/2022 Medical Rec #: [id number]   Accession #:    [id number] of Birth: [birth year]   Patient Gender: F Patient Age:   89years Exam Location:  [facility] Procedure:      VAS [place]UPLEX DIALYSIS ACCESS (AVF, AVG) Referring Phys: GHortencia Pilar--------------------------------------------------------------------------------  Reason for  Exam: First look. Access Site: Left Upper Extremity. Access Type: BrachAx. History: 04/16/2022 New Left Brach Ax AVG. Performing Technologist: TConcha NorwayRVT  Examination Guidelines: A complete evaluation includes B-mode imaging, spectral Doppler, color Doppler, and power Doppler as needed of all accessible portions of each vessel. Unilateral testing is considered an integral part of a complete examination. Limited examinations for reoccurring indications may be performed as noted.  Findings:   +--------------------+----------+-----------------+--------+ AVG                 PSV (cm/s)Flow Vol (mL/min)Describe +--------------------+----------+-----------------+--------+ Native artery inflow   265          1850                +--------------------+----------+-----------------+--------+ Arterial anastomosis   229                              +--------------------+----------+-----------------+--------+  Prox graft             208                              +--------------------+----------+-----------------+--------+ Mid graft               94                              +--------------------+----------+-----------------+--------+ Distal graft           132                              +--------------------+----------+-----------------+--------+ Venous anastomosis     140                              +--------------------+----------+-----------------+--------+ Venous outflow          80                              +--------------------+----------+-----------------+--------+ +---------------+------------+----------+---------+--------+------------------+                  Diameter  Depth (cm)Branching  PSV      Flow Volume                        (cm)                        (cm/s)      (ml/min)      +---------------+------------+----------+---------+--------+------------------+ Left Rad Art                                     27                      Dis                                                                       +---------------+------------+----------+---------+--------+------------------+ Antegrade                                                                +---------------+------------+----------+---------+--------+------------------+  Summary: Patent new left BrachAx AVG with no evidence of stenosis or other abnormalities. This is first look s/p placement.  *See table(s) above for measurements and observations.  Diagnosing physician: Hortencia Pilar MD Electronically signed by Hortencia Pilar MD on 06/01/2022 at 7:22:03 AM.   --------------------------------------------------------------------------------   Final     Microbiology: Results for orders placed or performed during the hospital encounter of 06/12/22  MRSA Next Gen by PCR, Nasal     Status: None   Collection Time: 06/13/22 12:45 AM   Specimen: Nasal Mucosa; Nasal Swab  Result Value Ref Range Status   MRSA by PCR Next Gen NOT DETECTED NOT DETECTED Final    Comment: (NOTE) The GeneXpert MRSA Assay (FDA approved for NASAL specimens only), is one component of a comprehensive MRSA colonization surveillance program. It is not intended to diagnose MRSA infection nor to guide or monitor treatment for MRSA infections. Test performance is not FDA approved in patients less than 98 years old. Performed at [facility], [address] [place], [place] [address]   Culture, blood (Routine X 2) w Reflex to ID Panel     Status: None   Collection Time: 06/13/22  9:34 AM   Specimen: BLOOD  Result Value Ref Range Status   Specimen Description BLOOD BLOOD LEFT HAND  Final   Special Requests   Final    BOTTLES DRAWN AEROBIC AND ANAEROBIC Blood Culture adequate volume   Culture   Final    NO GROWTH 5 DAYS Performed at [facility], [address], [place], [place] [address]    Report Status 06/18/2022 FINAL  Final  Aerobic Culture w Gram Stain (superficial  specimen)     Status: None   Collection Time: 06/13/22  9:35 AM   Specimen: Abscess  Result Value Ref Range Status   Specimen Description   Final    ABSCESS S T Performed at [facility], [address], [place], [place] [address]    Special Requests   Final    NONE Performed at [facility], [address] [place], [place] [address]    Gram Stain   Final    FEW GRAM POSITIVE COCCI IN SINGLES IN PAIRS RARE GRAM VARIABLE ROD NO WBC SEEN Performed at [facility], [place] [address] [place], [place] [address]    Culture   Final    FEW MORGANELLA MORGANII FEW ESCHERICHIA COLI FEW STAPHYLOCOCCUS AUREUS    Report Status 06/17/2022 FINAL  Final   Organism ID, Bacteria MORGANELLA MORGANII  Final   Organism ID, Bacteria ESCHERICHIA COLI  Final   Organism ID, Bacteria STAPHYLOCOCCUS AUREUS  Final      Susceptibility   Escherichia coli - MIC*    AMPICILLIN >=32 RESISTANT Resistant     CEFAZOLIN <=4 SENSITIVE Sensitive     CEFEPIME <=0.12 SENSITIVE Sensitive     CEFTAZIDIME <=1 SENSITIVE Sensitive     CEFTRIAXONE <=0.25 SENSITIVE Sensitive     CIPROFLOXACIN >=4 RESISTANT Resistant     GENTAMICIN <=1 SENSITIVE Sensitive     IMIPENEM <=0.25 SENSITIVE Sensitive     TRIMETH/SULFA >=320 RESISTANT Resistant     AMPICILLIN/SULBACTAM 16 INTERMEDIATE Intermediate     PIP/TAZO <=4 SENSITIVE Sensitive     * FEW ESCHERICHIA COLI   Morganella morganii - MIC*    AMPICILLIN >=32 RESISTANT Resistant     CEFAZOLIN RESISTANT Resistant     CEFTAZIDIME <=1 SENSITIVE Sensitive     CIPROFLOXACIN >=4 RESISTANT Resistant     GENTAMICIN <=1 SENSITIVE Sensitive     IMIPENEM 0.5 SENSITIVE Sensitive     TRIMETH/SULFA >=320 RESISTANT Resistant     AMPICILLIN/SULBACTAM 16 INTERMEDIATE Intermediate     PIP/TAZO <=4 SENSITIVE Sensitive     * FEW MORGANELLA MORGANII   Staphylococcus aureus - MIC*    CIPROFLOXACIN >=8 RESISTANT Resistant     ERYTHROMYCIN >=8 RESISTANT Resistant      GENTAMICIN <=0.5 SENSITIVE Sensitive     OXACILLIN 2 SENSITIVE Sensitive     TETRACYCLINE >=16 RESISTANT Resistant  VANCOMYCIN 1 SENSITIVE Sensitive     TRIMETH/SULFA >=320 RESISTANT Resistant     CLINDAMYCIN <=0.25 SENSITIVE Sensitive     RIFAMPIN <=0.5 SENSITIVE Sensitive     Inducible Clindamycin NEGATIVE Sensitive     * FEW STAPHYLOCOCCUS AUREUS  Culture, blood (Routine X 2) w Reflex to ID Panel     Status: None   Collection Time: 06/13/22  2:23 PM   Specimen: BLOOD  Result Value Ref Range Status   Specimen Description BLOOD BLOOD RIGHT FOREARM  Final   Special Requests   Final    BOTTLES DRAWN AEROBIC AND ANAEROBIC Blood Culture adequate volume   Culture   Final    NO GROWTH 5 DAYS Performed at [facility], [address], [place], [place] [address]    Report Status 06/18/2022 FINAL  Final  Aerobic/Anaerobic Culture w Gram Stain (surgical/deep wound)     Status: None (Preliminary result)   Collection Time: 06/15/22  2:37 PM   Specimen: Leg, Right; Wound  Result Value Ref Range Status   Specimen Description   Final    WOUND Performed at [facility], [address] [place], [place] [address]    Special Requests   Final    RIGHT LEG Performed at [facility], [address] [place], [place] [address]    Gram Stain   Final    FEW WBC PRESENT,BOTH PMN AND MONONUCLEAR RARE GRAM POSITIVE COCCI IN PAIRS Performed at [facility], [place] [address] [place], [place] [address]    Culture   Final    RARE STAPHYLOCOCCUS AUREUS RARE ESCHERICHIA COLI RARE STREPTOCOCCUS AGALACTIAE TESTING AGAINST S. AGALACTIAE NOT ROUTINELY PERFORMED DUE TO PREDICTABILITY OF AMP/PEN/VAN SUSCEPTIBILITY. CULTURE REINCUBATED FOR BETTER GROWTH NO ANAEROBES ISOLATED; CULTURE IN PROGRESS FOR 5 DAYS    Report Status PENDING  Incomplete    Labs: CBC: Recent Labs  Lab 06/12/22 1936 06/13/22 0803 06/14/22 0257 06/15/22 0302 06/16/22 0153 06/17/22 0150  06/17/22 1919 06/18/22 0329  WBC 16.4*   < > 9.8 7.9 11.1* 9.4  --  8.9  NEUTROABS 13.0*  --   --   --   --   --   --   --   HGB 9.4*   < > 7.9* 8.1* 8.4* 6.8* 8.3* 8.0*  HCT 32.0*   < > 26.4* 27.0* 27.7* 22.4* 27.0* 25.8*  MCV 97.0   < > 94.6 93.1 93.0 93.3  --  92.5  PLT 164   < > 186 214 224 241  --  273   < > = values in this interval not displayed.   Basic Metabolic Panel: Recent Labs  Lab 06/14/22 0257 06/15/22 0302 06/16/22 0153 06/17/22 0150 06/18/22 0329  NA 135 135 136 133* 135  K 4.9 3.8 4.9 3.5 3.7  CL 99 98 98 97* 99  CO2 23 28 25 28 27  $ GLUCOSE 122* 152* 247* 270* 200*  BUN 45* 27* 38* 34* 42*  CREATININE 6.28* 3.96* 4.79* 4.10* 4.90*  CALCIUM 8.0* 7.6* 8.0* 7.7* 7.8*  PHOS 4.6  --   --   --   --    Liver Function Tests: No results for input(s): "AST", "ALT", "ALKPHOS", "BILITOT", "PROT", "ALBUMIN" in the last 168 hours. CBG: Recent Labs  Lab 06/17/22 0758 06/17/22 1149 06/17/22 1656 06/17/22 2136 06/18/22 0836  GLUCAP 285* 243* 220* 183* 164*    Discharge time spent: greater than 30 minutes.  Signed: Sharen Hones, MD [facility] 06/18/2022

## 2022-06-18 NOTE — Progress Notes (Signed)
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  Patient presents to the emergency department with increased swelling and pain from right lower extremity.  She has been admitted for Wound infection [T14.8XXA, L08.9] Osteomyelitis of right leg [facility]) [M86.9]  Patient is known to our practice and receives outpatient dialysis treatments at [facility] on a Monday Wednesday Friday schedule, supervised by Dr. Candiss Norse.    Patient seen and evaluated during dialysis   HEMODIALYSIS FLOWSHEET:  Blood Flow Rate (mL/min): 400 mL/min Arterial Pressure (mmHg): -230 mmHg Venous Pressure (mmHg): 110 mmHg TMP (mmHg): 4 mmHg Ultrafiltration Rate (mL/min): 433 mL/min Dialysate Flow Rate (mL/min): 300 ml/min Dialysis Fluid Bolus: Normal Saline Bolus Amount (mL): 300 mL  Tolerating treatment well Complained of discomfort with AVG venous stick.   Objective:  Vital signs in last 24 hours:  Temp:  [97.7 F (36.5 C)-98.9 F (37.2 C)] 98.5 F (36.9 C) (02/09 0855) Pulse Rate:  [70-79] 73 (02/09 1130) Resp:  [14-20] 14 (02/09 1130) BP: (102-131)/(41-58) 128/54 (02/09 1130) SpO2:  [93 %-100 %] 99 % (02/09 1130) Weight:  [142.2 kg] 142.2 kg (02/09 0855)  Weight change:  Filed Weights   06/14/22 1845 06/16/22 1205 06/18/22 0855  Weight: (!) 139.9 kg (!) 138.1 kg (!) 142.2 kg    Intake/Output: I/O last 3 completed shifts: In: 727.7 [P.O.:180; I.V.:117.7; Blood:330; IV Piggyback:100] Out: 50 [Drains:50]   Intake/Output this shift:  Total I/O In: 100 [IV Piggyback:100] Out: -   Physical Exam: General: NAD  Head: Normocephalic, atraumatic. Moist oral mucosal membranes  Eyes: Anicteric  Lungs:  Clear to auscultation, normal effort, room air   Heart: Regular rate and rhythm  Abdomen:  Soft, nontender, obese  Extremities: No peripheral edema.  Neurologic: Alert and oriented, moving all four extremities  Skin: No lesions  Access: Left AVG, Rt permcath    Basic Metabolic Panel: Recent Labs  Lab 06/14/22 0257 06/15/22 0302 06/16/22 0153 06/17/22 0150 06/18/22 0329  NA 135 135 136 133* 135  K 4.9 3.8 4.9 3.5 3.7  CL 99 98 98 97* 99  CO2 23 28 25 28 27  $ GLUCOSE 122* 152* 247* 270* 200*  BUN 45* 27* 38* 34* 42*  CREATININE 6.28* 3.96* 4.79* 4.10* 4.90*  CALCIUM 8.0* 7.6* 8.0* 7.7* 7.8*  PHOS 4.6  --   --   --   --      Liver Function Tests: No results for input(s): "AST", "ALT", "ALKPHOS", "BILITOT", "PROT", "ALBUMIN" in the last 168 hours. No results for input(s): "LIPASE", "AMYLASE" in the last 168 hours. No results for input(s): "AMMONIA" in the last 168 hours.  CBC: Recent Labs  Lab 06/12/22 1936 06/13/22 0803 06/14/22 0257 06/15/22 0302 06/16/22 0153 06/17/22 0150 06/17/22 1919 06/18/22 0329  WBC 16.4*   < > 9.8 7.9 11.1* 9.4  --  8.9  NEUTROABS 13.0*  --   --   --   --   --   --   --   HGB 9.4*   < > 7.9* 8.1* 8.4* 6.8* 8.3* 8.0*  HCT 32.0*   < > 26.4* 27.0* 27.7* 22.4* 27.0* 25.8*  MCV 97.0   < > 94.6 93.1 93.0 93.3  --  92.5  PLT 164   < > 186 214 224 241  --  273   < > =  values in this interval not displayed.     Cardiac Enzymes: No results for input(s): "CKTOTAL", "CKMB", "CKMBINDEX", "TROPONINI" in the last 168 hours.  BNP: Invalid input(s): "POCBNP"  CBG: Recent Labs  Lab 06/17/22 0758 06/17/22 1149 06/17/22 1656 06/17/22 2136 06/18/22 0836  GLUCAP 285* 243* 220* 183* 164*     Microbiology: Results for orders placed or performed during the hospital encounter of 06/12/22  MRSA Next Gen by PCR, Nasal     Status: None   Collection Time: 06/13/22 12:45 AM   Specimen: Nasal Mucosa; Nasal Swab  Result Value Ref Range Status   MRSA by PCR Next Gen NOT DETECTED NOT DETECTED Final     Comment: (NOTE) The GeneXpert MRSA Assay (FDA approved for NASAL specimens only), is one component of a comprehensive MRSA colonization surveillance program. It is not intended to diagnose MRSA infection nor to guide or monitor treatment for MRSA infections. Test performance is not FDA approved in patients less than 71 years old. Performed at [facility], [address] [place], [place] [address]   Culture, blood (Routine X 2) w Reflex to ID Panel     Status: None   Collection Time: 06/13/22  9:34 AM   Specimen: BLOOD  Result Value Ref Range Status   Specimen Description BLOOD BLOOD LEFT HAND  Final   Special Requests   Final    BOTTLES DRAWN AEROBIC AND ANAEROBIC Blood Culture adequate volume   Culture   Final    NO GROWTH 5 DAYS Performed at [facility], [address], [place], [place] [address]    Report Status 06/18/2022 FINAL  Final  Aerobic Culture w Gram Stain (superficial specimen)     Status: None   Collection Time: 06/13/22  9:35 AM   Specimen: Abscess  Result Value Ref Range Status   Specimen Description   Final    ABSCESS S T Performed at [facility], [address] [place], [place] [address]    Special Requests   Final    NONE Performed at [facility], [address] [place], [place] [address]    Gram Stain   Final    FEW GRAM POSITIVE COCCI IN SINGLES IN PAIRS RARE GRAM VARIABLE ROD NO WBC SEEN Performed at [facility], [place] [address] [place], [place] [address]    Culture   Final    FEW MORGANELLA MORGANII FEW ESCHERICHIA COLI FEW STAPHYLOCOCCUS AUREUS    Report Status 06/17/2022 FINAL  Final   Organism ID, Bacteria MORGANELLA MORGANII  Final   Organism ID, Bacteria ESCHERICHIA COLI  Final   Organism ID, Bacteria STAPHYLOCOCCUS AUREUS  Final      Susceptibility   Escherichia coli - MIC*    AMPICILLIN >=32 RESISTANT Resistant     CEFAZOLIN <=4 SENSITIVE Sensitive     CEFEPIME <=0.12 SENSITIVE  Sensitive     CEFTAZIDIME <=1 SENSITIVE Sensitive     CEFTRIAXONE <=0.25 SENSITIVE Sensitive     CIPROFLOXACIN >=4 RESISTANT Resistant     GENTAMICIN <=1 SENSITIVE Sensitive     IMIPENEM <=0.25 SENSITIVE Sensitive     TRIMETH/SULFA >=320 RESISTANT Resistant     AMPICILLIN/SULBACTAM 16 INTERMEDIATE Intermediate     PIP/TAZO <=4 SENSITIVE Sensitive     * FEW ESCHERICHIA COLI   Morganella morganii - MIC*    AMPICILLIN >=32 RESISTANT Resistant     CEFAZOLIN RESISTANT Resistant     CEFTAZIDIME <=1 SENSITIVE Sensitive     CIPROFLOXACIN >=4 RESISTANT  Resistant     GENTAMICIN <=1 SENSITIVE Sensitive     IMIPENEM 0.5 SENSITIVE Sensitive     TRIMETH/SULFA >=320 RESISTANT Resistant     AMPICILLIN/SULBACTAM 16 INTERMEDIATE Intermediate     PIP/TAZO <=4 SENSITIVE Sensitive     * FEW MORGANELLA MORGANII   Staphylococcus aureus - MIC*    CIPROFLOXACIN >=8 RESISTANT Resistant     ERYTHROMYCIN >=8 RESISTANT Resistant     GENTAMICIN <=0.5 SENSITIVE Sensitive     OXACILLIN 2 SENSITIVE Sensitive     TETRACYCLINE >=16 RESISTANT Resistant     VANCOMYCIN 1 SENSITIVE Sensitive     TRIMETH/SULFA >=320 RESISTANT Resistant     CLINDAMYCIN <=0.25 SENSITIVE Sensitive     RIFAMPIN <=0.5 SENSITIVE Sensitive     Inducible Clindamycin NEGATIVE Sensitive     * FEW STAPHYLOCOCCUS AUREUS  Culture, blood (Routine X 2) w Reflex to ID Panel     Status: None   Collection Time: 06/13/22  2:23 PM   Specimen: BLOOD  Result Value Ref Range Status   Specimen Description BLOOD BLOOD RIGHT FOREARM  Final   Special Requests   Final    BOTTLES DRAWN AEROBIC AND ANAEROBIC Blood Culture adequate volume   Culture   Final    NO GROWTH 5 DAYS Performed at [facility], [address], [place], [place] [address]    Report Status 06/18/2022 FINAL  Final  Aerobic/Anaerobic Culture w Gram Stain (surgical/deep wound)     Status: None (Preliminary result)   Collection Time: 06/15/22  2:37 PM   Specimen: Leg, Right;  Wound  Result Value Ref Range Status   Specimen Description   Final    WOUND Performed at [facility], [address], [place], [place] [address]    Special Requests   Final    RIGHT LEG Performed at [facility], [address] [place], [place] [address]    Gram Stain   Final    FEW WBC PRESENT,BOTH PMN AND MONONUCLEAR RARE GRAM POSITIVE COCCI IN PAIRS Performed at [facility], [place] [address], [place], [place] [address]    Culture   Final    RARE STAPHYLOCOCCUS AUREUS RARE ESCHERICHIA COLI RARE STREPTOCOCCUS AGALACTIAE TESTING AGAINST S. AGALACTIAE NOT ROUTINELY PERFORMED DUE TO PREDICTABILITY OF AMP/PEN/VAN SUSCEPTIBILITY. CULTURE REINCUBATED FOR BETTER GROWTH NO ANAEROBES ISOLATED; CULTURE IN PROGRESS FOR 5 DAYS    Report Status PENDING  Incomplete    Coagulation Studies: No results for input(s): "LABPROT", "INR" in the last 72 hours.  Urinalysis: No results for input(s): "COLORURINE", "LABSPEC", "PHURINE", "GLUCOSEU", "HGBUR", "BILIRUBINUR", "KETONESUR", "PROTEINUR", "UROBILINOGEN", "NITRITE", "LEUKOCYTESUR" in the last 72 hours.  Invalid input(s): "APPERANCEUR"    Imaging: No results found.   Medications:    anticoagulant sodium citrate     ceFEPime (MAXIPIME) IV     vancomycin 1,000 mg (06/16/22 1558)    apixaban  5 mg Oral Q12H   atorvastatin  80 mg Oral QHS   calcitRIOL  0.25 mcg Oral Daily   carvedilol  25 mg Oral BID   Chlorhexidine Gluconate Cloth  6 each Topical Daily   ferrous sulfate  325 mg Oral Daily   folic acid  1 mg Oral Daily   gabapentin  200 mg Oral BID   insulin aspart  0-9 Units Subcutaneous TID WC   insulin aspart  2 Units Subcutaneous TID WC   insulin glargine-yfgn  15 Units Subcutaneous QHS   leptospermum manuka honey  1 Application Topical Daily   polyethylene  glycol  34 g Oral Daily   torsemide  10 mg Oral Daily   acetaminophen **OR** acetaminophen, albuterol, alteplase, anticoagulant sodium citrate,  Glycerin (Adult), heparin, HYDROcodone-acetaminophen, lidocaine (PF), lidocaine-prilocaine, ondansetron **OR** ondansetron (ZOFRAN) IV, oxyCODONE-acetaminophen, pentafluoroprop-tetrafluoroeth, traZODone  Assessment/ Plan:  Ms. Adiba Montanaro is a 72 y.o.  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  Patient presents to the emergency department with increased swelling and pain from right lower extremity.  She has been admitted for Wound infection [T14.8XXA, L08.9] Osteomyelitis of right leg (HCC) [M86.9]   End-stage renal disease on hemodialysis. Patient receiving dialysis today, UF 0.5L as tolerated. Will continue antibiotics with dialysis as described below.   2. Anemia of chronic kidney disease Lab Results  Component Value Date   HGB 8.0 (L) 06/18/2022    Patient receives Mircera at outpatient clinic.  Hgb improved with blood transfusion yesterday.   3. Secondary Hyperparathyroidism: with outpatient labs: PTH 463, phosphorus 6.8, calcium 8.4 on 05/17/22.    Lab Results  Component Value Date   CALCIUM 7.8 (L) 06/18/2022   CAION 1.13 (L) 04/16/2022   PHOS 4.6 06/14/2022    Currently prescribed calcitriol, cholecalciferol, and calcium acetate outpatient. Continue calcitriol daily.  4. Diabetes mellitus type II with chronic kidney disease/renal manifestations: insulin dependent. Home regimen includes Lantus.   Sliding scale insulin managed by primary team.  5. Cellulitis and abscess of right leg, Imaging negative for osteomyelitis. I&D competed on 06/15/22 with NPWV placed. Remains on daily cefepime and Vanc given with dialysis.  ID recommending cefepime 2g/2g/2g with dialysis until 06/29/22.     LOS: [place] [place] 2/9/202411:54 AM

## 2022-06-19 DIAGNOSIS — N186 End stage renal disease: Secondary | ICD-10-CM | POA: Diagnosis not present

## 2022-06-19 DIAGNOSIS — N189 Chronic kidney disease, unspecified: Secondary | ICD-10-CM | POA: Diagnosis not present

## 2022-06-19 DIAGNOSIS — S81801A Unspecified open wound, right lower leg, initial encounter: Secondary | ICD-10-CM | POA: Diagnosis not present

## 2022-06-19 DIAGNOSIS — L03115 Cellulitis of right lower limb: Secondary | ICD-10-CM | POA: Diagnosis not present

## 2022-06-19 LAB — GLUCOSE, CAPILLARY
Glucose-Capillary: 160 mg/dL — ABNORMAL HIGH (ref 70–99)
Glucose-Capillary: 149 mg/dL — ABNORMAL HIGH (ref 70–99)
Glucose-Capillary: 135 mg/dL — ABNORMAL HIGH (ref 70–99)
Glucose-Capillary: 135 mg/dL — ABNORMAL HIGH (ref 70–99)

## 2022-06-19 LAB — CBC
HCT: 24.7 % — ABNORMAL LOW (ref 36.0–46.0)
Hemoglobin: 7.6 g/dL — ABNORMAL LOW (ref 12.0–15.0)
MCH: 28.7 pg (ref 26.0–34.0)
MCHC: 30.8 g/dL (ref 30.0–36.0)
MCV: 93.2 fL (ref 80.0–100.0)
Platelets: 313 10*3/uL (ref 150–400)
RBC: 2.65 MIL/uL — ABNORMAL LOW (ref 3.87–5.11)
RDW: 15.4 % (ref 11.5–15.5)
WBC: 8.7 10*3/uL (ref 4.0–10.5)
nRBC: 0 % (ref 0.0–0.2)

## 2022-06-19 LAB — VITAMIN B12: Vitamin B-12: 645 pg/mL (ref 180–914)

## 2022-06-19 MED ORDER — SODIUM CHLORIDE 0.9 % IV SOLN
300.0000 mg | Freq: Once | INTRAVENOUS | Status: AC
  Administered 2022-06-19: 300 mg via INTRAVENOUS
  Filled 2022-06-19: qty 300

## 2022-06-19 NOTE — Progress Notes (Signed)
Progress Note   Patient: Jody Nelson [id number] DOB: [birth year] DOA: 06/12/2022     7 DOS: the patient was seen and examined on 06/19/2022   Brief hospital course: Jody Nelson is a 72 y.o. 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  Patient initially was placed on vancomycin and meropenem after seen by ID.  Patient also had a debridement performed 02/06, a wound VAC was placed.  Antibiotic switched to cefepime and vancomycin today by ID, which she can be continued through dialysis.   Principal Problem:   Cellulitis and abscess of right leg Active Problems:   Non-healing wound of right lower extremity   Lymphedema of both lower extremities   Uncontrolled type 2 diabetes mellitus with hyperglycemia, with long-term current use of insulin (HCC)   ESRD on hemodialysis (HCC)   Chronic anticoagulation   Hypercoagulable state (positive anticardiolipin, elevated homocysteine, factor V Leiden deficiency)   (HFpEF) heart failure with preserved ejection fraction (HCC)   H/O: CVA (cerebrovascular accident)   History of obstructive sleep apnea   Essential hypertension   Morbid obesity with BMI of 50.0-59.9, adult (HCC)   Sarcoidosis   Anemia of chronic renal failure   Decubitus ulcer of heel, bilateral   Wound infection   Multiple open wounds of lower leg   Assessment  and Plan: Cellulitis and abscess of right leg below to multi bacterial infection Nonhealing wound right shin/possible osteomyelitis History of right tibial plateau fracture 2019 with hardware and chronic wound infection Chronic lymphedema Status post I&D with wound VAC.  Appreciate ID consult.  Antibiotic switched to cefepime and vancomycin.   Wound culture final result showed E. coli, MSSA,.  Morganella morganii.  Discussed with ID, patient will finish antibiotics with IV cefepime after each dialysis. Patient could not be discharged yesterday due to the fact that the patient could not set up wound care in the nursing home.   Uncontrolled type 2 diabetes mellitus with hyperglycemia, with long-term current use of insulin (HCC) Continue current regimen.   ESRD on hemodialysis [facility]) Hemodialysis per nephrology.   Chronic anticoagulation Hypercoagulability 2020 (positive anticardiolipin, elevated homocysteine, factor V Leiden deficiency) h/o multiple cryptogenic CVAs Continue Eliquis.   Decubitus ulcer of heel, bilateral Wound care   Anemia of chronic renal failure Iron deficient anemia. Patient required 1 unit PRBC transfusion.  Hemoglobin still low today, patient has some iron deficiency, also check a B12 level, give IV iron.   Sarcoidosis Biopsy proven     Morbid obesity with BMI of 50.0-59.9, adult ([place]) Continue to follow, diet exercise advised.   Essential hypertension Continue home medicines.   History of obstructive sleep apnea CPAP nightly   H/O: CVA (cerebrovascular accident) PT will evaluate patient.   (HFpEF) heart failure with preserved ejection fraction (HCC) Condition stable.      Subjective:  Patient does not have any complaint today.  Physical Exam: Vitals:   06/18/22 1252  06/18/22 1315 06/18/22 2308 06/19/22 0808  BP:  (!) 147/56 (!) 146/44 (!) 147/52  Pulse:  76 85 74  Resp:  17 18 17  $ Temp:  98.1 F (36.7 C) 98.3 F (36.8 C) 98 F (36.7 C)   TempSrc:      SpO2:   100% 100%  Weight: (!) 141.3 kg     Height:       General exam: Appears calm and comfortable, morbidly obese. Respiratory system: Clear to auscultation. Respiratory effort normal. Cardiovascular system: S1 & S2 heard, RRR. No JVD, murmurs, rubs, gallops or clicks.  Gastrointestinal system: Abdomen is nondistended, soft and nontender. No organomegaly or masses felt. Normal bowel sounds heard. Central nervous system: Alert and oriented. No focal neurological deficits. Extremities: Right leg edema. Skin: No rashes, lesions or ulcers Psychiatry: Judgement and insight appear normal. Mood & affect appropriate.    Data Reviewed:  Lab results reviewed.  Family Communication: None  Disposition: Status is: Inpatient Remains inpatient appropriate because: Unsafe discharge.     Time spent: 35 minutes  Author: Sharen Hones, MD 06/19/2022 10:34 AM  For on call review [email or web address].

## 2022-06-19 NOTE — Progress Notes (Signed)
Subjective: 4 Days Post-Op Procedure(s) (LRB): IRRIGATION AND DEBRIDEMENT WOUND (Right) APPLICATION OF WOUND VAC (Right) Patient reports pain as mild to moderate.  Feels improvement in her pain level. Patient is well, and has had no acute complaints or problems.  Less pain and tightness. Plan is to go Rehab after hospital stay. Negative for chest pain and shortness of breath Fever: no Gastrointestinal: Negative for nausea and vomiting  Objective: Vital signs in last 24 hours: Temp:  [98.1 F (36.7 C)-98.5 F (36.9 C)] 98.3 F (36.8 C) (02/09 2308) Pulse Rate:  [72-85] 85 (02/09 2308) Resp:  [11-28] 18 (02/09 2308) BP: (121-147)/(44-58) 146/44 (02/09 2308) SpO2:  [98 %-100 %] 100 % (02/09 2308) Weight:  [141.3 kg-142.2 kg] 141.3 kg (02/09 1252)  Intake/Output from previous day:  Intake/Output Summary (Last 24 hours) at 06/19/2022 0744 Last data filed at 06/18/2022 1602 Gross per 24 hour  Intake 200 ml  Output 500 ml  Net -300 ml    Intake/Output this shift: No intake/output data recorded.  Labs: Recent Labs    06/17/22 0150 06/17/22 1919 06/18/22 0329  HGB 6.8* 8.3* 8.0*   Recent Labs    06/17/22 0150 06/17/22 1919 06/18/22 0329  WBC 9.4  --  8.9  RBC 2.40*  --  2.79*  HCT 22.4* 27.0* 25.8*  PLT 241  --  273   Recent Labs    06/17/22 0150 06/18/22 0329  NA 133* 135  K 3.5 3.7  CL 97* 99  CO2 28 27  BUN 34* 42*  CREATININE 4.10* 4.90*  GLUCOSE 270* 200*  CALCIUM 7.7* 7.8*   No results for input(s): "LABPT", "INR" in the last 72 hours.   EXAM General - Patient is Alert and Oriented Extremity - Sensation intact distally Dorsiflexion/Plantar flexion intact Compartment soft Dressing/Incision - clean, dry, with the wound VAC in place.  Padded boot is in place.  Less edema. Motor Function - intact, moving foot and toes well on exam.  Ankle plantarflexion dorsiflexion intact.  Past Medical History:  Diagnosis Date   (HFpEF) heart failure with  preserved ejection fraction ([place])    a.) TTE 10/12/2012: EF >55%, triv PR, G1DD; b.) TTE 10/18/2012: EF >55%, LVH, LAE, triv MT/TR/PR; c.) TTE 06/27/2014: EF >55%, mild MAC, G1DD; d.) TTE 03/18/2017: EF 60-65%, LAE, AoV sclerosis, G1DD; e.) TTE 07/13/2016: EF >55%, LVH, triv MR/TR; f.) TTE 01/17/2019: EF >55%, LVH, GLS -18.1%, triv PR   Anemia of chronic renal failure    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   At high risk for falls    Atypical chest pain    Bilateral lower extremity edema    Chronic right sided weakness    Cryptogenic stroke ([place]) 02/27/2011   a.) MRI brain 02/27/2011: old lacunar infarct in RIGHT pons and medulla oblongata   Cryptogenic stroke ([place]) 10/11/2012   a.) CT brain 10/11/2012: tiny old lacunar infarct in head of caudate nucleus (left)   Cryptogenic stroke ([place]) 10/12/2012   a.) MRI brain 10/12/2012: multiple acute LEFT hemispheric infarcts   Cryptogenic stroke ([place]) 06/26/2014   a.) MRI brain 06/26/2014: acute RIGHT posterior frontal lobe infarct involving both and grey matter   Cryptogenic stroke ([place]) 07/29/2015   a.) MRI brain 07/29/2015: 2 foci of diffusion restriction in the LEFT parietal lobe consistent with acute infarction   Cryptogenic stroke ([place]) 07/24/2017   a.) MRI brain 07/24/2017: punctate focus of acute ischemia in the medial LEFT temporal lobe   DDD (degenerative disc disease), cervical  Depression    DOE (dyspnea on exertion)    Dysarthria as late effect of stroke    ESRD (end stage renal disease) on dialysis ([place])    a.) M-W-F   GERD (gastroesophageal reflux disease)    Hallux valgus, bilateral    Heterozygous factor V Leiden mutation ([place]) 07/18/2018   History of cardiac catheterization 08/30/2008   a.) LHC 08/30/2008: EF >60%, LVEDP 18 mmHg, normal coronaries.   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   a.) 07/18/2018 --> 22 mol/L   Hypertension    Insomnia    a.) melatonin + trazodone PRN   Long term current use of anticoagulant    a.) apixaban   Lymphedema of both lower extremities    Meralgia paresthetica, left lower limb    OSA (obstructive sleep apnea)    a.) does not require nocturnal PAP therapy   Osteoarthritis    Pneumonia    Pseudophakia of both eyes    Sarcoidosis    Secondary hyperparathyroidism of renal origin ([place])    Type 2 diabetes mellitus treated with insulin ([place])    Vitamin D deficiency     Assessment/Plan: 4 Days Post-Op Procedure(s) (LRB): IRRIGATION AND DEBRIDEMENT WOUND (Right) APPLICATION OF WOUND VAC (Right) Principal Problem:   Cellulitis and abscess of right leg Active Problems:   (HFpEF) heart failure with preserved ejection fraction (HCC)   Lymphedema of both lower extremities   H/O: CVA (cerebrovascular accident)   History of obstructive sleep apnea   Essential hypertension   Morbid obesity with BMI of 50.0-59.9, adult ([place])   Sarcoidosis   Uncontrolled type 2 diabetes mellitus with hyperglycemia, with long-term current use of insulin (HCC)   ESRD on hemodialysis (HCC)   Anemia of chronic renal failure   Non-healing wound of right lower extremity   Chronic anticoagulation   Decubitus ulcer of heel, bilateral   Hypercoagulable state (positive anticardiolipin, elevated homocysteine, factor V Leiden deficiency)   Wound infection   Multiple open wounds of lower leg  Estimated body mass index is 53.47 kg/m as calculated from the following:   Height as of this encounter: 5' 4"$  (1.626 m).   Weight as of this encounter: 141.3 kg. Advance diet Up with therapy  Continue IV antibiotics.  Appreciate wound care and infectious disease involvement.  Wound VAC was changed yesterday with ankle padding.  Plan for  Wound VAC to be changed Monday by wound care team, if the patient has not discharged to rehab.  DVT Prophylaxis -  Eliquis Weight-Bearing as tolerated to right leg  Reche Dixon, PA-C Orthopaedic Surgery 06/19/2022, 7:44 AM

## 2022-06-19 NOTE — Progress Notes (Signed)
[facility]  PROGRESS NOTE   Subjective:   Seen at bedside comfortable.  Had stable dialysis on Friday.  Objective:  Vital signs: Blood pressure (!) 147/52, pulse 74, temperature 98 F (36.7 C), temperature source Oral, resp. rate 17, height 5' 4"$  (1.626 m), weight (!) 141.3 kg, SpO2 100 %.  Intake/Output Summary (Last 24 hours) at 06/19/2022 1328 Last data filed at 06/19/2022 1000 Gross per 24 hour  Intake 100 ml  Output 100 ml  Net 0 ml   Filed Weights   06/16/22 1205 06/18/22 0855 06/18/22 1252  Weight: (!) 138.1 kg (!) 142.2 kg (!) 141.3 kg     Physical Exam: General:  No acute distress  Head:  Normocephalic, atraumatic. Moist oral mucosal membranes  Eyes:  Anicteric  Neck:  Supple  Lungs:   Clear to auscultation, normal effort  Heart:  S1S2 no rubs  Abdomen:   Soft, nontender, bowel sounds present  Extremities:  peripheral edema.  Neurologic:  Awake, alert, following commands  Skin:  No lesions  Access:     Basic Metabolic Panel: Recent Labs  Lab 06/14/22 0257 06/15/22 0302 06/16/22 0153 06/17/22 0150 06/18/22 0329  NA 135 135 136 133* 135  K 4.9 3.8 4.9 3.5 3.7  CL 99 98 98 97* 99  CO2 23 28 25 28 27  $ GLUCOSE 122* 152* 247* 270* 200*  BUN 45* 27* 38* 34* 42*  CREATININE 6.28* 3.96* 4.79* 4.10* 4.90*  CALCIUM 8.0* 7.6* 8.0* 7.7* 7.8*  PHOS 4.6  --   --   --   --    GFR: Estimated Creatinine Clearance: 15.3 mL/min (A) (by C-G formula based on SCr of 4.9 mg/dL (H)).  Liver Function Tests: No results for input(s): "AST", "ALT", "ALKPHOS", "BILITOT", "PROT", "ALBUMIN" in the last 168 hours. No results for input(s): "LIPASE", "AMYLASE" in the last 168 hours. No results for input(s): "AMMONIA" in the last 168 hours.  CBC: Recent Labs  Lab 06/12/22 1936 06/13/22 0803 06/15/22 0302 06/16/22 0153 06/17/22 0150 06/17/22 1919 06/18/22 0329 06/19/22 0741  WBC 16.4*   < > 7.9 11.1* 9.4  --  8.9 8.7  NEUTROABS 13.0*  --   --   --   --    --   --   --   HGB 9.4*   < > 8.1* 8.4* 6.8* 8.3* 8.0* 7.6*  HCT 32.0*   < > 27.0* 27.7* 22.4* 27.0* 25.8* 24.7*  MCV 97.0   < > 93.1 93.0 93.3  --  92.5 93.2  PLT 164   < > 214 224 241  --  273 313   < > = values in this interval not displayed.     HbA1C: Hgb A1c MFr Bld  Date/Time Value Ref Range Status  06/13/2022 08:03 AM 8.7 (H) 4.8 - 5.6 % Final    Comment:    (NOTE) Pre diabetes:          5.7%-6.4%  Diabetes:              >6.4%  Glycemic control for   <7.0% adults with diabetes     Urinalysis: No results for input(s): "COLORURINE", "LABSPEC", "PHURINE", "GLUCOSEU", "HGBUR", "BILIRUBINUR", "KETONESUR", "PROTEINUR", "UROBILINOGEN", "NITRITE", "LEUKOCYTESUR" in the last 72 hours.  Invalid input(s): "APPERANCEUR"    Imaging: No results found.   Medications:    anticoagulant sodium citrate     ceFEPime (MAXIPIME) IV Stopped (06/18/22 1420)    apixaban  5 mg Oral Q12H   atorvastatin  80 mg Oral QHS   calcitRIOL  0.25 mcg Oral Daily   carvedilol  25 mg Oral BID   Chlorhexidine Gluconate Cloth  6 each Topical Daily   ferrous sulfate  325 mg Oral Daily   folic acid  1 mg Oral Daily   gabapentin  200 mg Oral BID   insulin aspart  0-9 Units Subcutaneous TID WC   insulin aspart  2 Units Subcutaneous TID WC   insulin glargine-yfgn  15 Units Subcutaneous QHS   leptospermum manuka honey  1 Application Topical Daily   polyethylene glycol  34 g Oral Daily   torsemide  10 mg Oral Daily    Assessment/ Plan:      72 year old female with past medical conditions including anemia, diabetes type 2, 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  Patient presents to the emergency department with increased swelling and pain from right lower extremity.  She has been admitted for Wound infection [T14.8XXA, L08.9] Osteomyelitis of right leg (HCC) [M86.9]  #1: End-stage renal disease: Patient is  stable dialysis on Friday.  Will continue to monitor closely.  #2: Anemia: Patient has been on oral iron supplementation along with erythropoietin.  #3: Secondary hyperparathyroidism: She has been on calcitriol.  Will monitor PTH, calcium and phosphorus levels.  #4: Cellulitis of the right leg: She has been on cefepime.  Patient is being followed by vascular.  #5: Diabetes: Will continue the insulin protocol.  #6: Hypertension: Better controlled.  Will continue torsemide and also carvedilol.   LOS: [place], [facility] 2/10/20241:28 PM

## 2022-06-19 NOTE — Plan of Care (Signed)
  Problem: Clinical Measurements: Goal: Ability to avoid or minimize complications of infection will improve Outcome: Progressing   Problem: Skin Integrity: Goal: Skin integrity will improve Outcome: Progressing   Problem: Clinical Measurements: Goal: Ability to maintain clinical measurements within normal limits will improve Outcome: Progressing   Problem: Activity: Goal: Risk for activity intolerance will decrease Outcome: Progressing   Problem: Nutrition: Goal: Adequate nutrition will be maintained Outcome: Progressing   Problem: Pain Managment: Goal: General experience of comfort will improve Outcome: Progressing   Problem: Safety: Goal: Ability to remain free from injury will improve Outcome: Progressing

## 2022-06-20 DIAGNOSIS — L03115 Cellulitis of right lower limb: Secondary | ICD-10-CM | POA: Diagnosis not present

## 2022-06-20 DIAGNOSIS — N186 End stage renal disease: Secondary | ICD-10-CM | POA: Diagnosis not present

## 2022-06-20 DIAGNOSIS — T148XXA Other injury of unspecified body region, initial encounter: Secondary | ICD-10-CM | POA: Diagnosis not present

## 2022-06-20 DIAGNOSIS — S81801A Unspecified open wound, right lower leg, initial encounter: Secondary | ICD-10-CM | POA: Diagnosis not present

## 2022-06-20 LAB — CBC
HCT: 24.7 % — ABNORMAL LOW (ref 36.0–46.0)
Hemoglobin: 7.6 g/dL — ABNORMAL LOW (ref 12.0–15.0)
MCH: 28.3 pg (ref 26.0–34.0)
MCHC: 30.8 g/dL (ref 30.0–36.0)
MCV: 91.8 fL (ref 80.0–100.0)
Platelets: 290 10*3/uL (ref 150–400)
RBC: 2.69 MIL/uL — ABNORMAL LOW (ref 3.87–5.11)
RDW: 15.1 % (ref 11.5–15.5)
WBC: 7.7 10*3/uL (ref 4.0–10.5)
nRBC: 0 % (ref 0.0–0.2)

## 2022-06-20 LAB — GLUCOSE, CAPILLARY
Glucose-Capillary: 135 mg/dL — ABNORMAL HIGH (ref 70–99)
Glucose-Capillary: 135 mg/dL — ABNORMAL HIGH (ref 70–99)
Glucose-Capillary: 187 mg/dL — ABNORMAL HIGH (ref 70–99)
Glucose-Capillary: 191 mg/dL — ABNORMAL HIGH (ref 70–99)
Glucose-Capillary: 175 mg/dL — ABNORMAL HIGH (ref 70–99)

## 2022-06-20 NOTE — Progress Notes (Signed)
Subjective: 5 Days Post-Op Procedure(s) (LRB): IRRIGATION AND DEBRIDEMENT WOUND (Right) APPLICATION OF WOUND VAC (Right) Patient reports pain as mild to moderate.  Feels improvement in her pain level. Patient is well, and has had no acute complaints or problems.  Less pain and tightness. Plan is to go Rehab after hospital stay. Negative for chest pain and shortness of breath Fever: no Gastrointestinal: Negative for nausea and vomiting  Objective: Vital signs in last 24 hours: Temp:  [98 F (36.7 C)-98.2 F (36.8 C)] 98.2 F (36.8 C) (02/10 2344) Pulse Rate:  [74-79] 78 (02/10 2344) Resp:  [17-18] 18 (02/10 2344) BP: (126-149)/(38-52) 149/40 (02/10 2344) SpO2:  [100 %] 100 % (02/10 2344)  Intake/Output from previous day:  Intake/Output Summary (Last 24 hours) at 06/20/2022 0706 Last data filed at 06/20/2022 0530 Gross per 24 hour  Intake 650 ml  Output 200 ml  Net 450 ml    Intake/Output this shift: No intake/output data recorded.  Labs: Recent Labs    06/17/22 1919 06/18/22 0329 06/19/22 0741 06/20/22 0331  HGB 8.3* 8.0* 7.6* 7.6*   Recent Labs    06/19/22 0741 06/20/22 0331  WBC 8.7 7.7  RBC 2.65* 2.69*  HCT 24.7* 24.7*  PLT 313 290   Recent Labs    06/18/22 0329  NA 135  K 3.7  CL 99  CO2 27  BUN 42*  CREATININE 4.90*  GLUCOSE 200*  CALCIUM 7.8*   No results for input(s): "LABPT", "INR" in the last 72 hours.   EXAM General - Patient is Alert and Oriented Extremity - Sensation intact distally Dorsiflexion/Plantar flexion intact Compartment soft Dressing/Incision - clean, dry, with the wound VAC in place.  Padded boot is in place.  Less edema. Motor Function - intact, moving foot and toes well on exam.  Ankle plantarflexion dorsiflexion intact.  Past Medical History:  Diagnosis Date   (HFpEF) heart failure with preserved ejection fraction ([place])    a.) TTE 10/12/2012: EF >55%, triv PR, G1DD; b.) TTE 10/18/2012: EF >55%, LVH, LAE, triv  MT/TR/PR; c.) TTE 06/27/2014: EF >55%, mild MAC, G1DD; d.) TTE 03/18/2017: EF 60-65%, LAE, AoV sclerosis, G1DD; e.) TTE 07/13/2016: EF >55%, LVH, triv MR/TR; f.) TTE 01/17/2019: EF >55%, LVH, GLS -18.1%, triv PR   Anemia of chronic renal failure    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   At high risk for falls    Atypical chest pain    Bilateral lower extremity edema    Chronic right sided weakness    Cryptogenic stroke ([place]) 02/27/2011   a.) MRI brain 02/27/2011: old lacunar infarct in RIGHT pons and medulla oblongata   Cryptogenic stroke ([place]) 10/11/2012   a.) CT brain 10/11/2012: tiny old lacunar infarct in head of caudate nucleus (left)   Cryptogenic stroke ([place]) 10/12/2012   a.) MRI brain 10/12/2012: multiple acute LEFT hemispheric infarcts   Cryptogenic stroke ([place]) 06/26/2014   a.) MRI brain 06/26/2014: acute RIGHT posterior frontal lobe infarct involving both and grey matter   Cryptogenic stroke ([place]) 07/29/2015   a.) MRI brain 07/29/2015: 2 foci of diffusion restriction in the LEFT parietal lobe consistent with acute infarction   Cryptogenic stroke ([place]) 07/24/2017   a.) MRI brain 07/24/2017: punctate focus of acute ischemia in the medial LEFT temporal lobe   DDD (degenerative disc disease), cervical    Depression    DOE (dyspnea on exertion)    Dysarthria as late effect of stroke    ESRD (end stage renal disease) on dialysis ([place])  a.) M-W-F   GERD (gastroesophageal reflux disease)    Hallux valgus, bilateral    Heterozygous factor V Leiden mutation ([place]) 07/18/2018   History of cardiac catheterization 08/30/2008   a.) LHC 08/30/2008: EF >60%, LVEDP 18 mmHg, normal coronaries.   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   a.) 07/18/2018 --> 22 mol/L   Hypertension    Insomnia    a.) melatonin + trazodone PRN   Long term current use of anticoagulant    a.) apixaban   Lymphedema of both lower extremities    Meralgia paresthetica, left lower limb    OSA (obstructive sleep apnea)    a.) does not require nocturnal PAP therapy   Osteoarthritis    Pneumonia    Pseudophakia of both eyes    Sarcoidosis    Secondary hyperparathyroidism of renal origin ([place])    Type 2 diabetes mellitus treated with insulin ([place])    Vitamin D deficiency     Assessment/Plan: 5 Days Post-Op Procedure(s) (LRB): IRRIGATION AND DEBRIDEMENT WOUND (Right) APPLICATION OF WOUND VAC (Right) Principal Problem:   Cellulitis and abscess of right leg Active Problems:   (HFpEF) heart failure with preserved ejection fraction (HCC)   Lymphedema of both lower extremities   H/O: CVA (cerebrovascular accident)   History of obstructive sleep apnea   Essential hypertension   Morbid obesity with BMI of 50.0-59.9, adult ([place])   Sarcoidosis   Uncontrolled type 2 diabetes mellitus with hyperglycemia, with long-term current use of insulin (HCC)   ESRD on hemodialysis (HCC)   Anemia of chronic renal failure   Non-healing wound of right lower extremity   Chronic anticoagulation   Decubitus ulcer of heel, bilateral   Hypercoagulable state (positive anticardiolipin, elevated homocysteine, factor V Leiden deficiency)   Wound infection   Multiple open wounds of lower leg  Estimated body mass index is 53.47 kg/m as calculated from the following:   Height as of this encounter: 5' 4"$  (1.626 m).   Weight as of this encounter: 141.3 kg. Advance diet Up with therapy  Continue IV antibiotics.  Appreciate wound care and infectious disease involvement.  Wound VAC was changed Friday with ankle padding.  Plan for Wound VAC to be changed Monday by wound care team, if the patient has not discharged to rehab.  DVT Prophylaxis -   Eliquis Weight-Bearing as tolerated to right leg  Reche Dixon, PA-C Orthopaedic Surgery 06/20/2022, 7:06 AM

## 2022-06-20 NOTE — Progress Notes (Signed)
Progress Note   Patient: Jody Nelson [id number] DOB: 06-05-1950 DOA: 06/12/2022     8 DOS: the patient was seen and examined on 06/20/2022   Brief hospital course: Jody Nelson is a 72 y.o. 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  Patient initially was placed on vancomycin and meropenem after seen by ID.  Patient also had a debridement performed 02/06, a wound VAC was placed.  Antibiotic switched to cefepime and vancomycin today by ID, which she can be continued through dialysis.  Assessment and Plan: Cellulitis and abscess of right leg below to multi bacterial infection Nonhealing wound right shin/possible osteomyelitis History of right tibial plateau fracture 2019 with hardware and chronic wound infection Chronic lymphedema Status post I&D with wound VAC.  Appreciate ID consult.  Antibiotic switched to cefepime and vancomycin.   Wound culture final result showed E. coli, MSSA,.  Morganella morganii.  Discussed with ID, patient will finish antibiotics with IV cefepime after each dialysis. Most likely will discharge to nursing home on Monday tomorrow.   Uncontrolled type 2 diabetes mellitus with hyperglycemia, with long-term current use of insulin (HCC) Continue current regimen.   ESRD on hemodialysis [facility]) Hemodialysis per nephrology.   Chronic  anticoagulation Hypercoagulability 2020 (positive anticardiolipin, elevated homocysteine, factor V Leiden deficiency) h/o multiple cryptogenic CVAs Continue Eliquis.   Decubitus ulcer of heel, bilateral Wound care   Anemia of chronic renal failure Iron deficient anemia. Patient received 1 unit PRBC and IV iron.  No B12 deficiency. Patient still has some bleeding from the wound, hemoglobin has been low but stable.  Discussed with nephrology, will give a dose of Epogen.  Recheck CBC tomorrow.   Sarcoidosis Biopsy proven     Morbid obesity with BMI of 50.0-59.9, adult ([place]) Continue to follow, diet exercise advised.   Essential hypertension Continue home medicines.   History of obstructive sleep apnea CPAP nightly   H/O: CVA (cerebrovascular accident) PT will evaluate patient.   (HFpEF) heart failure with preserved ejection fraction (HCC) Condition stable.      Subjective:  Patient doing well today, no nausea vomiting.  Normal bowel movement.  Physical Exam: Vitals:   06/19/22 1558 06/19/22 2149 06/19/22 2344 06/20/22 0749  BP: (!) 143/46 (!) 126/38 (!) 149/40 (!) 124/38  Pulse: 77 79 78 80  Resp: 18  18   Temp: 98 F (36.7 C)  98.2 F (36.8 C) 97.8 F (36.6 C)  TempSrc: Oral   Oral  SpO2: 100%  100% 100%  Weight:      Height:       General exam: Appears calm and comfortable , morbidly obese. Respiratory system: Clear to auscultation. Respiratory effort normal. Cardiovascular system: S1 & S2 heard, RRR. No JVD, murmurs, rubs, gallops or clicks. No pedal edema. Gastrointestinal system: Abdomen is nondistended, soft and nontender. No organomegaly or masses felt. Normal bowel sounds heard. Central nervous system: Alert and oriented. No  focal neurological deficits. Extremities: Right lower extremity edema, wound VAC in place. Skin: No rashes, lesions or ulcers Psychiatry: Judgement and insight appear normal. Mood & affect appropriate.   Data Reviewed:  Lab  results reviewed.  Family Communication: None  Disposition: Status is: Inpatient Remains inpatient appropriate because: Severity of disease, unsafe to discharge.  Planned Discharge Destination: Skilled nursing facility    Time spent: 35 minutes  Author: Sharen Hones, MD 06/20/2022 12:31 PM  For on call review [email or web address].

## 2022-06-20 NOTE — Progress Notes (Signed)
[facility]  PROGRESS NOTE   Subjective:   Patient is hemodynamically stable.  Denies any chest pain or shortness of breath.  Objective:  Vital signs: Blood pressure (!) 124/38, pulse 80, temperature 97.8 F (36.6 C), temperature source Oral, resp. rate 18, height 5' 4"$  (1.626 m), weight (!) 141.3 kg, SpO2 100 %.  Intake/Output Summary (Last 24 hours) at 06/20/2022 1213 Last data filed at 06/20/2022 0530 Gross per 24 hour  Intake 400 ml  Output 100 ml  Net 300 ml   Filed Weights   06/16/22 1205 06/18/22 0855 06/18/22 1252  Weight: (!) 138.1 kg (!) 142.2 kg (!) 141.3 kg     Physical Exam: General:  No acute distress  Head:  Normocephalic, atraumatic. Moist oral mucosal membranes  Eyes:  Anicteric  Neck:  Supple  Lungs:   Clear to auscultation, normal effort  Heart:  S1S2 no rubs  Abdomen:   Soft, nontender, bowel sounds present  Extremities:  peripheral edema.  Neurologic:  Awake, alert, following commands  Skin:  No lesions  Access:     Basic Metabolic Panel: Recent Labs  Lab 06/14/22 0257 06/15/22 0302 06/16/22 0153 06/17/22 0150 06/18/22 0329  NA 135 135 136 133* 135  K 4.9 3.8 4.9 3.5 3.7  CL 99 98 98 97* 99  CO2 23 28 25 28 27  $ GLUCOSE 122* 152* 247* 270* 200*  BUN 45* 27* 38* 34* 42*  CREATININE 6.28* 3.96* 4.79* 4.10* 4.90*  CALCIUM 8.0* 7.6* 8.0* 7.7* 7.8*  PHOS 4.6  --   --   --   --    GFR: Estimated Creatinine Clearance: 15.1 mL/min (A) (by C-G formula based on SCr of 4.9 mg/dL (H)).  Liver Function Tests: No results for input(s): "AST", "ALT", "ALKPHOS", "BILITOT", "PROT", "ALBUMIN" in the last 168 hours. No results for input(s): "LIPASE", "AMYLASE" in the last 168 hours. No results for input(s): "AMMONIA" in the last 168 hours.  CBC: Recent Labs  Lab 06/16/22 0153 06/17/22 0150 06/17/22 1919 06/18/22 0329 06/19/22 0741 06/20/22 0331  WBC 11.1* 9.4  --  8.9 8.7 7.7  HGB 8.4* 6.8* 8.3* 8.0* 7.6* 7.6*  HCT 27.7* 22.4*  27.0* 25.8* 24.7* 24.7*  MCV 93.0 93.3  --  92.5 93.2 91.8  PLT 224 241  --  273 313 290     HbA1C: Hgb A1c MFr Bld  Date/Time Value Ref Range Status  06/13/2022 08:03 AM 8.7 (H) 4.8 - 5.6 % Final    Comment:    (NOTE) Pre diabetes:          5.7%-6.4%  Diabetes:              >6.4%  Glycemic control for   <7.0% adults with diabetes     Urinalysis: No results for input(s): "COLORURINE", "LABSPEC", "PHURINE", "GLUCOSEU", "HGBUR", "BILIRUBINUR", "KETONESUR", "PROTEINUR", "UROBILINOGEN", "NITRITE", "LEUKOCYTESUR" in the last 72 hours.  Invalid input(s): "APPERANCEUR"    Imaging: No results found.   Medications:    anticoagulant sodium citrate     ceFEPime (MAXIPIME) IV Stopped (06/18/22 1420)    apixaban  5 mg Oral Q12H   atorvastatin  80 mg Oral QHS   calcitRIOL  0.25 mcg Oral Daily   carvedilol  25 mg Oral BID   Chlorhexidine Gluconate Cloth  6 each Topical Daily   ferrous sulfate  325 mg Oral Daily   folic acid  1 mg Oral Daily   gabapentin  200 mg Oral BID   insulin aspart  0-9 Units Subcutaneous TID WC   insulin aspart  2 Units Subcutaneous TID WC   insulin glargine-yfgn  15 Units Subcutaneous QHS   leptospermum manuka honey  1 Application Topical Daily   polyethylene glycol  34 g Oral Daily   torsemide  10 mg Oral Daily    Assessment/ Plan:     72 year old female with past medical conditions including anemia, diabetes type 2, 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  Patient presents to the emergency department with increased swelling and pain from right lower extremity.  She has been admitted for Wound infection [T14.8XXA, L08.9] Osteomyelitis of right leg (HCC) [M86.9]   #1: End-stage renal disease: Dialysis scheduled for tomorrow. Will continue to monitor closely.   #2: Anemia: Patient has been on oral iron supplementation along with erythropoietin.   #3:  Secondary hyperparathyroidism: She has been on calcitriol.  Will monitor PTH, calcium and phosphorus levels.   #4: Cellulitis of the right leg: She has been on cefepime.  Patient is being followed by vascular.   #5: Diabetes: Will continue the insulin protocol.   #6: Hypertension: Better controlled.  Will continue torsemide and also carvedilol.    LOS: [place], [facility] 2/11/202412:13 PM

## 2022-06-20 NOTE — Plan of Care (Signed)
  Problem: Clinical Measurements: Goal: Ability to avoid or minimize complications of infection will improve Outcome: Progressing   Problem: Skin Integrity: Goal: Skin integrity will improve Outcome: Progressing   Problem: Education: Goal: Knowledge of General Education information will improve Description: Including pain rating scale, medication(s)/side effects and non-pharmacologic comfort measures Outcome: Progressing   Problem: Clinical Measurements: Goal: Diagnostic test results will improve Outcome: Progressing Goal: Respiratory complications will improve Outcome: Progressing   Problem: Nutrition: Goal: Adequate nutrition will be maintained Outcome: Progressing   Problem: Coping: Goal: Level of anxiety will decrease Outcome: Progressing   Problem: Elimination: Goal: Will not experience complications related to bowel motility Outcome: Progressing   Problem: Safety: Goal: Ability to remain free from injury will improve Outcome: Progressing

## 2022-06-21 DIAGNOSIS — I890 Lymphedema, not elsewhere classified: Secondary | ICD-10-CM | POA: Diagnosis not present

## 2022-06-21 DIAGNOSIS — L03115 Cellulitis of right lower limb: Secondary | ICD-10-CM | POA: Diagnosis not present

## 2022-06-21 DIAGNOSIS — N186 End stage renal disease: Secondary | ICD-10-CM | POA: Diagnosis not present

## 2022-06-21 DIAGNOSIS — S81801A Unspecified open wound, right lower leg, initial encounter: Secondary | ICD-10-CM | POA: Diagnosis not present

## 2022-06-21 DIAGNOSIS — Z6841 Body Mass Index (BMI) 40.0 and over, adult: Secondary | ICD-10-CM

## 2022-06-21 LAB — AEROBIC/ANAEROBIC CULTURE W GRAM STAIN (SURGICAL/DEEP WOUND)

## 2022-06-21 LAB — GLUCOSE, CAPILLARY
Glucose-Capillary: 145 mg/dL — ABNORMAL HIGH (ref 70–99)
Glucose-Capillary: 72 mg/dL (ref 70–99)
Glucose-Capillary: 197 mg/dL — ABNORMAL HIGH (ref 70–99)

## 2022-06-21 LAB — CBC
HCT: 23.6 % — ABNORMAL LOW (ref 36.0–46.0)
Hemoglobin: 7.1 g/dL — ABNORMAL LOW (ref 12.0–15.0)
MCH: 28.3 pg (ref 26.0–34.0)
MCHC: 30.1 g/dL (ref 30.0–36.0)
MCV: 94 fL (ref 80.0–100.0)
Platelets: 310 10*3/uL (ref 150–400)
RBC: 2.51 MIL/uL — ABNORMAL LOW (ref 3.87–5.11)
RDW: 15.1 % (ref 11.5–15.5)
WBC: 7.8 10*3/uL (ref 4.0–10.5)
nRBC: 0 % (ref 0.0–0.2)

## 2022-06-21 LAB — RENAL FUNCTION PANEL
Albumin: 2 g/dL — ABNORMAL LOW (ref 3.5–5.0)
Anion gap: 8 (ref 5–15)
BUN: 37 mg/dL — ABNORMAL HIGH (ref 8–23)
CO2: 27 mmol/L (ref 22–32)
Calcium: 8.1 mg/dL — ABNORMAL LOW (ref 8.9–10.3)
Chloride: 100 mmol/L (ref 98–111)
Creatinine, Ser: 5.02 mg/dL — ABNORMAL HIGH (ref 0.44–1.00)
GFR, Estimated: 9 mL/min — ABNORMAL LOW (ref >60)
Glucose, Bld: 142 mg/dL — ABNORMAL HIGH (ref 70–99)
Phosphorus: 4.5 mg/dL (ref 2.5–4.6)
Potassium: 3.6 mmol/L (ref 3.5–5.1)
Sodium: 135 mmol/L (ref 135–145)

## 2022-06-21 LAB — PREPARE RBC (CROSSMATCH)

## 2022-06-21 MED ORDER — SODIUM CHLORIDE 0.9% IV SOLUTION: Freq: Once | INTRAVENOUS | Status: AC

## 2022-06-21 MED ORDER — MORPHINE SULFATE (PF) 2 MG/ML IV SOLN
INTRAVENOUS | Status: AC
  Filled 2022-06-21: qty 1

## 2022-06-21 MED ORDER — MORPHINE SULFATE (PF) 2 MG/ML IV SOLN
2.0000 mg | Freq: Once | INTRAVENOUS | Status: AC
  Administered 2022-06-21: 2 mg via INTRAVENOUS

## 2022-06-21 MED ORDER — EPOETIN ALFA 10000 UNIT/ML IJ SOLN: 10000.0000 [IU] | INTRAMUSCULAR | Status: DC

## 2022-06-21 MED ORDER — VANCOMYCIN IV (FOR PTA / DISCHARGE USE ONLY): 1000.0000 mg | INTRAVENOUS | Status: AC

## 2022-06-21 MED ORDER — EPOETIN ALFA 10000 UNIT/ML IJ SOLN
INTRAMUSCULAR | Status: AC
  Administered 2022-06-21: 10000 [IU] via INTRAVENOUS
  Filled 2022-06-21: qty 1

## 2022-06-21 MED ORDER — VANCOMYCIN HCL 2000 MG/400ML IV SOLN
2000.0000 mg | Freq: Once | INTRAVENOUS | Status: AC
  Administered 2022-06-21: 2000 mg via INTRAVENOUS
  Filled 2022-06-21: qty 400

## 2022-06-21 NOTE — Plan of Care (Signed)
  Problem: Skin Integrity: Goal: Skin integrity will improve Outcome: Progressing   Problem: Education: Goal: Knowledge of General Education information will improve Description: Including pain rating scale, medication(s)/side effects and non-pharmacologic comfort measures Outcome: Progressing   Problem: Health Behavior/Discharge Planning: Goal: Ability to manage health-related needs will improve Outcome: Progressing

## 2022-06-21 NOTE — Anesthesia Postprocedure Evaluation (Signed)
Anesthesia Post Note  Patient: Jody Nelson  Procedure(s) Performed: IRRIGATION AND DEBRIDEMENT WOUND (Right) APPLICATION OF WOUND VAC (Right)  Patient location during evaluation: PACU Anesthesia Type: General Level of consciousness: awake and alert Pain management: pain level controlled Vital Signs Assessment: post-procedure vital signs reviewed and stable Respiratory status: spontaneous breathing, nonlabored ventilation, respiratory function stable and patient connected to nasal cannula oxygen Cardiovascular status: blood pressure returned to baseline and stable Postop Assessment: no apparent nausea or vomiting Anesthetic complications: no   No notable events documented.   Last Vitals:  Vitals:   06/21/22 1317 06/21/22 1320  BP: (!) 132/53 (!) 135/47  Pulse: 71 73  Resp: 13 12  Temp: 36.6 C   SpO2: 100% 100%    Last Pain:  Vitals:   06/21/22 1320  TempSrc:   PainSc: [place] Noralyn Karim

## 2022-06-21 NOTE — Consult Note (Signed)
[facility] Nurse wound follow up Wound type:Routine NPWT dressing change Measurement: Anterior wound measures 1.5cm round x 0.8cm with red wound bed and scant serous exudate. Lateral wound measures 6c mx 2cm x 3cm with tunneling at 6 o'clock measuring 6cm. Wound bed is red and there is a small to moderate amount of serous exudate.  Wound bed:As noted above Drainage (amount, consistency, odor) As noted above Periwound: intact Dressing procedure/placement/frequency: Patient has been premedicated and consents to dressing change. Her foot (feet) are in the bilateral Prevalon boots requested on Friday and there is no pressure on the right lateral wound. NPWT dressing removed without patient discomfort or distress and wound is cleansed with NS. A thin bridge of skin separates the two wounds and today this is protected with a thin strip of DermaTac drape. Both wounds are filled with black Granufoam and DremaTac drape used to cover. This is sealed by applying gentle pressure to the drape. An aperture is cut into the drape over the larger wound and a T.R.A.C.C. pad applied. The dressing is attached to 190mHg continuous negative pressure and an immediate seal is achieved. The patient tolerated the procedure well.  She is ready for both HD this morning and transfer to the SNF/Rehab once they have acquired the NPWT medical device.  I have discussed with the Bedside Rn taht should the SNF/Rehab not have the 45M brand V.A.C. device and instead use another vendor for NPWT, the current dressing will need to be removed and a NS dampened gauze dressing applied as the 45M VAC tubing is not compatible with any other vendor of NPWT. If the SNF rehab has a a 45M VAC, the dressing may be disconnected, the tubing end covered and protected, and the patient transported as long as reconnection to continuous negative pressure occurs immediately upon arrival to the facility.  Next schedule dressing change is Wednesday, February 14. [facility] will change if patient is still in [place].   [facility] [place], and will remain available to this patient, the nursing and medical teams.    Thank you for inviting [facility] participate in this patient's Plan of Care.  LMaudie Flakes MSN, RN, CNS, [place] CSerita Grammes [employer] Corporationphone:  [phone]

## 2022-06-21 NOTE — Discharge Summary (Addendum)
Physician Discharge Summary   Patient: Jody Nelson MRN: [id number] DOB: 10/19/1950  Admit date:     06/12/2022  Discharge date: 06/21/22  Discharge Physician: Sharen Hones   PCP: Patient, No Pcp Per   Recommendations at discharge:   Follow-up with PCP in 1 week. Check a CBC in 4-5 days. Follow-up with orthopedics as scheduled. May restart Eliquis in 1 week if no additional bleeding from the wound. Refer to palliative care for poor prognosis.  Discharge Diagnoses: Principal Problem:   Cellulitis and abscess of right leg Active Problems:   Non-healing wound of right lower extremity   Lymphedema of both lower extremities   Uncontrolled type 2 diabetes mellitus with hyperglycemia, with long-term current use of insulin (HCC)   ESRD on hemodialysis (HCC)   Chronic anticoagulation   Hypercoagulable state (positive anticardiolipin, elevated homocysteine, factor V Leiden deficiency)   (HFpEF) heart failure with preserved ejection fraction (HCC)   H/O: CVA (cerebrovascular accident)   History of obstructive sleep apnea   Essential hypertension   Morbid obesity with BMI of 50.0-59.9, adult (HCC)   Sarcoidosis   Anemia of chronic renal failure   Decubitus ulcer of heel, bilateral   Wound infection   Multiple open wounds of lower leg  Resolved Problems:   * No resolved hospital problems. [facility] Course: Jody Nelson is a 72 y.o. 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, ESRD on HD MWF, anemia of CKD, DMII , obesity with BMI over 50, OSA (not on CPAP), hypertension, HFpEF, biopsy-proven sarcoidosis, history of right tibial plateau fracture 2019 with hardware and chronic wound infection, chronic bilateral lymphedema with chronic wound left calf and heel, previously followed by wound care clinic, last seen 2021, but getting wound care at her nursing facility who presents to the ED with 2-day history of pain swelling and redness around a chronic  nonhealing wound that has been present for over 2 months that has not responded to prior courses of antibiotic therapy.  Patient initially was placed on vancomycin and meropenem after seen by ID.  Patient also had a debridement performed 02/06, a wound VAC was placed.  Antibiotic switched to cefepime and vancomycin today by ID, which she can be continued through dialysis. Based on culture results, patient will be continued on cefepime per dialysis. Assessment and Plan: Cellulitis and abscess of right leg below to multi bacterial infection Nonhealing wound right shin/possible osteomyelitis History of right tibial plateau fracture 2019 with hardware and chronic wound infection Chronic lymphedema Status post I&D with wound VAC.  Appreciate ID consult.  Antibiotic switched to cefepime and vancomycin.   Wound culture final result showed E. coli, MSSA,.  Morganella morganii.  Discussed with ID, patient will finish antibiotics with IV cefepime after each dialysis. Confirmed with TOC, patient has a wound VAC set up in the nursing home.  He will be followed by wound care as well as orthopedics. Final culture also had MRSA, vancomycin added and will be administrated at HD.   Uncontrolled type 2 diabetes mellitus with hyperglycemia, with long-term current use of insulin (HCC) Continue current regimen.   ESRD on hemodialysis [facility] Hemodialysis per nephrology.   Chronic anticoagulation Hypercoagulability 2020 (positive anticardiolipin, elevated homocysteine, factor V Leiden deficiency) h/o multiple cryptogenic CVAs Due to continued drop of hemoglobin from small amount of bleeding from the wound, the benefit of anticoagulation does not overweigh the risk of bleeding.  I will hold anticoagulation for about a week, restart once bleeding from  the wound is better.   Decubitus ulcer of heel, bilateral Wound care   Anemia of chronic renal failure Iron deficient anemia. Patient received 1 unit PRBC and IV  iron.  No B12 deficiency. Patient still has some bleeding from the wound, reportedly had dropped down to 7.1 today, will transfuse 1 unit of PRBC before discharge.  Recheck hemoglobin in the nursing home.  Sarcoidosis Biopsy proven     Morbid obesity with BMI of 50.0-59.9, adult ([place]) Continue to follow, diet exercise advised.   Essential hypertension Continue home medicines.   History of obstructive sleep apnea CPAP nightly   H/O: CVA (cerebrovascular accident) Continue PT/OT.   (HFpEF) heart failure with preserved ejection fraction ([place]) She has a chronic congestive heart failure, no exacerbation.      Consultants: Nephrology, ID, orthopedics. Procedures performed: I&D  Disposition: Skilled nursing facility Diet recommendation:  Discharge Diet Orders (From admission, onward)     Start     Ordered   06/21/22 0000  Diet - low sodium heart healthy        06/21/22 0925           Renal diet DISCHARGE MEDICATION: Allergies as of 06/21/2022       Reactions   Sulfamethoxazole-trimethoprim    Other reaction(s): Kidney Disorder Other reaction(s): Kidney Disorder Hyperkalemia, AKI Hyperkalemia, AKI        Medication List     STOP taking these medications    apixaban 5 MG Tabs tablet Commonly known as: ELIQUIS   fluconazole 150 MG tablet Commonly known as: DIFLUCAN   furosemide 40 MG tablet Commonly known as: LASIX   Lagevrio 200 MG Caps capsule Generic drug: molnupiravir EUA   nystatin powder Generic drug: nystatin       TAKE these medications    acetaminophen 325 MG tablet Commonly known as: TYLENOL Take 2 tablets by mouth every 8 (eight) hours as needed.   ACIDOPHILUS LACTOBACILLUS PO Take 1 capsule by mouth 3 (three) times daily.   atorvastatin 80 MG tablet Commonly known as: LIPITOR Take 80 mg by mouth daily.   calcitRIOL 0.25 MCG capsule Commonly known as: ROCALTROL Take 0.25 mcg by mouth daily.   calcium acetate 667 MG  capsule Commonly known as: PHOSLO Take one capsule by mouth twice daily with food on Mondays, Wednesdays, and Fridays. Take one capsule 3 times daily on Tuesdays, Thursdays, Saturdays, and Sundays.   carvedilol 25 MG tablet Commonly known as: COREG Take 1 tablet by mouth 2 (two) times daily with a meal.   ceFEPime  IVPB Commonly known as: MAXIPIME Inject 2 g into the vein every Monday, Wednesday, and Friday with hemodialysis. Cefepime 2gm IV after hemodialysis on MWF Indication:  polymicrobial left leg infection Last Day of Therapy:  06/28/2022 To be given at HD center   diclofenac Sodium 1 % Gel Commonly known as: VOLTAREN Apply topically.   docusate sodium 100 MG capsule Commonly known as: COLACE Take 100 mg by mouth 2 (two) times daily.   ferrous sulfate 324 (65 Fe) MG Tbec Take 1 tablet by mouth daily.   folic acid 1 MG tablet Commonly known as: FOLVITE Take 1 mg by mouth daily.   gabapentin 100 MG capsule Commonly known as: NEURONTIN Take 2 capsules by mouth 2 (two) times daily.   HYDROcodone-acetaminophen 5-325 MG tablet Commonly known as: NORCO/VICODIN Take 1 tablet by mouth every 8 (eight) hours as needed for moderate pain. What changed: Another medication with the same name was removed. Continue  taking this medication, and follow the directions you see here.   insulin glargine 100 UNIT/ML Solostar Pen Commonly known as: LANTUS Inject 10 Units into the skin at bedtime.   insulin lispro 100 UNIT/ML injection Commonly known as: HUMALOG Inject 2-12 Units into the skin in the morning, at noon, and at bedtime. Per sliding scale   ipratropium-albuterol 0.5-2.5 (3) MG/3ML Soln Commonly known as: DUONEB Take by nebulization.   latanoprost 0.005 % ophthalmic solution Commonly known as: XALATAN Place 1 drop into both eyes at bedtime.   Lidocaine Pain Relief 4 % Generic drug: lidocaine 1 patch daily. 12 hours on, 12 hours off.   metroNIDAZOLE 1 % gel Commonly  known as: METROGEL Apply topically.   montelukast 10 MG tablet Commonly known as: SINGULAIR Take 10 mg by mouth daily.   omeprazole 10 MG capsule Commonly known as: PRILOSEC Take 10 mg by mouth daily.   Oyster Shell Calcium w/D 500-5 MG-MCG Tabs Take 1 tablet by mouth daily as needed.   polyethylene glycol 17 g packet Commonly known as: MIRALAX / GLYCOLAX Take 17 g by mouth daily.   PreserVision AREDS Tabs Take 250 mg by mouth 2 (two) times daily.   torsemide 10 MG tablet Commonly known as: DEMADEX Take 10 mg by mouth daily.   traZODone 50 MG tablet Commonly known as: DESYREL Take 1 tablet by mouth at bedtime as needed.               Home Infusion Instuctions  (From admission, onward)           Start     Ordered   06/18/22 0000  Home infusion instructions       Question:  Instructions  Answer:  Flushing of vascular access device: 0.9% NaCl pre/post medication administration and prn patency; Heparin 100 u/ml, 65m for implanted ports and Heparin 10u/ml, 567mfor all other central venous catheters.   06/18/22 1143              Discharge Care Instructions  (From admission, onward)           Start     Ordered   06/21/22 0000  Discharge wound care:       Comments: Follow with wound care, continue wound vac   06/21/22 [id number]          Follow-up Information     PaLeim FabryMD Follow up in 2 week(s).   Specialty: Orthopedic Surgery Why: Wound check Contact information: [address] [place] [phone]              Discharge Exam: Filed Weights   06/16/22 1205 06/18/22 0855 06/18/22 1252  Weight: (!) 138.1 kg (!) 142.2 kg (!) 141.3 kg   General exam: Appears calm and comfortable  Respiratory system: Clear to auscultation. Respiratory effort normal. Cardiovascular system: S1 & S2 heard, RRR. No JVD, murmurs, rubs, gallops or clicks.  Gastrointestinal system: Abdomen is nondistended, soft and nontender.  No organomegaly or masses felt. Normal bowel sounds heard. Central nervous system: Alert and oriented. No focal neurological deficits. Extremities: Right leg edema with wound vac Skin: No rashes, lesions or ulcers Psychiatry: Mood & affect appropriate.    Condition at discharge: fair  The results of significant diagnostics from this hospitalization (including imaging, microbiology, ancillary and laboratory) are listed below for reference.   Imaging Studies: CT EXTREMITY LOWER RIGHT WO CONTRAST  Result Date: 06/13/2022 CLINICAL DATA:  Soft tissue infection suspected.  Cellulitis. Evaluate for drainable pocket. EXAM: CT OF THE LOWER RIGHT EXTREMITY WITHOUT CONTRAST TECHNIQUE: Multidetector CT imaging of the right lower extremity was performed according to the standard protocol. RADIATION DOSE REDUCTION: This exam was performed according to the departmental dose-optimization program which includes automated exposure control, adjustment of the mA and/or kV according to patient size and/or use of iterative reconstruction technique. COMPARISON:  None Available. FINDINGS: Bones/Joint/Cartilage No evidence of fracture or dislocation. Multiple lucencies in the distal femur as well as scattered lucencies in the tibia suggesting osteopenia. Plate and screw fixation of the medial aspect of the proximal tibia. Ligaments Suboptimally assessed by CT. Muscles and Tendons Generalized muscle atrophy. No intramuscular fluid collection or abscess. Soft tissues Marked skin thickening and subcutaneous soft tissue edema. There are multiple punctate foci of gas in the posterior aspect of the mid to distal calf without evidence of drainable fluid collection or abscess. IMPRESSION: 1. Marked skin thickening and subcutaneous soft tissue edema consistent with cellulitis. No evidence of drainable fluid collection or abscess. 2. Multiple punctate foci of gas in the posterior aspect of the mid to distal calf without evidence of  drainable fluid collection or abscess. 3. No evidence of fracture or dislocation. 4. Multiple lucencies in the distal femur and tibia suggesting osteopenia. Electronically Signed   By: Keane Police D.O.   On: 06/13/2022 23:02   MR TIBIA FIBULA RIGHT W WO CONTRAST  Result Date: 06/13/2022 CLINICAL DATA:  Lower leg soft tissue infection EXAM: MRI OF LOWER RIGHT EXTREMITY WITHOUT AND WITH CONTRAST TECHNIQUE: Multiplanar, multisequence MR imaging of the right tibia/fib was performed both before and after administration of intravenous contrast. CONTRAST:  78m GADAVIST GADOBUTROL 1 MMOL/ML IV SOLN COMPARISON:  Radiographs 06/12/2022 FINDINGS: Bones/Joint/Cartilage No findings of osteomyelitis in the tibia or fibula. Proximal tibia obscured by artifact related to the plate and screw fixator. Specifically, the mid fibular region of concern on radiography appears normal. Ligaments N/A Muscles and Tendons Prominent regional muscular atrophy. No significant muscular enhancement indicate myositis. Soft tissues Substantial diffuse subcutaneous edema circumferentially in the calf. There is little in the way of associated enhancement and accordingly the appearance is not considered definitive or specific for cellulitis as opposed to other causes of subcutaneous edema. No drainable abscess. IMPRESSION: 1. Substantial diffuse subcutaneous edema in the calf, with little in the way of associated enhancement and accordingly the appearance is not considered definitive or specific for cellulitis as opposed to other causes of subcutaneous edema. No drainable abscess or osteomyelitis. 2. Prominent regional muscular atrophy. Electronically Signed   By: WVan ClinesM.D.   On: 06/13/2022 12:36   DG Tibia/Fibula Right  Result Date: 06/12/2022 CLINICAL DATA:  Wound infection. EXAM: RIGHT TIBIA AND FIBULA - 2 VIEW COMPARISON:  May 12, 2022 FINDINGS: No acute fracture seen. Stable sideplate and screw fixation of the proximal  tibia. Mildly sclerotic appearance of the midportion of the fibula. No cortical erosions seen. Soft tissue edema about the leg. IMPRESSION: 1. No acute fracture or dislocation identified about the right tibia or fibula. 2. Mildly sclerotic appearance of the midportion of the fibula, nonspecific. No cortical erosions seen. Appearance is equivocal for osteomyelitis. 3. Soft tissue edema. Electronically Signed   By: DFidela SalisburyM.D.   On: 06/12/2022 17:59   VAS [place]UPLEX DIALYSIS ACCESS (AVF, AVG)  Result Date: 06/01/2022 DIALYSIS ACCESS Patient Name:  MJOHNNISHA SCHNABEL Date of Exam:   05/27/2022 Medical Rec #: [id number]   Accession #:  [id number] Date of Birth: December 16, 1950    Patient Gender: F Patient Age:   55 years Exam Location:  [facility] Procedure:      VAS US DUPLEX DIALYSIS ACCESS (AVF, AVG) Referring Phys: Hortencia Pilar --------------------------------------------------------------------------------  Reason for Exam: First look. Access Site: Left Upper Extremity. Access Type: BrachAx. History: 04/16/2022 New Left Brach Ax AVG. Performing Technologist: Concha Norway RVT  Examination Guidelines: A complete evaluation includes B-mode imaging, spectral Doppler, color Doppler, and power Doppler as needed of all accessible portions of each vessel. Unilateral testing is considered an integral part of a complete examination. Limited examinations for reoccurring indications may be performed as noted.  Findings:   +--------------------+----------+-----------------+--------+ AVG                 PSV (cm/s)Flow Vol (mL/min)Describe +--------------------+----------+-----------------+--------+ Native artery inflow   265          1850                +--------------------+----------+-----------------+--------+ Arterial anastomosis   229                              +--------------------+----------+-----------------+--------+ Prox graft             208                               +--------------------+----------+-----------------+--------+ Mid graft               94                              +--------------------+----------+-----------------+--------+ Distal graft           132                              +--------------------+----------+-----------------+--------+ Venous anastomosis     140                              +--------------------+----------+-----------------+--------+ Venous outflow          80                              +--------------------+----------+-----------------+--------+ +---------------+------------+----------+---------+--------+------------------+                  Diameter  Depth (cm)Branching  PSV      Flow Volume                        (cm)                        (cm/s)      (ml/min)      +---------------+------------+----------+---------+--------+------------------+ Left Rad Art                                     27                      Dis                                                                      +---------------+------------+----------+---------+--------+------------------+  Antegrade                                                                +---------------+------------+----------+---------+--------+------------------+  Summary: Patent new left BrachAx AVG with no evidence of stenosis or other abnormalities. This is first look s/p placement.  *See table(s) above for measurements and observations.  Diagnosing physician: Hortencia Pilar MD Electronically signed by Hortencia Pilar MD on 06/01/2022 at 7:22:03 AM.   --------------------------------------------------------------------------------   Final     Microbiology: Results for orders placed or performed during the hospital encounter of 06/12/22  MRSA Next Gen by PCR, Nasal     Status: None   Collection Time: 06/13/22 12:45 AM   Specimen: Nasal Mucosa; Nasal Swab  Result Value Ref Range Status   MRSA by PCR Next Gen NOT DETECTED  NOT DETECTED Final    Comment: (NOTE) The GeneXpert MRSA Assay (FDA approved for NASAL specimens only), is one component of a comprehensive MRSA colonization surveillance program. It is not intended to diagnose MRSA infection nor to guide or monitor treatment for MRSA infections. Test performance is not FDA approved in patients less than 22 years old. Performed at [facility], [address] [place], [place] [address]   Culture, blood (Routine X 2) w Reflex to ID Panel     Status: None   Collection Time: 06/13/22  9:34 AM   Specimen: BLOOD  Result Value Ref Range Status   Specimen Description BLOOD BLOOD LEFT HAND  Final   Special Requests   Final    BOTTLES DRAWN AEROBIC AND ANAEROBIC Blood Culture adequate volume   Culture   Final    NO GROWTH 5 DAYS Performed at [facility], [address] [place], [place] [address]    Report Status 06/18/2022 FINAL  Final  Aerobic Culture w Gram Stain (superficial specimen)     Status: None   Collection Time: 06/13/22  9:35 AM   Specimen: Abscess  Result Value Ref Range Status   Specimen Description   Final    ABSCESS S T Performed at [facility], [address], [place], [place] [address]    Special Requests   Final    NONE Performed at [facility], [address] [place], [place] [address]    Gram Stain   Final    FEW GRAM POSITIVE COCCI IN SINGLES IN PAIRS RARE GRAM VARIABLE ROD NO WBC SEEN Performed at [facility], [place] [address], [place], [place] [address]    Culture   Final    FEW MORGANELLA MORGANII FEW ESCHERICHIA COLI FEW STAPHYLOCOCCUS AUREUS    Report Status 06/17/2022 FINAL  Final   Organism ID, Bacteria MORGANELLA MORGANII  Final   Organism ID, Bacteria ESCHERICHIA COLI  Final   Organism ID, Bacteria STAPHYLOCOCCUS AUREUS  Final      Susceptibility   Escherichia coli - MIC*    AMPICILLIN >=32 RESISTANT Resistant     CEFAZOLIN <=4 SENSITIVE Sensitive      CEFEPIME <=0.12 SENSITIVE Sensitive     CEFTAZIDIME <=1 SENSITIVE Sensitive     CEFTRIAXONE <=0.25 SENSITIVE Sensitive     CIPROFLOXACIN >=4 RESISTANT Resistant     GENTAMICIN <=1 SENSITIVE Sensitive     IMIPENEM <=0.25 SENSITIVE Sensitive  TRIMETH/SULFA >=320 RESISTANT Resistant     AMPICILLIN/SULBACTAM 16 INTERMEDIATE Intermediate     PIP/TAZO <=4 SENSITIVE Sensitive     * FEW ESCHERICHIA COLI   Morganella morganii - MIC*    AMPICILLIN >=32 RESISTANT Resistant     CEFAZOLIN RESISTANT Resistant     CEFTAZIDIME <=1 SENSITIVE Sensitive     CIPROFLOXACIN >=4 RESISTANT Resistant     GENTAMICIN <=1 SENSITIVE Sensitive     IMIPENEM 0.5 SENSITIVE Sensitive     TRIMETH/SULFA >=320 RESISTANT Resistant     AMPICILLIN/SULBACTAM 16 INTERMEDIATE Intermediate     PIP/TAZO <=4 SENSITIVE Sensitive     * FEW MORGANELLA MORGANII   Staphylococcus aureus - MIC*    CIPROFLOXACIN >=8 RESISTANT Resistant     ERYTHROMYCIN >=8 RESISTANT Resistant     GENTAMICIN <=0.5 SENSITIVE Sensitive     OXACILLIN 2 SENSITIVE Sensitive     TETRACYCLINE >=16 RESISTANT Resistant     VANCOMYCIN 1 SENSITIVE Sensitive     TRIMETH/SULFA >=320 RESISTANT Resistant     CLINDAMYCIN <=0.25 SENSITIVE Sensitive     RIFAMPIN <=0.5 SENSITIVE Sensitive     Inducible Clindamycin NEGATIVE Sensitive     * FEW STAPHYLOCOCCUS AUREUS  Culture, blood (Routine X 2) w Reflex to ID Panel     Status: None   Collection Time: 06/13/22  2:23 PM   Specimen: BLOOD  Result Value Ref Range Status   Specimen Description BLOOD BLOOD RIGHT FOREARM  Final   Special Requests   Final    BOTTLES DRAWN AEROBIC AND ANAEROBIC Blood Culture adequate volume   Culture   Final    NO GROWTH 5 DAYS Performed at [facility], [address], [place], [place] [address]    Report Status 06/18/2022 FINAL  Final  Aerobic/Anaerobic Culture w Gram Stain (surgical/deep wound)     Status: None   Collection Time: 06/15/22  2:37 PM   Specimen: Leg,  Right; Wound  Result Value Ref Range Status   Specimen Description   Final    WOUND Performed at [facility], [address] [place], [place] [address]    Special Requests   Final    RIGHT LEG Performed at [facility], [address] [place], [place] [address]    Gram Stain   Final    FEW WBC PRESENT,BOTH PMN AND MONONUCLEAR RARE GRAM POSITIVE COCCI IN PAIRS    Culture   Final    RARE METHICILLIN RESISTANT STAPHYLOCOCCUS AUREUS RARE ESCHERICHIA COLI RARE GROUP B STREP(S.AGALACTIAE)ISOLATED TESTING AGAINST S. AGALACTIAE NOT ROUTINELY PERFORMED DUE TO PREDICTABILITY OF AMP/PEN/VAN SUSCEPTIBILITY. FEW MORGANELLA MORGANII NO ANAEROBES ISOLATED Performed at [facility], [place] [address] [place], [place] [address]    Report Status 06/21/2022 FINAL  Final   Organism ID, Bacteria ESCHERICHIA COLI  Final   Organism ID, Bacteria MORGANELLA MORGANII  Final   Organism ID, Bacteria METHICILLIN RESISTANT STAPHYLOCOCCUS AUREUS  Final      Susceptibility   Escherichia coli - MIC*    AMPICILLIN >=32 RESISTANT Resistant     CEFAZOLIN <=4 SENSITIVE Sensitive     CEFEPIME <=0.12 SENSITIVE Sensitive     CEFTAZIDIME <=1 SENSITIVE Sensitive     CEFTRIAXONE <=0.25 SENSITIVE Sensitive     CIPROFLOXACIN >=4 RESISTANT Resistant     GENTAMICIN <=1 SENSITIVE Sensitive     IMIPENEM <=0.25 SENSITIVE Sensitive     TRIMETH/SULFA >=320 RESISTANT Resistant     AMPICILLIN/SULBACTAM 16 INTERMEDIATE Intermediate     PIP/TAZO <=4 SENSITIVE Sensitive     *  RARE ESCHERICHIA COLI   Morganella morganii - MIC*    AMPICILLIN >=32 RESISTANT Resistant     CEFAZOLIN >=64 RESISTANT Resistant     CEFTAZIDIME <=1 SENSITIVE Sensitive     CIPROFLOXACIN >=4 RESISTANT Resistant     GENTAMICIN <=1 SENSITIVE Sensitive     IMIPENEM 2 SENSITIVE Sensitive     TRIMETH/SULFA >=320 RESISTANT Resistant     AMPICILLIN/SULBACTAM >=32 RESISTANT Resistant     PIP/TAZO <=4 SENSITIVE Sensitive     * FEW  MORGANELLA MORGANII   Methicillin resistant staphylococcus aureus - MIC*    CIPROFLOXACIN >=8 RESISTANT Resistant     ERYTHROMYCIN >=8 RESISTANT Resistant     GENTAMICIN <=0.5 SENSITIVE Sensitive     OXACILLIN >=4 RESISTANT Resistant     TETRACYCLINE >=16 RESISTANT Resistant     VANCOMYCIN 1 SENSITIVE Sensitive     TRIMETH/SULFA >=320 RESISTANT Resistant     CLINDAMYCIN <=0.25 SENSITIVE Sensitive     RIFAMPIN <=0.5 SENSITIVE Sensitive     Inducible Clindamycin NEGATIVE Sensitive     * RARE METHICILLIN RESISTANT STAPHYLOCOCCUS AUREUS    Labs: CBC: Recent Labs  Lab 06/17/22 0150 06/17/22 1919 06/18/22 0329 06/19/22 0741 06/20/22 0331 06/21/22 0435  WBC 9.4  --  8.9 8.7 7.7 7.8  HGB 6.8* 8.3* 8.0* 7.6* 7.6* 7.1*  HCT 22.4* 27.0* 25.8* 24.7* 24.7* 23.6*  MCV 93.3  --  92.5 93.2 91.8 94.0  PLT 241  --  273 313 290 [id number]   Basic Metabolic Panel: Recent Labs  Lab 06/15/22 0302 06/16/22 0153 06/17/22 0150 06/18/22 0329  NA 135 136 133* 135  K 3.8 4.9 3.5 3.7  CL 98 98 97* 99  CO2 28 25 28 27  $ GLUCOSE 152* 247* 270* 200*  BUN 27* 38* 34* 42*  CREATININE 3.96* 4.79* 4.10* 4.90*  CALCIUM 7.6* 8.0* 7.7* 7.8*   Liver Function Tests: No results for input(s): "AST", "ALT", "ALKPHOS", "BILITOT", "PROT", "ALBUMIN" in the last 168 hours. CBG: Recent Labs  Lab 06/20/22 1119 06/20/22 1321 06/20/22 1632 06/20/22 2046 06/21/22 0753  GLUCAP 135* 187* 191* 175* 145*    Discharge time spent: greater than 30 minutes.  Signed: Sharen Hones, MD [facility] 06/21/2022

## 2022-06-21 NOTE — Progress Notes (Signed)
1147: Blood transfusion started. Monitored for blood transfusion reaction.   1230: Transfusion of 1 unit RBCs done. No signs and symptoms of transfusion reaction. Floor RN notified.

## 2022-06-21 NOTE — Progress Notes (Signed)
Per Einar Pheasant, WOC  Wound vac dressing removed. Normal saline with wet to dry dressing applied. Covered with ABD and wrapped with Kerlix. Sherron Flemings, at [facility] notified.

## 2022-06-21 NOTE — Progress Notes (Signed)
Received patient in bed to unit.  Alert and oriented.  Informed consent signed and in chart.   TX duration: 3.5 hours  Patient tolerated well.  Transported back to the room  Alert, without acute distress.  Hand-off given to patient's nurse.   Access used:  Left AVF Access issues: high venous pressure. Decreased BFR to 350 and MD is aware.  Total UF removed: 2L Medication(s) given: Cefipime, Epogen 10 000 units, Morphine 21m.     [facility]

## 2022-06-21 NOTE — Progress Notes (Signed)
EMS here to transport pt to [facility].

## 2022-06-21 NOTE — Plan of Care (Signed)
  Problem: Clinical Measurements: Goal: Ability to avoid or minimize complications of infection will improve Outcome: Progressing   Problem: Skin Integrity: Goal: Skin integrity will improve Outcome: Progressing   Problem: Education: Goal: Knowledge of General Education information will improve Description: Including pain rating scale, medication(s)/side effects and non-pharmacologic comfort measures Outcome: Progressing   Problem: Health Behavior/Discharge Planning: Goal: Ability to manage health-related needs will improve Outcome: Progressing   Problem: Clinical Measurements: Goal: Ability to maintain clinical measurements within normal limits will improve Outcome: Progressing Goal: Will remain free from infection Outcome: Progressing Goal: Diagnostic test results will improve Outcome: Progressing Goal: Respiratory complications will improve Outcome: Progressing Goal: Cardiovascular complication will be avoided Outcome: Progressing   Problem: Activity: Goal: Risk for activity intolerance will decrease Outcome: Progressing   Problem: Nutrition: Goal: Adequate nutrition will be maintained Outcome: Progressing   Problem: Coping: Goal: Level of anxiety will decrease Outcome: Progressing   Problem: Elimination: Goal: Will not experience complications related to bowel motility Outcome: Progressing Goal: Will not experience complications related to urinary retention Outcome: Progressing   Problem: Pain Managment: Goal: General experience of comfort will improve Outcome: Progressing   Problem: Safety: Goal: Ability to remain free from injury will improve Outcome: Progressing   Problem: Skin Integrity: Goal: Risk for impaired skin integrity will decrease Outcome: Progressing   Problem: Education: Goal: Ability to describe self-care measures that may prevent or decrease complications (Diabetes Survival Skills Education) will improve Outcome: Progressing Goal:  Individualized Educational Video(s) Outcome: Progressing   Problem: Coping: Goal: Ability to adjust to condition or change in health will improve Outcome: Progressing   Problem: Fluid Volume: Goal: Ability to maintain a balanced intake and output will improve Outcome: Progressing   Problem: Health Behavior/Discharge Planning: Goal: Ability to identify and utilize available resources and services will improve Outcome: Progressing Goal: Ability to manage health-related needs will improve Outcome: Progressing   Problem: Metabolic: Goal: Ability to maintain appropriate glucose levels will improve Outcome: Progressing   Problem: Nutritional: Goal: Maintenance of adequate nutrition will improve Outcome: Progressing Goal: Progress toward achieving an optimal weight will improve Outcome: Progressing   Problem: Skin Integrity: Goal: Risk for impaired skin integrity will decrease Outcome: Progressing   Problem: Tissue Perfusion: Goal: Adequacy of tissue perfusion will improve Outcome: Progressing   

## 2022-06-21 NOTE — Progress Notes (Signed)
Attempted to reach daughter to notify that pt just left via EMS, no answer.

## 2022-06-21 NOTE — Progress Notes (Signed)
PT Cancellation Note  Patient Details Name: Jody Nelson MRN: [id number] DOB: 11-Oct-1950   Cancelled Treatment:    Reason Eval/Treat Not Completed: Patient at procedure or test/unavailable Attempted to see pt for PT tx. Pt noted to be OTF at HD at this time. Will f/u as able.  Lavone Nian, PT, DPT 06/21/22, 9:51 AM   Jody Nelson 06/21/2022, 9:51 AM

## 2022-06-21 NOTE — Progress Notes (Signed)
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  Patient presents to the emergency department with increased swelling and pain from right lower extremity.  She has been admitted for Wound infection [T14.8XXA, L08.9] Osteomyelitis of right leg [facility]) [M86.9]  Patient is known to our practice and receives outpatient dialysis treatments at [facility] on a Monday Wednesday Friday schedule, supervised by Dr. Candiss Norse.    Patient seen and evaluated during dialysis   HEMODIALYSIS FLOWSHEET:  Blood Flow Rate (mL/min): 350 mL/min Arterial Pressure (mmHg): -150 mmHg Venous Pressure (mmHg): 250 mmHg TMP (mmHg): 11 mmHg Ultrafiltration Rate (mL/min): 829 mL/min Dialysate Flow Rate (mL/min): 300 ml/min Dialysis Fluid Bolus: Normal Saline Bolus Amount (mL): 300 mL  No complaints at this time    Objective:  Vital signs in last 24 hours:  Temp:  [97.9 F (36.6 C)-98.4 F (36.9 C)] 97.9 F (36.6 C) (02/12 0900) Pulse Rate:  [69-79] 69 (02/12 1030) Resp:  [11-18] 12 (02/12 1030) BP: (95-135)/(33-54) 121/53 (02/12 1030) SpO2:  [99 %-100 %] 100 % (02/12 1030)  Weight change:  Filed Weights   06/16/22 1205 06/18/22 0855 06/18/22 1252  Weight: (!) 138.1 kg (!) 142.2 kg (!) 141.3 kg    Intake/Output: I/O last 3 completed shifts: In: -  Out: 375 [Urine:350; Drains:25]   Intake/Output this shift:  Total I/O In: -  Out: 150 [Urine:150]  Physical Exam: General: NAD  Head: Normocephalic, atraumatic. Moist oral mucosal membranes  Eyes: Anicteric  Lungs:  Clear to auscultation, normal effort, room air  Heart: Regular rate and rhythm  Abdomen:  Soft, nontender, obese  Extremities: No peripheral edema.  Neurologic: Alert and oriented, moving  all four extremities  Skin: No lesions, Lt upper arm bruising  Access: Left AVG, Rt permcath    Basic Metabolic Panel: Recent Labs  Lab 06/15/22 0302 06/16/22 0153 06/17/22 0150 06/18/22 0329 06/21/22 0919  NA 135 136 133* 135 135  K 3.8 4.9 3.5 3.7 3.6  CL 98 98 97* 99 100  CO2 28 25 28 27 27  $ GLUCOSE 152* 247* 270* 200* 142*  BUN 27* 38* 34* 42* 37*  CREATININE 3.96* 4.79* 4.10* 4.90* 5.02*  CALCIUM 7.6* 8.0* 7.7* 7.8* 8.1*  PHOS  --   --   --   --  4.5     Liver Function Tests: Recent Labs  Lab 06/21/22 0919  ALBUMIN 2.0*   No results for input(s): "LIPASE", "AMYLASE" in the last 168 hours. No results for input(s): "AMMONIA" in the last 168 hours.  CBC: Recent Labs  Lab 06/17/22 0150 06/17/22 1919 06/18/22 0329 06/19/22 0741 06/20/22 0331 06/21/22 0435  WBC 9.4  --  8.9 8.7 7.7 7.8  HGB 6.8* 8.3* 8.0* 7.6* 7.6* 7.1*  HCT 22.4* 27.0* 25.8* 24.7* 24.7* 23.6*  MCV 93.3  --  92.5 93.2 91.8 94.0  PLT 241  --  273 313 290 310     Cardiac Enzymes: No results for input(s): "CKTOTAL", "CKMB", "CKMBINDEX", "TROPONINI" in the last 168 hours.  BNP: Invalid input(s): "POCBNP"  CBG: Recent Labs  Lab 06/20/22 1119 06/20/22 1321 06/20/22 1632 06/20/22 2046 06/21/22 0753  GLUCAP 135* 187* 191* 175* 145*     Microbiology: Results for orders placed or performed during the hospital encounter of 06/12/22  MRSA Next Gen by  PCR, Nasal     Status: None   Collection Time: 06/13/22 12:45 AM   Specimen: Nasal Mucosa; Nasal Swab  Result Value Ref Range Status   MRSA by PCR Next Gen NOT DETECTED NOT DETECTED Final    Comment: (NOTE) The GeneXpert MRSA Assay (FDA approved for NASAL specimens only), is one component of a comprehensive MRSA colonization surveillance program. It is not intended to diagnose MRSA infection nor to guide or monitor treatment for MRSA infections. Test performance is not FDA approved in patients less than 38 years old. Performed at  [facility], [address] [place], [place] [address]   Culture, blood (Routine X 2) w Reflex to ID Panel     Status: None   Collection Time: 06/13/22  9:34 AM   Specimen: BLOOD  Result Value Ref Range Status   Specimen Description BLOOD BLOOD LEFT HAND  Final   Special Requests   Final    BOTTLES DRAWN AEROBIC AND ANAEROBIC Blood Culture adequate volume   Culture   Final    NO GROWTH 5 DAYS Performed at [facility], [address], [place], [place] [address]    Report Status 06/18/2022 FINAL  Final  Aerobic Culture w Gram Stain (superficial specimen)     Status: None   Collection Time: 06/13/22  9:35 AM   Specimen: Abscess  Result Value Ref Range Status   Specimen Description   Final    ABSCESS S T Performed at [facility], [address], [place], [place] [address]    Special Requests   Final    NONE Performed at [facility], [address] [place], [place] [address]    Gram Stain   Final    FEW GRAM POSITIVE COCCI IN SINGLES IN PAIRS RARE GRAM VARIABLE ROD NO WBC SEEN Performed at [facility], [place] [address], [place], [place] [address]    Culture   Final    FEW MORGANELLA MORGANII FEW ESCHERICHIA COLI FEW STAPHYLOCOCCUS AUREUS    Report Status 06/17/2022 FINAL  Final   Organism ID, Bacteria MORGANELLA MORGANII  Final   Organism ID, Bacteria ESCHERICHIA COLI  Final   Organism ID, Bacteria STAPHYLOCOCCUS AUREUS  Final      Susceptibility   Escherichia coli - MIC*    AMPICILLIN >=32 RESISTANT Resistant     CEFAZOLIN <=4 SENSITIVE Sensitive     CEFEPIME <=0.12 SENSITIVE Sensitive     CEFTAZIDIME <=1 SENSITIVE Sensitive     CEFTRIAXONE <=0.25 SENSITIVE Sensitive     CIPROFLOXACIN >=4 RESISTANT Resistant     GENTAMICIN <=1 SENSITIVE Sensitive     IMIPENEM <=0.25 SENSITIVE Sensitive     TRIMETH/SULFA >=320 RESISTANT Resistant     AMPICILLIN/SULBACTAM 16 INTERMEDIATE Intermediate     PIP/TAZO <=4 SENSITIVE  Sensitive     * FEW ESCHERICHIA COLI   Morganella morganii - MIC*    AMPICILLIN >=32 RESISTANT Resistant     CEFAZOLIN RESISTANT Resistant     CEFTAZIDIME <=1 SENSITIVE Sensitive     CIPROFLOXACIN >=4 RESISTANT Resistant     GENTAMICIN <=1 SENSITIVE Sensitive     IMIPENEM 0.5 SENSITIVE Sensitive     TRIMETH/SULFA >=320 RESISTANT Resistant     AMPICILLIN/SULBACTAM 16 INTERMEDIATE Intermediate     PIP/TAZO <=4 SENSITIVE Sensitive     * FEW MORGANELLA MORGANII   Staphylococcus aureus - MIC*    CIPROFLOXACIN >=8 RESISTANT Resistant     ERYTHROMYCIN >=8 RESISTANT Resistant     GENTAMICIN <=  0.5 SENSITIVE Sensitive     OXACILLIN 2 SENSITIVE Sensitive     TETRACYCLINE >=16 RESISTANT Resistant     VANCOMYCIN 1 SENSITIVE Sensitive     TRIMETH/SULFA >=320 RESISTANT Resistant     CLINDAMYCIN <=0.25 SENSITIVE Sensitive     RIFAMPIN <=0.5 SENSITIVE Sensitive     Inducible Clindamycin NEGATIVE Sensitive     * FEW STAPHYLOCOCCUS AUREUS  Culture, blood (Routine X 2) w Reflex to ID Panel     Status: None   Collection Time: 06/13/22  2:23 PM   Specimen: BLOOD  Result Value Ref Range Status   Specimen Description BLOOD BLOOD RIGHT FOREARM  Final   Special Requests   Final    BOTTLES DRAWN AEROBIC AND ANAEROBIC Blood Culture adequate volume   Culture   Final    NO GROWTH 5 DAYS Performed at [facility], [address] [place], [place] [address]    Report Status 06/18/2022 FINAL  Final  Aerobic/Anaerobic Culture w Gram Stain (surgical/deep wound)     Status: None   Collection Time: 06/15/22  2:37 PM   Specimen: Leg, Right; Wound  Result Value Ref Range Status   Specimen Description   Final    WOUND Performed at [facility], [address] [place], [place] [address]    Special Requests   Final    RIGHT LEG Performed at [facility], [address] [place], [place] [address]    Gram Stain   Final    FEW WBC PRESENT,BOTH PMN AND MONONUCLEAR RARE GRAM  POSITIVE COCCI IN PAIRS    Culture   Final    RARE METHICILLIN RESISTANT STAPHYLOCOCCUS AUREUS RARE ESCHERICHIA COLI RARE GROUP B STREP(S.AGALACTIAE)ISOLATED TESTING AGAINST S. AGALACTIAE NOT ROUTINELY PERFORMED DUE TO PREDICTABILITY OF AMP/PEN/VAN SUSCEPTIBILITY. FEW MORGANELLA MORGANII NO ANAEROBES ISOLATED Performed at [facility], [place] [address], [place], [place] [address]    Report Status 06/21/2022 FINAL  Final   Organism ID, Bacteria ESCHERICHIA COLI  Final   Organism ID, Bacteria MORGANELLA MORGANII  Final   Organism ID, Bacteria METHICILLIN RESISTANT STAPHYLOCOCCUS AUREUS  Final      Susceptibility   Escherichia coli - MIC*    AMPICILLIN >=32 RESISTANT Resistant     CEFAZOLIN <=4 SENSITIVE Sensitive     CEFEPIME <=0.12 SENSITIVE Sensitive     CEFTAZIDIME <=1 SENSITIVE Sensitive     CEFTRIAXONE <=0.25 SENSITIVE Sensitive     CIPROFLOXACIN >=4 RESISTANT Resistant     GENTAMICIN <=1 SENSITIVE Sensitive     IMIPENEM <=0.25 SENSITIVE Sensitive     TRIMETH/SULFA >=320 RESISTANT Resistant     AMPICILLIN/SULBACTAM 16 INTERMEDIATE Intermediate     PIP/TAZO <=4 SENSITIVE Sensitive     * RARE ESCHERICHIA COLI   Morganella morganii - MIC*    AMPICILLIN >=32 RESISTANT Resistant     CEFAZOLIN >=64 RESISTANT Resistant     CEFTAZIDIME <=1 SENSITIVE Sensitive     CIPROFLOXACIN >=4 RESISTANT Resistant     GENTAMICIN <=1 SENSITIVE Sensitive     IMIPENEM 2 SENSITIVE Sensitive     TRIMETH/SULFA >=320 RESISTANT Resistant     AMPICILLIN/SULBACTAM >=32 RESISTANT Resistant     PIP/TAZO <=4 SENSITIVE Sensitive     * FEW MORGANELLA MORGANII   Methicillin resistant staphylococcus aureus - MIC*    CIPROFLOXACIN >=8 RESISTANT Resistant     ERYTHROMYCIN >=8 RESISTANT Resistant     GENTAMICIN <=0.5 SENSITIVE Sensitive     OXACILLIN >=4 RESISTANT Resistant     TETRACYCLINE >=16  RESISTANT Resistant     VANCOMYCIN 1 SENSITIVE Sensitive     TRIMETH/SULFA >=320 RESISTANT Resistant      CLINDAMYCIN <=0.25 SENSITIVE Sensitive     RIFAMPIN <=0.5 SENSITIVE Sensitive     Inducible Clindamycin NEGATIVE Sensitive     * RARE METHICILLIN RESISTANT STAPHYLOCOCCUS AUREUS    Coagulation Studies: No results for input(s): "LABPROT", "INR" in the last 72 hours.  Urinalysis: No results for input(s): "COLORURINE", "LABSPEC", "PHURINE", "GLUCOSEU", "HGBUR", "BILIRUBINUR", "KETONESUR", "PROTEINUR", "UROBILINOGEN", "NITRITE", "LEUKOCYTESUR" in the last 72 hours.  Invalid input(s): "APPERANCEUR"    Imaging: No results found.   Medications:    anticoagulant sodium citrate     ceFEPime (MAXIPIME) IV Stopped (06/18/22 1420)    atorvastatin  80 mg Oral QHS   calcitRIOL  0.25 mcg Oral Daily   carvedilol  25 mg Oral BID   Chlorhexidine Gluconate Cloth  6 each Topical Daily   epoetin (EPOGEN/PROCRIT) injection  10,000 Units Intravenous Q M,W,F-HD   ferrous sulfate  325 mg Oral Daily   folic acid  1 mg Oral Daily   gabapentin  200 mg Oral BID   insulin aspart  0-9 Units Subcutaneous TID WC   insulin aspart  2 Units Subcutaneous TID WC   insulin glargine-yfgn  15 Units Subcutaneous QHS   leptospermum manuka honey  1 Application Topical Daily   polyethylene glycol  34 g Oral Daily   torsemide  10 mg Oral Daily   acetaminophen **OR** acetaminophen, albuterol, alteplase, anticoagulant sodium citrate, Glycerin (Adult), heparin, HYDROcodone-acetaminophen, lidocaine (PF), lidocaine-prilocaine, ondansetron **OR** ondansetron (ZOFRAN) IV, oxyCODONE-acetaminophen, pentafluoroprop-tetrafluoroeth, traZODone  Assessment/ Plan:  Jody Nelson is a 72 y.o.  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  Patient presents to the emergency department with increased swelling and pain from right lower extremity.  She has been admitted for Wound  infection [T14.8XXA, L08.9] Osteomyelitis of right leg (HCC) [M86.9]   End-stage renal disease on hemodialysis. Receiving dialysis with UF 2L as tolerated. Next treatment scheduled for Wednesday.   2. Anemia of chronic kidney disease Lab Results  Component Value Date   HGB 7.1 (L) 06/21/2022    Patient receives Mircera at outpatient clinic. Hgb remains decreased, will order EPO 10,000 units with dialysis.   3. Secondary Hyperparathyroidism: with outpatient labs: PTH 463, phosphorus 6.8, calcium 8.4 on 05/17/22.    Lab Results  Component Value Date   CALCIUM 8.1 (L) 06/21/2022   CAION 1.13 (L) 04/16/2022   PHOS 4.5 06/21/2022    Currently prescribed calcitriol, cholecalciferol, and calcium acetate outpatient. Calcium and phosphorus within desired range. Continue calcitriol daily.  4. Diabetes mellitus type II with chronic kidney disease/renal manifestations: insulin dependent. Home regimen includes Lantus.   Glucose well controlled, sliding scale insulin managed by primary team.  5. Cellulitis and abscess of right leg, Imaging negative for osteomyelitis. I&D competed on 06/15/22 with NPWV placed. Remains on daily cefepime and Vanc given with dialysis.  ID recommending cefepime 2g/2g/2g with dialysis until 06/29/22. WOC performing dressing changes, NPWV will remain at discharge with outpatient follow up.      LOS: [place] 2/12/202410:43 AM

## 2022-06-21 NOTE — Care Management Important Message (Signed)
Important Message  Patient Details  Name: Jody Nelson MRN: [id number] Date of Birth: 04-12-1951   Medicare Important Message Given:  Yes  Reviewed Medicare IM with patient via room phone [phone]).  Declined copy of Medicare IM at this time.    Dannette Barbara 06/21/2022, 3:09 PM

## 2022-06-21 NOTE — TOC Transition Note (Addendum)
Transition of Care [facility]) - CM/SW Discharge Note   Patient Details  Name: Jody Nelson MRN: [id number] Date of Birth: [birth year]  Transition of Care [facility]) CM/SW Contact:  Beverly Sessions, RN Phone Number: 06/21/2022, 2:33 PM   Clinical Narrative:     Caryl Pina at [facility] confirms they have the wound vac at the facility  Patient will DC to: [facility] Anticipated DC date: 06/21/22  Family notified:Andrewnette Transport by: Johnanna Schneiders  Per MD patient ready for DC to . RN, patient, patient's family, and facility notified of DC. Discharge Summary sent to facility. RN given number for report. DC packet on chart. Ambulance transport requested for patient.  Elvera Bicker dialysis liaison notified of discharge  [place] signing off.  Isaias Cowman [facility] [phone]          Patient Goals and CMS Choice      Discharge Placement                         Discharge Plan and Services Additional resources added to the After Visit Summary for                                       Social Determinants of Health (SDOH) Interventions SDOH Screenings   Food Insecurity: No Food Insecurity (06/12/2022)  Housing: Low Risk  (06/12/2022)  Transportation Needs: No Transportation Needs (06/12/2022)  Utilities: Not At Risk (06/12/2022)  Tobacco Use: Medium Risk (06/16/2022)     Readmission Risk Interventions     No data to display

## 2022-06-21 NOTE — Progress Notes (Signed)
PHARMACY CONSULT NOTE FOR:  OUTPATIENT  PARENTERAL ANTIBIOTIC THERAPY (OPAT)  Indication: Polymicrobial infection of right leg Regimen:  Cefepime 2gm IV with HD on MWF Vancomycin 1gm IV with HD on MWF End date: 06/29/2022  (last dose will be with 2/19 HD)  IV antibiotic discharge orders are pended. To discharging provider:  please sign these orders via discharge navigator,  Select New Orders & click on the button choice - Manage This Unsigned Work.     Thank you for allowing pharmacy to be a part of this patient's care.  Doreene Eland, PharmD, BCPS, BCIDP Work Cell: [phone] 06/21/2022 1:37 PM

## 2022-06-22 LAB — TYPE AND SCREEN
ABO/RH(D): A POS
Antibody Screen: NEGATIVE
Unit division: 0

## 2022-06-22 LAB — BPAM RBC
Blood Product Expiration Date: 202403092359
ISSUE DATE / TIME: 202402121129
Unit Type and Rh: 6200

## 2022-07-13 ENCOUNTER — Other Ambulatory Visit (INDEPENDENT_AMBULATORY_CARE_PROVIDER_SITE_OTHER): Payer: Self-pay | Admitting: Internal Medicine

## 2022-07-13 DIAGNOSIS — M79602 Pain in left arm: Secondary | ICD-10-CM

## 2022-07-13 DIAGNOSIS — N186 End stage renal disease: Secondary | ICD-10-CM

## 2022-07-20 ENCOUNTER — Ambulatory Visit (INDEPENDENT_AMBULATORY_CARE_PROVIDER_SITE_OTHER): Payer: 59

## 2022-07-20 DIAGNOSIS — N186 End stage renal disease: Secondary | ICD-10-CM | POA: Diagnosis not present

## 2022-07-20 DIAGNOSIS — M79602 Pain in left arm: Secondary | ICD-10-CM | POA: Diagnosis not present

## 2022-07-20 DIAGNOSIS — M7989 Other specified soft tissue disorders: Secondary | ICD-10-CM

## 2022-07-22 ENCOUNTER — Inpatient Hospital Stay
Admission: EM | Admit: 2022-07-22 | Discharge: 2022-07-27 | DRG: 064 | Disposition: A | Discharge status: Skilled Nursing Facility | Payer: 59 | Source: Skilled Nursing Facility | Attending: Internal Medicine | Admitting: Internal Medicine

## 2022-07-22 ENCOUNTER — Encounter: Payer: Self-pay | Admitting: Radiology

## 2022-07-22 ENCOUNTER — Emergency Department: Payer: 59

## 2022-07-22 ENCOUNTER — Inpatient Hospital Stay: Payer: 59

## 2022-07-22 ENCOUNTER — Other Ambulatory Visit: Payer: Self-pay

## 2022-07-22 DIAGNOSIS — Z7901 Long term (current) use of anticoagulants: Secondary | ICD-10-CM

## 2022-07-22 DIAGNOSIS — G8322 Monoplegia of upper limb affecting left dominant side: Secondary | ICD-10-CM | POA: Diagnosis present

## 2022-07-22 DIAGNOSIS — E1122 Type 2 diabetes mellitus with diabetic chronic kidney disease: Secondary | ICD-10-CM | POA: Diagnosis present

## 2022-07-22 DIAGNOSIS — I5030 Unspecified diastolic (congestive) heart failure: Secondary | ICD-10-CM | POA: Diagnosis not present

## 2022-07-22 DIAGNOSIS — M1990 Unspecified osteoarthritis, unspecified site: Secondary | ICD-10-CM | POA: Diagnosis present

## 2022-07-22 DIAGNOSIS — Z833 Family history of diabetes mellitus: Secondary | ICD-10-CM

## 2022-07-22 DIAGNOSIS — I10 Essential (primary) hypertension: Secondary | ICD-10-CM | POA: Diagnosis present

## 2022-07-22 DIAGNOSIS — R531 Weakness: Secondary | ICD-10-CM

## 2022-07-22 DIAGNOSIS — I6389 Other cerebral infarction: Secondary | ICD-10-CM | POA: Diagnosis not present

## 2022-07-22 DIAGNOSIS — Z87891 Personal history of nicotine dependence: Secondary | ICD-10-CM

## 2022-07-22 DIAGNOSIS — Z8701 Personal history of pneumonia (recurrent): Secondary | ICD-10-CM

## 2022-07-22 DIAGNOSIS — I890 Lymphedema, not elsewhere classified: Secondary | ICD-10-CM | POA: Diagnosis present

## 2022-07-22 DIAGNOSIS — N2581 Secondary hyperparathyroidism of renal origin: Secondary | ICD-10-CM | POA: Diagnosis present

## 2022-07-22 DIAGNOSIS — N186 End stage renal disease: Secondary | ICD-10-CM

## 2022-07-22 DIAGNOSIS — E119 Type 2 diabetes mellitus without complications: Secondary | ICD-10-CM

## 2022-07-22 DIAGNOSIS — Z961 Presence of intraocular lens: Secondary | ICD-10-CM | POA: Diagnosis present

## 2022-07-22 DIAGNOSIS — I63032 Cerebral infarction due to thrombosis of left carotid artery: Secondary | ICD-10-CM | POA: Diagnosis not present

## 2022-07-22 DIAGNOSIS — Z832 Family history of diseases of the blood and blood-forming organs and certain disorders involving the immune mechanism: Secondary | ICD-10-CM

## 2022-07-22 DIAGNOSIS — E559 Vitamin D deficiency, unspecified: Secondary | ICD-10-CM | POA: Diagnosis present

## 2022-07-22 DIAGNOSIS — Z794 Long term (current) use of insulin: Secondary | ICD-10-CM

## 2022-07-22 DIAGNOSIS — D6851 Activated protein C resistance: Secondary | ICD-10-CM | POA: Diagnosis present

## 2022-07-22 DIAGNOSIS — E785 Hyperlipidemia, unspecified: Secondary | ICD-10-CM | POA: Diagnosis present

## 2022-07-22 DIAGNOSIS — R471 Dysarthria and anarthria: Secondary | ICD-10-CM | POA: Diagnosis present

## 2022-07-22 DIAGNOSIS — G4733 Obstructive sleep apnea (adult) (pediatric): Secondary | ICD-10-CM | POA: Diagnosis present

## 2022-07-22 DIAGNOSIS — R29708 NIHSS score 8: Secondary | ICD-10-CM | POA: Diagnosis present

## 2022-07-22 DIAGNOSIS — D6859 Other primary thrombophilia: Secondary | ICD-10-CM | POA: Diagnosis present

## 2022-07-22 DIAGNOSIS — Z8673 Personal history of transient ischemic attack (TIA), and cerebral infarction without residual deficits: Secondary | ICD-10-CM | POA: Diagnosis not present

## 2022-07-22 DIAGNOSIS — I639 Cerebral infarction, unspecified: Secondary | ICD-10-CM | POA: Diagnosis not present

## 2022-07-22 DIAGNOSIS — I878 Other specified disorders of veins: Secondary | ICD-10-CM | POA: Diagnosis present

## 2022-07-22 DIAGNOSIS — R4781 Slurred speech: Secondary | ICD-10-CM | POA: Diagnosis present

## 2022-07-22 DIAGNOSIS — Z992 Dependence on renal dialysis: Secondary | ICD-10-CM

## 2022-07-22 DIAGNOSIS — I69351 Hemiplegia and hemiparesis following cerebral infarction affecting right dominant side: Secondary | ICD-10-CM

## 2022-07-22 DIAGNOSIS — Z6841 Body Mass Index (BMI) 40.0 and over, adult: Secondary | ICD-10-CM | POA: Diagnosis not present

## 2022-07-22 DIAGNOSIS — I5032 Chronic diastolic (congestive) heart failure: Secondary | ICD-10-CM | POA: Diagnosis present

## 2022-07-22 DIAGNOSIS — D631 Anemia in chronic kidney disease: Secondary | ICD-10-CM | POA: Diagnosis present

## 2022-07-22 DIAGNOSIS — Z515 Encounter for palliative care: Secondary | ICD-10-CM | POA: Diagnosis not present

## 2022-07-22 DIAGNOSIS — D869 Sarcoidosis, unspecified: Secondary | ICD-10-CM | POA: Diagnosis present

## 2022-07-22 DIAGNOSIS — I132 Hypertensive heart and chronic kidney disease with heart failure and with stage 5 chronic kidney disease, or end stage renal disease: Secondary | ICD-10-CM | POA: Diagnosis present

## 2022-07-22 DIAGNOSIS — Z881 Allergy status to other antibiotic agents status: Secondary | ICD-10-CM

## 2022-07-22 DIAGNOSIS — Z79899 Other long term (current) drug therapy: Secondary | ICD-10-CM

## 2022-07-22 DIAGNOSIS — I69392 Facial weakness following cerebral infarction: Secondary | ICD-10-CM | POA: Diagnosis not present

## 2022-07-22 LAB — COMPREHENSIVE METABOLIC PANEL
ALT: 16 U/L (ref 0–44)
AST: 25 U/L (ref 15–41)
Albumin: 2.6 g/dL — ABNORMAL LOW (ref 3.5–5.0)
Alkaline Phosphatase: 69 U/L (ref 38–126)
Anion gap: 9 (ref 5–15)
BUN: 18 mg/dL (ref 8–23)
CO2: 28 mmol/L (ref 22–32)
Calcium: 8.6 mg/dL — ABNORMAL LOW (ref 8.9–10.3)
Chloride: 101 mmol/L (ref 98–111)
Creatinine, Ser: 4.75 mg/dL — ABNORMAL HIGH (ref 0.44–1.00)
GFR, Estimated: 9 mL/min — ABNORMAL LOW (ref >60)
Glucose, Bld: 178 mg/dL — ABNORMAL HIGH (ref 70–99)
Potassium: 4.1 mmol/L (ref 3.5–5.1)
Sodium: 138 mmol/L (ref 135–145)
Total Bilirubin: 0.6 mg/dL (ref 0.3–1.2)
Total Protein: 7 g/dL (ref 6.5–8.1)

## 2022-07-22 LAB — DIFFERENTIAL
Abs Immature Granulocytes: 0.02 10*3/uL (ref 0.00–0.07)
Basophils Absolute: 0.1 10*3/uL (ref 0.0–0.1)
Basophils Relative: 1 %
Eosinophils Absolute: 0.3 10*3/uL (ref 0.0–0.5)
Eosinophils Relative: 3 %
Immature Granulocytes: 0 %
Lymphocytes Relative: 28 %
Lymphs Abs: 2.2 10*3/uL (ref 0.7–4.0)
Monocytes Absolute: 0.9 10*3/uL (ref 0.1–1.0)
Monocytes Relative: 12 %
Neutro Abs: 4.3 10*3/uL (ref 1.7–7.7)
Neutrophils Relative %: 56 %

## 2022-07-22 LAB — CBG MONITORING, ED: Glucose-Capillary: 162 mg/dL — ABNORMAL HIGH (ref 70–99)

## 2022-07-22 LAB — CBC
HCT: 31.5 % — ABNORMAL LOW (ref 36.0–46.0)
Hemoglobin: 9.2 g/dL — ABNORMAL LOW (ref 12.0–15.0)
MCH: 28.8 pg (ref 26.0–34.0)
MCHC: 29.2 g/dL — ABNORMAL LOW (ref 30.0–36.0)
MCV: 98.4 fL (ref 80.0–100.0)
Platelets: 186 10*3/uL (ref 150–400)
RBC: 3.2 MIL/uL — ABNORMAL LOW (ref 3.87–5.11)
RDW: 16.3 % — ABNORMAL HIGH (ref 11.5–15.5)
WBC: 7.7 10*3/uL (ref 4.0–10.5)
nRBC: 0 % (ref 0.0–0.2)

## 2022-07-22 LAB — ETHANOL: Alcohol, Ethyl (B): <10 mg/dL (ref <10)

## 2022-07-22 LAB — PROTIME-INR
INR: 1.2 (ref 0.8–1.2)
Prothrombin Time: 14.9 seconds (ref 11.4–15.2)

## 2022-07-22 LAB — APTT: aPTT: 29 seconds (ref 24–36)

## 2022-07-22 MED ORDER — PROSIGHT PO TABS
1.0000 | ORAL_TABLET | Freq: Two times a day (BID) | ORAL | Status: DC
  Administered 2022-07-23 – 2022-07-26 (×7): 1 via ORAL
  Filled 2022-07-22 (×11): qty 1

## 2022-07-22 MED ORDER — GABAPENTIN 100 MG PO CAPS
200.0000 mg | ORAL_CAPSULE | Freq: Two times a day (BID) | ORAL | Status: DC
  Administered 2022-07-23 – 2022-07-26 (×9): 200 mg via ORAL
  Filled 2022-07-22 (×9): qty 2

## 2022-07-22 MED ORDER — CARVEDILOL 25 MG PO TABS
25.0000 mg | ORAL_TABLET | Freq: Two times a day (BID) | ORAL | Status: DC
  Administered 2022-07-23 – 2022-07-27 (×7): 25 mg via ORAL
  Filled 2022-07-22 (×9): qty 1

## 2022-07-22 MED ORDER — STROKE: EARLY STAGES OF RECOVERY BOOK: Freq: Once | Status: AC

## 2022-07-22 MED ORDER — CALCIUM ACETATE (PHOS BINDER) 667 MG PO CAPS
667.0000 mg | ORAL_CAPSULE | Freq: Three times a day (TID) | ORAL | Status: DC
  Administered 2022-07-23 – 2022-07-27 (×12): 667 mg via ORAL
  Filled 2022-07-22 (×13): qty 1

## 2022-07-22 MED ORDER — HEPARIN SODIUM (PORCINE) 5000 UNIT/ML IJ SOLN
5000.0000 [IU] | Freq: Three times a day (TID) | INTRAMUSCULAR | Status: DC
  Administered 2022-07-23: 5000 [IU] via SUBCUTANEOUS
  Filled 2022-07-22: qty 1

## 2022-07-22 MED ORDER — DOCUSATE SODIUM 100 MG PO CAPS
100.0000 mg | ORAL_CAPSULE | Freq: Two times a day (BID) | ORAL | Status: DC
  Administered 2022-07-23 – 2022-07-26 (×8): 100 mg via ORAL
  Filled 2022-07-22 (×8): qty 1

## 2022-07-22 MED ORDER — TRAZODONE HCL 50 MG PO TABS
50.0000 mg | ORAL_TABLET | Freq: Every evening | ORAL | Status: DC | PRN
  Administered 2022-07-24 – 2022-07-26 (×3): 50 mg via ORAL
  Filled 2022-07-22 (×3): qty 1

## 2022-07-22 MED ORDER — INSULIN ASPART 100 UNIT/ML IJ SOLN
0.0000 [IU] | Freq: Three times a day (TID) | INTRAMUSCULAR | Status: DC
  Administered 2022-07-23 – 2022-07-27 (×2): 1 [IU] via SUBCUTANEOUS
  Filled 2022-07-22 (×4): qty 1

## 2022-07-22 MED ORDER — LATANOPROST 0.005 % OP SOLN
1.0000 [drp] | Freq: Every day | OPHTHALMIC | Status: DC
  Administered 2022-07-23 – 2022-07-26 (×5): 1 [drp] via OPHTHALMIC
  Filled 2022-07-22: qty 2.5

## 2022-07-22 MED ORDER — CALCITRIOL 0.25 MCG PO CAPS
0.2500 ug | ORAL_CAPSULE | Freq: Every day | ORAL | Status: DC
  Administered 2022-07-23 – 2022-07-26 (×4): 0.25 ug via ORAL
  Filled 2022-07-22 (×5): qty 1

## 2022-07-22 MED ORDER — ACETAMINOPHEN 325 MG PO TABS: 650.0000 mg | ORAL_TABLET | ORAL | Status: DC | PRN

## 2022-07-22 MED ORDER — ACETAMINOPHEN 650 MG RE SUPP: 650.0000 mg | RECTAL | Status: DC | PRN

## 2022-07-22 MED ORDER — INSULIN ASPART 100 UNIT/ML IJ SOLN: 0.0000 [IU] | Freq: Every day | INTRAMUSCULAR | Status: DC

## 2022-07-22 MED ORDER — POLYETHYLENE GLYCOL 3350 17 G PO PACK
17.0000 g | PACK | Freq: Two times a day (BID) | ORAL | Status: DC
  Administered 2022-07-23 – 2022-07-26 (×8): 17 g via ORAL
  Filled 2022-07-22 (×9): qty 1

## 2022-07-22 MED ORDER — IPRATROPIUM-ALBUTEROL 0.5-2.5 (3) MG/3ML IN SOLN
3.0000 mL | RESPIRATORY_TRACT | Status: DC | PRN
  Administered 2022-07-26: 3 mL via RESPIRATORY_TRACT
  Filled 2022-07-22: qty 3

## 2022-07-22 MED ORDER — INSULIN GLARGINE-YFGN 100 UNIT/ML ~~LOC~~ SOLN
10.0000 [IU] | Freq: Every day | SUBCUTANEOUS | Status: DC
  Administered 2022-07-22 – 2022-07-26 (×5): 10 [IU] via SUBCUTANEOUS
  Filled 2022-07-22 (×6): qty 0.1

## 2022-07-22 MED ORDER — HYDROCODONE-ACETAMINOPHEN 5-325 MG PO TABS: 1.0000 | ORAL_TABLET | Freq: Three times a day (TID) | ORAL | Status: DC | PRN

## 2022-07-22 MED ORDER — ACETAMINOPHEN 160 MG/5ML PO SOLN: 650.0000 mg | ORAL | Status: DC | PRN

## 2022-07-22 MED ORDER — ATORVASTATIN CALCIUM 80 MG PO TABS
80.0000 mg | ORAL_TABLET | Freq: Every day | ORAL | Status: DC
  Administered 2022-07-23 – 2022-07-26 (×4): 80 mg via ORAL
  Filled 2022-07-22 (×2): qty 1
  Filled 2022-07-22: qty 4
  Filled 2022-07-22: qty 1

## 2022-07-22 MED ORDER — TORSEMIDE 20 MG PO TABS
10.0000 mg | ORAL_TABLET | Freq: Every day | ORAL | Status: DC
  Administered 2022-07-23 – 2022-07-26 (×4): 10 mg via ORAL
  Filled 2022-07-22 (×4): qty 1

## 2022-07-22 NOTE — ED Provider Notes (Signed)
[facility] Provider Note   Event Date/Time   First MD Initiated Contact with Patient 07/22/22 2001     (approximate) History  Code Stroke  HPI Jody Nelson is a 72 y.o. female with a history of prior CVA with right-sided deficits who presents for slurred speech and left upper extremity weakness that began at 1830 today.  EMS states that patient was in her normal state of health at 1820 prior to staff finding her at [place] dysarthric began stating that it was difficult for her to move her left upper arm. ROS: Patient currently denies any vision changes, tinnitus, difficulty speaking, facial droop, sore throat, chest pain, shortness of breath, abdominal pain, nausea/vomiting/diarrhea, dysuria, or paresthesias in any extremity   Physical Exam  Triage Vital Signs: ED Triage Vitals  Enc Vitals Group     BP 07/22/22 2025 (!) 144/59     Pulse Rate 07/22/22 2025 83     Resp --      Temp 07/22/22 2025 98.6 F (37 C)     Temp Source 07/22/22 2025 Oral     SpO2 07/22/22 2025 100 %     Weight 07/22/22 2026 300 lb (136.1 kg)     Height 07/22/22 2026 5\' 4"  (1.626 m)     Head Circumference --      Peak Flow --      Pain Score 07/22/22 2026 0     Pain Loc --      Pain Edu? --      Excl. in [place]? --    Most recent vital signs: Vitals:   07/22/22 2200 07/22/22 2230  BP: (!) 117/35 (!) 128/55  Pulse: 88 83  Resp: (!) 28 17  Temp:    SpO2: 100% 100%   General: Awake, oriented x4. CV:  Good peripheral perfusion.  Resp:  Normal effort.  Abd:  No distention.  Other:  Elderly obese African-American female laying in bed in no acute distress.  Dysarthria, bilateral upper extremity weakness ED Results / Procedures / Treatments  Labs (all labs ordered are listed, but only abnormal results are displayed) Labs Reviewed  CBC - Abnormal; Notable for the following components:      Result Value   RBC 3.20 (*)    Hemoglobin 9.2 (*)    HCT 31.5 (*)    MCHC 29.2 (*)    RDW  16.3 (*)    All other components within normal limits  COMPREHENSIVE METABOLIC PANEL - Abnormal; Notable for the following components:   Glucose, Bld 178 (*)    Creatinine, Ser 4.75 (*)    Calcium 8.6 (*)    Albumin 2.6 (*)    GFR, Estimated 9 (*)    All other components within normal limits  CBG MONITORING, ED - Abnormal; Notable for the following components:   Glucose-Capillary 162 (*)    All other components within normal limits  ETHANOL  PROTIME-INR  APTT  DIFFERENTIAL  URINE DRUG SCREEN, QUALITATIVE ([facility] ONLY)  URINALYSIS, ROUTINE W REFLEX MICROSCOPIC  LIPID PANEL   EKG ED ECG REPORT I, Naaman Plummer, the attending physician, personally viewed and interpreted this ECG. Date: 07/22/2022 EKG Time: 2006 Rate: 83 Rhythm: normal sinus rhythm QRS Axis: normal Intervals: normal ST/T Wave abnormalities: normal Narrative Interpretation: no evidence of acute ischemia RADIOLOGY ED MD interpretation: CT of the head without contrast interpreted by me shows no evidence of acute abnormalities including no intracerebral hemorrhage, obvious masses, or significant edema -Agree with radiology assessment  Official radiology report(s): MR BRAIN WO CONTRAST  Result Date: 07/22/2022 CLINICAL DATA:  Acute neurologic deficit EXAM: MRI HEAD WITHOUT CONTRAST TECHNIQUE: Multiplanar, multiecho pulse sequences of the brain and surrounding structures were obtained without intravenous contrast. COMPARISON:  Brain MRI 03/02/2022 FINDINGS: Brain: Small acute infarct of the right centrum semiovale. No acute hemorrhage or mass effect. Numerous chronic microhemorrhages in a mixed central and peripheral distribution. 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 Normal white matter signal, parenchymal volume and CSF spaces. The midline structures are normal. Vascular: Normal flow voids. Skull and upper cervical spine: Normal marrow signal. Sinuses/Orbits: Negative. Other: None. IMPRESSION: 1. Small acute  infarct of the right centrum semiovale. 2. Numerous chronic microhemorrhages in a mixed central and peripheral distribution, which could be due to chronic hypertensive or cerebral amyloid angiopathy. 3. 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 Electronically Signed   By: Ulyses Jarred M.D.   On: 07/22/2022 23:38   CT HEAD CODE STROKE WO CONTRAST  Result Date: 07/22/2022 CLINICAL DATA:  Code stroke.  Left-sided weakness EXAM: CT HEAD WITHOUT CONTRAST TECHNIQUE: Contiguous axial images were obtained from the base of the skull through the vertex without intravenous contrast. RADIATION DOSE REDUCTION: This exam was performed according to the departmental dose-optimization program which includes automated exposure control, adjustment of the mA and/or kV according to patient size and/or use of iterative reconstruction technique. COMPARISON:  05/01/2022 FINDINGS: Brain: There is no mass, hemorrhage or extra-axial collection. The size and configuration of the ventricles and extra-axial CSF spaces are normal. 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 Vascular: No abnormal hyperdensity of the major intracranial arteries or dural venous sinuses. No intracranial atherosclerosis. Skull: The visualized skull base, calvarium and extracranial soft tissues are normal. Sinuses/Orbits: No fluid levels or advanced mucosal thickening of the visualized paranasal sinuses. No mastoid or middle ear effusion. The orbits are normal. ASPECTS [facility] Stroke Program Early CT Score) - Ganglionic level infarction (caudate, lentiform nuclei, internal capsule, insula, M1-M3 cortex): 7 - Supraganglionic infarction (M4-M6 cortex): 3 Total score (0-10 with 10 being normal): 10 IMPRESSION: 1. No acute intracranial abnormality. 2. 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 3. ASPECTS is 10. These results were called by telephone at the time of interpretation on  07/22/2022 at 8:01 pm to provider [facility] , who verbally acknowledged these results. Electronically Signed   By: Ulyses Jarred M.D.   On: 07/22/2022 20:01   PROCEDURES: Critical Care performed: No Procedures MEDICATIONS ORDERED IN ED: Medications  polyethylene glycol (MIRALAX / GLYCOLAX) packet 17 g (has no administration in time range)  HYDROcodone-acetaminophen (NORCO/VICODIN) 5-325 MG per tablet 1 tablet (has no administration in time range)  atorvastatin (LIPITOR) tablet 80 mg (has no administration in time range)  carvedilol (COREG) tablet 25 mg (has no administration in time range)  torsemide (DEMADEX) tablet 10 mg (has no administration in time range)  traZODone (DESYREL) tablet 50 mg (has no administration in time range)  insulin glargine-yfgn (SEMGLEE) injection 10 Units (10 Units Subcutaneous Given 07/22/22 2355)  calcitRIOL (ROCALTROL) capsule 0.25 mcg (has no administration in time range)  calcium acetate (PHOSLO) capsule 667 mg (has no administration in time range)  docusate sodium (COLACE) capsule 100 mg (has no administration in time range)  gabapentin (NEURONTIN) capsule 200 mg (has no administration in time range)  multivitamin (PROSIGHT) tablet 1 tablet (has no administration in time range)  ipratropium-albuterol (DUONEB) 0.5-2.5 (3) MG/3ML nebulizer solution 3 mL (has  no administration in time range)  latanoprost (XALATAN) 0.005 % ophthalmic solution 1 drop (has no administration in time range)   stroke: early stages of recovery book (has no administration in time range)  acetaminophen (TYLENOL) tablet 650 mg (has no administration in time range)    Or  acetaminophen (TYLENOL) 160 MG/5ML solution 650 mg (has no administration in time range)    Or  acetaminophen (TYLENOL) suppository 650 mg (has no administration in time range)  heparin injection 5,000 Units (has no administration in time range)  insulin aspart (novoLOG) injection 0-6 Units (has no administration in  time range)  insulin aspart (novoLOG) injection 0-5 Units (has no administration in time range)   IMPRESSION / MDM / ASSESSMENT AND PLAN / ED COURSE  I reviewed the triage vital signs and the nursing notes.                             The patient is on the cardiac monitor to evaluate for evidence of arrhythmia and/or significant heart rate changes. Patient's presentation is most consistent with acute presentation with potential threat to life or bodily function. Patient is a 72 year old female who presents with symptoms concerning for CVA with code stroke called prior to arrival PMH risk factors: CVA, hypertension, hyperlipidemia Neurologic Deficits: Dysarthria, left upper extremity weakness Last known Well Time: 1830 NIH Stroke Score: 2 Given History and Exam I have lower suspicion for infectious etiology, neurologic changes secondary to toxicologic ingestion, seizure, complex migraine. Presentation concerning for possible stroke requiring workup.  Workup: Labs: POC glucose, CBC, BMP, LFTs, Troponin, PT/INR, PTT, Type and Screen Other Diagnostics: ECG, CXR, non-contrast head CT followed by CTA brain and neck (to r/o large vessel occlusion amenable to thrombectomy) Interventions: Patient's not eligible for TPA due to Eliquis use  Consult: hospitalist Disposition: Admit   FINAL CLINICAL IMPRESSION(S) / ED DIAGNOSES   Final diagnoses:  Slurred speech  Left-sided weakness   Rx / DC Orders   ED Discharge Orders     None      Note:  This document was prepared using Dragon voice recognition software and may include unintentional dictation errors.   Naaman Plummer, MD 07/23/22 [phone]

## 2022-07-22 NOTE — ED Triage Notes (Signed)
Pt from [facility] to [facility] via [place] w/ c/o Code Stroke. LKW was at 1820 symptoms started at 1830 when pt was trying to eat dinner and could not grip and food was falling out of her mouth. Code stroke was activated via EMS. Pt has an LVO of 1 and LH Grip weakness, Pt developed Slurred speech enroute. Pt has a hx of 6prior strokes and is currently taking Eliquis. VS Enroute were BP 120/60, HR 84, SPO2 100RA.

## 2022-07-22 NOTE — Progress Notes (Signed)
Code stroke timeline [id number] LKW, 1830 onset of s/s MRS 4 1959 Code stroke cart activated, NCCT obtained and EDP assessed prior to cart activation.  2001 Neuro TSMD paged 2005 Neuro TSMD on camera 2020 Neuro TSMD off camera, TSRN off camera

## 2022-07-22 NOTE — H&P (Signed)
History and Physical    Patient: Jody Nelson [id number] DOB: [birth year] DOA: 07/22/2022 DOS: the patient was seen and examined on 07/22/2022 PCP: Patient, No Pcp Per  Patient coming from: Home  Chief Complaint:  Chief Complaint  Patient presents with   Code Stroke    HPI: Jody Nelson is a 72 y.o. female with medical history significant for hypercoagulable state with multiple cryptogenic CVAs,on Eliquis, ESRD on HD MWF, anemia of CKD, DMII , obesity with BMI over 50, OSA (not on CPAP), hypertension, HFpEF, biopsy-proven sarcoidosis, history of right tibial plateau fracture 2019 with hardware and chronic wound infection, chronic bilateral lymphedema hospitalized on month ago from 2/3 to 2/11 with cellulitis and abscess of the right leg s/p I&D and on wound VAC and discharged to rehab who presented to the ED as a code stroke after she was noted to be slurring her speech while at the dinner table.  She also had a new left arm weakness and had difficulty using her left arm to feed herself. Head CT code stroke protocol showed no acute hemorrhage or acute core infarct.  NIH was below 6 given her baseline functional deficit.  She was seen by teleneurology who did not think her findings were consistent with LVO and they recommended getting an MRI and holding anticoagulation until MRI can determine the presence of stroke and size thereof (please see note for details). While in the ER, patient started regaining some of the initial deficits. Additional ED data review: Vitals unremarkable.  Labs unremarkable.  EKG, independently interpreted with sinus at 83 and no acute changes. Hospitalist consulted for admission.  MRI pending at time of request for admission.  At the time of my evaluation, patient continued to have slurred speech albeit improving and had significant improvement in her left upper extremity weakness, almost back to baseline.  Review of Systems: As mentioned in the history of present  illness. All other systems reviewed and are negative.  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   At high risk for falls    Atypical chest pain    Bilateral lower extremity edema    Chronic right sided weakness    Cryptogenic stroke ([place]) 02/27/2011   a.) MRI brain 02/27/2011: old lacunar infarct in RIGHT pons and medulla oblongata   Cryptogenic stroke ([place]) 10/11/2012   a.) CT brain 10/11/2012: tiny old lacunar infarct in head of caudate nucleus (left)   Cryptogenic stroke ([place]) 10/12/2012   a.) MRI brain 10/12/2012: multiple acute LEFT hemispheric infarcts   Cryptogenic stroke ([place]) 06/26/2014   a.) MRI brain 06/26/2014: acute RIGHT posterior frontal lobe infarct involving both and grey matter   Cryptogenic stroke ([place]) 07/29/2015   a.) MRI brain 07/29/2015: 2 foci of diffusion restriction in the LEFT parietal lobe consistent with acute infarction   Cryptogenic stroke ([place]) 07/24/2017   a.) MRI brain 07/24/2017: punctate focus of acute ischemia in the medial LEFT temporal lobe   DDD (degenerative disc disease), cervical    Depression    DOE (dyspnea on exertion)  Dysarthria as late effect of stroke    ESRD (end stage renal disease) on dialysis ([place])    a.) M-W-F   GERD (gastroesophageal reflux disease)    Hallux valgus, bilateral    Heterozygous factor V Leiden mutation ([place]) 07/18/2018   History of cardiac catheterization 08/30/2008   a.) LHC 08/30/2008: EF >60%, LVEDP 18 mmHg, normal coronaries.   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   a.) 07/18/2018 --> 22 mol/L   Hypertension    Insomnia    a.) melatonin + trazodone PRN   Long term current use of anticoagulant    a.) apixaban   Lymphedema of both lower extremities    Meralgia paresthetica, left lower limb    OSA (obstructive sleep apnea)    a.) does not require nocturnal PAP therapy   Osteoarthritis    Pneumonia    Pseudophakia of both eyes    Sarcoidosis    Secondary hyperparathyroidism of renal origin ([place])    Type 2 diabetes mellitus treated with insulin ([place])    Vitamin D deficiency    Past Surgical History:  Procedure Laterality Date   APPLICATION OF WOUND VAC Right 06/15/2022   Procedure: APPLICATION OF WOUND VAC;  Surgeon: Leim Fabry, MD;  Location: [facility];  Service: Orthopedics;  Laterality: Right;   AV FISTULA PLACEMENT Left 04/16/2022   Procedure: INSERTION OF ARTERIOVENOUS (AV) GORE-TEX GRAFT ARM (BRACHIAL AXILLARY);  Surgeon: Katha Cabal, MD;  Location: [facility];  Service: Vascular;  Laterality: Left;   CESAREAN SECTION     DIALYSIS/PERMA CATHETER INSERTION N/A 10/14/2021   Procedure: DIALYSIS/PERMA CATHETER INSERTION;  Surgeon: Algernon Huxley, MD;  Location: [place] [facility];  Service: Cardiovascular;  Laterality: N/A;   FRACTURE SURGERY     leg right fracture several surgeries, rods   INCISION AND DRAINAGE OF WOUND Right 06/15/2022   Procedure: IRRIGATION AND DEBRIDEMENT WOUND;  Surgeon: Leim Fabry, MD;  Location: [facility];  Service: Orthopedics;  Laterality: Right;   Social History:  reports that she has quit smoking. Her smoking use included cigarettes. She has never used smokeless tobacco. She reports that she does not drink alcohol and does not use drugs.  Allergies   Allergen Reactions   Sulfamethoxazole-Trimethoprim     Other reaction(s): Kidney Disorder Other reaction(s): Kidney Disorder Hyperkalemia, AKI Hyperkalemia, AKI     Family History  Problem Relation Age of Onset   Diabetes Mother    Colon cancer Sister    Diabetes Sister    Cancer Maternal Uncle    Clotting disorder Paternal Grandmother    Colon cancer Paternal Grandfather     Prior to Admission medications   Medication Sig Start Date End Date Taking? Authorizing Provider  acetaminophen (TYLENOL) 325 MG tablet Take 2 tablets by mouth every 8 (eight) hours as needed.    [provider]  ACIDOPHILUS LACTOBACILLUS PO Take 1 capsule by mouth 3 (three) times daily.    [provider]  atorvastatin (LIPITOR) 80 MG tablet Take 80 mg by mouth daily. 02/19/21   [provider]  calcitRIOL (ROCALTROL) 0.25 MCG capsule Take 0.25 mcg by mouth daily. 10/22/21   [provider]  calcium acetate (PHOSLO) 667 MG capsule Take one capsule by mouth twice daily with food on Mondays, Wednesdays, and  Fridays. Take one capsule 3 times daily on Tuesdays, Thursdays, Saturdays, and Sundays. 06/10/22   [provider]  Calcium Carb-Cholecalciferol (OYSTER SHELL CALCIUM W/D) 500-5 MG-MCG TABS Take 1 tablet by mouth daily as needed. 02/19/21   [provider]  carvedilol (COREG) 25 MG tablet Take 1 tablet by mouth 2 (two) times daily with a meal. 02/19/21   [provider]  ceFEPime (MAXIPIME) IVPB Inject 2 g into the vein every Monday, Wednesday, and Friday with hemodialysis. Cefepime 2gm IV after hemodialysis on MWF Indication:  polymicrobial left leg infection Last Day of Therapy:  06/28/2022 To be given at HD center 06/21/22   Sharen Hones, MD  diclofenac Sodium (VOLTAREN) 1 % GEL Apply topically. 10/14/21   [provider]  docusate sodium (COLACE) 100 MG capsule Take 100 mg by mouth 2 (two) times daily.    [provider]  ferrous  sulfate 324 (65 Fe) MG TBEC Take 1 tablet by mouth daily. 02/19/21   [provider]  folic acid (FOLVITE) 1 MG tablet Take 1 mg by mouth daily. 02/19/21   [provider]  gabapentin (NEURONTIN) 100 MG capsule Take 2 capsules by mouth 2 (two) times daily. 02/19/21   [provider]  HYDROcodone-acetaminophen (NORCO/VICODIN) 5-325 MG tablet Take 1 tablet by mouth every 8 (eight) hours as needed for moderate pain. 06/18/22   Sharen Hones, MD  insulin glargine (LANTUS) 100 UNIT/ML Solostar Pen Inject 10 Units into the skin at bedtime. 02/17/21   [provider]  insulin lispro (HUMALOG) 100 UNIT/ML injection Inject 2-12 Units into the skin in the morning, at noon, and at bedtime. Per sliding scale    [provider]  ipratropium-albuterol (DUONEB) 0.5-2.5 (3) MG/3ML SOLN Take by nebulization. Patient not taking: Reported on 06/12/2022 05/18/21   [provider]  latanoprost (XALATAN) 0.005 % ophthalmic solution Place 1 drop into both eyes at bedtime. 02/08/22   [provider]  LIDOCAINE PAIN RELIEF 4 % 1 patch daily. 12 hours on, 12 hours off. 11/25/21   [provider]  metroNIDAZOLE (METROGEL) 1 % gel Apply topically. 02/03/22   [provider]  montelukast (SINGULAIR) 10 MG tablet Take 10 mg by mouth daily. 02/19/21   [provider]  Multiple Vitamins-Minerals (PRESERVISION AREDS) TABS Take 250 mg by mouth 2 (two) times daily.    [provider]  omeprazole (PRILOSEC) 10 MG capsule Take 10 mg by mouth daily. 02/25/22   [provider]  polyethylene glycol (MIRALAX / GLYCOLAX) 17 g packet Take 17 g by mouth daily.    [provider]  torsemide (DEMADEX) 10 MG tablet Take 10 mg by mouth daily. 10/22/21   [provider]  traZODone (DESYREL) 50 MG tablet Take 1 tablet by mouth at bedtime as needed. 08/22/20   [provider]    Physical Exam: Vitals:   07/22/22 2025  07/22/22 2026  BP: (!) 144/59   Pulse: 83   Temp: 98.6 F (37 C)   TempSrc: Oral   SpO2: 100%   Weight:  136.1 kg  Height:  5\' 4"  (1.626 m)   Physical Exam Vitals and nursing note reviewed.  Constitutional:      General: She is not in acute distress.    Appearance: She is morbidly obese.  HENT:     Head: Normocephalic and atraumatic.  Cardiovascular:     Rate and Rhythm: Normal rate and regular rhythm.     Heart sounds: Normal heart sounds.  Pulmonary:     Effort: Pulmonary effort is normal.     Breath sounds: Normal breath sounds.  Abdominal:     Palpations: Abdomen is soft.     Tenderness: There is no abdominal tenderness.  Skin:    Comments: Multiple wounds on right leg, see pic below  Neurological:     Mental Status: She is alert.     Cranial Nerves: Dysarthria and facial asymmetry present.     Motor: Pronator drift present.     Comments: Right facial droop        Labs on Admission: I have personally reviewed following labs and imaging studies  CBC: Recent Labs  Lab 07/22/22 2030  WBC 7.7  NEUTROABS 4.3  HGB 9.2*  HCT 31.5*  MCV 98.4  PLT 99991111   Basic Metabolic Panel: Recent Labs  Lab 07/22/22 2030  NA 138  K 4.1  CL 101  CO2 28  GLUCOSE 178*  BUN 18  CREATININE 4.75*  CALCIUM 8.6*   GFR: Estimated Creatinine Clearance: 15.2 mL/min (A) (by C-G formula based on SCr of 4.75 mg/dL (H)). Liver Function Tests: Recent Labs  Lab 07/22/22 2030  AST 25  ALT 16  ALKPHOS 69  BILITOT 0.6  PROT 7.0  ALBUMIN 2.6*   No results for input(s): "LIPASE", "AMYLASE" in the last 168 hours. No results for input(s): "AMMONIA" in the last 168 hours. Coagulation Profile: Recent Labs  Lab 07/22/22 2030  INR 1.2   Cardiac Enzymes: No results for input(s): "CKTOTAL", "CKMB", "CKMBINDEX", "TROPONINI" in the last 168 hours. BNP (last 3 results) No results for input(s): "PROBNP" in the last 8760 hours. HbA1C: No results for input(s): "HGBA1C" in the last  72 hours. CBG: Recent Labs  Lab 07/22/22 1949  GLUCAP 162*   Lipid Profile: No results for input(s): "CHOL", "HDL", "LDLCALC", "TRIG", "CHOLHDL", "LDLDIRECT" in the last 72 hours. Thyroid Function Tests: No results for input(s): "TSH", "T4TOTAL", "FREET4", "T3FREE", "THYROIDAB" in the last 72 hours. Anemia Panel: No results for input(s): "VITAMINB12", "FOLATE", "FERRITIN", "TIBC", "IRON", "RETICCTPCT" in the last 72 hours. Urine analysis: No results found for: "COLORURINE", "APPEARANCEUR", "LABSPEC", "PHURINE", "GLUCOSEU", "HGBUR", "BILIRUBINUR", "KETONESUR", "PROTEINUR", "UROBILINOGEN", "NITRITE", "LEUKOCYTESUR"  Radiological Exams on Admission: MR BRAIN WO CONTRAST  Result Date: 07/22/2022 CLINICAL DATA:  Acute neurologic deficit EXAM: MRI HEAD WITHOUT CONTRAST TECHNIQUE: Multiplanar, multiecho pulse sequences of the brain and surrounding structures were obtained without intravenous contrast. COMPARISON:  Brain MRI 03/02/2022 FINDINGS: Brain: Small acute infarct of the right centrum semiovale. No acute hemorrhage or mass effect. Numerous chronic microhemorrhages in a mixed central and peripheral distribution. 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 Normal white matter signal, parenchymal volume and CSF spaces. The midline structures are normal. Vascular: Normal flow voids. Skull and upper cervical spine: Normal marrow signal. Sinuses/Orbits: Negative. Other: None. IMPRESSION: 1. Small acute infarct of the right centrum semiovale. 2. Numerous chronic microhemorrhages in a mixed central and peripheral distribution, which could be due to chronic hypertensive or cerebral amyloid angiopathy. 3. 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 Electronically Signed   By: Ulyses Jarred M.D.   On: 07/22/2022 23:38   CT HEAD CODE STROKE WO CONTRAST  Result Date: 07/22/2022 CLINICAL DATA:  Code stroke.  Left-sided weakness EXAM: CT HEAD WITHOUT CONTRAST TECHNIQUE: Contiguous axial images were  obtained from the base of the skull through the vertex without intravenous contrast. RADIATION DOSE REDUCTION: This exam was performed according to the departmental dose-optimization program which includes automated exposure control, adjustment of the  mA and/or kV according to patient size and/or use of iterative reconstruction technique. COMPARISON:  05/01/2022 FINDINGS: Brain: There is no mass, hemorrhage or extra-axial collection. The size and configuration of the ventricles and extra-axial CSF spaces are normal. 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 Vascular: No abnormal hyperdensity of the major intracranial arteries or dural venous sinuses. No intracranial atherosclerosis. Skull: The visualized skull base, calvarium and extracranial soft tissues are normal. Sinuses/Orbits: No fluid levels or advanced mucosal thickening of the visualized paranasal sinuses. No mastoid or middle ear effusion. The orbits are normal. ASPECTS [facility] Stroke Program Early CT Score) - Ganglionic level infarction (caudate, lentiform nuclei, internal capsule, insula, M1-M3 cortex): 7 - Supraganglionic infarction (M4-M6 cortex): 3 Total score (0-10 with 10 being normal): 10 IMPRESSION: 1. No acute intracranial abnormality. 2. 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 3. ASPECTS is 10. These results were called by telephone at the time of interpretation on 07/22/2022 at 8:01 pm to provider [facility] , who verbally acknowledged these results. Electronically Signed   By: Ulyses Jarred M.D.   On: 07/22/2022 20:01     Data Reviewed: Relevant notes from primary care and specialist visits, past discharge summaries as available in EHR, including Care Everywhere. Prior diagnostic testing as pertinent to current admission diagnoses Updated medications and problem lists for reconciliation ED course, including vitals, labs, imaging, treatment and response to  treatment Triage notes, nursing and pharmacy notes and ED provider's notes Notable results as noted in HPI   Assessment and Plan:  Acute CVA right centrum semiovale History of multiple cryptogenic CVAs  Chronic anticoagulation secondary to hypercoagulability - Initial head CT negative - [place] Eliquis with full dose aspirin as advised by teleneurology until ruled out for large stroke by MRI -Other teleneurology recommendations as follows:        Stroke/Telemetry Floor       Neuro Checks       Bedside Swallow Eval       DVT Prophylaxis       IV Fluids, Normal Saline       Head of Bed 30 Degrees       Euglycemia and Avoid Hyperthermia (PRN Acetaminophen) -Patient underwent coagulability workup in March 2020 for cryptogenic strokes remarkable for low positive anticardiolipin IgM, elevated homocysteine, and Factor V Leiden Deficiency. No h/o venous blood clots.  -Addendum: MRI showing acute CVA right centrum semiovale and numerous chronic microhemorrhages -Will defer to neurology for reinitiation of Eliquis in the a.m.    Type 2 diabetes mellitus , with long-term current use of insulin (HCC) Continue basal insulin Sliding scale coverage Get A1c   ESRD on hemodialysis [facility]) Nephrology consult for continuation of dialysis    Anemia of chronic renal failure Stable   Sarcoidosis Biopsy proven   Morbid obesity with BMI of 50.0-59.9, adult (HCC) Complicating factor to overall prognosis and care   Essential hypertension Blood pressure controlled Continue carvedilol   History of obstructive sleep apnea CPAP nightly    (HFpEF) heart failure with preserved ejection fraction (HCC) Clinically euvolemic Continue Demadex, carvedilol   History of right tibial plateau fracture 2019 with hardware and Chronic wound  right leg Chronic lymphedema Keep legs elevated to assist with swelling.  Continue Demadex Wound care consult   DVT prophylaxis: Heparin  Consults: Neurology, Dr.  Quinn Axe, nephrology, Dr. Zollie Scale  Advance Care Planning:   Code Status: Prior   Family Communication: none  Disposition Plan:  Back to previous home environment  Severity of Illness: The appropriate patient status for this patient is INPATIENT. Inpatient status is judged to be reasonable and necessary in order to provide the required intensity of service to ensure the patient's safety. The patient's presenting symptoms, physical exam findings, and initial radiographic and laboratory data in the context of their chronic comorbidities is felt to place them at high risk for further clinical deterioration. Furthermore, it is not anticipated that the patient will be medically stable for discharge from the hospital within 2 midnights of admission.   * I certify that at the point of admission it is my clinical judgment that the patient will require inpatient hospital care spanning beyond 2 midnights from the point of admission due to high intensity of service, high risk for further deterioration and high frequency of surveillance required.*  Author: Athena Masse, MD 07/22/2022 10:38 PM  For on call review [email or web address].

## 2022-07-22 NOTE — Consult Note (Signed)
Initial Consultation Note   Patient: Jody Nelson [id number] DOB: October 16, 1950 PCP: Patient, No Pcp Per DOA: 07/22/2022 DOS: the patient was seen and examined on 07/22/2022 Primary service: Naaman Plummer, MD [facility] Tele[facility]urology [facility]   Patient Name:   Jody Nelson Date of Birth:   1950/07/06 Identification Number:   MRN - [id number] Date of Service:   07/22/2022 20:01:49  Diagnosis:       I63.00 - Cerebrovascular accident (CVA) due to thrombosis of precerebral artery (HCCC)  Impression:      the patient's new left arm weakness and slurred speech are certainly concerning for a new ischemic event. With her low NIH when taking into account her chronic deficits her exam is not consistent with probable large vessel occlusion NIH far below 6. She also has a poor functional baseline.    for now would hold full anticoagulation she can be bridged with full strength aspirin this evening until MRI brain can't confirm presence of stroke and the size to determine when it safe to resume  Our recommendations are outlined below.  Recommendations:        Stroke/Telemetry Floor       Neuro Checks       Bedside Swallow Eval       DVT Prophylaxis       IV Fluids, Normal Saline       Head of Bed 30 Degrees       Euglycemia and Avoid Hyperthermia (PRN Acetaminophen)       Initiate or continue Aspirin 325 MG daily  Sign Out:       Discussed with Emergency Department Provider    ------------------------------------------------------------------------------  Advanced Imaging: Advanced Imaging Deferred because:  exam not consistent with new large vessel occlusion   Metrics: Last Known Well: 07/22/2022 18:20:00 [facility] Notification Time: 07/22/2022 20:01:49 Arrival Time: 07/22/2022 19:48:00 Stamp Time: 07/22/2022 20:01:49 Initial Response Time: 07/22/2022 20:05:14 Symptoms: left arm weakness and slurred speech. Initial patient interaction:  07/22/2022 20:10:00 NIHSS Assessment Completed: 07/22/2022 20:20:00 Patient is not a candidate for Thrombolytic. Thrombolytic Medical Decision: 07/22/2022 20:20:00 Patient was not deemed candidate for Thrombolytic because of following reasons: Use of NOAs within 48 hours.  CT head showed no acute hemorrhage or acute core infarct.  Primary Provider Notified of Diagnostic Impression and Management Plan on: 07/22/2022 20:27:27    ------------------------------------------------------------------------------  History of Present Illness: Patient is a 72 year old Female.  Patient was brought by EMS for symptoms of left arm weakness and slurred speech.  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 She's able to ambulate with a walker very short distances and uses a power chair. 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  Apparently she was at her nursing facility and she was eating at approximately 18:20. She was having some trouble using her left arm and had some new slurred speech. No other new neurologic findings compared to her baseline.  she also has diabetes and sarcoidosis end-stage renal disease on dialysis   Past Medical History:      Hypertension      Diabetes Mellitus      Stroke  Medications:  Anticoagulant use:  Yes Eliquis No Antiplatelet use Reviewed EMR for current medications  Allergies:  Reviewed  Social History: Drug Use: No  Family History:  There is no family history of premature cerebrovascular disease  pertinent to this consultation  ROS : 14 Points Review of Systems was performed and was negative except mentioned in HPI.  Past Surgical History: There Is No Surgical History Contributory To Today's Visit    Examination: BP(144//59), Pulse(75), 1A: Level of Consciousness  - Alert; keenly responsive + 0 1B: Ask Month and Age - Both Questions Right + 0 1C: Blink Eyes & Squeeze Hands - Performs Both Tasks + 0 2: Test Horizontal Extraocular Movements - Normal + 0 3: Test Visual Fields - No Visual Loss + 0 4: Test Facial Palsy (Use Grimace if Obtunded) - Minor paralysis (flat nasolabial fold, smile asymmetry) + 1 5A: Test Left Arm Motor Drift - No Drift for 10 Seconds + 0 5B: Test Right Arm Motor Drift - Some Effort Against Gravity + 2 6A: Test Left Leg Motor Drift - No Drift for 5 Seconds + 0 6B: Test Right Leg Motor Drift - No Drift for 5 Seconds + 0 7: Test Limb Ataxia (FNF/Heel-Shin) - Ataxia in 1 Limb + 1 8: Test Sensation - Mild-Moderate Loss: Less Sharp/More Dull + 1 9: Test Language/Aphasia - Normal; No aphasia + 0 10: Test Dysarthria - Mild-Moderate Dysarthria: Slurring but can be understood + 1 11: Test Extinction/Inattention - No abnormality + 0  NIHSS Score: 6  NIHSS Free Text : right arm numbness  when taking into account her baseline deficits from her old stroke her NIH is 2 for slurred speech and left upper extremity incoordination the other findings are chronic  Pre-Morbid Modified Rankin Scale: 3 Points = Moderate disability; requiring some help, but able to walk without assistance  Spoke with : emergency department physician Dr. Cheri Fowler  Patient/Family was informed the Neurology Consult would occur via TeleHealth consult by way of interactive audio and video telecommunications and consented to receiving care in this manner.   Patient is being evaluated for possible acute neurologic impairment and high probability of imminent or life-threatening deterioration. I spent total of 35 minutes providing care to this patient, including time for face to face visit via telemedicine, review of medical records, imaging studies and discussion of findings with providers, the patient and/or family.   Dr Katina Degree   [facility] For  Inpatient follow-up with [facility] physician please call [facility] [phone]. This is not an outpatient service. Post hospital discharge, please contact hospital directly.  Please do not communicate with [facility] physicians via secure chat. If you have any questions, Please contact [facility]. Please call or reconsult our service if there are any clinical or diagnostic changes.

## 2022-07-22 NOTE — ED Notes (Signed)
Pt passed her 3oz water swallow screen

## 2022-07-22 NOTE — ED Notes (Signed)
EDP Bradler at bedside to assess pt prior to CT.

## 2022-07-23 ENCOUNTER — Inpatient Hospital Stay (HOSPITAL_COMMUNITY)
Admit: 2022-07-23 | Discharge: 2022-07-23 | Disposition: A | Discharge status: Home / Self Care | Payer: 59 | Attending: Internal Medicine | Admitting: Internal Medicine

## 2022-07-23 DIAGNOSIS — R4781 Slurred speech: Principal | ICD-10-CM | POA: Diagnosis present

## 2022-07-23 DIAGNOSIS — Z794 Long term (current) use of insulin: Secondary | ICD-10-CM

## 2022-07-23 DIAGNOSIS — I6389 Other cerebral infarction: Secondary | ICD-10-CM | POA: Diagnosis not present

## 2022-07-23 DIAGNOSIS — R531 Weakness: Secondary | ICD-10-CM | POA: Diagnosis not present

## 2022-07-23 DIAGNOSIS — N186 End stage renal disease: Secondary | ICD-10-CM

## 2022-07-23 DIAGNOSIS — Z7901 Long term (current) use of anticoagulants: Secondary | ICD-10-CM

## 2022-07-23 DIAGNOSIS — Z515 Encounter for palliative care: Secondary | ICD-10-CM

## 2022-07-23 DIAGNOSIS — D6859 Other primary thrombophilia: Secondary | ICD-10-CM | POA: Diagnosis not present

## 2022-07-23 DIAGNOSIS — E119 Type 2 diabetes mellitus without complications: Secondary | ICD-10-CM

## 2022-07-23 DIAGNOSIS — Z992 Dependence on renal dialysis: Secondary | ICD-10-CM

## 2022-07-23 DIAGNOSIS — I639 Cerebral infarction, unspecified: Secondary | ICD-10-CM | POA: Diagnosis not present

## 2022-07-23 LAB — ECHOCARDIOGRAM COMPLETE
AR max vel: 2.28 cm2
AV Area VTI: 2.35 cm2
AV Area mean vel: 2.22 cm2
AV Mean grad: 5 mmHg
AV Peak grad: 9.4 mmHg
Ao pk vel: 1.53 m/s
Area-P 1/2: 3.23 cm2
Height: 64 in
MV VTI: 3 cm2
S' Lateral: 2.3 cm
Weight: 4758.41 oz

## 2022-07-23 LAB — LIPID PANEL
Cholesterol: 84 mg/dL (ref 0–200)
HDL: 39 mg/dL — ABNORMAL LOW (ref >40)
LDL Cholesterol: 33 mg/dL (ref 0–99)
Total CHOL/HDL Ratio: 2.2 RATIO
Triglycerides: 62 mg/dL (ref <150)
VLDL: 12 mg/dL (ref 0–40)

## 2022-07-23 LAB — GLUCOSE, CAPILLARY
Glucose-Capillary: 161 mg/dL — ABNORMAL HIGH (ref 70–99)
Glucose-Capillary: 169 mg/dL — ABNORMAL HIGH (ref 70–99)
Glucose-Capillary: 125 mg/dL — ABNORMAL HIGH (ref 70–99)
Glucose-Capillary: 158 mg/dL — ABNORMAL HIGH (ref 70–99)

## 2022-07-23 LAB — HEPATITIS B SURFACE ANTIGEN: Hepatitis B Surface Ag: NONREACTIVE

## 2022-07-23 MED ORDER — HEPARIN SODIUM (PORCINE) 1000 UNIT/ML DIALYSIS: 1000.0000 [IU] | INTRAMUSCULAR | Status: DC | PRN

## 2022-07-23 MED ORDER — PENTAFLUOROPROP-TETRAFLUOROETH EX AERO
INHALATION_SPRAY | CUTANEOUS | Status: AC
  Administered 2022-07-23: 1 via TOPICAL
  Filled 2022-07-23: qty 30

## 2022-07-23 MED ORDER — LIDOCAINE HCL (PF) 1 % IJ SOLN: 5.0000 mL | INTRAMUSCULAR | Status: DC | PRN

## 2022-07-23 MED ORDER — PENTAFLUOROPROP-TETRAFLUOROETH EX AERO
1.0000 | INHALATION_SPRAY | CUTANEOUS | Status: DC | PRN
  Filled 2022-07-23: qty 30

## 2022-07-23 MED ORDER — CHLORHEXIDINE GLUCONATE CLOTH 2 % EX PADS
6.0000 | MEDICATED_PAD | Freq: Every day | CUTANEOUS | Status: DC
  Administered 2022-07-23 – 2022-07-27 (×5): 6 via TOPICAL

## 2022-07-23 MED ORDER — ASPIRIN 325 MG PO TBEC
325.0000 mg | DELAYED_RELEASE_TABLET | Freq: Every day | ORAL | Status: DC
  Administered 2022-07-23 – 2022-07-26 (×4): 325 mg via ORAL
  Filled 2022-07-23 (×4): qty 1

## 2022-07-23 MED ORDER — ANTICOAGULANT SODIUM CITRATE 4% (200MG/5ML) IV SOLN: 5.0000 mL | Status: DC | PRN

## 2022-07-23 MED ORDER — ALTEPLASE 2 MG IJ SOLR: 2.0000 mg | Freq: Once | INTRAMUSCULAR | Status: DC | PRN

## 2022-07-23 MED ORDER — HEPARIN SODIUM (PORCINE) 1000 UNIT/ML IJ SOLN
INTRAMUSCULAR | Status: AC
  Filled 2022-07-23: qty 10

## 2022-07-23 MED ORDER — LIDOCAINE-PRILOCAINE 2.5-2.5 % EX CREA: 1.0000 | TOPICAL_CREAM | CUTANEOUS | Status: DC | PRN

## 2022-07-23 MED ORDER — HEPARIN SODIUM (PORCINE) 5000 UNIT/ML IJ SOLN
5000.0000 [IU] | Freq: Three times a day (TID) | INTRAMUSCULAR | Status: DC
  Administered 2022-07-23 – 2022-07-26 (×8): 5000 [IU] via SUBCUTANEOUS
  Filled 2022-07-23 (×8): qty 1

## 2022-07-23 NOTE — Progress Notes (Signed)
Pt has restricted Lt arm due to dialysis graft Upper Lt arm. Ultrasound to Lower Rt arm & attempted IV access. Unable to provide access. Veins are short with many bifurcations and very small & or deep. MD and RN made aware.

## 2022-07-23 NOTE — Progress Notes (Signed)
1740: Multiple high VP alarms, Flushed venous lines but still with high VP alarms. Dr. Juleen China notified and agreed to do 1:1. Will monitor closely.

## 2022-07-23 NOTE — Assessment & Plan Note (Signed)
Continue coreg, torsemide

## 2022-07-23 NOTE — Progress Notes (Signed)
To dialysis

## 2022-07-23 NOTE — Progress Notes (Signed)
  Progress Note   Patient: Jody Nelson [id number] DOB: [birth year] DOA: 07/22/2022     1 DOS: the patient was seen and examined on 07/23/2022   Brief hospital course: 72 y.o. female with medical history significant for hypercoagulable state with multiple cryptogenic CVAs,on Eliquis, ESRD on HD MWF, anemia of CKD, DMII , obesity with BMI over 50, OSA (not on CPAP), hypertension, HFpEF, biopsy-proven sarcoidosis, history of right tibial plateau fracture 2019 with hardware and chronic wound infection, chronic bilateral lymphedema hospitalized on month ago from 2/3 to 2/11 with cellulitis and abscess of the right leg s/p I&D and on wound VAC and discharged to rehab who presented to the ED as a code stroke after she was noted to be slurring her speech while at the dinner table.  She also had a new left arm weakness and had difficulty using her left arm to feed herself.   3/15: Neuro, Nephro, Palliative care c/s. HD today. MRI showed small acute infarct  Assessment and Plan: * Acute CVA (cerebrovascular accident) ([place]) MRI brain confirmed small acute CVA Neuro c/s Echo - wnl MRI head  Plan for restarting DOAC in 3 days per neuro as long as she doesn't worsen Carotid US - PT/OT/ST eval - monitor on tele - continue lipitor   Chronic anticoagulation Restart DOAC in 3 days if no further clinical worsening per neuro  ESRD on hemodialysis ([place]) HD per nephro  Slurred speech Due to acute CVA  Morbid obesity with BMI of 50.0-59.9, adult ([place]) Complicates overall prognosis  Essential hypertension Continue coreg, torsemide  Diabetes mellitus with insulin therapy ([place]) SSI for now  (HFpEF) heart failure with preserved ejection fraction ([place]) Well compensated at this time. Volume mgmt with HD        Subjective: feeling better now.  Physical Exam: Vitals:   07/23/22 1829 07/23/22 1837 07/23/22 1847 07/23/22 1943  BP: (!) 132/49 (!) 134/49  109/70  Pulse:  74  81  Resp:  13  16   Temp: 98.2 F (36.8 C)   98.2 F (36.8 C)  TempSrc: Oral     SpO2:  100%  100%  Weight:   134.5 kg   Height:       72 y morbidly obese f lying in the bed not in acute distress.  Cardiovascular:     Rate and Rhythm: Normal rate and regular rhythm.     Heart sounds: Normal heart sounds.  Pulmonary:     Effort: Pulmonary effort is normal.     Breath sounds: Normal breath sounds.  Abdominal:     Palpations: Abdomen is soft. benign Skin:    Comments: Multiple wounds on right leg, see pic below  Neurological:     Mental Status: She is alert.     Cranial Nerves: Dysarthria and facial asymmetry present.     Motor: Pronator drift present.     Comments: Right facial droop       Data Reviewed:  MRI brain showed acute stroke  Family Communication: none at bedside  Disposition: Status is: Inpatient Remains inpatient appropriate because: work up and mgmt of acute stroke  Planned Discharge Destination: Skilled nursing facility   DVT prophylaxis - heparin SQ Time spent: 35 minutes  Author: Max Sane, MD 07/23/2022 8:07 PM  For on call review [email or web address].

## 2022-07-23 NOTE — Progress Notes (Signed)
[facility]  ROUNDING NOTE   Subjective:   Jody Nelson is a 72 y.o. female with past medical history of obesity, diabetes, OSA, hypertension, CHF, anemia, and end stage renal disease on hemodialysis. Patient presents to the emergency dept from her SNF due to stroke symptoms. Patient was admitted for Slurred speech [R47.81] Left-sided weakness [R53.1] Acute CVA (cerebrovascular accident) [place]) [I63.9]   Patient is known to our practice and receives outpatient dialysis treatments at [facility] on a MWF schedule, supervised by Dr Candiss Norse. Her last treatment was completed on Wednesday. Staff noticed at change in behavior after dinner. She was noted to have slurred speech. Also reported to left arm weakness. Patient seen sitting up in bed, breakfast tray at bedside, set up. Staff encouraging patient feed self. Delayed speech noted. Weakness persist in left hand.   MRI brain shows small acute infarct. Labs on ED arrival unremarkable for renal patient.   We have been consulted to manage dialysis needs during this admission.   Objective:  Vital signs in last 24 hours:  Temp:  [97.9 F (36.6 C)-98.6 F (37 C)] 98 F (36.7 C) (03/15 1445) Pulse Rate:  [77-88] 80 (03/15 1445) Resp:  [10-28] 17 (03/15 1445) BP: (107-144)/(35-59) 112/42 (03/15 1445) SpO2:  [100 %] 100 % (03/15 1445) Weight:  [134.9 kg-136.1 kg] 134.9 kg (03/15 1444)  Weight change:  Filed Weights   07/22/22 2026 07/23/22 0243 07/23/22 1444  Weight: 136.1 kg 134.9 kg 134.9 kg    Intake/Output: No intake/output data recorded.   Intake/Output this shift:  Total I/O In: 120 [P.O.:120] Out: -   Physical Exam: General: NAD  Head: Normocephalic, atraumatic. Moist oral mucosal membranes  Eyes: Anicteric  Lungs:  Clear to auscultation, normal effort.   Heart: Regular rate and rhythm  Abdomen:  Soft, nontender, obese  Extremities:  trace peripheral edema.   Neurologic: Alert, Left arm weakness  Skin:  No lesions  Access: Lt AVG, Rt permcath    Basic Metabolic Panel: Recent Labs  Lab 07/22/22 2030  NA 138  K 4.1  CL 101  CO2 28  GLUCOSE 178*  BUN 18  CREATININE 4.75*  CALCIUM 8.6*    Liver Function Tests: Recent Labs  Lab 07/22/22 2030  AST 25  ALT 16  ALKPHOS 69  BILITOT 0.6  PROT 7.0  ALBUMIN 2.6*   No results for input(s): "LIPASE", "AMYLASE" in the last 168 hours. No results for input(s): "AMMONIA" in the last 168 hours.  CBC: Recent Labs  Lab 07/22/22 2030  WBC 7.7  NEUTROABS 4.3  HGB 9.2*  HCT 31.5*  MCV 98.4  PLT 186    Cardiac Enzymes: No results for input(s): "CKTOTAL", "CKMB", "CKMBINDEX", "TROPONINI" in the last 168 hours.  BNP: Invalid input(s): "POCBNP"  CBG: Recent Labs  Lab 07/22/22 1949 07/23/22 0211 07/23/22 0819 07/23/22 1156  GLUCAP 162* 161* 169* 125*    Microbiology: Results for orders placed or performed during the hospital encounter of 06/12/22  MRSA Next Gen by PCR, Nasal     Status: None   Collection Time: 06/13/22 12:45 AM   Specimen: Nasal Mucosa; Nasal Swab  Result Value Ref Range Status   MRSA by PCR Next Gen NOT DETECTED NOT DETECTED Final    Comment: (NOTE) The GeneXpert MRSA Assay (FDA approved for NASAL specimens only), is one component of a comprehensive MRSA colonization surveillance program. It is not intended to diagnose MRSA infection nor to guide or monitor treatment for MRSA infections. Test  performance is not FDA approved in patients less than 49 years old. Performed at [facility], [address] [place], [place] [address]   Culture, blood (Routine X 2) w Reflex to ID Panel     Status: None   Collection Time: 06/13/22  9:34 AM   Specimen: BLOOD  Result Value Ref Range Status   Specimen Description BLOOD BLOOD LEFT HAND  Final   Special Requests   Final    BOTTLES DRAWN AEROBIC AND ANAEROBIC Blood Culture adequate volume   Culture   Final    NO GROWTH 5 DAYS Performed at  [facility], [address], [place], [place] [address]    Report Status 06/18/2022 FINAL  Final  Aerobic Culture w Gram Stain (superficial specimen)     Status: None   Collection Time: 06/13/22  9:35 AM   Specimen: Abscess  Result Value Ref Range Status   Specimen Description   Final    ABSCESS S T Performed at [facility], [address], [place], [place] [address]    Special Requests   Final    NONE Performed at [facility], [address] [place], [place] [address]    Gram Stain   Final    FEW GRAM POSITIVE COCCI IN SINGLES IN PAIRS RARE GRAM VARIABLE ROD NO WBC SEEN Performed at [facility], [place] [address] [place], [place] [address]    Culture   Final    FEW MORGANELLA MORGANII FEW ESCHERICHIA COLI FEW STAPHYLOCOCCUS AUREUS    Report Status 06/17/2022 FINAL  Final   Organism ID, Bacteria MORGANELLA MORGANII  Final   Organism ID, Bacteria ESCHERICHIA COLI  Final   Organism ID, Bacteria STAPHYLOCOCCUS AUREUS  Final      Susceptibility   Escherichia coli - MIC*    AMPICILLIN >=32 RESISTANT Resistant     CEFAZOLIN <=4 SENSITIVE Sensitive     CEFEPIME <=0.12 SENSITIVE Sensitive     CEFTAZIDIME <=1 SENSITIVE Sensitive     CEFTRIAXONE <=0.25 SENSITIVE Sensitive     CIPROFLOXACIN >=4 RESISTANT Resistant     GENTAMICIN <=1 SENSITIVE Sensitive     IMIPENEM <=0.25 SENSITIVE Sensitive     TRIMETH/SULFA >=320 RESISTANT Resistant     AMPICILLIN/SULBACTAM 16 INTERMEDIATE Intermediate     PIP/TAZO <=4 SENSITIVE Sensitive     * FEW ESCHERICHIA COLI   Morganella morganii - MIC*    AMPICILLIN >=32 RESISTANT Resistant     CEFAZOLIN RESISTANT Resistant     CEFTAZIDIME <=1 SENSITIVE Sensitive     CIPROFLOXACIN >=4 RESISTANT Resistant     GENTAMICIN <=1 SENSITIVE Sensitive     IMIPENEM 0.5 SENSITIVE Sensitive     TRIMETH/SULFA >=320 RESISTANT Resistant     AMPICILLIN/SULBACTAM 16 INTERMEDIATE Intermediate     PIP/TAZO <=4 SENSITIVE  Sensitive     * FEW MORGANELLA MORGANII   Staphylococcus aureus - MIC*    CIPROFLOXACIN >=8 RESISTANT Resistant     ERYTHROMYCIN >=8 RESISTANT Resistant     GENTAMICIN <=0.5 SENSITIVE Sensitive     OXACILLIN 2 SENSITIVE Sensitive     TETRACYCLINE >=16 RESISTANT Resistant     VANCOMYCIN 1 SENSITIVE Sensitive     TRIMETH/SULFA >=320 RESISTANT Resistant     CLINDAMYCIN <=0.25 SENSITIVE Sensitive     RIFAMPIN <=0.5 SENSITIVE Sensitive     Inducible Clindamycin NEGATIVE Sensitive     * FEW STAPHYLOCOCCUS AUREUS  Culture, blood (Routine X 2) w Reflex to ID Panel     Status:  None   Collection Time: 06/13/22  2:23 PM   Specimen: BLOOD  Result Value Ref Range Status   Specimen Description BLOOD BLOOD RIGHT FOREARM  Final   Special Requests   Final    BOTTLES DRAWN AEROBIC AND ANAEROBIC Blood Culture adequate volume   Culture   Final    NO GROWTH 5 DAYS Performed at [facility], [address] [place], [place] [address]    Report Status 06/18/2022 FINAL  Final  Aerobic/Anaerobic Culture w Gram Stain (surgical/deep wound)     Status: None   Collection Time: 06/15/22  2:37 PM   Specimen: Leg, Right; Wound  Result Value Ref Range Status   Specimen Description   Final    WOUND Performed at [facility], [address] [place], [place] [address]    Special Requests   Final    RIGHT LEG Performed at [facility], [address] [place], [place] [address]    Gram Stain   Final    FEW WBC PRESENT,BOTH PMN AND MONONUCLEAR RARE GRAM POSITIVE COCCI IN PAIRS    Culture   Final    RARE METHICILLIN RESISTANT STAPHYLOCOCCUS AUREUS RARE ESCHERICHIA COLI RARE GROUP B STREP(S.AGALACTIAE)ISOLATED TESTING AGAINST S. AGALACTIAE NOT ROUTINELY PERFORMED DUE TO PREDICTABILITY OF AMP/PEN/VAN SUSCEPTIBILITY. FEW MORGANELLA MORGANII NO ANAEROBES ISOLATED Performed at [facility], [place] [address], [place], [place] [address]    Report Status 06/21/2022 FINAL  Final    Organism ID, Bacteria ESCHERICHIA COLI  Final   Organism ID, Bacteria MORGANELLA MORGANII  Final   Organism ID, Bacteria METHICILLIN RESISTANT STAPHYLOCOCCUS AUREUS  Final      Susceptibility   Escherichia coli - MIC*    AMPICILLIN >=32 RESISTANT Resistant     CEFAZOLIN <=4 SENSITIVE Sensitive     CEFEPIME <=0.12 SENSITIVE Sensitive     CEFTAZIDIME <=1 SENSITIVE Sensitive     CEFTRIAXONE <=0.25 SENSITIVE Sensitive     CIPROFLOXACIN >=4 RESISTANT Resistant     GENTAMICIN <=1 SENSITIVE Sensitive     IMIPENEM <=0.25 SENSITIVE Sensitive     TRIMETH/SULFA >=320 RESISTANT Resistant     AMPICILLIN/SULBACTAM 16 INTERMEDIATE Intermediate     PIP/TAZO <=4 SENSITIVE Sensitive     * RARE ESCHERICHIA COLI   Morganella morganii - MIC*    AMPICILLIN >=32 RESISTANT Resistant     CEFAZOLIN >=64 RESISTANT Resistant     CEFTAZIDIME <=1 SENSITIVE Sensitive     CIPROFLOXACIN >=4 RESISTANT Resistant     GENTAMICIN <=1 SENSITIVE Sensitive     IMIPENEM 2 SENSITIVE Sensitive     TRIMETH/SULFA >=320 RESISTANT Resistant     AMPICILLIN/SULBACTAM >=32 RESISTANT Resistant     PIP/TAZO <=4 SENSITIVE Sensitive     * FEW MORGANELLA MORGANII   Methicillin resistant staphylococcus aureus - MIC*    CIPROFLOXACIN >=8 RESISTANT Resistant     ERYTHROMYCIN >=8 RESISTANT Resistant     GENTAMICIN <=0.5 SENSITIVE Sensitive     OXACILLIN >=4 RESISTANT Resistant     TETRACYCLINE >=16 RESISTANT Resistant     VANCOMYCIN 1 SENSITIVE Sensitive     TRIMETH/SULFA >=320 RESISTANT Resistant     CLINDAMYCIN <=0.25 SENSITIVE Sensitive     RIFAMPIN <=0.5 SENSITIVE Sensitive     Inducible Clindamycin NEGATIVE Sensitive     * RARE METHICILLIN RESISTANT STAPHYLOCOCCUS AUREUS    Coagulation Studies: Recent Labs    07/22/22 2030  LABPROT 14.9  INR 1.2    Urinalysis: No results for input(s): "COLORURINE", "LABSPEC", "PHURINE", "GLUCOSEU", "HGBUR", "  BILIRUBINUR", "KETONESUR", "PROTEINUR", "UROBILINOGEN", "NITRITE",  "LEUKOCYTESUR" in the last 72 hours.  Invalid input(s): "APPERANCEUR"    Imaging: MR BRAIN WO CONTRAST  Result Date: 07/22/2022 CLINICAL DATA:  Acute neurologic deficit EXAM: MRI HEAD WITHOUT CONTRAST TECHNIQUE: Multiplanar, multiecho pulse sequences of the brain and surrounding structures were obtained without intravenous contrast. COMPARISON:  Brain MRI 03/02/2022 FINDINGS: Brain: Small acute infarct of the right centrum semiovale. No acute hemorrhage or mass effect. Numerous chronic microhemorrhages in a mixed central and peripheral distribution. 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 Normal white matter signal, parenchymal volume and CSF spaces. The midline structures are normal. Vascular: Normal flow voids. Skull and upper cervical spine: Normal marrow signal. Sinuses/Orbits: Negative. Other: None. IMPRESSION: 1. Small acute infarct of the right centrum semiovale. 2. Numerous chronic microhemorrhages in a mixed central and peripheral distribution, which could be due to chronic hypertensive or cerebral amyloid angiopathy. 3. 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 Electronically Signed   By: Ulyses Jarred M.D.   On: 07/22/2022 23:38   CT HEAD CODE STROKE WO CONTRAST  Result Date: 07/22/2022 CLINICAL DATA:  Code stroke.  Left-sided weakness EXAM: CT HEAD WITHOUT CONTRAST TECHNIQUE: Contiguous axial images were obtained from the base of the skull through the vertex without intravenous contrast. RADIATION DOSE REDUCTION: This exam was performed according to the departmental dose-optimization program which includes automated exposure control, adjustment of the mA and/or kV according to patient size and/or use of iterative reconstruction technique. COMPARISON:  05/01/2022 FINDINGS: Brain: There is no mass, hemorrhage or extra-axial collection. The size and configuration of the ventricles and extra-axial CSF spaces are normal. Unchanged appearance of chronic infarcts of the left frontal  and parietal lobes. Vascular: No abnormal hyperdensity of the major intracranial arteries or dural venous sinuses. No intracranial atherosclerosis. Skull: The visualized skull base, calvarium and extracranial soft tissues are normal. Sinuses/Orbits: No fluid levels or advanced mucosal thickening of the visualized paranasal sinuses. No mastoid or middle ear effusion. The orbits are normal. ASPECTS [facility] Stroke Program Early CT Score) - Ganglionic level infarction (caudate, lentiform nuclei, internal capsule, insula, M1-M3 cortex): 7 - Supraganglionic infarction (M4-M6 cortex): 3 Total score (0-10 with 10 being normal): 10 IMPRESSION: 1. No acute intracranial abnormality. 2. 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 3. ASPECTS is 10. These results were called by telephone at the time of interpretation on 07/22/2022 at 8:01 pm to provider [facility] , who verbally acknowledged these results. Electronically Signed   By: Ulyses Jarred M.D.   On: 07/22/2022 20:01     Medications:    anticoagulant sodium citrate      aspirin EC  325 mg Oral Daily   atorvastatin  80 mg Oral Daily   calcitRIOL  0.25 mcg Oral Daily   calcium acetate  667 mg Oral TID WC   carvedilol  25 mg Oral BID WC   Chlorhexidine Gluconate Cloth  6 each Topical Q0600   docusate sodium  100 mg Oral BID   gabapentin  200 mg Oral BID   heparin  5,000 Units Subcutaneous Q8H   insulin aspart  0-5 Units Subcutaneous QHS   insulin aspart  0-6 Units Subcutaneous TID WC   insulin glargine-yfgn  10 Units Subcutaneous QHS   latanoprost  1 drop Both Eyes QHS   multivitamin  1 tablet Oral BID   pentafluoroprop-tetrafluoroeth       polyethylene glycol  17 g Oral BID   torsemide  10 mg Oral  Daily   acetaminophen **OR** acetaminophen (TYLENOL) oral liquid 160 mg/5 mL **OR** acetaminophen, alteplase, anticoagulant sodium citrate, heparin, HYDROcodone-acetaminophen, ipratropium-albuterol, lidocaine (PF),  lidocaine-prilocaine, pentafluoroprop-tetrafluoroeth, pentafluoroprop-tetrafluoroeth, traZODone  Assessment/ Plan:  Jody Nelson is a 72 y.o.  female with past medical history of obesity, diabetes, OSA, hypertension, CHF, anemia, and end stage renal disease on hemodialysis. Patient presents to the emergency dept from her SNF due to stroke symptoms. Patient was admitted for Slurred speech [R47.81] Left-sided weakness [R53.1] Acute CVA (cerebrovascular accident) ([place]) [I63.9]  [facility]/MWF/LT AVG/Rt permcth/134kg  End stage renal disease on hemodialysis. Last treatment received on Wednesday.. Will schedule treatment today, UF goal 1.5L as tolerated. Next treatment scheduled for Monday.   2.Anemia of chronic kidney disease Lab Results  Component Value Date   HGB 9.2 (L) 07/22/2022    Hgb within desired range, will monitor for now.   3. Secondary Hyperparathyroidism: with outpatient labs: PTH 463, phosphorus 6.8, calcium 8.4 on 05/17/22    Lab Results  Component Value Date   CALCIUM 8.6 (L) 07/22/2022   CAION 1.13 (L) 04/16/2022   PHOS 4.5 06/21/2022    Calcium and phosphorus within desired range. Continue calcium acetate and calcitriol with meals.   4. Diabetes mellitus type II with chronic kidney disease/renal manifestations: insulin dependent. Home regimen includes Lantus.    LOS: 1 Jody Nelson 3/15/20243:05 PM

## 2022-07-23 NOTE — Hospital Course (Addendum)
72 y.o. female with medical history significant for hypercoagulable state with multiple cryptogenic CVAs,on Eliquis, ESRD on HD MWF, anemia of CKD, DMII , obesity with BMI over 50, OSA (not on CPAP), hypertension, HFpEF, biopsy-proven sarcoidosis, history of right tibial plateau fracture 2019 with hardware and chronic wound infection, chronic bilateral lymphedema hospitalized on month ago from 2/3 to 2/11 with cellulitis and abscess of the right leg s/p I&D and on wound VAC and discharged to rehab who presented to the ED as a code stroke after she was noted to be slurring her speech while at the dinner table.  She also had a new left arm weakness and had difficulty using her left arm to feed herself.   3/15: Neuro, Nephro, Palliative care c/s. HD today. MRI showed small acute infarct 3/16: Incomplete dialysis yesterday due to increased venous pressure so will get another session today to complete 3/17: Carotid Doppler pending 3/18: Restarted Eliquis, HD today

## 2022-07-23 NOTE — Progress Notes (Signed)
SLP Cancellation Note  Patient Details Name: Jody Nelson MRN: [id number] DOB: 1951-02-04   Cancelled treatment:       Reason Eval/Treat Not Completed: Patient at procedure or test/unavailable  Pt having ECHO. Will re-attempt as able.   [name] B. Rutherford Nail, M.S., CCC-SLP, [place] Pathologist Certified Brain Injury [place]  [facility] [phone] Ascom [phone] Fax [phone]  [name] 07/23/2022, 1:59 PM

## 2022-07-23 NOTE — Progress Notes (Signed)
MD informed about the Multiple High VP alarm. Per md ok to rinse back and bring patient back again tomorrow to be run.

## 2022-07-23 NOTE — Progress Notes (Signed)
Spoke with night shift RN about access for this patient. Shift change report noted nephrology not comfortable with midline placement. Patient has no fluid or medications ordered at this time. Made RN aware that the I.V. Team is available to access H.D. catheter if emergently needed.

## 2022-07-23 NOTE — Progress Notes (Signed)
PT Cancellation Note  Patient Details Name: Jody Nelson MRN: [id number] DOB: 1951/03/23   Cancelled Treatment:    Reason Eval/Treat Not Completed: Patient not medically ready (Chart reviewed, noted lack of IV access despite attempts. Discussed deferring PT assessment until this need has been achieved. MD agreeable. WIll evaluate at later date/time.)  10:19 AM, 07/23/22 Etta Grandchild, PT, DPT Physical Therapist - [facility]  [phone] ([place])    Zilla Shartzer C 07/23/2022, 10:19 AM

## 2022-07-23 NOTE — Assessment & Plan Note (Signed)
Complicates overall prognosis. 

## 2022-07-23 NOTE — Progress Notes (Signed)
Received patient in bed to unit.  Alert and oriented.  Informed consent signed and in chart.   TX duration:1.5 hours   Transported back to the room  Alert, without acute distress.  Hand-off given to patient's nurse.   Access used: avf,switch to hemodialysis cath mid tx Access issues: multiple VP high alarms, flushed Did 1:1 per previous RN.  tried reversing the line. Still high VP alarm. md notified and agreed to rinse back pt and be brought back again tomorrow to be run.  Total UF removed: 100 Medication(s) given: heparin flush 2.2 cc per port.  Post HD VS: see table below Post HD weight: 134.5kg using bed scale.     07/23/22 1837  Vitals  BP (!) 134/49  MAP (mmHg) 73  BP Location Right Arm  BP Method Automatic  Patient Position (if appropriate) Lying  Pulse Rate 74  Pulse Rate Source Monitor  ECG Heart Rate 81  Resp 13  Oxygen Therapy  SpO2 100 %  O2 Device Room Air  Patient Activity (if Appropriate) In bed  Pulse Oximetry Type Continuous  During Treatment Monitoring  HD Safety Checks Performed Yes  Intra-Hemodialysis Comments See progress note  Post Treatment  Dialyzer Clearance Heavily streaked  Duration of HD Treatment -hour(s) 1.5 hour(s)  Hemodialysis Intake (mL) 0 mL  Liters Processed 25.6  Fluid Removed (mL) 100 mL  Tolerated HD Treatment No (Comment)  Post-Hemodialysis Comments see notes  AVG/AVF Arterial Site Held (minutes) 5 minutes  AVG/AVF Venous Site Held (minutes) 5 minutes  Fistula / Graft Left Upper arm Arteriovenous fistula  No placement date or time found.   Placed prior to admission: Yes  Orientation: Left  Access Location: Upper arm  Access Type: Arteriovenous fistula  Site Condition No complications  Fistula / Graft Assessment Present;Thrill;Bruit  Status Deaccessed;Flushed  Needle Size [facility]

## 2022-07-23 NOTE — Plan of Care (Signed)
  Problem: Coping: Goal: Ability to adjust to condition or change in health will improve Outcome: Completed/Met   Problem: Fluid Volume: Goal: Ability to maintain a balanced intake and output will improve Outcome: Completed/Met   Problem: Health Behavior/Discharge Planning: Goal: Ability to identify and utilize available resources and services will improve Outcome: Completed/Met Goal: Ability to manage health-related needs will improve Outcome: Progressing

## 2022-07-23 NOTE — Assessment & Plan Note (Addendum)
MRI brain confirmed small acute CVA Neuro c/s Echo - wnl MRI head  Plan for restarting DOAC in 3 days per neuro as long as she doesn't worsen Carotid US - PT/OT/ST eval - monitor on tele - continue lipitor

## 2022-07-23 NOTE — Assessment & Plan Note (Signed)
Well compensated at this time. Volume mgmt with HD

## 2022-07-23 NOTE — TOC Initial Note (Signed)
Transition of Care [facility]) - Initial/Assessment Note    Patient Details  Name: Jody Nelson MRN: [id number] Date of Birth: December 07, 1950  Transition of Care [facility]) CM/SW Contact:    Candie Chroman, LCSW Phone Number: 07/23/2022, 12:12 PM  Clinical Narrative:  Per chart review, patient admitted from [facility]. CSW spoke with Caryl Pina at the facility who confirmed she is long-term care. Per OT note, she was receiving rehab prior to admission. If PT recommends as well, will start auth closer to discharge.                Expected Discharge Plan: [place] Barriers to Discharge: Continued Medical Work up, Ship broker   Patient Goals and CMS Choice            Expected Discharge Plan and Services       Living arrangements for the past 2 months: [place]                                      Prior Living Arrangements/Services Living arrangements for the past 2 months: [place] Lives with:: Facility Resident Patient language and need for interpreter reviewed:: Yes        Need for Family Participation in Patient Care: Yes (Comment) Care giver support system in place?: Yes (comment)   Criminal Activity/Legal Involvement Pertinent to Current Situation/Hospitalization: No - Comment as needed  Activities of Daily Living      Permission Sought/Granted Permission sought to share information with : Facility Arts administrator granted to share info w AGENCY: [facility]        Emotional Assessment       Orientation: : Oriented to Self, Oriented to Place, Oriented to  Time, Oriented to Situation Alcohol / Substance Use: Not Applicable Psych Involvement: No (comment)  Admission diagnosis:  Slurred speech [R47.81] Left-sided weakness [R53.1] Acute CVA (cerebrovascular accident) [facility]) [I63.9] Patient Active Problem List   Diagnosis Date Noted   Acute CVA (cerebrovascular  accident) ([place]) 07/22/2022   Multiple open wounds of lower leg 06/17/2022   Wound infection 06/14/2022   Decubitus ulcer of heel, bilateral 06/13/2022   Hypercoagulable state (positive anticardiolipin, elevated homocysteine, factor V Leiden deficiency) 06/13/2022   Osteomyelitis of right leg ([place]) 06/12/2022   Non-healing wound of right lower extremity 06/12/2022   Cellulitis and abscess of right leg 06/12/2022   Chronic anticoagulation 06/12/2022   ESRD on hemodialysis ([place]) 12/27/2021   (HFpEF) heart failure with preserved ejection fraction ([place]) 01/31/2021   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[place]) 10/02/2020   Hereditary and idiopathic neuropathy, unspecified 10/02/2020   History of falling 10/02/2020   Insomnia, unspecified 10/02/2020   Muscle weakness (generalized) 10/02/2020   Resistance to multiple antibiotics 10/02/2020   Unspecified abnormalities of gait and mobility 10/02/2020   Unspecified lack of coordination 10/02/2020   Weakness 10/02/2020   Anemia of chronic renal failure 10/02/2020   Multiple drug resistant organism (MDRO) culture positive 09/25/2020   H/O: CVA (cerebrovascular accident) 06/11/2020   Uncontrolled type 2 diabetes mellitus with hyperglycemia, with long-term current use of insulin ([place]) 06/11/2020   Urinary tract infection 06/11/2020   Heel ulcer, left, limited to breakdown of skin ([place]) 01/04/2020   Leg ulcer, left, limited to breakdown of skin ([place]) 01/04/2020   Lymphedema, not elsewhere classified 01/04/2020   Chest pain, atypical 09/10/2019  Anemia, unspecified 09/19/2018   Cellulitis of left lower extremity 09/19/2018   Sepsis ([place]) 09/19/2018   Anticardiolipin antibody positive 08/16/2018   Elevated homocysteine 08/16/2018   Heterozygous factor V Leiden mutation ([place]) 08/16/2018   History of obstructive sleep apnea 07/18/2018   Ischemic stroke ([place]) 07/18/2018   Acute kidney failure, unspecified ([place]) 07/02/2018   Acute kidney injury  superimposed on CKD ([place]) 06/29/2018   Encounter for other specified surgical aftercare 05/11/2018   Proteus (mirabilis) ([place]) as the cause of diseases classified elsewhere 05/11/2018   CKD (chronic kidney disease) stage 3, GFR 30-59 ml/min (HCC) 03/22/2018   Closed fracture of proximal end of left humerus 03/22/2018   Closed fracture of right tibial plateau with routine healing 03/22/2018   Hyperlipidemia with target LDL less than 70 07/14/2017   Morbid (severe) obesity due to excess calories ([place]) 10/20/2016   Unspecified sequelae of cerebral infarction 10/20/2016   Unspecified diastolic (congestive) heart failure ([place]) 10/20/2016   Type 2 diabetes mellitus without complications ([place]) [id number]   Primary osteoarthritis of both knees 05/21/2016   History of influenza 05/04/2016   Dyspnea on effort 03/12/2016   GERD (gastroesophageal reflux disease) 01/27/2016   Calculus of gallbladder without cholecystitis without obstruction 10/21/2015   Pseudophakia of left eye 10/07/2015   Pseudophakia of right eye 08/26/2015   Morbid obesity with BMI of 50.0-59.9, adult ([place]) 07/29/2015   Lymphedema of both lower extremities 07/08/2015   Hallux valgus, acquired 07/08/2015   Onychomycosis due to dermatophyte 07/08/2015   At high risk for falls 12/10/2014   Essential hypertension 12/10/2014   Hypertension associated with type 2 diabetes mellitus ([place]) 12/10/2014   Vitamin D deficiency 11/21/2014   Diabetes mellitus with insulin therapy ([place]) 10/13/2012   Sarcoidosis 01/31/2012   PCP:  Patient, No Pcp Per Pharmacy:   [facility], [address] [place] [address] Phone: [phone] Fax: [phone]     Social Determinants of Health (SDOH) Social History: SDOH Screenings   Food Insecurity: No Food Insecurity (07/23/2022)  Housing: Low Risk  (07/23/2022)  Transportation Needs: No Transportation Needs (07/23/2022)   Utilities: Not At Risk (07/23/2022)  Tobacco Use: Medium Risk (07/22/2022)   SDOH Interventions: Housing Interventions: Patient Refused   Readmission Risk Interventions     No data to display

## 2022-07-23 NOTE — Assessment & Plan Note (Signed)
Restart DOAC in 3 days if no further clinical worsening per neuro

## 2022-07-23 NOTE — Consult Note (Signed)
Consultation Note Date: 07/23/2022 at 1330  Patient Name: Jody Nelson  DOB: 05/16/1950  MRN: [id number]  Age / Sex: 72 y.o., female  PCP: Patient, No Pcp Per Referring Physician: Max Sane, MD  Reason for Consultation: Establishing goals of care  HPI/Patient Profile: 72 y.o. 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  As per code stroke protocol, head CT revealed no acute hemorrhage or acute core infarct.  Teleneurology did not think findings were consistent with LVO and recommended getting MRI while holding anticoagulation until MRI could determine presence of stroke/size.  While in the ER, patient began to regain some of the initial deficits -slurred speech still present but improving and left upper extremity weakness almost back to baseline.  [facility] was consulted to discuss goals of care.  Clinical Assessment and Goals of Care: I have reviewed medical records including EPIC notes, labs and imaging, assessed the patient and then met with patient at bedside to discuss diagnosis prognosis, GOC, EOL wishes, disposition and options.  Her speech is mildly delayed and slurred but easily understood.  She is able to acknowledge my presence and make her wishes known.  She answers all orientation questions  appropriately.  I introduced Palliative Medicine as specialized medical care for people living with serious illness. It focuses on providing relief from the symptoms and stress of a serious illness. The goal is to improve quality of life for both the patient and the family.  We discussed a brief life review of the patient.  Patient is a widow and has 1 child, a daughter.  She has 3 grandchildren.  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  We discussed patient's current acute illness (CVA) and what it means in the larger context of patient's on-going co-morbidities.   We discussed patient's ongoing wounds, poor mobility, multiple strokes, and difficulty completing ADLs without max assist.  Reviewed functional, nutritional, and cognitive status as significant indicators of patient's overall prognosis.   I attempted to elicit values and goals of care important to the patient. Advance directives, concepts specific to code status, artificial feeding and hydration, and rehospitalization were considered and discussed.   Patient shares she created advanced directive years ago but cannot recall where it is or what it said.  Patient states that in the event she is unable to speak for herself that she would want her daughter to make her decisions for her. Her  daughter is her [facility] surrogate Media planner.  Patient states that she would never want her life to be sustained on life support.  We discussed difference between full code status to DNR.  Patient not prepared to make shift and plan of care code status at this time.  However, she is interested to have future discussions involving her daughter to discuss advanced care planning documentation.    Copy of MOST form and Hard choices for loving people booklet introduced.  We discussed the importance of setting boundaries of care if patient is clear on what she wants her wishes to be.  Patient is appreciative of  information and says she is going to think about it and speak with her daughter.  Full code and full scope remain for now.   Discussed with patient the importance of continued conversation with family and the medical providers regarding overall plan of care and treatment options, ensuring decisions are within the context of the patient's values and GOCs.    Questions and concerns were addressed. Patient was encouraged to call with questions or concerns. Patient took a personal phone call during our discussion. We agreed to speak more at a later time. PMT to continue to follow/support.   Primary Decision Maker PATIENT  Physical Exam Vitals reviewed.  Constitutional:      General: She is not in acute distress.    Appearance: She is obese. She is not ill-appearing.  HENT:     Head: Normocephalic.     Mouth/Throat:     Mouth: Mucous membranes are moist.  Eyes:     Pupils: Pupils are equal, round, and reactive to light.  Cardiovascular:     Rate and Rhythm: Normal rate.     Pulses: Normal pulses.  Pulmonary:     Effort: Pulmonary effort is normal.  Abdominal:     Palpations: Abdomen is soft.  Musculoskeletal:     Comments: Generalized weakness  Skin:    General: Skin is warm and dry.     Comments: LE wounds  Neurological:     Mental Status: She is alert and oriented to person, place, and time.  Psychiatric:        Mood and Affect: Mood normal.        Behavior: Behavior normal.        Thought Content: Thought content normal.        Judgment: Judgment normal.     Palliative Assessment/Data: 30%     Thank you for this consult. Palliative medicine will continue to follow and assist holistically.   Time Total: 75 minutes Greater than 50%  of this time was spent counseling and coordinating care related to the above assessment and plan.  Signed by: Jordan Hawks, DNP, FNP-BC Palliative Medicine    Please contact Palliative Medicine Team phone at [phone] for  questions and concerns.  For individual provider: See Shea Evans

## 2022-07-23 NOTE — Progress Notes (Signed)
*  PRELIMINARY RESULTS* Echocardiogram 2D Echocardiogram has been performed.  Jody Nelson 07/23/2022, 1:58 PM

## 2022-07-23 NOTE — Progress Notes (Signed)
Neurology progress note  S: Patient seen in dialysis. States she is feeling better. MRI brain showed small acute infarct R centrum semiovale.  O:  Vitals:   07/23/22 1200 07/23/22 1445  BP:  (!) 112/42  Pulse: 81 80  Resp: 15 17  Temp:  98 F (36.7 C)  SpO2: 100% 100%    Physical Exam Gen: A&Ox3, NAD HEENT: Atraumatic, normocephalic; oropharynx clear, tongue without atrophy or fasciculations. Resp: CTAB, normal work of breathing CV: RRR, extremities appear well-perfused. Abd: soft/NT/ND Extrem: Nml bulk; no cyanosis, clubbing, or edema.  Neuro: *MS: A&O x3. Follows single-step commands.  *Speech: mild dysarthria no aphasia, able to name and repeat. *CN:    I: Deferred   II,III: PERRLA, VFF by confrontation, optic discs not visualized 2/2 pupillary constriction   III,IV,VI: EOMI w/o nystagmus, no ptosis   V: Sensation intact from V1 to V3 to LT   VII: Eyelid closure was full.  L NLF flattening   VIII: Hearing intact to voice   IX,X: Voice normal, palate elevates symmetrically    XI: SCM/trap 5/5 bilat   XII: Tongue protrudes midline, no atrophy or fasciculations  *Motor:   Normal bulk.  No tremor, rigidity or bradykinesia. Drift BUE but not to bed, grip symmetric. BLE drift to bed *Sensory: L sensory deficit. No double-simultaneous extinction.  *Coordination:  No ataxia on FNF bilat, incoordination LUE *Reflexes:  1+ and symmetric throughout without clonus; toes down-going bilat *Gait: deferred  NIHSS 1a Level of Conscious.: 0 1b LOC Questions: 0 1c LOC Commands: 0 2 Best Gaze: 0 3 Visual: 0 4 Facial Palsy: 1 5a Motor Arm - left: 1 5b Motor Arm - Right: 2 6a Motor Leg - Left: 1 6b Motor Leg - Right: 2 7 Limb Ataxia: 0 8 Sensory: 1 9 Best Language: 0 10 Dysarthria: 1 11 Extinct. and Inatten.: 0  TOTAL: 9 (from 6 yesterday, suspect strength exam effort-dependent - patient seen during dialysis)  Stroke workup this admission  MRI brain  1. Small acute  infarct of the right centrum semiovale. 2. Numerous chronic microhemorrhages in a mixed central and peripheral distribution, which could be due to chronic hypertensive or cerebral amyloid angiopathy. 3. 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 Seen by teleneuro overnight. Cerebrovascular imaging and TTE are pending. If no findings to explain infarct on MRA head may consider switching her to a different DOAC if she has been compliant with eliquis (I will confirm with patient). Given  the small size of the infarct which somewhat low risk of hemorrhagic conversion, and the fact that she has known hypercoagulable state, would plan on restarting anticoagulation in 3 days as long as her exam remains stable.  - Permissive HTN x48 hrs from sx onset goal BP <220/110. PRN labetalol or hydralazine if BP above these parameters. Avoid oral antihypertensives. - MRA head - Carotid US - TTE - Hold anticoagulation x3 days from symptom onset.  Bridge with ASA 81mg  daily in the meantime. Consider restarting anticoagulation at day 3 if exam is stable - Will consider switching DOAC based on MRA findings - further guidance will be provided tomorrow - q4 hr neuro checks - STAT head CT for any change in neuro exam - Tele - PT/OT/SLP - Stroke education - Amb referral to neurology upon discharge   Su Monks, MD [facility] [phone]  If [place], please page neurology on call as listed in [place].

## 2022-07-23 NOTE — Assessment & Plan Note (Signed)
SSI for now 

## 2022-07-23 NOTE — Assessment & Plan Note (Signed)
Due to acute CVA

## 2022-07-23 NOTE — Assessment & Plan Note (Signed)
HD per nephro 

## 2022-07-23 NOTE — Evaluation (Signed)
Occupational Therapy Evaluation Patient Details Name: Jody Nelson MRN: [id number] DOB: [birth year] Today's Date: 07/23/2022   History of Present Illness Pt is a 72 year old female admitted with acute CVA right centrum semiovale and numerous chronic microhemorrhages; PMH significant for hypercoagulable state with multiple cryptogenic CVAs,on Eliquis, ESRD on HD MWF, anemia of CKD, DMII , obesity with BMI over 50, OSA (not on CPAP), hypertension, HFpEF, biopsy-proven sarcoidosis, history of right tibial plateau fracture 2019 with hardware and chronic wound infection, chronic bilateral lymphedema hospitalized on month ago from 2/3 to 2/11 with cellulitis and abscess of the right leg s/p I&D and wound VAC   Clinical Impression   Chart reviewed, nurse cleared pt for participation in OT evaluation. Pt is oriented to self, place, not oriented to date or situation. Increased processing time required throughout with poor awareness noted.  PTA pt endorses she is from LTC, was [place] after previous admission. Pt reports she was recently attempting SPT to seated surfaces with rehab, was requiring hoyer lift at times for transfers with staff. Pt presents with deficits in strength, endurance, activity tolerance, balance all affecting safe and optimal ADL completion. Recommend continued skilled OT to address functional deficits. OT will continue to follow acutely.      Recommendations for follow up therapy are one component of a multi-disciplinary discharge planning process, led by the attending physician.  Recommendations may be updated based on patient status, additional functional criteria and insurance authorization.   Follow Up Recommendations  Skilled nursing-short term rehab (<3 hours/day)     Assistance Recommended at Discharge Frequent or constant Supervision/Assistance  Patient can return home with the following A lot of help with walking and/or transfers;A lot of help with  bathing/dressing/bathroom    Functional Status Assessment  Patient has had a recent decline in their functional status and demonstrates the ability to make significant improvements in function in a reasonable and predictable amount of time.  Equipment Recommendations  Other (comment) (defer)    Recommendations for Other Services       Precautions / Restrictions Precautions Precautions: Fall Restrictions Weight Bearing Restrictions: No      Mobility Bed Mobility Overal bed mobility: Needs Assistance Bed Mobility: Supine to Sit, Sit to Supine     Supine to sit: Max assist, HOB elevated Sit to supine: Max assist, HOB elevated   General bed mobility comments: with use of bed features, pt able to bring BLE off edge of bed, requires assist to bring them back into bed; +2 for boost however pt assits with use of bed features and pulling upward    Transfers                          Balance Overall balance assessment: Needs assistance Sitting-balance support: Feet supported Sitting balance-Leahy Scale: Fair   Postural control: Left lateral lean (able to self correct)                                 ADL either performed or assessed with clinical judgement   ADL Overall ADL's : Needs assistance/impaired     Grooming: Wash/dry face;Sitting;Supervision/safety Grooming Details (indicate cue type and reason): increased time Upper Body Bathing: Moderate assistance   Lower Body Bathing: Maximal assistance   Upper Body Dressing : Moderate assistance   Lower Body Dressing: Maximal assistance Lower Body Dressing Details (indicate cue type and reason): socks  Toileting- Clothing Manipulation and Hygiene: Total assistance Toileting - Clothing Manipulation Details (indicate cue type and reason): anticipated             Vision Patient Visual Report: No change from baseline Vision Assessment?: Yes (tracking, scanning appear WFL however pt requires  increased time for task completion, will benefit from further assessment) Eye Alignment: Within Functional Limits     Perception     Praxis      Pertinent Vitals/Pain Pain Assessment Pain Assessment: No/denies pain     Hand Dominance Right   Extremity/Trunk Assessment Upper Extremity Assessment Upper Extremity Assessment: Generalized weakness;RUE deficits/detail;LUE deficits/detail RUE Deficits / Details: A/PROM grossly WFL: global weakness throughout with 3-3+/5 throughout; weak grip strength, slightly stronger than the left however impaired FMC/dexterity/coordination RUE Sensation: WNL RUE Coordination: decreased fine motor;decreased gross motor LUE Deficits / Details: AROM: shoulder flexion: 3/4 full AROM, elbow, wrist WFL; PROM appears WFL; global weakness throughout 3/5 with poor grip strength LUE Sensation: WNL LUE Coordination: decreased fine motor;decreased gross motor   Lower Extremity Assessment Lower Extremity Assessment: Generalized weakness       Communication     Cognition Arousal/Alertness: Awake/alert Behavior During Therapy: Flat affect Overall Cognitive Status: No family/caregiver present to determine baseline cognitive functioning Area of Impairment: Orientation, Attention, Memory, Following commands, Safety/judgement, Awareness, Problem solving                 Orientation Level: Disoriented to, Situation Current Attention Level: Sustained Memory: Decreased short-term memory Following Commands: Follows one step commands with increased time Safety/Judgement: Decreased awareness of safety, Decreased awareness of deficits Awareness: Emergent Problem Solving: Slow processing, Requires verbal cues, Requires tactile cues General Comments: pt attempting to order breakfast, attempting to order while on hold     General Comments  vitals monitored, appear stable throughout; noted wounds on RLE    Exercises     Shoulder Instructions      Home  Living Family/patient expects to be discharged to:: Other (Comment) (LTC) Living Arrangements: Other relatives                               Additional Comments: LTC resident at [facility]      Prior Functioning/Environment Prior Level of Function : Needs assist             Mobility Comments: wheelchair for MRADLs, SPT between surfaces although pt does endorse need for patient care lift recently ADLs Comments: assist for ADLs from staff at [facility] per pt report        OT Problem List: Decreased strength;Decreased activity tolerance;Impaired balance (sitting and/or standing);Decreased safety awareness;Decreased knowledge of use of DME or AE      OT Treatment/Interventions: Self-care/ADL training;Therapeutic exercise;Balance training;Therapeutic activities;Patient/family education;DME and/or AE instruction    OT Goals(Current goals can be found in the care plan section) Acute Rehab OT Goals Patient Stated Goal: rehab OT Goal Formulation: With patient Time For Goal Achievement: 08/06/22 Potential to Achieve Goals: Good ADL Goals Pt Will Perform Grooming: with min assist;sitting Pt Will Perform Lower Body Dressing: with mod assist Pt Will Transfer to Toilet: with mod assist;with +2 assist;stand pivot transfer Pt Will Perform Toileting - Clothing Manipulation and hygiene: with mod assist;sitting/lateral leans  OT Frequency: Min 2X/week    Co-evaluation              AM-PAC OT "6 Clicks" Daily Activity     Outcome Measure Help from  another person eating meals?: A Little Help from another person taking care of personal grooming?: A Lot Help from another person toileting, which includes using toliet, bedpan, or urinal?: A Lot Help from another person bathing (including washing, rinsing, drying)?: A Lot Help from another person to put on and taking off regular upper body clothing?: A Lot Help from another person to put on and taking off regular lower body  clothing?: A Lot 6 Click Score: 13   End of Session Equipment Utilized During Treatment: Rolling walker (2 wheels) Nurse Communication: Mobility status  Activity Tolerance: Patient tolerated treatment well Patient left: in bed;with call bell/phone within reach;with bed alarm set  OT Visit Diagnosis: Unsteadiness on feet (R26.81);Other abnormalities of gait and mobility (R26.89)                Time: [id number] OT Time Calculation (min): 24 min Charges:  OT General Charges $OT Visit: 1 Visit OT Evaluation $OT Eval Moderate Complexity: 1 Mod  Shanon Payor, OTD OTR/L  07/23/22, 10:14 AM

## 2022-07-24 DIAGNOSIS — R4781 Slurred speech: Secondary | ICD-10-CM

## 2022-07-24 DIAGNOSIS — I639 Cerebral infarction, unspecified: Secondary | ICD-10-CM | POA: Diagnosis not present

## 2022-07-24 DIAGNOSIS — Z6841 Body Mass Index (BMI) 40.0 and over, adult: Secondary | ICD-10-CM

## 2022-07-24 DIAGNOSIS — D6859 Other primary thrombophilia: Secondary | ICD-10-CM | POA: Diagnosis not present

## 2022-07-24 LAB — CBC
HCT: 29.3 % — ABNORMAL LOW (ref 36.0–46.0)
Hemoglobin: 8.8 g/dL — ABNORMAL LOW (ref 12.0–15.0)
MCH: 29.2 pg (ref 26.0–34.0)
MCHC: 30 g/dL (ref 30.0–36.0)
MCV: 97.3 fL (ref 80.0–100.0)
Platelets: 169 10*3/uL (ref 150–400)
RBC: 3.01 MIL/uL — ABNORMAL LOW (ref 3.87–5.11)
RDW: 16.2 % — ABNORMAL HIGH (ref 11.5–15.5)
WBC: 5.1 10*3/uL (ref 4.0–10.5)
nRBC: 0 % (ref 0.0–0.2)

## 2022-07-24 LAB — BASIC METABOLIC PANEL
Anion gap: 6 (ref 5–15)
BUN: 15 mg/dL (ref 8–23)
CO2: 30 mmol/L (ref 22–32)
Calcium: 8.4 mg/dL — ABNORMAL LOW (ref 8.9–10.3)
Chloride: 101 mmol/L (ref 98–111)
Creatinine, Ser: 4.09 mg/dL — ABNORMAL HIGH (ref 0.44–1.00)
GFR, Estimated: 11 mL/min — ABNORMAL LOW (ref >60)
Glucose, Bld: 112 mg/dL — ABNORMAL HIGH (ref 70–99)
Potassium: 4.1 mmol/L (ref 3.5–5.1)
Sodium: 137 mmol/L (ref 135–145)

## 2022-07-24 LAB — GLUCOSE, CAPILLARY
Glucose-Capillary: 109 mg/dL — ABNORMAL HIGH (ref 70–99)
Glucose-Capillary: 159 mg/dL — ABNORMAL HIGH (ref 70–99)
Glucose-Capillary: 88 mg/dL (ref 70–99)
Glucose-Capillary: 169 mg/dL — ABNORMAL HIGH (ref 70–99)

## 2022-07-24 MED ORDER — HEPARIN SODIUM (PORCINE) 1000 UNIT/ML IJ SOLN
1000.0000 [IU] | Freq: Once | INTRAMUSCULAR | Status: AC
  Filled 2022-07-24: qty 1

## 2022-07-24 MED ORDER — ACETAMINOPHEN 325 MG PO TABS
650.0000 mg | ORAL_TABLET | ORAL | Status: DC | PRN
  Administered 2022-07-24 – 2022-07-26 (×2): 650 mg via ORAL
  Filled 2022-07-24 (×2): qty 2

## 2022-07-24 MED ORDER — HEPARIN SODIUM (PORCINE) 1000 UNIT/ML IJ SOLN
INTRAMUSCULAR | Status: AC
  Administered 2022-07-24: 1000 [IU] via INTRAVENOUS
  Filled 2022-07-24: qty 4

## 2022-07-24 MED ORDER — ACETAMINOPHEN 650 MG RE SUPP: 650.0000 mg | RECTAL | Status: DC | PRN

## 2022-07-24 MED ORDER — ACETAMINOPHEN 160 MG/5ML PO SOLN: 650.0000 mg | ORAL | Status: DC | PRN

## 2022-07-24 NOTE — Procedures (Signed)
Received patient in bed to unit.  Alert and oriented.  Informed consent signed and in chart.   TX duration: 2.03hrs  Patient tolerated well. HD circuit clotted. Machine kept on beeping due to high venous pressure caused by clotted venous chamber.  Rinseback was initiated to save blood before circuit fully clotted. Called Dr. Juleen China and relayed info. No request for restart given.   Transported back to the room  Alert, without acute distress.  Hand-off given to patient's nurse.   Access used: Right Chest HD catheter.  Access issues: red port is sluggish to flush and pull.  Total UF removed: 1L Medication(s) given: NONE  [facility]

## 2022-07-24 NOTE — Progress Notes (Addendum)
  Progress Note   Patient: Jody Nelson [id number] DOB: [birth year] DOA: 07/22/2022     2 DOS: the patient was seen and examined on 07/24/2022   Brief hospital course: 72 y.o. female with medical history significant for hypercoagulable state with multiple cryptogenic CVAs,on Eliquis, ESRD on HD MWF, anemia of CKD, DMII , obesity with BMI over 50, OSA (not on CPAP), hypertension, HFpEF, biopsy-proven sarcoidosis, history of right tibial plateau fracture 2019 with hardware and chronic wound infection, chronic bilateral lymphedema hospitalized on month ago from 2/3 to 2/11 with cellulitis and abscess of the right leg s/p I&D and on wound VAC and discharged to rehab who presented to the ED as a code stroke after she was noted to be slurring her speech while at the dinner table.  She also had a new left arm weakness and had difficulty using her left arm to feed herself.   3/15: Neuro, Nephro, Palliative care c/s. HD today. MRI showed small acute infarct 3/16: Incomplete dialysis yesterday due to increased venous pressure so will get another session today to complete  Assessment and Plan: * Acute CVA (cerebrovascular accident) ([place]) MRI brain confirmed small acute CVA Neuro c/s Echo - wnl Plan for restarting DOAC in 3 days per neuro as long as she doesn't worsen Carotid US - PT/OT/ST -SNF/long-term care at discharge.  Patient requires max assist with any movement and all ADLs.  She requires Hoyer lift for transfers - continue lipitor   Hypercoagulable state (positive anticardiolipin, elevated homocysteine, factor V Leiden deficiency) Will need long-term anticoagulation  Chronic anticoagulation Restart DOAC in 3 days if no further clinical worsening per neuro.  ESRD on hemodialysis ([place]) HD per nephro.  Dialysis terminated early yesterday due to increased venous pressure.  She is scheduled to get dialysis today  Slurred speech Due to acute CVA.  Improving.  Very mild dysarthria  Right-sided  weakness From previous stroke  Morbid obesity with BMI of 50.0-59.9, adult ([place]) Complicates overall prognosis.  Essential hypertension Continue coreg, torsemide.  H/O: CVA (cerebrovascular accident) History of multiple previous strokes  Diabetes mellitus with insulin therapy ([place]) SSI for now.  (HFpEF) heart failure with preserved ejection fraction ([place]) Well compensated at this time.  Fluid mgmt with HD        Subjective: Denies any new issues  Physical Exam: Vitals:   07/23/22 2227 07/23/22 2324 07/24/22 0335 07/24/22 0805  BP: (!) 125/46 (!) 114/35 (!) 108/41 101/71  Pulse:  77 74 71  Resp:  18 16 18   Temp:  97.9 F (36.6 C) 98 F (36.7 C) 97.6 F (36.4 C)  TempSrc:   Oral Oral  SpO2:  98% 98% 100%  Weight:      Height:       72 year old morbidly obese female lying in the bed comfortably without any acute distress Lungs clear to auscultation bilaterally Heart regular rate and rhythm Abdomen soft, benign, obese difficult exam Neuro alert and awake, mild dysarthria, left sensory deficit present in incoordination of left upper extremity bilateral lower extremity drift to bed Skin: See picture below     Data Reviewed:  Hemoglobin 8.8  Family Communication: Updated daughter over phone  Disposition: Status is: Inpatient Remains inpatient appropriate because: Management of stroke  Planned Discharge Destination: Skilled nursing facility   DVT prophylaxis-subcu heparin Time spent: 35 minutes  Author: Max Sane, MD 07/24/2022 11:03 AM  For on call review [email or web address].

## 2022-07-24 NOTE — Assessment & Plan Note (Signed)
Well compensated at this time.  Fluid mgmt with HD

## 2022-07-24 NOTE — Progress Notes (Signed)
Occupational Therapy Treatment Patient Details Name: Jody Nelson MRN: [id number] DOB: 1951/03/20 Today's Date: 07/24/2022   History of present illness Pt is a 72 year old female admitted with acute CVA right centrum semiovale and numerous chronic microhemorrhages; PMH significant for hypercoagulable state with multiple cryptogenic CVAs,on Eliquis, ESRD on HD MWF, anemia of CKD, DMII , obesity with BMI over 50, OSA (not on CPAP), hypertension, HFpEF, biopsy-proven sarcoidosis, history of right tibial plateau fracture 2019 with hardware and chronic wound infection, chronic bilateral lymphedema hospitalized on month ago from 2/3 to 2/11 with cellulitis and abscess of the right leg s/p I&D and wound VAC   OT comments  Jody Nelson was seen for OT/PT co- treatment on this date. Upon arrival to room pt supine in bed completing bath with NT, pt agreeable to tx. Pt requires MIN A exit bed, good sitting balance. MIN A + RW sit<>stand from elevated bed height, x2 as pt fatigues for 3 stands. Tolerates side steps along EOB ~3 ft CGA + RW. Pt making good progress toward goals, will continue to follow POC. Discharge recommendation remains appropriate.     Recommendations for follow up therapy are one component of a multi-disciplinary discharge planning process, led by the attending physician.  Recommendations may be updated based on patient status, additional functional criteria and insurance authorization.    Follow Up Recommendations  Skilled nursing-short term rehab (<3 hours/day)     Assistance Recommended at Discharge Frequent or constant Supervision/Assistance  Patient can return home with the following  A lot of help with walking and/or transfers;A lot of help with bathing/dressing/bathroom   Equipment Recommendations  Other (comment) (defer)    Recommendations for Other Services      Precautions / Restrictions Precautions Precautions: Fall Restrictions Weight Bearing Restrictions: No        Mobility Bed Mobility Overal bed mobility: Needs Assistance Bed Mobility: Supine to Sit, Sit to Supine     Supine to sit: Min assist, HOB elevated Sit to supine: Mod assist   General bed mobility comments: assist for BLE return to bed, trunk exiting bed    Transfers Overall transfer level: Needs assistance Equipment used: Rolling walker (2 wheels) Transfers: Sit to/from Stand Sit to Stand: Min assist, From elevated surface, +2 physical assistance           General transfer comment: stood x3 from bed height, ~3 feet steps along bed with CGA     Balance Overall balance assessment: Needs assistance Sitting-balance support: Feet supported Sitting balance-Leahy Scale: Good     Standing balance support: Bilateral upper extremity supported Standing balance-Leahy Scale: Fair                             ADL either performed or assessed with clinical judgement   ADL Overall ADL's : Needs assistance/impaired                                       General ADL Comments: MAX A don B socks bed level. MIN A + RW simulated BSC t/f      Cognition Arousal/Alertness: Awake/alert Behavior During Therapy: WFL for tasks assessed/performed, Flat affect Overall Cognitive Status: No family/caregiver present to determine baseline cognitive functioning  General Comments: good safety awareness and recall of proper transfer technqiue                   Pertinent Vitals/ Pain       Pain Assessment Pain Assessment: No/denies pain   Frequency  Min 2X/week        Progress Toward Goals  OT Goals(current goals can now be found in the care plan section)  Progress towards OT goals: Progressing toward goals  Acute Rehab OT Goals Patient Stated Goal: to go home OT Goal Formulation: With patient Time For Goal Achievement: 08/06/22 Potential to Achieve Goals: Good ADL Goals Pt Will Perform Grooming: with  min assist;sitting Pt Will Perform Lower Body Dressing: with mod assist Pt Will Transfer to Toilet: with mod assist;with +2 assist;stand pivot transfer Pt Will Perform Toileting - Clothing Manipulation and hygiene: with mod assist;sitting/lateral leans  Plan Discharge plan remains appropriate;Frequency remains appropriate    Co-evaluation    PT/OT/SLP Co-Evaluation/Treatment: Yes Reason for Co-Treatment: For patient/therapist safety;To address functional/ADL transfers PT goals addressed during session: Mobility/safety with mobility OT goals addressed during session: ADL's and self-care      AM-PAC OT "6 Clicks" Daily Activity     Outcome Measure   Help from another person eating meals?: A Little Help from another person taking care of personal grooming?: A Little Help from another person toileting, which includes using toliet, bedpan, or urinal?: A Lot Help from another person bathing (including washing, rinsing, drying)?: A Lot Help from another person to put on and taking off regular upper body clothing?: A Lot Help from another person to put on and taking off regular lower body clothing?: A Lot 6 Click Score: 14    End of Session    OT Visit Diagnosis: Unsteadiness on feet (R26.81);Other abnormalities of gait and mobility (R26.89)   Activity Tolerance Patient tolerated treatment well   Patient Left in bed;with call bell/phone within reach;with bed alarm set;with nursing/sitter in room   Nurse Communication Mobility status        Time: [id number] OT Time Calculation (min): 21 min  Charges: OT General Charges $OT Visit: 1 Visit OT Treatments $Self Care/Home Management : 8-22 mins  Dessie Coma, M.S. OTR/L  07/24/22, 10:44 AM  ascom [phone]

## 2022-07-24 NOTE — Assessment & Plan Note (Signed)
MRI brain confirmed small acute CVA Neuro c/s Echo - wnl Plan for restarting DOAC in 3 days per neuro as long as she doesn't worsen Carotid US - PT/OT/ST -SNF/long-term care at discharge.  Patient requires max assist with any movement and all ADLs.  She requires Hoyer lift for transfers - continue lipitor

## 2022-07-24 NOTE — NC FL2 (Signed)
[place] LEVEL OF CARE FORM     IDENTIFICATION  Patient Name: Jody Nelson Birthdate: Sep 09, 1950 Sex: female Admission Date (Current Location): 07/22/2022  [facility] and [place] Number:  Jody Nelson [id number] [place] and Address:         Provider Number: [id number]  Attending Physician Name and Address:  Max Sane, MD  Relative Name and Phone Number:  Jody Nelson Daughter [phone]    Current Level of Care: Hospital Recommended Level of Care: [place] Prior Approval Number:    Date Approved/Denied:   PASRR Number: [id number]  Discharge Plan: SNF    Current Diagnoses: Patient Active Problem List   Diagnosis Date Noted   Slurred speech 07/23/2022   Acute CVA (cerebrovascular accident) ([place]) 07/22/2022   Multiple open wounds of lower leg 06/17/2022   Wound infection 06/14/2022   Decubitus ulcer of heel, bilateral 06/13/2022   Hypercoagulable state (positive anticardiolipin, elevated homocysteine, factor V Leiden deficiency) 06/13/2022   Osteomyelitis of right leg ([place]) 06/12/2022   Non-healing wound of right lower extremity 06/12/2022   Cellulitis and abscess of right leg 06/12/2022   Chronic anticoagulation 06/12/2022   ESRD on hemodialysis ([place]) 12/27/2021   (HFpEF) heart failure with preserved ejection fraction ([place]) 01/31/2021   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[place]) 10/02/2020   Hereditary and idiopathic neuropathy, unspecified 10/02/2020   History of falling 10/02/2020   Insomnia, unspecified 10/02/2020   Muscle weakness (generalized) 10/02/2020   Resistance to multiple antibiotics 10/02/2020   Unspecified abnormalities of gait and mobility 10/02/2020   Unspecified lack of coordination 10/02/2020   Left-sided weakness 10/02/2020   Anemia of chronic renal failure 10/02/2020   Multiple drug resistant organism (MDRO) culture positive 09/25/2020   H/O: CVA (cerebrovascular accident) 06/11/2020   Uncontrolled type 2  diabetes mellitus with hyperglycemia, with long-term current use of insulin ([place]) 06/11/2020   Urinary tract infection 06/11/2020   Heel ulcer, left, limited to breakdown of skin ([place]) 01/04/2020   Leg ulcer, left, limited to breakdown of skin ([place]) 01/04/2020   Lymphedema, not elsewhere classified 01/04/2020   Chest pain, atypical 09/10/2019   Anemia, unspecified 09/19/2018   Cellulitis of left lower extremity 09/19/2018   Sepsis ([place]) 09/19/2018   Anticardiolipin antibody positive 08/16/2018   Elevated homocysteine 08/16/2018   Heterozygous factor V Leiden mutation ([place]) 08/16/2018   History of obstructive sleep apnea 07/18/2018   Ischemic stroke ([place]) 07/18/2018   Acute kidney failure, unspecified ([place]) 07/02/2018   Acute kidney injury superimposed on CKD ([place]) 06/29/2018   Encounter for other specified surgical aftercare 05/11/2018   Proteus (mirabilis) (morganii) as the cause of diseases classified elsewhere 05/11/2018   CKD (chronic kidney disease) stage 3, GFR 30-59 ml/min (HCC) 03/22/2018   Closed fracture of proximal end of left humerus 03/22/2018   Closed fracture of right tibial plateau with routine healing 03/22/2018   Hyperlipidemia with target LDL less than 70 07/14/2017   Morbid (severe) obesity due to excess calories ([place]) 10/20/2016   Unspecified sequelae of cerebral infarction 10/20/2016   Unspecified diastolic (congestive) heart failure ([place]) 10/20/2016   Type 2 diabetes mellitus without complications ([place]) [id number]   Primary osteoarthritis of both knees 05/21/2016   History of influenza 05/04/2016   Dyspnea on effort 03/12/2016   GERD (gastroesophageal reflux disease) 01/27/2016   Calculus of gallbladder without cholecystitis without obstruction 10/21/2015   Pseudophakia of left eye 10/07/2015   Pseudophakia of right eye 08/26/2015   Morbid obesity with BMI of 50.0-59.9, adult ([place]) 07/29/2015  Lymphedema of both lower extremities 07/08/2015   Hallux valgus,  acquired 07/08/2015   Onychomycosis due to dermatophyte 07/08/2015   At high risk for falls 12/10/2014   Essential hypertension 12/10/2014   Hypertension associated with type 2 diabetes mellitus ([place]) 12/10/2014   Vitamin D deficiency 11/21/2014   Diabetes mellitus with insulin therapy ([place]) 10/13/2012   Sarcoidosis 01/31/2012    Orientation RESPIRATION BLADDER Height & Weight     Self, Time, Situation, Place  Normal Incontinent ([place]; Peri-care) Weight: 292 lb 15.9 oz (132.9 kg) Height:  5\' 4"  (162.6 cm)  BEHAVIORAL SYMPTOMS/MOOD NEUROLOGICAL BOWEL NUTRITION STATUS   (None)  (Hx of CVA) Continent Diet (Heart Healthy/Carb Modified)  AMBULATORY STATUS COMMUNICATION OF NEEDS Skin   Extensive Assist Verbally Other (Comment) (Pressure injury 06/12/21 Pretibial right unstageable; Wound incision (open or dehisced) 06/14/22 Leg right 5X5 cm 1X1 cm 3X3 cm; Wound incision (open or dehisced) Heel left hydrocollid dressing applied)                       Personal Care Assistance Level of Assistance  Bathing, Feeding, Dressing Bathing Assistance: Maximum assistance Feeding assistance: Maximum assistance Dressing Assistance: Maximum assistance     Functional Limitations Info  Sight, Hearing, Speech Sight Info: Adequate Hearing Info: Adequate Speech Info: Impaired    SPECIAL CARE FACTORS FREQUENCY  PT (By licensed PT), OT (By licensed OT)     PT Frequency: 5 x weekly OT Frequency: 5 x weekly            Contractures      Additional Factors Info  Code Status, Allergies Code Status Info: Full Allergies Info: Sulfamethoxazole-trimethoprim           Current Medications (07/24/2022):  This is the current hospital active medication list Current Facility-Administered Medications  Medication Dose Route Frequency Provider Last Rate Last Admin   acetaminophen (TYLENOL) tablet 650 mg  650 mg Oral Q4H PRN Max Sane, MD       Or   acetaminophen (TYLENOL) 160 MG/5ML solution 650 mg   650 mg Per Tube Q4H PRN Max Sane, MD       Or   acetaminophen (TYLENOL) suppository 650 mg  650 mg Rectal Q4H PRN Max Sane, MD       aspirin EC tablet 325 mg  325 mg Oral Daily Judd Gaudier V, MD   325 mg at 07/24/22 0903   atorvastatin (LIPITOR) tablet 80 mg  80 mg Oral Daily Athena Masse, MD   80 mg at 07/24/22 [id number]   calcitRIOL (ROCALTROL) capsule 0.25 mcg  0.25 mcg Oral Daily Judd Gaudier V, MD   0.25 mcg at 07/24/22 0913   calcium acetate (PHOSLO) capsule 667 mg  667 mg Oral TID WC Athena Masse, MD   667 mg at 07/24/22 0903   carvedilol (COREG) tablet 25 mg  25 mg Oral BID WC Athena Masse, MD   25 mg at 07/24/22 [id number]   Chlorhexidine Gluconate Cloth 2 % PADS 6 each  6 each Topical Q0600 Colon Flattery, NP   6 each at 07/24/22 0620   docusate sodium (COLACE) capsule 100 mg  100 mg Oral BID Judd Gaudier V, MD   100 mg at 07/24/22 0903   gabapentin (NEURONTIN) capsule 200 mg  200 mg Oral BID Athena Masse, MD   200 mg at 07/24/22 0903   heparin injection 5,000 Units  5,000 Units Subcutaneous Q8H Athena Masse, MD   5,000 Units  at 07/24/22 0620   insulin aspart (novoLOG) injection 0-5 Units  0-5 Units Subcutaneous QHS Judd Gaudier V, MD       insulin aspart (novoLOG) injection 0-6 Units  0-6 Units Subcutaneous TID WC Athena Masse, MD   1 Units at 07/23/22 0918   insulin glargine-yfgn (SEMGLEE) injection 10 Units  10 Units Subcutaneous QHS Athena Masse, MD   10 Units at 07/23/22 2223   ipratropium-albuterol (DUONEB) 0.5-2.5 (3) MG/3ML nebulizer solution 3 mL  3 mL Nebulization Q4H PRN Athena Masse, MD       latanoprost (XALATAN) 0.005 % ophthalmic solution 1 drop  1 drop Both Eyes QHS Athena Masse, MD   1 drop at 07/23/22 2225   multivitamin (PROSIGHT) tablet 1 tablet  1 tablet Oral BID Athena Masse, MD   1 tablet at 07/23/22 2222   polyethylene glycol (MIRALAX / GLYCOLAX) packet 17 g  17 g Oral BID Naaman Plummer, MD   17 g at 07/24/22 0901   torsemide  (DEMADEX) tablet 10 mg  10 mg Oral Daily Athena Masse, MD   10 mg at 07/24/22 [id number]   traZODone (DESYREL) tablet 50 mg  50 mg Oral QHS PRN Athena Masse, MD         Discharge Medications: Please see discharge summary for a list of discharge medications.  Relevant Imaging Results:  Relevant Lab Results:   Additional Information SSN [id number]  Rebekah [place]field, LCSW

## 2022-07-24 NOTE — Evaluation (Signed)
Speech Language Pathology Evaluation Patient Details Name: Jody Nelson MRN: [id number] DOB: 01/04/1951 Today's Date: 07/24/2022 Time: [id number] SLP Time Calculation (min) (ACUTE ONLY): 55 min  Problem List:  Patient Active Problem List   Diagnosis Date Noted   Slurred speech 07/23/2022   Acute CVA (cerebrovascular accident) ([place]) 07/22/2022   Multiple open wounds of lower leg 06/17/2022   Wound infection 06/14/2022   Decubitus ulcer of heel, bilateral 06/13/2022   Hypercoagulable state (positive anticardiolipin, elevated homocysteine, factor V Leiden deficiency) 06/13/2022   Osteomyelitis of right leg ([place]) 06/12/2022   Non-healing wound of right lower extremity 06/12/2022   Cellulitis and abscess of right leg 06/12/2022   Chronic anticoagulation 06/12/2022   ESRD on hemodialysis ([place]) 12/27/2021   (HFpEF) heart failure with preserved ejection fraction ([place]) 01/31/2021   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[place]) 10/02/2020   Hereditary and idiopathic neuropathy, unspecified 10/02/2020   History of falling 10/02/2020   Insomnia, unspecified 10/02/2020   Muscle weakness (generalized) 10/02/2020   Resistance to multiple antibiotics 10/02/2020   Unspecified abnormalities of gait and mobility 10/02/2020   Unspecified lack of coordination 10/02/2020   Left-sided weakness 10/02/2020   Anemia of chronic renal failure 10/02/2020   Multiple drug resistant organism (MDRO) culture positive 09/25/2020   H/O: CVA (cerebrovascular accident) 06/11/2020   Uncontrolled type 2 diabetes mellitus with hyperglycemia, with long-term current use of insulin ([place]) 06/11/2020   Urinary tract infection 06/11/2020   Heel ulcer, left, limited to breakdown of skin ([place]) 01/04/2020   Leg ulcer, left, limited to breakdown of skin ([place]) 01/04/2020   Lymphedema, not elsewhere classified 01/04/2020   Chest pain, atypical 09/10/2019   Anemia, unspecified 09/19/2018   Cellulitis of left lower extremity 09/19/2018    Sepsis ([place]) 09/19/2018   Anticardiolipin antibody positive 08/16/2018   Elevated homocysteine 08/16/2018   Heterozygous factor V Leiden mutation ([place]) 08/16/2018   History of obstructive sleep apnea 07/18/2018   Ischemic stroke ([place]) 07/18/2018   Acute kidney failure, unspecified ([place]) 07/02/2018   Acute kidney injury superimposed on CKD ([place]) 06/29/2018   Encounter for other specified surgical aftercare 05/11/2018   Proteus (mirabilis) (morganii) as the cause of diseases classified elsewhere 05/11/2018   CKD (chronic kidney disease) stage 3, GFR 30-59 ml/min (HCC) 03/22/2018   Closed fracture of proximal end of left humerus 03/22/2018   Closed fracture of right tibial plateau with routine healing 03/22/2018   Hyperlipidemia with target LDL less than 70 07/14/2017   Morbid (severe) obesity due to excess calories ([place]) 10/20/2016   Unspecified sequelae of cerebral infarction 10/20/2016   Unspecified diastolic (congestive) heart failure ([place]) 10/20/2016   Type 2 diabetes mellitus without complications ([place]) Q000111Q   Primary osteoarthritis of both knees 05/21/2016   History of influenza 05/04/2016   Dyspnea on effort 03/12/2016   GERD (gastroesophageal reflux disease) 01/27/2016   Calculus of gallbladder without cholecystitis without obstruction 10/21/2015   Pseudophakia of left eye 10/07/2015   Pseudophakia of right eye 08/26/2015   Morbid obesity with BMI of 50.0-59.9, adult ([place]) 07/29/2015   Lymphedema of both lower extremities 07/08/2015   Hallux valgus, acquired 07/08/2015   Onychomycosis due to dermatophyte 07/08/2015   At high risk for falls 12/10/2014   Essential hypertension 12/10/2014   Hypertension associated with type 2 diabetes mellitus ([place]) 12/10/2014   Vitamin D deficiency 11/21/2014   Diabetes mellitus with insulin therapy ([place]) 10/13/2012   Sarcoidosis 01/31/2012   Past Medical History:  Past Medical History:  Diagnosis Date   (HFpEF)  heart failure with  preserved ejection fraction ([place])    a.) TTE 10/12/2012: EF >55%, triv PR, G1DD; b.) TTE 10/18/2012: EF >55%, LVH, LAE, triv MT/TR/PR; c.) TTE 06/27/2014: EF >55%, mild MAC, G1DD; d.) TTE 03/18/2017: EF 60-65%, LAE, AoV sclerosis, G1DD; e.) TTE 07/13/2016: EF >55%, LVH, triv MR/TR; f.) TTE 01/17/2019: EF >55%, LVH, GLS -18.1%, triv PR   Anemia of chronic renal failure    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   At high risk for falls    Atypical chest pain    Bilateral lower extremity edema    Chronic right sided weakness    Cryptogenic stroke ([place]) 02/27/2011   a.) MRI brain 02/27/2011: old lacunar infarct in RIGHT pons and medulla oblongata   Cryptogenic stroke ([place]) 10/11/2012   a.) CT brain 10/11/2012: tiny old lacunar infarct in head of caudate nucleus (left)   Cryptogenic stroke ([place]) 10/12/2012   a.) MRI brain 10/12/2012: multiple acute LEFT hemispheric infarcts   Cryptogenic stroke ([place]) 06/26/2014   a.) MRI brain 06/26/2014: acute RIGHT posterior frontal lobe infarct involving both and grey matter   Cryptogenic stroke ([place]) 07/29/2015   a.) MRI brain 07/29/2015: 2 foci of diffusion restriction in the LEFT parietal lobe consistent with acute infarction   Cryptogenic stroke ([place]) 07/24/2017   a.) MRI brain 07/24/2017: punctate focus of acute ischemia in the medial LEFT temporal lobe   DDD (degenerative disc disease), cervical    Depression    DOE (dyspnea on exertion)    Dysarthria as late effect of stroke    ESRD (end stage renal disease) on dialysis ([place])    a.) M-W-F   GERD (gastroesophageal reflux disease)    Hallux valgus, bilateral    Heterozygous factor V Leiden mutation ([place]) 07/18/2018   History of cardiac catheterization 08/30/2008   a.) LHC 08/30/2008: EF >60%, LVEDP 18 mmHg, normal coronaries.   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   a.) 07/18/2018 --> 22 mol/L   Hypertension    Insomnia    a.) melatonin + trazodone PRN   Long term current use of anticoagulant    a.) apixaban   Lymphedema of both lower extremities    Meralgia paresthetica, left lower limb    OSA (obstructive sleep apnea)    a.) does not require nocturnal PAP therapy   Osteoarthritis    Pneumonia    Pseudophakia of both eyes    Sarcoidosis    Secondary hyperparathyroidism of renal origin ([place])    Type 2 diabetes mellitus treated with insulin ([place])    Vitamin D deficiency    Past Surgical History:  Past Surgical History:  Procedure Laterality Date   APPLICATION OF WOUND VAC Right 06/15/2022   Procedure: APPLICATION OF WOUND VAC;  Surgeon: Leim Fabry, MD;  Location: [facility] ORS;  Service: Orthopedics;  Laterality: Right;   AV FISTULA PLACEMENT Left 04/16/2022   Procedure: INSERTION OF ARTERIOVENOUS (AV) GORE-TEX GRAFT ARM (BRACHIAL AXILLARY);  Surgeon: Katha Cabal, MD;  Location: [facility] ORS;  Service: Vascular;  Laterality: Left;   CESAREAN SECTION     DIALYSIS/PERMA CATHETER INSERTION N/A 10/14/2021   Procedure: DIALYSIS/PERMA CATHETER INSERTION;  Surgeon: Algernon Huxley, MD;  Location: [facility];  Service: Cardiovascular;  Laterality: N/A;   FRACTURE SURGERY  leg right fracture several surgeries, rods   INCISION AND DRAINAGE OF WOUND Right 06/15/2022   Procedure: IRRIGATION AND DEBRIDEMENT WOUND;  Surgeon: Leim Fabry, MD;  Location: [facility] ORS;  Service: Orthopedics;  Laterality: Right;   HPI:  Pt is a 71 year old female admitted with acute CVA right centrum semiovale and numerous chronic microhemorrhages. PMH significant for hypercoagulable state with multiple cryptogenic CVAs w/ resulting Dysarthria and weak UEs per pt report, on Eliquis, ESRD on HD MWF, anemia of CKD, DMII , Obesity with BMI  over 50, OSA (not on CPAP), hypertension, HFpEF, biopsy-proven sarcoidosis, history of right tibial plateau fracture 2019 with hardware and chronic wound infection, chronic bilateral lymphedema hospitalized on month ago from 2/3 to 2/11 with cellulitis and abscess of the right leg s/p I&D and wound VAC.  No CXR per chrat.   Assessment / Plan / Recommendation Clinical Impression   Pt seen for informal cognitive-linguistic assessment and assessment of speech intelligibility at bedside this morning. Breakfast tray present during session. Pt awake, verbal w/ Dysarthria noted. Pt followed all instructions and communicated appropriately w/ this SLP and NSG in room, as well as on the phone w/ TCs x2 that she answered. Noted weak UEs. Pt stated "my other 6 strokes gave me weakness in my hands and my speech". When instructing on Articulation strategies, pt stated "that is what they told me to do last time".  Pt on RA, afebrile. WBC WNL.  Pt appears to present w/ Dysarthria impacting her verbal communication at the word-conversation levels. When pt utilized strategies of slowing rate of speech, emphasizing speech sounds, and supporting words w/ full breath support (getting Louder), pt's Dysarthria lessened and articulation of speech improved. Speech intelligibility was 90+% when pt utilized articulatory strategies.  Education, model, and practice of articulation strategies was completed w/ pt and instruction given to [place] who was present in room intermittently in order to cue pt -- focused on over-articulation w/ Big, Loud speech; identified need to replenish breath support to complete the sentence/task w/ good articulation and clarity. Pt tended to speak more softly but noted when she emphasized words w/ increased volume and breath support/effort, intelligibility greatly improved.  No gross unilateral OM weakness noted during lingual protrusion/strength; no labial seal deficits during bolus management. However, min  slower repetitive movements during connected speech tasks. Noted improved oral motor movements when slowing down. No apparent expressive or receptive aphasia noted, no gross cognitive deficits noted; orientation x4 and pt recall of events was functional w/ general questions, object function/description, and following 2 step commands accurate. Pt was able to converse in general conversation on the phone w/ family and recognized written information on the phone. Pragmatics appeared appropriate.  Discussed strategies w/ pt; practice together and gave Handouts. Recommend pt f/u w/ Outpatient skilled ST servcies to continue working on the Dysarthria through exercises and strategies to improve verbal communication in ADLs if needed. TOC/NSG/MD updated. Pt agreed.     SLP Assessment  SLP Recommendation/Assessment: All further Speech Lanaguage Pathology  needs can be addressed in the next venue of care (as desired) SLP Visit Diagnosis: Dysarthria and anarthria (R47.1)    Recommendations for follow up therapy are one component of a multi-disciplinary discharge planning process, led by the attending physician.  Recommendations may be updated based on patient status, additional functional criteria and insurance authorization.    Follow Up Recommendations  Follow physician's recommendations for discharge plan and follow up therapies    Assistance Recommended at Discharge  PRN  Functional Status Assessment Patient has had a recent decline in their functional status and/or demonstrates limited ability to make significant improvements in function in a reasonable and predictable amount of time  Frequency and Duration  (n/a)   (n/a)      SLP Evaluation Cognition  Overall Cognitive Status: Within Functional Limits for tasks assessed Arousal/Alertness: Awake/alert Orientation Level: Oriented X4 Year: 2024 Month: March Attention: Focused;Sustained Focused Attention: Appears intact Sustained Attention:  Appears intact Memory: Appears intact (events) Awareness: Appears intact Problem Solving: Appears intact (attempting to feed self) Executive Function: Reasoning Reasoning: Appears intact (functional) Safety/Judgment: Appears intact Comments: functional       Comprehension  Auditory Comprehension Overall Auditory Comprehension: Appears within functional limits for tasks assessed Yes/No Questions: Within Functional Limits Commands: Within Functional Limits Conversation: Simple Other Conversation Comments: spoke on phone w/ family x2 Interfering Components:  (n/a) EffectiveTechniques:  (n/a) Visual Recognition/Discrimination Discrimination: Not tested Reading Comprehension Reading Status: Within funtional limits (callers' names and texts on the phone)    Expression Expression Primary Mode of Expression: Verbal Verbal Expression Overall Verbal Expression: Appears within functional limits for tasks assessed Initiation: No impairment Automatic Speech:  (wfl) Level of Generative/Spontaneous Verbalization: Conversation (general) Repetition: No impairment Naming: No impairment Pragmatics: No impairment Interfering Components: Speech intelligibility (dysarthria) Effective Techniques: Articulatory cues (min) Non-Verbal Means of Communication: Not applicable Written Expression Dominant Hand: Right Written Expression: Not tested   Oral / Motor  Oral Motor/Sensory Function Overall Oral Motor/Sensory Function: Within functional limits (no gross unilateral weakness noted - min slow repetitive movements) Motor Speech Overall Motor Speech: Impaired at baseline Respiration: Within functional limits Phonation: Normal (expiratory wheeze duirng increaesd exertion) Resonance: Within functional limits Articulation: Impaired Level of Impairment: Word Intelligibility: Intelligibility reduced Word: 75-100% accurate Phrase: 50-74% accurate Sentence: 50-74% accurate Conversation: 50-74%  accurate Motor Speech Errors: Not applicable Interfering Components: Inadequate dentition Effective Techniques: Slow rate;Increased vocal intensity;Over-articulate;Pause               Orinda Kenner, MS, CCC-SLP Speech Language Pathologist Rehab Services; [place] [phone] (ascom) Avigayil Ton 07/24/2022, 2:27 PM

## 2022-07-24 NOTE — Assessment & Plan Note (Signed)
Will need long-term anticoagulation

## 2022-07-24 NOTE — Assessment & Plan Note (Signed)
HD per nephro.  Dialysis terminated early yesterday due to increased venous pressure.  She is scheduled to get dialysis today

## 2022-07-24 NOTE — Assessment & Plan Note (Signed)
Continue coreg, torsemide.

## 2022-07-24 NOTE — Assessment & Plan Note (Signed)
SSI for now 

## 2022-07-24 NOTE — TOC Progression Note (Signed)
Transition of Care [facility]) - Progression Note    Patient Details  Name: Jody Nelson MRN: [id number] Date of Birth: 16-Jul-1950  [facility] of [facility]) CM/SW Contact  Rebekah Chesterfield, [place] Phone Number: 07/24/2022, 4:37 PM  Clinical Narrative:    CSW received consult from provider to address family's request to discuss placements in [place] spoke with pt's daughter, Gwendolyn Fill. States she has not received any assistance from [facility] staff to transfer patient to a closer facility to family. Requested CSW to submit FL2 to facilities near [place] for review.   CSW completed SNF workup and will follow for bed offers.   Expected Discharge Plan: [place] Barriers to Discharge: Continued Medical Work up, Ship broker  Expected Discharge Plan and [place] arrangements for the past 2 months: [place] Determinants of Health (SDOH) Interventions SDOH Screenings   Food Insecurity: No Food Insecurity (07/23/2022)  Housing: Low Risk  (07/23/2022)  Transportation Needs: No Transportation Needs (07/23/2022)  Utilities: Not At Risk (07/23/2022)  Tobacco Use: Medium Risk (07/22/2022)    Readmission Risk Interventions     No data to display

## 2022-07-24 NOTE — Assessment & Plan Note (Signed)
Due to acute CVA.  Improving.  Very mild dysarthria

## 2022-07-24 NOTE — Assessment & Plan Note (Signed)
History of multiple previous strokes

## 2022-07-24 NOTE — Assessment & Plan Note (Signed)
Complicates overall prognosis. 

## 2022-07-24 NOTE — Assessment & Plan Note (Signed)
Restart DOAC in 3 days if no further clinical worsening per neuro.

## 2022-07-24 NOTE — Progress Notes (Signed)
[facility]  ROUNDING NOTE   Subjective:   Jody Nelson is a 72 y.o. female with past medical history of obesity, diabetes, OSA, hypertension, CHF, anemia, and end stage renal disease on hemodialysis. Patient presents to the emergency dept from her SNF due to stroke symptoms. Patient was admitted for Slurred speech [R47.81] Left-sided weakness [R53.1] Acute CVA (cerebrovascular accident) [facility]) [I63.9]   Patient is known to our practice and receives outpatient dialysis treatments at [facility] on a MWF schedule, supervised by Dr Candiss Norse.   Patient seen sitting up in bed Preparing to receive assistance with daily ADLs Alert and oriented, remains on room air Mild lower extremity edema noted  Objective:  Vital signs in last 24 hours:  Temp:  [97.6 F (36.4 C)-98.2 F (36.8 C)] 97.6 F (36.4 C) (03/16 0805) Pulse Rate:  [71-81] 71 (03/16 0805) Resp:  [11-21] 18 (03/16 0805) BP: (101-134)/(35-94) 101/71 (03/16 0805) SpO2:  [98 %-100 %] 100 % (03/16 0805) Weight:  [134.5 kg-134.9 kg] 134.5 kg (03/15 1847)  Weight change: -1.179 kg Filed Weights   07/23/22 0243 07/23/22 1444 07/23/22 1847  Weight: 134.9 kg 134.9 kg 134.5 kg    Intake/Output: I/O last 3 completed shifts: In: 120 [P.O.:120] Out: 100 [Other:100]   Intake/Output this shift:  No intake/output data recorded.  Physical Exam: General: NAD  Head: Normocephalic, atraumatic. Moist oral mucosal membranes  Eyes: Anicteric  Lungs:  Clear to auscultation, normal effort.   Heart: Regular rate and rhythm  Abdomen:  Soft, nontender, obese  Extremities:  trace peripheral edema.   Neurologic: Alert, Left arm weakness  Skin: No lesions  Access: Lt AVG, Rt permcath    Basic Metabolic Panel: Recent Labs  Lab 07/22/22 2030 07/24/22 0316  NA 138 137  K 4.1 4.1  CL 101 101  CO2 28 30  GLUCOSE 178* 112*  BUN 18 15  CREATININE 4.75* 4.09*  CALCIUM 8.6* 8.4*     Liver Function Tests: Recent Labs   Lab 07/22/22 2030  AST 25  ALT 16  ALKPHOS 69  BILITOT 0.6  PROT 7.0  ALBUMIN 2.6*    No results for input(s): "LIPASE", "AMYLASE" in the last 168 hours. No results for input(s): "AMMONIA" in the last 168 hours.  CBC: Recent Labs  Lab 07/22/22 2030 07/24/22 0316  WBC 7.7 5.1  NEUTROABS 4.3  --   HGB 9.2* 8.8*  HCT 31.5* 29.3*  MCV 98.4 97.3  PLT 186 169     Cardiac Enzymes: No results for input(s): "CKTOTAL", "CKMB", "CKMBINDEX", "TROPONINI" in the last 168 hours.  BNP: Invalid input(s): "POCBNP"  CBG: Recent Labs  Lab 07/23/22 0211 07/23/22 0819 07/23/22 1156 07/23/22 2220 07/24/22 0801  GLUCAP 161* 169* 125* 158* 109*     Microbiology: Results for orders placed or performed during the hospital encounter of 06/12/22  MRSA Next Gen by PCR, Nasal     Status: None   Collection Time: 06/13/22 12:45 AM   Specimen: Nasal Mucosa; Nasal Swab  Result Value Ref Range Status   MRSA by PCR Next Gen NOT DETECTED NOT DETECTED Final    Comment: (NOTE) The GeneXpert MRSA Assay (FDA approved for NASAL specimens only), is one component of a comprehensive MRSA colonization surveillance program. It is not intended to diagnose MRSA infection nor to guide or monitor treatment for MRSA infections. Test performance is not FDA approved in patients less than 58 years old. Performed at [facility], [address] [place], [place] [address]  Culture, blood (Routine X 2) w Reflex to ID Panel     Status: None   Collection Time: 06/13/22  9:34 AM   Specimen: BLOOD  Result Value Ref Range Status   Specimen Description BLOOD BLOOD LEFT HAND  Final   Special Requests   Final    BOTTLES DRAWN AEROBIC AND ANAEROBIC Blood Culture adequate volume   Culture   Final    NO GROWTH 5 DAYS Performed at [facility], [address] [place], [place] [address]    Report Status 06/18/2022 FINAL  Final  Aerobic Culture w Gram Stain (superficial specimen)     Status:  None   Collection Time: 06/13/22  9:35 AM   Specimen: Abscess  Result Value Ref Range Status   Specimen Description   Final    ABSCESS S T Performed at [facility], [address] [place], [place] [address]    Special Requests   Final    NONE Performed at [facility], [address] [place], [place] [address]    Gram Stain   Final    FEW GRAM POSITIVE COCCI IN SINGLES IN PAIRS RARE GRAM VARIABLE ROD NO WBC SEEN Performed at [facility], [place] [address], [place], [place] [address]    Culture   Final    FEW MORGANELLA MORGANII FEW ESCHERICHIA COLI FEW STAPHYLOCOCCUS AUREUS    Report Status 06/17/2022 FINAL  Final   Organism ID, Bacteria MORGANELLA MORGANII  Final   Organism ID, Bacteria ESCHERICHIA COLI  Final   Organism ID, Bacteria STAPHYLOCOCCUS AUREUS  Final      Susceptibility   Escherichia coli - MIC*    AMPICILLIN >=32 RESISTANT Resistant     CEFAZOLIN <=4 SENSITIVE Sensitive     CEFEPIME <=0.12 SENSITIVE Sensitive     CEFTAZIDIME <=1 SENSITIVE Sensitive     CEFTRIAXONE <=0.25 SENSITIVE Sensitive     CIPROFLOXACIN >=4 RESISTANT Resistant     GENTAMICIN <=1 SENSITIVE Sensitive     IMIPENEM <=0.25 SENSITIVE Sensitive     TRIMETH/SULFA >=320 RESISTANT Resistant     AMPICILLIN/SULBACTAM 16 INTERMEDIATE Intermediate     PIP/TAZO <=4 SENSITIVE Sensitive     * FEW ESCHERICHIA COLI   Morganella morganii - MIC*    AMPICILLIN >=32 RESISTANT Resistant     CEFAZOLIN RESISTANT Resistant     CEFTAZIDIME <=1 SENSITIVE Sensitive     CIPROFLOXACIN >=4 RESISTANT Resistant     GENTAMICIN <=1 SENSITIVE Sensitive     IMIPENEM 0.5 SENSITIVE Sensitive     TRIMETH/SULFA >=320 RESISTANT Resistant     AMPICILLIN/SULBACTAM 16 INTERMEDIATE Intermediate     PIP/TAZO <=4 SENSITIVE Sensitive     * FEW MORGANELLA MORGANII   Staphylococcus aureus - MIC*    CIPROFLOXACIN >=8 RESISTANT Resistant     ERYTHROMYCIN >=8 RESISTANT Resistant     GENTAMICIN <=0.5  SENSITIVE Sensitive     OXACILLIN 2 SENSITIVE Sensitive     TETRACYCLINE >=16 RESISTANT Resistant     VANCOMYCIN 1 SENSITIVE Sensitive     TRIMETH/SULFA >=320 RESISTANT Resistant     CLINDAMYCIN <=0.25 SENSITIVE Sensitive     RIFAMPIN <=0.5 SENSITIVE Sensitive     Inducible Clindamycin NEGATIVE Sensitive     * FEW STAPHYLOCOCCUS AUREUS  Culture, blood (Routine X 2) w Reflex to ID Panel     Status: None   Collection Time: 06/13/22  2:23 PM   Specimen: BLOOD  Result Value Ref Range Status   Specimen Description BLOOD BLOOD RIGHT  FOREARM  Final   Special Requests   Final    BOTTLES DRAWN AEROBIC AND ANAEROBIC Blood Culture adequate volume   Culture   Final    NO GROWTH 5 DAYS Performed at [facility], [address] [place], [place] [address]    Report Status 06/18/2022 FINAL  Final  Aerobic/Anaerobic Culture w Gram Stain (surgical/deep wound)     Status: None   Collection Time: 06/15/22  2:37 PM   Specimen: Leg, Right; Wound  Result Value Ref Range Status   Specimen Description   Final    WOUND Performed at [facility], [address] [place], [place] [address]    Special Requests   Final    RIGHT LEG Performed at [facility], [address] [place], [place] [address]    Gram Stain   Final    FEW WBC PRESENT,BOTH PMN AND MONONUCLEAR RARE GRAM POSITIVE COCCI IN PAIRS    Culture   Final    RARE METHICILLIN RESISTANT STAPHYLOCOCCUS AUREUS RARE ESCHERICHIA COLI RARE GROUP B STREP(S.AGALACTIAE)ISOLATED TESTING AGAINST S. AGALACTIAE NOT ROUTINELY PERFORMED DUE TO PREDICTABILITY OF AMP/PEN/VAN SUSCEPTIBILITY. FEW MORGANELLA MORGANII NO ANAEROBES ISOLATED Performed at [facility], [place] [address] [place],  [address]    Report Status 06/21/2022 FINAL  Final   Organism ID, Bacteria ESCHERICHIA COLI  Final   Organism ID, Bacteria MORGANELLA MORGANII  Final   Organism ID, Bacteria METHICILLIN RESISTANT STAPHYLOCOCCUS AUREUS  Final       Susceptibility   Escherichia coli - MIC*    AMPICILLIN >=32 RESISTANT Resistant     CEFAZOLIN <=4 SENSITIVE Sensitive     CEFEPIME <=0.12 SENSITIVE Sensitive     CEFTAZIDIME <=1 SENSITIVE Sensitive     CEFTRIAXONE <=0.25 SENSITIVE Sensitive     CIPROFLOXACIN >=4 RESISTANT Resistant     GENTAMICIN <=1 SENSITIVE Sensitive     IMIPENEM <=0.25 SENSITIVE Sensitive     TRIMETH/SULFA >=320 RESISTANT Resistant     AMPICILLIN/SULBACTAM 16 INTERMEDIATE Intermediate     PIP/TAZO <=4 SENSITIVE Sensitive     * RARE ESCHERICHIA COLI   Morganella morganii - MIC*    AMPICILLIN >=32 RESISTANT Resistant     CEFAZOLIN >=64 RESISTANT Resistant     CEFTAZIDIME <=1 SENSITIVE Sensitive     CIPROFLOXACIN >=4 RESISTANT Resistant     GENTAMICIN <=1 SENSITIVE Sensitive     IMIPENEM 2 SENSITIVE Sensitive     TRIMETH/SULFA >=320 RESISTANT Resistant     AMPICILLIN/SULBACTAM >=32 RESISTANT Resistant     PIP/TAZO <=4 SENSITIVE Sensitive     * FEW MORGANELLA MORGANII   Methicillin resistant staphylococcus aureus - MIC*    CIPROFLOXACIN >=8 RESISTANT Resistant     ERYTHROMYCIN >=8 RESISTANT Resistant     GENTAMICIN <=0.5 SENSITIVE Sensitive     OXACILLIN >=4 RESISTANT Resistant     TETRACYCLINE >=16 RESISTANT Resistant     VANCOMYCIN 1 SENSITIVE Sensitive     TRIMETH/SULFA >=320 RESISTANT Resistant     CLINDAMYCIN <=0.25 SENSITIVE Sensitive     RIFAMPIN <=0.5 SENSITIVE Sensitive     Inducible Clindamycin NEGATIVE Sensitive     * RARE METHICILLIN RESISTANT STAPHYLOCOCCUS AUREUS    Coagulation Studies: Recent Labs    07/22/22 2030  LABPROT 14.9  INR 1.2     Urinalysis: No results for input(s): "COLORURINE", "LABSPEC", "PHURINE", "GLUCOSEU", "HGBUR", "BILIRUBINUR", "KETONESUR", "PROTEINUR", "UROBILINOGEN", "NITRITE", "LEUKOCYTESUR" in the last 72 hours.  Invalid input(s): "APPERANCEUR"    Imaging: ECHOCARDIOGRAM COMPLETE  Result Date: 07/23/2022  ECHOCARDIOGRAM REPORT   Patient Name:    MYKELLE HUTZLER Date of Exam: 07/23/2022 Medical Rec #:  [id number]   Height:       64.0 in Accession #:    [id number]  Weight:       297.4 lb Date of Birth:  February 13, 1951   BSA:          2.316 m Patient Age:    43 years    BP:           131/47 mmHg Patient Gender: F           HR:           83 bpm. Exam Location:  [facility] Procedure: 2D Echo, Cardiac Doppler and Color Doppler Indications:     Stroke  History:         Patient has no prior history of Echocardiogram examinations.                  CHF, Stroke, Signs/Symptoms:Chest Pain and Dyspnea; Risk                  Factors:Hypertension, Diabetes, Dyslipidemia and Sleep Apnea.  Sonographer:     Wenda Low Referring Phys:  [id number] Athena Masse Diagnosing Phys: Ida Rogue MD  Sonographer Comments: Technically difficult study due to poor echo windows and patient is obese. Image acquisition challenging due to respiratory motion. IMPRESSIONS  1. Left ventricular ejection fraction, by estimation, is 60 to 65%. The left ventricle has normal function. The left ventricle has no regional wall motion abnormalities. There is mild left ventricular hypertrophy. Left ventricular diastolic parameters are consistent with Grade I diastolic dysfunction (impaired relaxation).  2. Right ventricular systolic function is normal. The right ventricular size is normal.  3. The mitral valve is normal in structure. No evidence of mitral valve regurgitation. No evidence of mitral stenosis.  4. The aortic valve has an indeterminant number of cusps. Aortic valve regurgitation is not visualized. Aortic valve sclerosis/calcification is present, without any evidence of aortic stenosis. Aortic valve mean gradient measures 5.0 mmHg.  5. The inferior vena cava is normal in size with greater than 50% respiratory variability, suggesting right atrial pressure of 3 mmHg. FINDINGS  Left Ventricle: Left ventricular ejection fraction, by estimation, is 60 to 65%. The left ventricle has normal function. The  left ventricle has no regional wall motion abnormalities. The left ventricular internal cavity size was normal in size. There is  mild left ventricular hypertrophy. Left ventricular diastolic parameters are consistent with Grade I diastolic dysfunction (impaired relaxation). Right Ventricle: The right ventricular size is normal. No increase in right ventricular wall thickness. Right ventricular systolic function is normal. Left Atrium: Left atrial size was normal in size. Right Atrium: Right atrial size was normal in size. Pericardium: Trivial pericardial effusion is present. Mitral Valve: The mitral valve is normal in structure. No evidence of mitral valve regurgitation. No evidence of mitral valve stenosis. MV peak gradient, 5.6 mmHg. The mean mitral valve gradient is 2.0 mmHg. Tricuspid Valve: The tricuspid valve is normal in structure. Tricuspid valve regurgitation is trivial. No evidence of tricuspid stenosis. Aortic Valve: The aortic valve has an indeterminant number of cusps. Aortic valve regurgitation is not visualized. Aortic valve sclerosis/calcification is present, without any evidence of aortic stenosis. Aortic valve mean gradient measures 5.0 mmHg. Aortic valve peak gradient measures 9.4 mmHg. Aortic valve area, by VTI measures 2.35 cm. Pulmonic Valve: The pulmonic valve was normal in structure. Pulmonic valve regurgitation is  not visualized. No evidence of pulmonic stenosis. Aorta: The aortic root is normal in size and structure. Venous: The inferior vena cava is normal in size with greater than 50% respiratory variability, suggesting right atrial pressure of 3 mmHg. IAS/Shunts: No atrial level shunt detected by color flow Doppler.  LEFT VENTRICLE PLAX 2D LVIDd:         3.70 cm   Diastology LVIDs:         2.30 cm   LV e' medial:    8.27 cm/s LV PW:         1.10 cm   LV E/e' medial:  12.7 LV IVS:        1.20 cm   LV e' lateral:   10.60 cm/s LVOT diam:     2.00 cm   LV E/e' lateral: 9.9 LV SV:          83 LV SV Index:   36 LVOT Area:     3.14 cm  RIGHT VENTRICLE RV Basal diam:  4.10 cm RV Mid diam:    3.30 cm RV S prime:     17.20 cm/s TAPSE (M-mode): 2.4 cm LEFT ATRIUM             Index        RIGHT ATRIUM           Index LA diam:        3.20 cm 1.38 cm/m   RA Area:     18.40 cm LA Vol (A2C):   72.1 ml 31.14 ml/m  RA Volume:   59.20 ml  25.56 ml/m LA Vol (A4C):   75.1 ml 32.43 ml/m LA Biplane Vol: 78.9 ml 34.07 ml/m  AORTIC VALVE                     PULMONIC VALVE AV Area (Vmax):    2.28 cm      PV Vmax:       1.05 m/s AV Area (Vmean):   2.22 cm      PV Peak grad:  4.4 mmHg AV Area (VTI):     2.35 cm AV Vmax:           153.00 cm/s AV Vmean:          106.000 cm/s AV VTI:            0.355 m AV Peak Grad:      9.4 mmHg AV Mean Grad:      5.0 mmHg LVOT Vmax:         111.00 cm/s LVOT Vmean:        74.900 cm/s LVOT VTI:          0.265 m LVOT/AV VTI ratio: 0.75  AORTA Ao Root diam: 2.80 cm MITRAL VALVE MV Area (PHT): 3.23 cm     SHUNTS MV Area VTI:   3.00 cm     Systemic VTI:  0.26 m MV Peak grad:  5.6 mmHg     Systemic Diam: 2.00 cm MV Mean grad:  2.0 mmHg MV Vmax:       1.18 m/s MV Vmean:      71.0 cm/s MV Decel Time: 235 msec MV E velocity: 105.00 cm/s MV A velocity: 85.90 cm/s MV E/A ratio:  1.22 Ida Rogue MD Electronically signed by Ida Rogue MD Signature Date/Time: 07/23/2022/4:20:27 PM    Final    MR BRAIN WO CONTRAST  Result Date: 07/22/2022 CLINICAL DATA:  Acute neurologic deficit EXAM: MRI HEAD WITHOUT  CONTRAST TECHNIQUE: Multiplanar, multiecho pulse sequences of the brain and surrounding structures were obtained without intravenous contrast. COMPARISON:  Brain MRI 03/02/2022 FINDINGS: Brain: Small acute infarct of the right centrum semiovale. No acute hemorrhage or mass effect. Numerous chronic microhemorrhages in a mixed central and peripheral distribution. 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 Normal white matter signal, parenchymal volume and CSF spaces. The midline  structures are normal. Vascular: Normal flow voids. Skull and upper cervical spine: Normal marrow signal. Sinuses/Orbits: Negative. Other: None. IMPRESSION: 1. Small acute infarct of the right centrum semiovale. 2. Numerous chronic microhemorrhages in a mixed central and peripheral distribution, which could be due to chronic hypertensive or cerebral amyloid angiopathy. 3. 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 Electronically Signed   By: Ulyses Jarred M.D.   On: 07/22/2022 23:38   CT HEAD CODE STROKE WO CONTRAST  Result Date: 07/22/2022 CLINICAL DATA:  Code stroke.  Left-sided weakness EXAM: CT HEAD WITHOUT CONTRAST TECHNIQUE: Contiguous axial images were obtained from the base of the skull through the vertex without intravenous contrast. RADIATION DOSE REDUCTION: This exam was performed according to the departmental dose-optimization program which includes automated exposure control, adjustment of the mA and/or kV according to patient size and/or use of iterative reconstruction technique. COMPARISON:  05/01/2022 FINDINGS: Brain: There is no mass, hemorrhage or extra-axial collection. The size and configuration of the ventricles and extra-axial CSF spaces are normal. 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 Vascular: No abnormal hyperdensity of the major intracranial arteries or dural venous sinuses. No intracranial atherosclerosis. Skull: The visualized skull base, calvarium and extracranial soft tissues are normal. Sinuses/Orbits: No fluid levels or advanced mucosal thickening of the visualized paranasal sinuses. No mastoid or middle ear effusion. The orbits are normal. ASPECTS [facility] Stroke Program Early CT Score) - Ganglionic level infarction (caudate, lentiform nuclei, internal capsule, insula, M1-M3 cortex): 7 - Supraganglionic infarction (M4-M6 cortex): 3 Total score (0-10 with 10 being normal): 10 IMPRESSION: 1. No acute intracranial abnormality. 2.  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 3. ASPECTS is 10. These results were called by telephone at the time of interpretation on 07/22/2022 at 8:01 pm to provider [facility] , who verbally acknowledged these results. Electronically Signed   By: Ulyses Jarred M.D.   On: 07/22/2022 20:01     Medications:      aspirin EC  325 mg Oral Daily   atorvastatin  80 mg Oral Daily   calcitRIOL  0.25 mcg Oral Daily   calcium acetate  667 mg Oral TID WC   carvedilol  25 mg Oral BID WC   Chlorhexidine Gluconate Cloth  6 each Topical Q0600   docusate sodium  100 mg Oral BID   gabapentin  200 mg Oral BID   heparin  5,000 Units Subcutaneous Q8H   insulin aspart  0-5 Units Subcutaneous QHS   insulin aspart  0-6 Units Subcutaneous TID WC   insulin glargine-yfgn  10 Units Subcutaneous QHS   latanoprost  1 drop Both Eyes QHS   multivitamin  1 tablet Oral BID   polyethylene glycol  17 g Oral BID   torsemide  10 mg Oral Daily   acetaminophen **OR** acetaminophen (TYLENOL) oral liquid 160 mg/5 mL **OR** acetaminophen, HYDROcodone-acetaminophen, ipratropium-albuterol, traZODone  Assessment/ Plan:  [name] is a 72 y.o.  female with past medical history of obesity, diabetes, OSA, hypertension, CHF, anemia, and end stage renal disease on hemodialysis. Patient presents  to the emergency dept from her SNF due to stroke symptoms. Patient was admitted for Slurred speech [R47.81] Left-sided weakness [R53.1] Acute CVA (cerebrovascular accident) ([facility]) [I63.9]  [facility]/MWF/LT AVG/Rt permcth/134kg  End stage renal disease on hemodialysis. Patient received partial dialysis treatment yesterday, treatment terminated early due to increased venous pressures. Patient will complete dialysis treatment today Next treatment scheduled for Monday.  2.Anemia of chronic kidney disease Lab Results  Component Value Date   HGB 8.8 (L) 07/24/2022    Hemoglobin just  below target.  Will continue to monitor for ESA's.  3. Secondary Hyperparathyroidism: with outpatient labs: PTH 463, phosphorus 6.8, calcium 8.4 on 05/17/22    Lab Results  Component Value Date   CALCIUM 8.4 (L) 07/24/2022   CAION 1.13 (L) 04/16/2022   PHOS 4.5 06/21/2022    Continue calcium acetate and calcitriol with meals.   4. Diabetes mellitus type II with chronic kidney disease/renal manifestations: insulin dependent. Home regimen includes Lantus.   Primary team to manage sliding scale insulin   LOS: 2 Daronte Shostak 3/16/202410:47 AM

## 2022-07-24 NOTE — Assessment & Plan Note (Signed)
From previous stroke

## 2022-07-24 NOTE — Evaluation (Signed)
Physical Therapy Evaluation Patient Details Name: Jody Nelson MRN: [id number] DOB: 1950/05/15 Today's Date: 07/24/2022  History of Present Illness  Pt is a 72 year old female admitted with acute CVA right centrum semiovale and numerous chronic microhemorrhages; PMH significant for hypercoagulable state with multiple cryptogenic CVAs,on Eliquis, ESRD on HD MWF, anemia of CKD, DMII , obesity with BMI over 50, OSA (not on CPAP), hypertension, HFpEF, biopsy-proven sarcoidosis, history of right tibial plateau fracture 2019 with hardware and chronic wound infection, chronic bilateral lymphedema hospitalized on month ago from 2/3 to 2/11 with cellulitis and abscess of the right leg s/p I&D and wound VAC  Clinical Impression  Patient received in bed with NT in room finishing bath. Patient is agreeable to PT session. She is pleasant and motivated. Patient required min A with bed mobility with heavy use of rails. She is able to stand from elevated bed with min A +2. Patient ambulated along edge of bed (sidestepping) x 3 feet with RW and min A +2. Performed sit to stand 2 additional times at edge of bed also with min A +2. She will continue to benefit from skilled PT to improve strength and functional independence.        Recommendations for follow up therapy are one component of a multi-disciplinary discharge planning process, led by the attending physician.  Recommendations may be updated based on patient status, additional functional criteria and insurance authorization.  Follow Up Recommendations Skilled nursing-short term rehab (<3 hours/day) Can patient physically be transported by private vehicle: No    Assistance Recommended at Discharge Frequent or constant Supervision/Assistance  Patient can return home with the following  A lot of help with walking and/or transfers;A lot of help with bathing/dressing/bathroom;Assist for transportation    Equipment Recommendations None recommended by PT   Recommendations for Other Services       Functional Status Assessment Patient has had a recent decline in their functional status and demonstrates the ability to make significant improvements in function in a reasonable and predictable amount of time.     Precautions / Restrictions Precautions Precautions: Fall Restrictions Weight Bearing Restrictions: No      Mobility  Bed Mobility Overal bed mobility: Needs Assistance Bed Mobility: Supine to Sit, Sit to Supine, Rolling Rolling: Mod assist   Supine to sit: Min assist Sit to supine: Min assist   General bed mobility comments: assist for BLE return to bed, trunk exiting bed    Transfers Overall transfer level: Needs assistance Equipment used: Rolling walker (2 wheels) Transfers: Sit to/from Stand Sit to Stand: Min assist, +2 physical assistance, From elevated surface           General transfer comment: stood x3 from bed height, ~3 feet steps along bed with CGA    Ambulation/Gait Ambulation/Gait assistance: Min guard Gait Distance (Feet): 3 Feet Assistive device: Rolling walker (2 wheels) Gait Pattern/deviations: Step-to pattern, Decreased step length - right, Decreased step length - left Gait velocity: decr     General Gait Details: side steps at edge of bed  Stairs            Wheelchair Mobility    Modified Rankin (Stroke Patients Only) Modified Rankin (Stroke Patients Only) Pre-Morbid Rankin Score: Moderately severe disability Modified Rankin: Moderately severe disability     Balance Overall balance assessment: Needs assistance Sitting-balance support: Feet supported Sitting balance-Leahy Scale: Good     Standing balance support: Bilateral upper extremity supported, During functional activity, Reliant on assistive device for balance Standing  balance-Leahy Scale: Fair                               Pertinent Vitals/Pain Pain Assessment Pain Assessment: No/denies pain    Home  Living Family/patient expects to be discharged to:: Skilled nursing facility                   Additional Comments: LTC resident at [facility]    Prior Function Prior Level of Function : Needs assist             Mobility Comments: SPT between surfaces although pt does endorse need for patient care lift recently ADLs Comments: assist for ADLs from staff at [facility] per pt report     Hand Dominance   Dominant Hand: Right    Extremity/Trunk Assessment   Upper Extremity Assessment Upper Extremity Assessment: Defer to OT evaluation    Lower Extremity Assessment Lower Extremity Assessment: Generalized weakness    Cervical / Trunk Assessment Cervical / Trunk Assessment: Normal  Communication   Communication: HOH  Cognition Arousal/Alertness: Awake/alert Behavior During Therapy: WFL for tasks assessed/performed Overall Cognitive Status: Within Functional Limits for tasks assessed                                 General Comments: good safety awareness and recall of proper transfer technqiue        General Comments      Exercises     Assessment/Plan    PT Assessment Patient needs continued PT services  PT Problem List Decreased strength;Decreased activity tolerance;Decreased balance;Decreased mobility;Decreased skin integrity;Obesity       PT Treatment Interventions DME instruction;Gait training;Functional mobility training;Therapeutic activities;Patient/family education;Balance training;Therapeutic exercise    PT Goals (Current goals can be found in the Care Plan section)  Acute Rehab PT Goals Patient Stated Goal: to return to SNF and work with therapy. She wants to be able to walk. PT Goal Formulation: With patient Time For Goal Achievement: 08/07/22 Potential to Achieve Goals: Fair    Frequency 7X/week     Co-evaluation PT/OT/SLP Co-Evaluation/Treatment: Yes Reason for Co-Treatment: Necessary to address cognition/behavior during  functional activity;For patient/therapist safety;To address functional/ADL transfers PT goals addressed during session: Mobility/safety with mobility;Balance;Proper use of DME OT goals addressed during session: ADL's and self-care       AM-PAC PT "6 Clicks" Mobility  Outcome Measure Help needed turning from your back to your side while in a flat bed without using bedrails?: A Lot Help needed moving from lying on your back to sitting on the side of a flat bed without using bedrails?: A Lot Help needed moving to and from a bed to a chair (including a wheelchair)?: A Lot Help needed standing up from a chair using your arms (e.g., wheelchair or bedside chair)?: A Little Help needed to walk in hospital room?: A Lot Help needed climbing 3-5 steps with a railing? : Total 6 Click Score: 12    End of Session   Activity Tolerance: Patient limited by fatigue Patient left: in bed;with call bell/phone within reach;with bed alarm set Nurse Communication: Mobility status PT Visit Diagnosis: Other abnormalities of gait and mobility (R26.89);Muscle weakness (generalized) (M62.81);Difficulty in walking, not elsewhere classified (R26.2);History of falling (Z91.81)    Time: [id number] PT Time Calculation (min) (ACUTE ONLY): 22 min   Charges:   PT Evaluation $PT Eval Moderate Complexity:  1 Mod          [employer], PT, [employer] 07/24/22,11:24 AM

## 2022-07-25 ENCOUNTER — Inpatient Hospital Stay: Payer: 59

## 2022-07-25 DIAGNOSIS — I639 Cerebral infarction, unspecified: Secondary | ICD-10-CM | POA: Diagnosis not present

## 2022-07-25 DIAGNOSIS — N186 End stage renal disease: Secondary | ICD-10-CM | POA: Diagnosis not present

## 2022-07-25 DIAGNOSIS — Z7901 Long term (current) use of anticoagulants: Secondary | ICD-10-CM | POA: Diagnosis not present

## 2022-07-25 DIAGNOSIS — D6859 Other primary thrombophilia: Secondary | ICD-10-CM | POA: Diagnosis not present

## 2022-07-25 LAB — GLUCOSE, CAPILLARY
Glucose-Capillary: 69 mg/dL — ABNORMAL LOW (ref 70–99)
Glucose-Capillary: 106 mg/dL — ABNORMAL HIGH (ref 70–99)
Glucose-Capillary: 82 mg/dL (ref 70–99)
Glucose-Capillary: 119 mg/dL — ABNORMAL HIGH (ref 70–99)

## 2022-07-25 NOTE — Assessment & Plan Note (Signed)
MRI brain confirmed small acute CVA Neuro input appreciated Echo - wnl Plan for restarting Eliquis tomorrow per neuro as long as she doesn't worsen Carotid US pending - PT/OT/ST -SNF/long-term care at discharge.  Patient requires max assist with any movement and all ADLs.  She requires Hoyer lift for transfers - continue lipitor

## 2022-07-25 NOTE — Assessment & Plan Note (Signed)
History of multiple previous strokes.

## 2022-07-25 NOTE — Assessment & Plan Note (Signed)
Well compensated at this time.  Fluid mgmt with HD.

## 2022-07-25 NOTE — Assessment & Plan Note (Signed)
Continue coreg, torsemide.

## 2022-07-25 NOTE — Progress Notes (Signed)
PT Cancellation Note  Patient Details Name: Jody Nelson MRN: [id number] DOB: 1950/10/21   Cancelled Treatment:    Reason Eval/Treat Not Completed: Patient at procedure or test/unavailable Will re-attempt at later time/date.   [name] 07/25/2022, 10:20 AM

## 2022-07-25 NOTE — Assessment & Plan Note (Signed)
SSI for now 

## 2022-07-25 NOTE — Assessment & Plan Note (Signed)
Complicates overall prognosis. 

## 2022-07-25 NOTE — Progress Notes (Addendum)
  Progress Note   Patient: Jody Nelson [id number] DOB: [birth year] DOA: 07/22/2022     3 DOS: the patient was seen and examined on 07/25/2022   Brief hospital course: 72 y.o. female with medical history significant for hypercoagulable state with multiple cryptogenic CVAs,on Eliquis, ESRD on HD MWF, anemia of CKD, DMII , obesity with BMI over 50, OSA (not on CPAP), hypertension, HFpEF, biopsy-proven sarcoidosis, history of right tibial plateau fracture 2019 with hardware and chronic wound infection, chronic bilateral lymphedema hospitalized on month ago from 2/3 to 2/11 with cellulitis and abscess of the right leg s/p I&D and on wound VAC and discharged to rehab who presented to the ED as a code stroke after she was noted to be slurring her speech while at the dinner table.  She also had a new left arm weakness and had difficulty using her left arm to feed herself.   3/15: Neuro, Nephro, Palliative care c/s. HD today. MRI showed small acute infarct 3/16: Incomplete dialysis yesterday due to increased venous pressure so will get another session today to complete 3/17: Carotid Doppler pending  Assessment and Plan: * Acute CVA (cerebrovascular accident) [facility]) MRI brain confirmed small acute CVA Neuro input appreciated Echo - wnl Plan for restarting Eliquis tomorrow per neuro as long as she doesn't worsen Carotid US pending - PT/OT/ST -SNF/long-term care at discharge.  Patient requires max assist with any movement and all ADLs.  She requires Hoyer lift for transfers - continue lipitor   Hypercoagulable state (positive anticardiolipin, elevated homocysteine, factor V Leiden deficiency) Will need long-term anticoagulation.  Resume Eliquis tomorrow  Chronic anticoagulation Restart Eliquis tomorrow if no further clinical worsening per neuro.  ESRD on hemodialysis ([place]) HD per nephro.  Dialysis per nephrology  Slurred speech Due to acute CVA.  Improving.  Very mild dysarthria.  Pending carotid  Doppler  Right-sided weakness From previous stroke.  Morbid obesity with BMI of 50.0-59.9, adult ([place]) Complicates overall prognosis.  Essential hypertension Continue coreg, torsemide.  H/O: CVA (cerebrovascular accident) History of multiple previous strokes.  Diabetes mellitus with insulin therapy ([place]) SSI for now.  (HFpEF) heart failure with preserved ejection fraction ([place]) Well compensated at this time.  Fluid mgmt with HD.        Subjective: No new issues  Physical Exam: Vitals:   07/24/22 2013 07/24/22 2315 07/25/22 0845 07/25/22 1225  BP: 127/60 (!) 116/36 (!) 123/48 122/66  Pulse: (!) 58 87 72 70  Resp: 18 18 12 15   Temp: 98.6 F (37 C) 98 F (36.7 C) 97.8 F (36.6 C)   TempSrc: Oral Oral Oral Oral  SpO2: 100% 100% 100% 100%  Weight:      Height:       73 year old morbidly obese female lying in the bed comfortably without any acute distress Lungs clear to auscultation bilaterally Heart regular rate and rhythm Abdomen soft, benign, obese difficult exam Neuro alert and awake, mild dysarthria, left sensory deficit present in incoordination of left upper extremity bilateral lower extremity drift to bed Psych normal mood and affect Data Reviewed:  There are no new results to review at this time.  Family Communication: None at bedside  Disposition: Status is: Inpatient Remains inpatient appropriate because: Management of stroke, dialysis tomorrow.  Resuming Eliquis tomorrow.  Planned Discharge Destination: Skilled nursing facility   DVT prophylaxis-heparin Time spent: 35 minutes  Author: Max Sane, MD 07/25/2022 12:41 PM  For on call review [email or web address].

## 2022-07-25 NOTE — Plan of Care (Addendum)
Neurology plan of care  This is a 72 year old woman with a past medical history significant for multiple cryptogenic CVA secondary to hypercoagulable state on Eliquis, ESRD on HD Monday Wednesday Friday, anemia of CKD, diabetes type 2, obesity with BMI over 50, OSA not on CPAP, hypertension, heart failure with preserved ejection fraction, biopsy-proven sarcoidosis, chronic bilateral lymphedema hospitalized 1 month ago with cellulitis and abscess of the right leg status post I&D and on wound VAC discharged to rehab who presented to the ED as a code stroke for dysarthria and was found to have a small acute infarct in the R centrum semiovale.  Etiology of stroke favored to be 2/2 multiple known hypercoagulable states.  - Carotid US pending - Hold anticoagulation x3 days from symptom onset. Bridge with ASA 81mg  daily in the meantime. Restart anticoagulation at day 3 if exam is stable  - Will continue patient on eliquis as this is the best choice for her given her ESRD - Gently lower goal to normotension - q4 hr neuro checks - STAT head CT for any change in neuro exam - Tele - PT/OT/SLP - Stroke education - She may f/u with her established outpatient neurologist Dr. Melrose Nakayama upon discharge.  Neurology to f/u on carotid US, otherwise will be available for questions going forward.  Su Monks, MD [facility] [phone]  If 7pm- 7am, please page neurology on call as listed in [place].

## 2022-07-25 NOTE — Assessment & Plan Note (Signed)
HD per nephro.  Dialysis per nephrology

## 2022-07-25 NOTE — Assessment & Plan Note (Signed)
Restart Eliquis tomorrow if no further clinical worsening per neuro.

## 2022-07-25 NOTE — Assessment & Plan Note (Signed)
Will need long-term anticoagulation.  Resume Eliquis tomorrow

## 2022-07-25 NOTE — Progress Notes (Signed)
[facility]  ROUNDING NOTE   Subjective:   Jody Nelson is a 72 y.o. female with past medical history of obesity, diabetes, OSA, hypertension, CHF, anemia, and end stage renal disease on hemodialysis. Patient presents to the emergency dept from her SNF due to stroke symptoms. Patient was admitted for Slurred speech [R47.81] Left-sided weakness [R53.1] Acute CVA (cerebrovascular accident) [facility]) [I63.9]   Patient is known to our practice and receives outpatient dialysis treatments at [facility] on a MWF schedule, supervised by Dr Candiss Norse.   Patient sitting up in bed Stating she ate well for breakfast No pain or discomfort  Dialysis scheduled for tomorrow   Objective:  Vital signs in last 24 hours:  Temp:  [97.5 F (36.4 C)-98.6 F (37 C)] 97.8 F (36.6 C) (03/17 0845) Pulse Rate:  [58-87] 72 (03/17 0845) Resp:  [9-18] 12 (03/17 0845) BP: (105-140)/(36-87) 123/48 (03/17 0845) SpO2:  [98 %-100 %] 100 % (03/17 0845) Weight:  [132.9 kg-133.9 kg] 132.9 kg (03/16 1611)  Weight change: -1 kg Filed Weights   07/23/22 1847 07/24/22 1332 07/24/22 1611  Weight: 134.5 kg 133.9 kg 132.9 kg    Intake/Output: I/O last 3 completed shifts: In: 240 [P.O.:240] Out: 1050 [Urine:50; Other:1000]   Intake/Output this shift:  No intake/output data recorded.  Physical Exam: General: NAD  Head: Normocephalic, atraumatic. Moist oral mucosal membranes  Eyes: Anicteric  Lungs:  Clear to auscultation, normal effort.   Heart: Regular rate and rhythm  Abdomen:  Soft, nontender, obese  Extremities:  trace peripheral edema.   Neurologic: Alert, Left arm weakness  Skin: No lesions  Access: Lt AVG, Rt permcath    Basic Metabolic Panel: Recent Labs  Lab 07/22/22 2030 07/24/22 0316  NA 138 137  K 4.1 4.1  CL 101 101  CO2 28 30  GLUCOSE 178* 112*  BUN 18 15  CREATININE 4.75* 4.09*  CALCIUM 8.6* 8.4*     Liver Function Tests: Recent Labs  Lab 07/22/22 2030  AST  25  ALT 16  ALKPHOS 69  BILITOT 0.6  PROT 7.0  ALBUMIN 2.6*    No results for input(s): "LIPASE", "AMYLASE" in the last 168 hours. No results for input(s): "AMMONIA" in the last 168 hours.  CBC: Recent Labs  Lab 07/22/22 2030 07/24/22 0316  WBC 7.7 5.1  NEUTROABS 4.3  --   HGB 9.2* 8.8*  HCT 31.5* 29.3*  MCV 98.4 97.3  PLT 186 169     Cardiac Enzymes: No results for input(s): "CKTOTAL", "CKMB", "CKMBINDEX", "TROPONINI" in the last 168 hours.  BNP: Invalid input(s): "POCBNP"  CBG: Recent Labs  Lab 07/24/22 0801 07/24/22 1158 07/24/22 1706 07/24/22 2025 07/25/22 0845  GLUCAP 109* 159* 88 169* 70*     Microbiology: Results for orders placed or performed during the hospital encounter of 06/12/22  MRSA Next Gen by PCR, Nasal     Status: None   Collection Time: 06/13/22 12:45 AM   Specimen: Nasal Mucosa; Nasal Swab  Result Value Ref Range Status   MRSA by PCR Next Gen NOT DETECTED NOT DETECTED Final    Comment: (NOTE) The GeneXpert MRSA Assay (FDA approved for NASAL specimens only), is one component of a comprehensive MRSA colonization surveillance program. It is not intended to diagnose MRSA infection nor to guide or monitor treatment for MRSA infections. Test performance is not FDA approved in patients less than 9 years old. Performed at [facility], [address], [place], [place] [address]   Culture,  blood (Routine X 2) w Reflex to ID Panel     Status: None   Collection Time: 06/13/22  9:34 AM   Specimen: BLOOD  Result Value Ref Range Status   Specimen Description BLOOD BLOOD LEFT HAND  Final   Special Requests   Final    BOTTLES DRAWN AEROBIC AND ANAEROBIC Blood Culture adequate volume   Culture   Final    NO GROWTH 5 DAYS Performed at [facility], [address] [place], [place] [address]    Report Status 06/18/2022 FINAL  Final  Aerobic Culture w Gram Stain (superficial specimen)     Status: None   Collection Time:  06/13/22  9:35 AM   Specimen: Abscess  Result Value Ref Range Status   Specimen Description   Final    ABSCESS S T Performed at [facility], [address], [place], [place] [address]    Special Requests   Final    NONE Performed at [facility], [address] [place], [place] [address]    Gram Stain   Final    FEW GRAM POSITIVE COCCI IN SINGLES IN PAIRS RARE GRAM VARIABLE ROD NO WBC SEEN Performed at [facility], [place] [address] [place], [place] [address]    Culture   Final    FEW MORGANELLA MORGANII FEW ESCHERICHIA COLI FEW STAPHYLOCOCCUS AUREUS    Report Status 06/17/2022 FINAL  Final   Organism ID, Bacteria MORGANELLA MORGANII  Final   Organism ID, Bacteria ESCHERICHIA COLI  Final   Organism ID, Bacteria STAPHYLOCOCCUS AUREUS  Final      Susceptibility   Escherichia coli - MIC*    AMPICILLIN >=32 RESISTANT Resistant     CEFAZOLIN <=4 SENSITIVE Sensitive     CEFEPIME <=0.12 SENSITIVE Sensitive     CEFTAZIDIME <=1 SENSITIVE Sensitive     CEFTRIAXONE <=0.25 SENSITIVE Sensitive     CIPROFLOXACIN >=4 RESISTANT Resistant     GENTAMICIN <=1 SENSITIVE Sensitive     IMIPENEM <=0.25 SENSITIVE Sensitive     TRIMETH/SULFA >=320 RESISTANT Resistant     AMPICILLIN/SULBACTAM 16 INTERMEDIATE Intermediate     PIP/TAZO <=4 SENSITIVE Sensitive     * FEW ESCHERICHIA COLI   Morganella morganii - MIC*    AMPICILLIN >=32 RESISTANT Resistant     CEFAZOLIN RESISTANT Resistant     CEFTAZIDIME <=1 SENSITIVE Sensitive     CIPROFLOXACIN >=4 RESISTANT Resistant     GENTAMICIN <=1 SENSITIVE Sensitive     IMIPENEM 0.5 SENSITIVE Sensitive     TRIMETH/SULFA >=320 RESISTANT Resistant     AMPICILLIN/SULBACTAM 16 INTERMEDIATE Intermediate     PIP/TAZO <=4 SENSITIVE Sensitive     * FEW MORGANELLA MORGANII   Staphylococcus aureus - MIC*    CIPROFLOXACIN >=8 RESISTANT Resistant     ERYTHROMYCIN >=8 RESISTANT Resistant     GENTAMICIN <=0.5 SENSITIVE Sensitive      OXACILLIN 2 SENSITIVE Sensitive     TETRACYCLINE >=16 RESISTANT Resistant     VANCOMYCIN 1 SENSITIVE Sensitive     TRIMETH/SULFA >=320 RESISTANT Resistant     CLINDAMYCIN <=0.25 SENSITIVE Sensitive     RIFAMPIN <=0.5 SENSITIVE Sensitive     Inducible Clindamycin NEGATIVE Sensitive     * FEW STAPHYLOCOCCUS AUREUS  Culture, blood (Routine X 2) w Reflex to ID Panel     Status: None   Collection Time: 06/13/22  2:23 PM   Specimen: BLOOD  Result Value Ref Range Status   Specimen Description BLOOD BLOOD RIGHT FOREARM  Final   Special Requests   Final    BOTTLES DRAWN AEROBIC AND ANAEROBIC Blood Culture adequate volume   Culture   Final    NO GROWTH 5 DAYS Performed at [facility], [address] [place], [place] [address]    Report Status 06/18/2022 FINAL  Final  Aerobic/Anaerobic Culture w Gram Stain (surgical/deep wound)     Status: None   Collection Time: 06/15/22  2:37 PM   Specimen: Leg, Right; Wound  Result Value Ref Range Status   Specimen Description   Final    WOUND Performed at [facility], [address] [place], [place] [address]    Special Requests   Final    RIGHT LEG Performed at [facility], [address] [place], [place] [address]    Gram Stain   Final    FEW WBC PRESENT,BOTH PMN AND MONONUCLEAR RARE GRAM POSITIVE COCCI IN PAIRS    Culture   Final    RARE METHICILLIN RESISTANT STAPHYLOCOCCUS AUREUS RARE ESCHERICHIA COLI RARE GROUP B STREP(S.AGALACTIAE)ISOLATED TESTING AGAINST S. AGALACTIAE NOT ROUTINELY PERFORMED DUE TO PREDICTABILITY OF AMP/PEN/VAN SUSCEPTIBILITY. FEW MORGANELLA MORGANII NO ANAEROBES ISOLATED Performed at [facility], [place] [address], [place], [place] [address]    Report Status 06/21/2022 FINAL  Final   Organism ID, Bacteria ESCHERICHIA COLI  Final   Organism ID, Bacteria MORGANELLA MORGANII  Final   Organism ID, Bacteria METHICILLIN RESISTANT STAPHYLOCOCCUS AUREUS  Final      Susceptibility    Escherichia coli - MIC*    AMPICILLIN >=32 RESISTANT Resistant     CEFAZOLIN <=4 SENSITIVE Sensitive     CEFEPIME <=0.12 SENSITIVE Sensitive     CEFTAZIDIME <=1 SENSITIVE Sensitive     CEFTRIAXONE <=0.25 SENSITIVE Sensitive     CIPROFLOXACIN >=4 RESISTANT Resistant     GENTAMICIN <=1 SENSITIVE Sensitive     IMIPENEM <=0.25 SENSITIVE Sensitive     TRIMETH/SULFA >=320 RESISTANT Resistant     AMPICILLIN/SULBACTAM 16 INTERMEDIATE Intermediate     PIP/TAZO <=4 SENSITIVE Sensitive     * RARE ESCHERICHIA COLI   Morganella morganii - MIC*    AMPICILLIN >=32 RESISTANT Resistant     CEFAZOLIN >=64 RESISTANT Resistant     CEFTAZIDIME <=1 SENSITIVE Sensitive     CIPROFLOXACIN >=4 RESISTANT Resistant     GENTAMICIN <=1 SENSITIVE Sensitive     IMIPENEM 2 SENSITIVE Sensitive     TRIMETH/SULFA >=320 RESISTANT Resistant     AMPICILLIN/SULBACTAM >=32 RESISTANT Resistant     PIP/TAZO <=4 SENSITIVE Sensitive     * FEW MORGANELLA MORGANII   Methicillin resistant staphylococcus aureus - MIC*    CIPROFLOXACIN >=8 RESISTANT Resistant     ERYTHROMYCIN >=8 RESISTANT Resistant     GENTAMICIN <=0.5 SENSITIVE Sensitive     OXACILLIN >=4 RESISTANT Resistant     TETRACYCLINE >=16 RESISTANT Resistant     VANCOMYCIN 1 SENSITIVE Sensitive     TRIMETH/SULFA >=320 RESISTANT Resistant     CLINDAMYCIN <=0.25 SENSITIVE Sensitive     RIFAMPIN <=0.5 SENSITIVE Sensitive     Inducible Clindamycin NEGATIVE Sensitive     * RARE METHICILLIN RESISTANT STAPHYLOCOCCUS AUREUS    Coagulation Studies: Recent Labs    07/22/22 2030  LABPROT 14.9  INR 1.2     Urinalysis: No results for input(s): "COLORURINE", "LABSPEC", "PHURINE", "GLUCOSEU", "HGBUR", "BILIRUBINUR", "KETONESUR", "PROTEINUR", "UROBILINOGEN", "NITRITE", "LEUKOCYTESUR" in the last 72 hours.  Invalid input(s): "APPERANCEUR"    Imaging: ECHOCARDIOGRAM COMPLETE  Result Date: 07/23/2022  ECHOCARDIOGRAM REPORT   Patient Name:   Jody Nelson Date of Exam:  07/23/2022 Medical Rec #:  [id number]   Height:       64.0 in Accession #:    [id number]  Weight:       297.4 lb Date of Birth:  [birth year]   BSA:          2.316 m Patient Age:    89 years    BP:           131/47 mmHg Patient Gender: F           HR:           83 bpm. Exam Location:  [facility] Procedure: 2D Echo, Cardiac Doppler and Color Doppler Indications:     Stroke  History:         Patient has no prior history of Echocardiogram examinations.                  CHF, Stroke, Signs/Symptoms:Chest Pain and Dyspnea; Risk                  Factors:Hypertension, Diabetes, Dyslipidemia and Sleep Apnea.  Sonographer:     Wenda Low Referring Phys:  [id number] Athena Masse Diagnosing Phys: Ida Rogue MD  Sonographer Comments: Technically difficult study due to poor echo windows and patient is obese. Image acquisition challenging due to respiratory motion. IMPRESSIONS  1. Left ventricular ejection fraction, by estimation, is 60 to 65%. The left ventricle has normal function. The left ventricle has no regional wall motion abnormalities. There is mild left ventricular hypertrophy. Left ventricular diastolic parameters are consistent with Grade I diastolic dysfunction (impaired relaxation).  2. Right ventricular systolic function is normal. The right ventricular size is normal.  3. The mitral valve is normal in structure. No evidence of mitral valve regurgitation. No evidence of mitral stenosis.  4. The aortic valve has an indeterminant number of cusps. Aortic valve regurgitation is not visualized. Aortic valve sclerosis/calcification is present, without any evidence of aortic stenosis. Aortic valve mean gradient measures 5.0 mmHg.  5. The inferior vena cava is normal in size with greater than 50% respiratory variability, suggesting right atrial pressure of 3 mmHg. FINDINGS  Left Ventricle: Left ventricular ejection fraction, by estimation, is 60 to 65%. The left ventricle has normal function. The left ventricle has no  regional wall motion abnormalities. The left ventricular internal cavity size was normal in size. There is  mild left ventricular hypertrophy. Left ventricular diastolic parameters are consistent with Grade I diastolic dysfunction (impaired relaxation). Right Ventricle: The right ventricular size is normal. No increase in right ventricular wall thickness. Right ventricular systolic function is normal. Left Atrium: Left atrial size was normal in size. Right Atrium: Right atrial size was normal in size. Pericardium: Trivial pericardial effusion is present. Mitral Valve: The mitral valve is normal in structure. No evidence of mitral valve regurgitation. No evidence of mitral valve stenosis. MV peak gradient, 5.6 mmHg. The mean mitral valve gradient is 2.0 mmHg. Tricuspid Valve: The tricuspid valve is normal in structure. Tricuspid valve regurgitation is trivial. No evidence of tricuspid stenosis. Aortic Valve: The aortic valve has an indeterminant number of cusps. Aortic valve regurgitation is not visualized. Aortic valve sclerosis/calcification is present, without any evidence of aortic stenosis. Aortic valve mean gradient measures 5.0 mmHg. Aortic valve peak gradient measures 9.4 mmHg. Aortic valve area, by VTI measures 2.35 cm. Pulmonic Valve: The pulmonic valve was normal in structure. Pulmonic valve regurgitation is  not visualized. No evidence of pulmonic stenosis. Aorta: The aortic root is normal in size and structure. Venous: The inferior vena cava is normal in size with greater than 50% respiratory variability, suggesting right atrial pressure of 3 mmHg. IAS/Shunts: No atrial level shunt detected by color flow Doppler.  LEFT VENTRICLE PLAX 2D LVIDd:         3.70 cm   Diastology LVIDs:         2.30 cm   LV e' medial:    8.27 cm/s LV PW:         1.10 cm   LV E/e' medial:  12.7 LV IVS:        1.20 cm   LV e' lateral:   10.60 cm/s LVOT diam:     2.00 cm   LV E/e' lateral: 9.9 LV SV:         83 LV SV Index:   36  LVOT Area:     3.14 cm  RIGHT VENTRICLE RV Basal diam:  4.10 cm RV Mid diam:    3.30 cm RV S prime:     17.20 cm/s TAPSE (M-mode): 2.4 cm LEFT ATRIUM             Index        RIGHT ATRIUM           Index LA diam:        3.20 cm 1.38 cm/m   RA Area:     18.40 cm LA Vol (A2C):   72.1 ml 31.14 ml/m  RA Volume:   59.20 ml  25.56 ml/m LA Vol (A4C):   75.1 ml 32.43 ml/m LA Biplane Vol: 78.9 ml 34.07 ml/m  AORTIC VALVE                     PULMONIC VALVE AV Area (Vmax):    2.28 cm      PV Vmax:       1.05 m/s AV Area (Vmean):   2.22 cm      PV Peak grad:  4.4 mmHg AV Area (VTI):     2.35 cm AV Vmax:           153.00 cm/s AV Vmean:          106.000 cm/s AV VTI:            0.355 m AV Peak Grad:      9.4 mmHg AV Mean Grad:      5.0 mmHg LVOT Vmax:         111.00 cm/s LVOT Vmean:        74.900 cm/s LVOT VTI:          0.265 m LVOT/AV VTI ratio: 0.75  AORTA Ao Root diam: 2.80 cm MITRAL VALVE MV Area (PHT): 3.23 cm     SHUNTS MV Area VTI:   3.00 cm     Systemic VTI:  0.26 m MV Peak grad:  5.6 mmHg     Systemic Diam: 2.00 cm MV Mean grad:  2.0 mmHg MV Vmax:       1.18 m/s MV Vmean:      71.0 cm/s MV Decel Time: 235 msec MV E velocity: 105.00 cm/s MV A velocity: 85.90 cm/s MV E/A ratio:  1.22 Ida Rogue MD Electronically signed by Ida Rogue MD Signature Date/Time: 07/23/2022/4:20:27 PM    Final      Medications:      aspirin EC  325 mg Oral Daily   atorvastatin  80 mg Oral Daily   calcitRIOL  0.25 mcg Oral Daily   calcium acetate  667 mg Oral TID WC   carvedilol  25 mg Oral BID WC   Chlorhexidine Gluconate Cloth  6 each Topical Q0600   docusate sodium  100 mg Oral BID   gabapentin  200 mg Oral BID   heparin  5,000 Units Subcutaneous Q8H   insulin aspart  0-5 Units Subcutaneous QHS   insulin aspart  0-6 Units Subcutaneous TID WC   insulin glargine-yfgn  10 Units Subcutaneous QHS   latanoprost  1 drop Both Eyes QHS   multivitamin  1 tablet Oral BID   polyethylene glycol  17 g Oral BID    torsemide  10 mg Oral Daily   acetaminophen **OR** acetaminophen (TYLENOL) oral liquid 160 mg/5 mL **OR** acetaminophen, ipratropium-albuterol, traZODone  Assessment/ Plan:  Jody Nelson is a 72 y.o.  female with past medical history of obesity, diabetes, OSA, hypertension, CHF, anemia, and end stage renal disease on hemodialysis. Patient presents to the emergency dept from her SNF due to stroke symptoms. Patient was admitted for Slurred speech [R47.81] Left-sided weakness [R53.1] Acute CVA (cerebrovascular accident) ([place]) [I63.9]  [facility]/MWF/LT AVG/Rt permcth/134kg  End stage renal disease on hemodialysis. Patient received extra dialysis treatment yesterday due to partial treatment received on Friday. Treatment completed as 1-1 with her access.  UF 1 L achieved. Next treatment scheduled for Monday. Renal navigator aware of patient and monitoring discharge plan.  If patient does relocate to SNF closer to [facility], she will require outpatient dialysis clinic transfer.  2.Anemia of chronic kidney disease Lab Results  Component Value Date   HGB 8.8 (L) 07/24/2022    Hemoglobin stable at this time.  Will continue to monitor for ESA's.  3. Secondary Hyperparathyroidism: with outpatient labs: PTH 463, phosphorus 6.8, calcium 8.4 on 05/17/22    Lab Results  Component Value Date   CALCIUM 8.4 (L) 07/24/2022   CAION 1.13 (L) 04/16/2022   PHOS 4.5 06/21/2022  Calcium and phosphorus within optimal range. Continue calcium acetate and calcitriol with meals.   4. Diabetes mellitus type II with chronic kidney disease/renal manifestations: insulin dependent. Home regimen includes Lantus.   Hypoglycemic this morning to 69.  Primary team to manage sliding scale insulin   LOS: 3 Karne Ozga 3/17/202411:07 AM

## 2022-07-25 NOTE — Progress Notes (Signed)
Physical Therapy Treatment Patient Details Name: Jody Nelson MRN: [id number] DOB: 1950/12/24 Today's Date: 07/25/2022   History of Present Illness Pt is a 72 year old female admitted with acute CVA right centrum semiovale and numerous chronic microhemorrhages; PMH significant for hypercoagulable state with multiple cryptogenic CVAs,on Eliquis, ESRD on HD MWF, anemia of CKD, DMII , obesity with BMI over 50, OSA (not on CPAP), hypertension, HFpEF, biopsy-proven sarcoidosis, history of right tibial plateau fracture 2019 with hardware and chronic wound infection, chronic bilateral lymphedema hospitalized on month ago from 2/3 to 2/11 with cellulitis and abscess of the right leg s/p I&D and wound VAC    PT Comments    Patient received in bed, reports she was at CT this am. She is agreeable to PT session. Patient requires min a for supine to sit. Min A for sit to stand from slightly elevated bed. Patient is able to take a few side steps at edge of bed with RW and min A. She performed sit to stand x 2 more reps for cleaning up and changing sheets. Patient is easily fatigued. Patient will continue to benefit from skilled PT to improve strength, endurance and independence.       Recommendations for follow up therapy are one component of a multi-disciplinary discharge planning process, led by the attending physician.  Recommendations may be updated based on patient status, additional functional criteria and insurance authorization.  Follow Up Recommendations  Skilled nursing-short term rehab (<3 hours/day) Can patient physically be transported by private vehicle: No   Assistance Recommended at Discharge Frequent or constant Supervision/Assistance  Patient can return home with the following A lot of help with walking and/or transfers;A lot of help with bathing/dressing/bathroom   Equipment Recommendations  None recommended by PT    Recommendations for Other Services       Precautions / Restrictions  Precautions Precautions: Fall Restrictions Weight Bearing Restrictions: No     Mobility  Bed Mobility Overal bed mobility: Needs Assistance Bed Mobility: Supine to Sit, Sit to Supine     Supine to sit: Min assist Sit to supine: Mod assist   General bed mobility comments: assist for BLE return to bed, trunk exiting bed    Transfers Overall transfer level: Needs assistance Equipment used: Rolling walker (2 wheels) Transfers: Sit to/from Stand Sit to Stand: Min assist, From elevated surface           General transfer comment: Stood x 3 from bed at slightly elevated level    Ambulation/Gait Ambulation/Gait assistance: Min guard Gait Distance (Feet): 3 Feet Assistive device: Rolling walker (2 wheels) Gait Pattern/deviations: Step-to pattern, Decreased step length - right, Decreased step length - left Gait velocity: decr     General Gait Details: side steps at edge of bed   Stairs             Wheelchair Mobility    Modified Rankin (Stroke Patients Only)       Balance Overall balance assessment: Needs assistance Sitting-balance support: Feet supported Sitting balance-Leahy Scale: Good     Standing balance support: Bilateral upper extremity supported, During functional activity Standing balance-Leahy Scale: Fair Standing balance comment: poor standing tolerance due to fatigue                            Cognition Arousal/Alertness: Awake/alert Behavior During Therapy: WFL for tasks assessed/performed Overall Cognitive Status: Within Functional Limits for tasks assessed  Following Commands: Follows one step commands with increased time       General Comments: good safety awareness and recall of proper transfer technqiue        Exercises      General Comments        Pertinent Vitals/Pain Pain Assessment Pain Assessment: Faces Faces Pain Scale: Hurts little more Pain Location: bottom with  cleaning Pain Descriptors / Indicators: Discomfort, Sore Pain Intervention(s): Monitored during session    Home Living                          Prior Function            PT Goals (current goals can now be found in the care plan section) Acute Rehab PT Goals Patient Stated Goal: to return to SNF and work with therapy. She wants to be able to walk. PT Goal Formulation: With patient Time For Goal Achievement: 08/07/22 Potential to Achieve Goals: Fair Progress towards PT goals: Progressing toward goals    Frequency    7X/week      PT Plan Current plan remains appropriate    Co-evaluation              AM-PAC PT "6 Clicks" Mobility   Outcome Measure  Help needed turning from your back to your side while in a flat bed without using bedrails?: A Lot Help needed moving from lying on your back to sitting on the side of a flat bed without using bedrails?: A Lot Help needed moving to and from a bed to a chair (including a wheelchair)?: A Lot Help needed standing up from a chair using your arms (e.g., wheelchair or bedside chair)?: A Little Help needed to walk in hospital room?: A Lot Help needed climbing 3-5 steps with a railing? : Total 6 Click Score: 12    End of Session   Activity Tolerance: Patient limited by fatigue Patient left: in bed;with call bell/phone within reach;with nursing/sitter in room Nurse Communication: Mobility status PT Visit Diagnosis: Other abnormalities of gait and mobility (R26.89);Muscle weakness (generalized) (M62.81);Difficulty in walking, not elsewhere classified (R26.2);History of falling (Z91.81)     Time: [id number] PT Time Calculation (min) (ACUTE ONLY): 26 min  Charges:  $Therapeutic Activity: 23-37 mins                     Cameka Rae, PT, GCS 07/25/22,2:16 PM

## 2022-07-25 NOTE — Assessment & Plan Note (Signed)
From previous stroke.

## 2022-07-25 NOTE — Assessment & Plan Note (Signed)
Due to acute CVA.  Improving.  Very mild dysarthria.  Pending carotid Doppler

## 2022-07-26 DIAGNOSIS — I639 Cerebral infarction, unspecified: Secondary | ICD-10-CM | POA: Diagnosis not present

## 2022-07-26 DIAGNOSIS — Z7901 Long term (current) use of anticoagulants: Secondary | ICD-10-CM | POA: Diagnosis not present

## 2022-07-26 DIAGNOSIS — Z8673 Personal history of transient ischemic attack (TIA), and cerebral infarction without residual deficits: Secondary | ICD-10-CM

## 2022-07-26 DIAGNOSIS — D6859 Other primary thrombophilia: Secondary | ICD-10-CM | POA: Diagnosis not present

## 2022-07-26 DIAGNOSIS — R531 Weakness: Secondary | ICD-10-CM | POA: Diagnosis not present

## 2022-07-26 DIAGNOSIS — Z515 Encounter for palliative care: Secondary | ICD-10-CM | POA: Diagnosis not present

## 2022-07-26 DIAGNOSIS — R4781 Slurred speech: Secondary | ICD-10-CM | POA: Diagnosis not present

## 2022-07-26 LAB — RENAL FUNCTION PANEL
Albumin: 2.2 g/dL — ABNORMAL LOW (ref 3.5–5.0)
Anion gap: 7 (ref 5–15)
BUN: 22 mg/dL (ref 8–23)
CO2: 29 mmol/L (ref 22–32)
Calcium: 8.7 mg/dL — ABNORMAL LOW (ref 8.9–10.3)
Chloride: 99 mmol/L (ref 98–111)
Creatinine, Ser: 4.38 mg/dL — ABNORMAL HIGH (ref 0.44–1.00)
GFR, Estimated: 10 mL/min — ABNORMAL LOW (ref >60)
Glucose, Bld: 160 mg/dL — ABNORMAL HIGH (ref 70–99)
Phosphorus: 3.6 mg/dL (ref 2.5–4.6)
Potassium: 5.3 mmol/L — ABNORMAL HIGH (ref 3.5–5.1)
Sodium: 135 mmol/L (ref 135–145)

## 2022-07-26 LAB — CBC
HCT: 30.5 % — ABNORMAL LOW (ref 36.0–46.0)
Hemoglobin: 8.8 g/dL — ABNORMAL LOW (ref 12.0–15.0)
MCH: 28.7 pg (ref 26.0–34.0)
MCHC: 28.9 g/dL — ABNORMAL LOW (ref 30.0–36.0)
MCV: 99.3 fL (ref 80.0–100.0)
Platelets: 208 10*3/uL (ref 150–400)
RBC: 3.07 MIL/uL — ABNORMAL LOW (ref 3.87–5.11)
RDW: 16.8 % — ABNORMAL HIGH (ref 11.5–15.5)
WBC: 6.6 10*3/uL (ref 4.0–10.5)
nRBC: 0 % (ref 0.0–0.2)

## 2022-07-26 LAB — GLUCOSE, CAPILLARY
Glucose-Capillary: 81 mg/dL (ref 70–99)
Glucose-Capillary: 118 mg/dL — ABNORMAL HIGH (ref 70–99)
Glucose-Capillary: 119 mg/dL — ABNORMAL HIGH (ref 70–99)
Glucose-Capillary: 154 mg/dL — ABNORMAL HIGH (ref 70–99)

## 2022-07-26 MED ORDER — HEPARIN SODIUM (PORCINE) 1000 UNIT/ML IJ SOLN
INTRAMUSCULAR | Status: AC
  Administered 2022-07-26: 2200 [IU]
  Filled 2022-07-26: qty 3

## 2022-07-26 MED ORDER — HYDROCORTISONE 1 % EX CREA: TOPICAL_CREAM | Freq: Two times a day (BID) | CUTANEOUS | Status: DC | PRN

## 2022-07-26 MED ORDER — APIXABAN 5 MG PO TABS
5.0000 mg | ORAL_TABLET | Freq: Two times a day (BID) | ORAL | Status: DC
  Administered 2022-07-26 (×2): 5 mg via ORAL
  Filled 2022-07-26 (×2): qty 1

## 2022-07-26 MED ORDER — HYDROCORTISONE 1 % EX CREA
TOPICAL_CREAM | Freq: Two times a day (BID) | CUTANEOUS | Status: DC
  Filled 2022-07-26: qty 28

## 2022-07-26 MED ORDER — HEPARIN SODIUM (PORCINE) 1000 UNIT/ML IJ SOLN: 2.2000 mL | Freq: Once | INTRAMUSCULAR | Status: DC

## 2022-07-26 MED ORDER — HEPARIN SODIUM (PORCINE) 1000 UNIT/ML IJ SOLN: 1000.0000 [IU] | INTRAMUSCULAR | Status: DC | PRN

## 2022-07-26 NOTE — Assessment & Plan Note (Signed)
Resuming Eliquis today

## 2022-07-26 NOTE — Assessment & Plan Note (Signed)
Well compensated at this time.  Fluid mgmt with HD.

## 2022-07-26 NOTE — Care Management Important Message (Signed)
Important Message  Patient Details  Name: Jody Nelson MRN: [id number] Date of Birth: 1950-12-10   Medicare Important Message Given:  Yes     Dannette Barbara 07/26/2022, 1:38 PM

## 2022-07-26 NOTE — Assessment & Plan Note (Signed)
HD per nephro.  Dialysis per nephrology.  Will be getting HD today

## 2022-07-26 NOTE — Progress Notes (Signed)
Physical Therapy Treatment Patient Details Name: Jody Nelson MRN: [id number] DOB: 1950-09-24 Today's Date: 07/26/2022   History of Present Illness Pt is a 72 year old female admitted with acute CVA right centrum semiovale and numerous chronic microhemorrhages; PMH significant for hypercoagulable state with multiple cryptogenic CVAs,on Eliquis, ESRD on HD MWF, anemia of CKD, DMII , obesity with BMI over 50, OSA (not on CPAP), hypertension, HFpEF, biopsy-proven sarcoidosis, history of right tibial plateau fracture 2019 with hardware and chronic wound infection, chronic bilateral lymphedema hospitalized on month ago from 2/3 to 2/11 with cellulitis and abscess of the right leg s/p I&D and wound VAC    PT Comments    Pt received upright in bed agreeable to PT services. Pt motivated to participate and improve her function.  Pt remains needing assist with bed mobility with bed features and minA at trunk. Pt still relies on elevated surface to stand but performs x2 STS without physical support today only needing minguard for safety as pt needs education and cuing for anterior weight shift as pt's feet attempt to slide anteriorly with the bed at lowered surface increasing risk of sliding onto floor at EOB. On first effort pt relies greatly on posterior aspects of LE's against bed to stand with limited gluteal clearance as when author lowers bed she loses balance posteriorly requiring seated rest. On second attempt after PT education and demo pt greatly improves upright standing without needing posterior aspects of LE's on bed and is able to side step to the R ~4 ' to [facility] although it is very effortful at minguard level. Pt visibly fatigued requesting to return back to bed where she is able to utilize BUE's on HOB to boost her self up. All needs in reach. Pt making steady progress towards PLOF os SPT's but still remains too weak to attempt SPT this date without second person for safety due to limited standing  tolerance. Continue to recommend additional PT services at d/c to address deficits in LE weakness.    Recommendations for follow up therapy are one component of a multi-disciplinary discharge planning process, led by the attending physician.  Recommendations may be updated based on patient status, additional functional criteria and insurance authorization.  Follow Up Recommendations  Skilled nursing-short term rehab (<3 hours/day) Can patient physically be transported by private vehicle: No   Assistance Recommended at Discharge Frequent or constant Supervision/Assistance  Patient can return home with the following A lot of help with walking and/or transfers;A lot of help with bathing/dressing/bathroom   Equipment Recommendations  None recommended by PT    Recommendations for Other Services       Precautions / Restrictions Precautions Precautions: Fall Restrictions Weight Bearing Restrictions: No     Mobility  Bed Mobility Overal bed mobility: Needs Assistance Bed Mobility: Supine to Sit, Sit to Supine     Supine to sit: Min assist, HOB elevated Sit to supine: Mod assist   General bed mobility comments: assist for BLE return to bed, trunk exiting bed Patient Response: Cooperative  Transfers Overall transfer level: Needs assistance Equipment used: Rolling walker (2 wheels) Transfers: Sit to/from Stand Sit to Stand: Min guard, From elevated surface           General transfer comment: no physical assist but relies on elevated surface to prevent anterior slidding of feet    Ambulation/Gait Ambulation/Gait assistance: Min guard Gait Distance (Feet): 4 Feet Assistive device: Rolling walker (2 wheels) Gait Pattern/deviations: Step-to pattern, Decreased step length - right, Decreased  step length - left       General Gait Details: side steps at edge of bed   Stairs             Wheelchair Mobility    Modified Rankin (Stroke Patients Only)       Balance  Overall balance assessment: Needs assistance Sitting-balance support: Feet supported Sitting balance-Leahy Scale: Good     Standing balance support: Bilateral upper extremity supported, During functional activity Standing balance-Leahy Scale: Fair Standing balance comment: relies on RW                            Cognition Arousal/Alertness: Awake/alert Behavior During Therapy: WFL for tasks assessed/performed Overall Cognitive Status: Within Functional Limits for tasks assessed                                 General Comments: Highly motivated        Exercises      General Comments        Pertinent Vitals/Pain Pain Assessment Pain Assessment: Faces Faces Pain Scale: Hurts a little bit Pain Location: LE's Pain Descriptors / Indicators: Grimacing Pain Intervention(s): Monitored during session, Repositioned    Home Living                          Prior Function            PT Goals (current goals can now be found in the care plan section) Acute Rehab PT Goals Patient Stated Goal: to return to SNF and work with therapy. She wants to be able to walk. PT Goal Formulation: With patient Time For Goal Achievement: 08/07/22 Potential to Achieve Goals: Fair Progress towards PT goals: Progressing toward goals    Frequency    7X/week      PT Plan Current plan remains appropriate    Co-evaluation              AM-PAC PT "6 Clicks" Mobility   Outcome Measure  Help needed turning from your back to your side while in a flat bed without using bedrails?: A Lot Help needed moving from lying on your back to sitting on the side of a flat bed without using bedrails?: A Lot Help needed moving to and from a bed to a chair (including a wheelchair)?: A Lot   Help needed to walk in hospital room?: A Lot Help needed climbing 3-5 steps with a railing? : Total 6 Click Score: 9    End of Session Equipment Utilized During Treatment: Gait  belt Activity Tolerance: Patient tolerated treatment well;Patient limited by fatigue Patient left: in bed;with call bell/phone within reach;with nursing/sitter in room Nurse Communication: Mobility status PT Visit Diagnosis: Other abnormalities of gait and mobility (R26.89);Muscle weakness (generalized) (M62.81);Difficulty in walking, not elsewhere classified (R26.2);History of falling (Z91.81)     Time: [id number] PT Time Calculation (min) (ACUTE ONLY): 19 min  Charges:  $Therapeutic Exercise: 8-22 mins                     Salem Caster. Fairly IV, PT, DPT Physical Therapist- [facility]  07/26/2022, 10:28 AM

## 2022-07-26 NOTE — Assessment & Plan Note (Signed)
SSI for now 

## 2022-07-26 NOTE — Assessment & Plan Note (Signed)
Will need long-term anticoagulation.  Resume Eliquis today

## 2022-07-26 NOTE — Assessment & Plan Note (Signed)
Complicates overall prognosis. 

## 2022-07-26 NOTE — Assessment & Plan Note (Signed)
History of multiple previous strokes.

## 2022-07-26 NOTE — Progress Notes (Signed)
ANTICOAGULATION CONSULT NOTE - Initial Consult  Pharmacy Consult for Eliquis  Indication: recurrent cryptogenic CVA prophylaxis   Allergies  Allergen Reactions   Sulfamethoxazole-Trimethoprim     Other reaction(s): Kidney Disorder Other reaction(s): Kidney Disorder Hyperkalemia, AKI Hyperkalemia, AKI     Patient Measurements: Height: 5\' 4"  (162.6 cm) Weight: 132.9 kg (292 lb 15.9 oz) IBW/kg (Calculated) : 54.7 Heparin Dosing Weight:   Vital Signs: Temp: 97.7 F (36.5 C) (03/18 0348) Temp Source: Oral (03/18 0348) BP: 128/39 (03/18 0348) Pulse Rate: 79 (03/18 0348)  Labs: Recent Labs    07/24/22 0316  HGB 8.8*  HCT 29.3*  PLT 169  CREATININE 4.09*    Estimated Creatinine Clearance: 17.4 mL/min (A) (by C-G formula based on SCr of 4.09 mg/dL (H)).   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   At high risk for falls    Atypical chest pain    Bilateral lower extremity edema    Chronic right sided weakness    Cryptogenic stroke ([place]) 02/27/2011   a.) MRI brain 02/27/2011: old lacunar infarct in RIGHT pons and medulla oblongata   Cryptogenic stroke ([place]) 10/11/2012   a.) CT brain 10/11/2012: tiny old lacunar infarct in head of caudate nucleus (left)   Cryptogenic stroke ([place]) 10/12/2012   a.) MRI brain 10/12/2012: multiple acute LEFT hemispheric infarcts   Cryptogenic stroke ([place]) 06/26/2014   a.) MRI brain 06/26/2014: acute RIGHT posterior frontal lobe infarct involving both and grey matter   Cryptogenic stroke  ([place]) 07/29/2015   a.) MRI brain 07/29/2015: 2 foci of diffusion restriction in the LEFT parietal lobe consistent with acute infarction   Cryptogenic stroke ([place]) 07/24/2017   a.) MRI brain 07/24/2017: punctate focus of acute ischemia in the medial LEFT temporal lobe   DDD (degenerative disc disease), cervical    Depression    DOE (dyspnea on exertion)    Dysarthria as late effect of stroke    ESRD (end stage renal disease) on dialysis ([place])    a.) M-W-F   GERD (gastroesophageal reflux disease)    Hallux valgus, bilateral    Heterozygous factor V Leiden mutation ([place]) 07/18/2018   History of cardiac catheterization 08/30/2008   a.) LHC 08/30/2008: EF >60%, LVEDP 18 mmHg, normal coronaries.   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   a.) 07/18/2018 --> 22 mol/L   Hypertension    Insomnia    a.) melatonin + trazodone PRN   Long term current use of anticoagulant    a.) apixaban   Lymphedema of both lower extremities    Meralgia paresthetica, left lower limb    OSA (obstructive sleep apnea)    a.) does not require nocturnal PAP therapy   Osteoarthritis    Pneumonia    Pseudophakia of both eyes    Sarcoidosis    Secondary hyperparathyroidism  of renal origin ([place])    Type 2 diabetes mellitus treated with insulin (HCC)    Vitamin D deficiency     Medications:  Medications Prior to Admission  Medication Sig Dispense Refill Last Dose   acetaminophen (TYLENOL) 325 MG tablet Take 2 tablets by mouth every 8 (eight) hours as needed.   prn at unk   ACIDOPHILUS LACTOBACILLUS PO Take 1 capsule by mouth 3 (three) times daily.   07/22/2022 at 1300   calcitRIOL (ROCALTROL) 0.25 MCG capsule Take 0.25 mcg by mouth daily.   07/22/2022 at 0800    calcium acetate (PHOSLO) 667 MG capsule Take one capsule by mouth twice daily with food on Mondays, Wednesdays, and Fridays. Take one capsule 3 times daily on Tuesdays, Thursdays, Saturdays, and Sundays.   07/22/2022 at 1200   Calcium Carb-Cholecalciferol (OYSTER SHELL CALCIUM W/D) 500-5 MG-MCG TABS Take 1 tablet by mouth daily as needed.   07/22/2022 at 0800   carvedilol (COREG) 25 MG tablet Take 1 tablet by mouth 2 (two) times daily with a meal.   07/22/2022 at 0800   diclofenac Sodium (VOLTAREN) 1 % GEL Apply topically.   prn at unk   docusate sodium (COLACE) 100 MG capsule Take 100 mg by mouth 2 (two) times daily.   07/22/2022 at 0800   ferrous sulfate 324 (65 Fe) MG TBEC Take 1 tablet by mouth daily.   [id number]   folic acid (FOLVITE) 1 MG tablet Take 1 mg by mouth daily.   07/22/2022 at 0800   gabapentin (NEURONTIN) 100 MG capsule Take 2 capsules by mouth 2 (two) times daily.   07/22/2022 at 0800   HYDROcodone-acetaminophen (NORCO/VICODIN) 5-325 MG tablet Take 1 tablet by mouth every 8 (eight) hours as needed for moderate pain. 12 tablet 0 prn at unk   insulin lispro (HUMALOG) 100 UNIT/ML injection Inject 2-12 Units into the skin in the morning, at noon, and at bedtime. Per sliding scale   07/22/2022 at 1200   LIDOCAINE PAIN RELIEF 4 % 1 patch daily. 12 hours on, 12 hours off.   07/22/2022 at 0800   metroNIDAZOLE (METROGEL) 1 % gel Apply topically.   prn at unk   Multiple Vitamins-Minerals (PRESERVISION AREDS) TABS Take 250 mg by mouth 2 (two) times daily.   07/22/2022 at 0800   omeprazole (PRILOSEC) 10 MG capsule Take 10 mg by mouth daily.   07/22/2022 at 0600   oxyCODONE (OXY IR/ROXICODONE) 5 MG immediate release tablet Take 5 mg by mouth at bedtime.   07/21/2022 at 2000   polyethylene glycol (MIRALAX / GLYCOLAX) 17 g packet Take 17 g by mouth daily.   prn at unk   torsemide (DEMADEX) 10 MG tablet Take 10 mg by mouth daily.   07/22/2022 at 0800   atorvastatin (LIPITOR) 80 MG tablet Take 80 mg  by mouth daily.   07/21/2022 at 1700   ceFEPime (MAXIPIME) IVPB Inject 2 g into the vein every Monday, Wednesday, and Friday with hemodialysis. Cefepime 2gm IV after hemodialysis on MWF Indication:  polymicrobial left leg infection Last Day of Therapy:  06/28/2022 To be given at HD center (Patient not taking: Reported on 07/23/2022) 5 Units 0 Not Taking   insulin glargine (LANTUS) 100 UNIT/ML Solostar Pen Inject 10 Units into the skin at bedtime.   07/21/2022 at 2000   ipratropium-albuterol (DUONEB) 0.5-2.5 (3) MG/3ML SOLN Take by nebulization. (Patient not taking: Reported on 06/12/2022)   Not Taking   latanoprost (XALATAN) 0.005 % ophthalmic solution Place  1 drop into both eyes at bedtime.   07/21/2022 at 2000   montelukast (SINGULAIR) 10 MG tablet Take 10 mg by mouth daily.   07/21/2022 at 1600   traZODone (DESYREL) 50 MG tablet Take 1 tablet by mouth at bedtime as needed. (Patient not taking: Reported on 07/23/2022)   Not Taking    Assessment: Pharmacy consulted to dose Eliquis for stroke prophylaxis in this 72 year old female on HD outpt.  Was on heparin 5000 units SQ TID, last dose on 3/18 @ 0451.  TBW = 132.9 kg   Goal of Therapy:  Prevention of stroke   Plan:  Eliquis 5 mg PO BID ordered to start on 3/18 @ 1000.  Jody Nelson D 07/26/2022,6:17 AM

## 2022-07-26 NOTE — Progress Notes (Addendum)
Progress Note   Patient: Jody Nelson [id number] DOB: 1950-07-23 DOA: 07/22/2022     4 DOS: the patient was seen and examined on 07/26/2022   Brief hospital course: 72 y.o. female with medical history significant for hypercoagulable state with multiple cryptogenic CVAs,on Eliquis, ESRD on HD MWF, anemia of CKD, DMII , obesity with BMI over 50, OSA (not on CPAP), hypertension, HFpEF, biopsy-proven sarcoidosis, history of right tibial plateau fracture 2019 with hardware and chronic wound infection, chronic bilateral lymphedema hospitalized on month ago from 2/3 to 2/11 with cellulitis and abscess of the right leg s/p I&D and on wound VAC and discharged to rehab who presented to the ED as a code stroke after she was noted to be slurring her speech while at the dinner table.  She also had a new left arm weakness and had difficulty using her left arm to feed herself.   3/15: Neuro, Nephro, Palliative care c/s. HD today. 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  Patient requires max assist with any movement and all ADLs.  She requires Hoyer lift for transfers - continue lipitor   Hypercoagulable state (positive anticardiolipin, elevated homocysteine, factor V Leiden deficiency) Will need long-term anticoagulation.  Resume Eliquis today  Chronic anticoagulation Resuming Eliquis today  ESRD on hemodialysis ([place]) HD per nephro.  Dialysis per nephrology.  Will be getting HD today  Slurred speech Due to acute CVA.  Improving.  Very mild dysarthria. carotid Doppler shows 50  to 69% narrowing of left internal carotid artery.  Will await neuro input  Right-sided weakness From previous stroke.  Resuming Eliquis today  Morbid obesity with BMI of 50.0-59.9, adult ([place]) Complicates overall prognosis.  Essential hypertension Continue coreg, torsemide.  H/O: CVA (cerebrovascular accident) History of multiple previous strokes.  Diabetes mellitus with insulin therapy ([place]) SSI for now.  (HFpEF) heart failure with preserved ejection fraction ([place]) Well compensated at this time.  Fluid mgmt with HD.        Subjective: No new issues.  Feeling tired  Physical Exam: Vitals:   07/26/22 0022 07/26/22 0348 07/26/22 0807 07/26/22 1152  BP: (!) 124/42 (!) 128/39 (!) 123/49 (!) 116/38  Pulse: 79 79 75 80  Resp: 13 13 20 16   Temp: 98.4 F (36.9 C) 97.7 F (36.5 C) 98.6 F (37 C) 98.6 F (37 C)  TempSrc: Oral Oral Oral Oral  SpO2: 100% 99% 100% 100%  Weight:      Height:       72 year old morbidly obese female lying in the bed comfortably without any acute distress Lungs clear to auscultation bilaterally Heart regular rate and rhythm Abdomen soft, benign, obese difficult exam Neuro alert and awake, mild dysarthria, left sensory deficit present in incoordination of left upper extremity bilateral lower extremity drift to bed Psych normal mood and affect Data Reviewed:  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

## 2022-07-26 NOTE — Assessment & Plan Note (Signed)
MRI brain confirmed small acute CVA Neuro input appreciated Echo - wnl Resume Eliquis today Carotid US shows 50 to 69% narrowing on the left ICA - PT/OT/ST -SNF/long-term care at discharge.  Patient requires max assist with any movement and all ADLs.  She requires Hoyer lift for transfers - continue lipitor

## 2022-07-26 NOTE — TOC Progression Note (Addendum)
Transition of Care [facility]) - Progression Note    Patient Details  Name: Jody Nelson MRN: [id number] Date of Birth: 1950/06/01  [facility]) CM/SW Contact  Ross Ludwig, [place] Phone Number: 07/26/2022, 12:58 PM  Clinical Narrative:     Received message that daughter wants a facility in [place] attempted to contact daughter, and had to leave a message on her voice mail.  Patient is LTC at [facility], and it will be difficult to find another SNF that can accept LTC Medicaid patient.  TOC to continue to follow patient's progress throughout discharge planning.  2:30pm CSW spoke to patient's daughter to discuss facilities in [place] that she was interested in.  [place], [place], [employer] [facility], [facility], and [facility].  CSW explained to daughter that since patient is LTC Medicaid, it will be very challenging to find a facility that can accept patient under Medicaid.  CSW explained if other SNFs can not offer bed for patient, she would have to go back to [facility], and patient can be on a wait list at other facilities if they offer the option.  Patient's daughter expressed understanding.  CSW to try to reach SNFs in [place].  Expected Discharge Plan: [place] Barriers to Discharge: Continued Medical Work up, Ship broker  Expected Discharge Plan and [place] arrangements for the past 2 months: Learned Determinants of Health (SDOH) Interventions SDOH Screenings   Food Insecurity: No Food Insecurity (07/23/2022)  Housing: Low Risk  (07/23/2022)  Transportation Needs: No Transportation Needs (07/23/2022)  Utilities: Not At Risk (07/23/2022)  Tobacco Use: Medium Risk (07/22/2022)    Readmission Risk Interventions     No data to display

## 2022-07-26 NOTE — Progress Notes (Signed)
                                                     Palliative Care Progress Note, Assessment & Plan   Patient Name: Jody Nelson       Date: 07/26/2022 DOB: 1951-04-15  Age: 72 y.o. MRN#: [id number] Attending Physician: Max Sane, MD Primary Care Physician: Patient, No Pcp Per Admit Date: 07/22/2022  Subjective: Patient is lying in bed in no apparent distress.  She is asleep, awakens easily, and returns back to sleep.  She acknowledges my presence and is able to make her wishes known.  No c/o pain or discomfort at this time.  Summary of counseling/coordination of care: After reviewing the patient's chart and assessing the patient at bedside, I attempted to speak with patient's daughter over the phone.  No answer and HIPAA compliant voicemail left.  As per chart review, plan remains for patient to continue with HD and full code/full scope. TOC following closely for disposition.   No acute palliative needs today.  PMT will continue to follow.  Physical Exam Vitals reviewed.  Constitutional:      General: She is not in acute distress.    Appearance: She is obese. She is not ill-appearing.  HENT:     Head: Normocephalic.  Cardiovascular:     Rate and Rhythm: Normal rate.     Pulses: Normal pulses.  Pulmonary:     Effort: Pulmonary effort is normal.  Abdominal:     Palpations: Abdomen is soft.  Musculoskeletal:     Comments: Generalized weakness  Neurological:     Mental Status: She is alert and oriented to person, place, and time.  Psychiatric:        Mood and Affect: Mood normal.        Behavior: Behavior normal.             Total Time 25 minutes   Kobe Jansma L. Ilsa Iha, FNP-BC Palliative Medicine Team Team Phone # [phone]

## 2022-07-26 NOTE — Assessment & Plan Note (Signed)
From previous stroke.  Resuming Eliquis today

## 2022-07-26 NOTE — Assessment & Plan Note (Signed)
Continue coreg, torsemide.

## 2022-07-26 NOTE — Progress Notes (Signed)
[facility]  ROUNDING NOTE   Subjective:   Jody Nelson is a 72 y.o. female with past medical history of obesity, diabetes, OSA, hypertension, CHF, anemia, and end stage renal disease on hemodialysis. Patient presents to the emergency dept from her SNF due to stroke symptoms. Patient was admitted for Slurred speech [R47.81] Left-sided weakness [R53.1] Acute CVA (cerebrovascular accident) [facility]) [I63.9]   Patient is known to our practice and receives outpatient dialysis treatments at [facility] on a MWF schedule, supervised by Dr Candiss Norse.   Patient seen sitting up in bed, completed breakfast tray at bedside Patient states she feels well today Remains on room air Denies pain or discomfort   Objective:  Vital signs in last 24 hours:  Temp:  [97.7 F (36.5 C)-98.6 F (37 C)] 98.6 F (37 C) (03/18 1152) Pulse Rate:  [73-83] 80 (03/18 1152) Resp:  [13-22] 16 (03/18 1152) BP: (108-128)/(38-88) 116/38 (03/18 1152) SpO2:  [99 %-100 %] 100 % (03/18 1152)  Weight change:  Filed Weights   07/23/22 1847 07/24/22 1332 07/24/22 1611  Weight: 134.5 kg 133.9 kg 132.9 kg    Intake/Output: I/O last 3 completed shifts: In: 60 [P.O.:960] Out: 55 [Urine:55]   Intake/Output this shift:  Total I/O In: 120 [P.O.:120] Out: 250 [Urine:250]  Physical Exam: General: NAD  Head: Normocephalic, atraumatic. Moist oral mucosal membranes  Eyes: Anicteric  Lungs:  Clear to auscultation, normal effort.   Heart: Regular rate and rhythm  Abdomen:  Soft, nontender, obese  Extremities:  trace peripheral edema.   Neurologic: Alert, Left arm weakness  Skin: No lesions  Access: Lt AVG, Rt permcath    Basic Metabolic Panel: Recent Labs  Lab 07/22/22 2030 07/24/22 0316  NA 138 137  K 4.1 4.1  CL 101 101  CO2 28 30  GLUCOSE 178* 112*  BUN 18 15  CREATININE 4.75* 4.09*  CALCIUM 8.6* 8.4*     Liver Function Tests: Recent Labs  Lab 07/22/22 2030  AST 25  ALT 16   ALKPHOS 69  BILITOT 0.6  PROT 7.0  ALBUMIN 2.6*    No results for input(s): "LIPASE", "AMYLASE" in the last 168 hours. No results for input(s): "AMMONIA" in the last 168 hours.  CBC: Recent Labs  Lab 07/22/22 2030 07/24/22 0316  WBC 7.7 5.1  NEUTROABS 4.3  --   HGB 9.2* 8.8*  HCT 31.5* 29.3*  MCV 98.4 97.3  PLT 186 169     Cardiac Enzymes: No results for input(s): "CKTOTAL", "CKMB", "CKMBINDEX", "TROPONINI" in the last 168 hours.  BNP: Invalid input(s): "POCBNP"  CBG: Recent Labs  Lab 07/25/22 1223 07/25/22 1514 07/25/22 1958 07/26/22 0803 07/26/22 1148  GLUCAP 106* 82 119* 53 118*     Microbiology: Results for orders placed or performed during the hospital encounter of 06/12/22  MRSA Next Gen by PCR, Nasal     Status: None   Collection Time: 06/13/22 12:45 AM   Specimen: Nasal Mucosa; Nasal Swab  Result Value Ref Range Status   MRSA by PCR Next Gen NOT DETECTED NOT DETECTED Final    Comment: (NOTE) The GeneXpert MRSA Assay (FDA approved for NASAL specimens only), is one component of a comprehensive MRSA colonization surveillance program. It is not intended to diagnose MRSA infection nor to guide or monitor treatment for MRSA infections. Test performance is not FDA approved in patients less than 21 years old. Performed at [facility], [address], [place], [place] [address]   Culture, blood (Routine  X 2) w Reflex to ID Panel     Status: None   Collection Time: 06/13/22  9:34 AM   Specimen: BLOOD  Result Value Ref Range Status   Specimen Description BLOOD BLOOD LEFT HAND  Final   Special Requests   Final    BOTTLES DRAWN AEROBIC AND ANAEROBIC Blood Culture adequate volume   Culture   Final    NO GROWTH 5 DAYS Performed at [facility], [address] [place], [place] [address]    Report Status 06/18/2022 FINAL  Final  Aerobic Culture w Gram Stain (superficial specimen)     Status: None   Collection Time: 06/13/22  9:35 AM    Specimen: Abscess  Result Value Ref Range Status   Specimen Description   Final    ABSCESS S T Performed at [facility], [address], [place], [place] [address]    Special Requests   Final    NONE Performed at [facility], [address] [place], [place] [address]    Gram Stain   Final    FEW GRAM POSITIVE COCCI IN SINGLES IN PAIRS RARE GRAM VARIABLE ROD NO WBC SEEN Performed at [facility], [place] [address], [place], [place] [address]    Culture   Final    FEW MORGANELLA MORGANII FEW ESCHERICHIA COLI FEW STAPHYLOCOCCUS AUREUS    Report Status 06/17/2022 FINAL  Final   Organism ID, Bacteria MORGANELLA MORGANII  Final   Organism ID, Bacteria ESCHERICHIA COLI  Final   Organism ID, Bacteria STAPHYLOCOCCUS AUREUS  Final      Susceptibility   Escherichia coli - MIC*    AMPICILLIN >=32 RESISTANT Resistant     CEFAZOLIN <=4 SENSITIVE Sensitive     CEFEPIME <=0.12 SENSITIVE Sensitive     CEFTAZIDIME <=1 SENSITIVE Sensitive     CEFTRIAXONE <=0.25 SENSITIVE Sensitive     CIPROFLOXACIN >=4 RESISTANT Resistant     GENTAMICIN <=1 SENSITIVE Sensitive     IMIPENEM <=0.25 SENSITIVE Sensitive     TRIMETH/SULFA >=320 RESISTANT Resistant     AMPICILLIN/SULBACTAM 16 INTERMEDIATE Intermediate     PIP/TAZO <=4 SENSITIVE Sensitive     * FEW ESCHERICHIA COLI   Morganella morganii - MIC*    AMPICILLIN >=32 RESISTANT Resistant     CEFAZOLIN RESISTANT Resistant     CEFTAZIDIME <=1 SENSITIVE Sensitive     CIPROFLOXACIN >=4 RESISTANT Resistant     GENTAMICIN <=1 SENSITIVE Sensitive     IMIPENEM 0.5 SENSITIVE Sensitive     TRIMETH/SULFA >=320 RESISTANT Resistant     AMPICILLIN/SULBACTAM 16 INTERMEDIATE Intermediate     PIP/TAZO <=4 SENSITIVE Sensitive     * FEW MORGANELLA MORGANII   Staphylococcus aureus - MIC*    CIPROFLOXACIN >=8 RESISTANT Resistant     ERYTHROMYCIN >=8 RESISTANT Resistant     GENTAMICIN <=0.5 SENSITIVE Sensitive     OXACILLIN 2 SENSITIVE  Sensitive     TETRACYCLINE >=16 RESISTANT Resistant     VANCOMYCIN 1 SENSITIVE Sensitive     TRIMETH/SULFA >=320 RESISTANT Resistant     CLINDAMYCIN <=0.25 SENSITIVE Sensitive     RIFAMPIN <=0.5 SENSITIVE Sensitive     Inducible Clindamycin NEGATIVE Sensitive     * FEW STAPHYLOCOCCUS AUREUS  Culture, blood (Routine X 2) w Reflex to ID Panel     Status: None   Collection Time: 06/13/22  2:23 PM   Specimen: BLOOD  Result Value Ref Range Status   Specimen Description BLOOD BLOOD RIGHT FOREARM  Final  Special Requests   Final    BOTTLES DRAWN AEROBIC AND ANAEROBIC Blood Culture adequate volume   Culture   Final    NO GROWTH 5 DAYS Performed at [facility], [address] [place], [place] [address]    Report Status 06/18/2022 FINAL  Final  Aerobic/Anaerobic Culture w Gram Stain (surgical/deep wound)     Status: None   Collection Time: 06/15/22  2:37 PM   Specimen: Leg, Right; Wound  Result Value Ref Range Status   Specimen Description   Final    WOUND Performed at [facility], [address] [place], [place] [address]    Special Requests   Final    RIGHT LEG Performed at [facility], [address] [place], [place] [address]    Gram Stain   Final    FEW WBC PRESENT,BOTH PMN AND MONONUCLEAR RARE GRAM POSITIVE COCCI IN PAIRS    Culture   Final    RARE METHICILLIN RESISTANT STAPHYLOCOCCUS AUREUS RARE ESCHERICHIA COLI RARE GROUP B STREP(S.AGALACTIAE)ISOLATED TESTING AGAINST S. AGALACTIAE NOT ROUTINELY PERFORMED DUE TO PREDICTABILITY OF AMP/PEN/VAN SUSCEPTIBILITY. FEW MORGANELLA MORGANII NO ANAEROBES ISOLATED Performed at [facility], [place] [address], [place], [place] [address]    Report Status 06/21/2022 FINAL  Final   Organism ID, Bacteria ESCHERICHIA COLI  Final   Organism ID, Bacteria MORGANELLA MORGANII  Final   Organism ID, Bacteria METHICILLIN RESISTANT STAPHYLOCOCCUS AUREUS  Final      Susceptibility   Escherichia coli - MIC*     AMPICILLIN >=32 RESISTANT Resistant     CEFAZOLIN <=4 SENSITIVE Sensitive     CEFEPIME <=0.12 SENSITIVE Sensitive     CEFTAZIDIME <=1 SENSITIVE Sensitive     CEFTRIAXONE <=0.25 SENSITIVE Sensitive     CIPROFLOXACIN >=4 RESISTANT Resistant     GENTAMICIN <=1 SENSITIVE Sensitive     IMIPENEM <=0.25 SENSITIVE Sensitive     TRIMETH/SULFA >=320 RESISTANT Resistant     AMPICILLIN/SULBACTAM 16 INTERMEDIATE Intermediate     PIP/TAZO <=4 SENSITIVE Sensitive     * RARE ESCHERICHIA COLI   Morganella morganii - MIC*    AMPICILLIN >=32 RESISTANT Resistant     CEFAZOLIN >=64 RESISTANT Resistant     CEFTAZIDIME <=1 SENSITIVE Sensitive     CIPROFLOXACIN >=4 RESISTANT Resistant     GENTAMICIN <=1 SENSITIVE Sensitive     IMIPENEM 2 SENSITIVE Sensitive     TRIMETH/SULFA >=320 RESISTANT Resistant     AMPICILLIN/SULBACTAM >=32 RESISTANT Resistant     PIP/TAZO <=4 SENSITIVE Sensitive     * FEW MORGANELLA MORGANII   Methicillin resistant staphylococcus aureus - MIC*    CIPROFLOXACIN >=8 RESISTANT Resistant     ERYTHROMYCIN >=8 RESISTANT Resistant     GENTAMICIN <=0.5 SENSITIVE Sensitive     OXACILLIN >=4 RESISTANT Resistant     TETRACYCLINE >=16 RESISTANT Resistant     VANCOMYCIN 1 SENSITIVE Sensitive     TRIMETH/SULFA >=320 RESISTANT Resistant     CLINDAMYCIN <=0.25 SENSITIVE Sensitive     RIFAMPIN <=0.5 SENSITIVE Sensitive     Inducible Clindamycin NEGATIVE Sensitive     * RARE METHICILLIN RESISTANT STAPHYLOCOCCUS AUREUS    Coagulation Studies: No results for input(s): "LABPROT", "INR" in the last 72 hours.   Urinalysis: No results for input(s): "COLORURINE", "LABSPEC", "PHURINE", "GLUCOSEU", "HGBUR", "BILIRUBINUR", "KETONESUR", "PROTEINUR", "UROBILINOGEN", "NITRITE", "LEUKOCYTESUR" in the last 72 hours.  Invalid input(s): "APPERANCEUR"    Imaging: US Carotid Bilateral  Result Date: 07/26/2022 CLINICAL DATA:  Stroke. History of hypertension, hyperlipidemia  and diabetes. Former  smoker. EXAM: BILATERAL CAROTID DUPLEX ULTRASOUND TECHNIQUE: Pearline Cables scale imaging, color Doppler and duplex ultrasound were performed of bilateral carotid and vertebral arteries in the neck. COMPARISON:  None Available. FINDINGS: Criteria: Quantification of carotid stenosis is based on velocity parameters that correlate the residual internal carotid diameter with NASCET-based stenosis levels, using the diameter of the distal internal carotid lumen as the denominator for stenosis measurement. The following velocity measurements were obtained: RIGHT ICA: 63/9 cm/sec CCA: 123XX123 cm/sec SYSTOLIC ICA/CCA RATIO:  0.8 ECA: 64 cm/sec LEFT ICA: 131/25 cm/sec CCA: 0000000 cm/sec SYSTOLIC ICA/CCA RATIO:  1.7 ECA: 52 cm/sec RIGHT CAROTID ARTERY: There is a minimal amount of eccentric echogenic plaque within the right carotid bulb (images 5 and 19), extending to involve the origin and proximal aspects of the right internal carotid artery (image 26, not resulting in elevated peak systolic velocities within the interrogated course of the right internal carotid artery to suggest a hemodynamically significant stenosis. RIGHT VERTEBRAL ARTERY:  Antegrade flow LEFT CAROTID ARTERY: There is a minimal amount of eccentric echogenic plaque involving the origin and proximal aspects of the left internal carotid artery (image 62), which results in elevated peak systolic velocities within mid aspect of the left internal carotid artery, potentially accentuated due to vessel tortuosity. Greatest acquired peak systolic velocity within the mid left ICA measures 131 centimeters/seconds (image 68). LEFT VERTEBRAL ARTERY:  Antegrade flow IMPRESSION: 1. Minimal amount of left-sided atherosclerotic plaque results in elevated peak systolic velocities within the left internal carotid artery compatible with the 50-69% luminal narrowing range, potentially accentuated due to vessel tortuosity. Further evaluation with CTA could be performed as clinically  indicated. 2. Minimal amount of right-sided atherosclerotic plaque, not resulting in a hemodynamically significant stenosis. Electronically Signed   By: Sandi Mariscal M.D.   On: 07/26/2022 05:30     Medications:      apixaban  5 mg Oral BID   aspirin EC  325 mg Oral Daily   atorvastatin  80 mg Oral Daily   calcitRIOL  0.25 mcg Oral Daily   calcium acetate  667 mg Oral TID WC   carvedilol  25 mg Oral BID WC   Chlorhexidine Gluconate Cloth  6 each Topical Q0600   docusate sodium  100 mg Oral BID   gabapentin  200 mg Oral BID   insulin aspart  0-5 Units Subcutaneous QHS   insulin aspart  0-6 Units Subcutaneous TID WC   insulin glargine-yfgn  10 Units Subcutaneous QHS   latanoprost  1 drop Both Eyes QHS   multivitamin  1 tablet Oral BID   polyethylene glycol  17 g Oral BID   torsemide  10 mg Oral Daily   acetaminophen **OR** acetaminophen (TYLENOL) oral liquid 160 mg/5 mL **OR** acetaminophen, ipratropium-albuterol, traZODone  Assessment/ Plan:  Ms. Laney Boeker is a 72 y.o.  female with past medical history of obesity, diabetes, OSA, hypertension, CHF, anemia, and end stage renal disease on hemodialysis. Patient presents to the emergency dept from her SNF due to stroke symptoms. Patient was admitted for Slurred speech [R47.81] Left-sided weakness [R53.1] Acute CVA (cerebrovascular accident) ([place]) [I63.9]  [facility]/MWF/LT AVG/Rt permcth/134kg  End stage renal disease on hemodialysis. Patient scheduled to receive dialysis later today, UF goal 1.5 L as tolerated. Next treatment scheduled for Wednesday. Renal navigator aware of patient and monitoring discharge plan.  If patient does relocate to SNF closer to [facility], she will require outpatient dialysis clinic transfer.  2.Anemia of chronic  kidney disease Lab Results  Component Value Date   HGB 8.8 (L) 07/24/2022    Hemoglobin below desired goal but stable.  Will continue to monitor for ESA's.  3. Secondary  Hyperparathyroidism: with outpatient labs: PTH 463, phosphorus 6.8, calcium 8.4 on 05/17/22    Lab Results  Component Value Date   CALCIUM 8.4 (L) 07/24/2022   CAION 1.13 (L) 04/16/2022   PHOS 4.5 06/21/2022  Calcium and phosphorus within optimal range. Continue calcium acetate and calcitriol with meals.   4. Diabetes mellitus type II with chronic kidney disease/renal manifestations: insulin dependent. Home regimen includes Lantus.   Primary team to manage sliding scale insulin   LOS: 4 Aniela Caniglia 3/18/202412:27 PM

## 2022-07-26 NOTE — Assessment & Plan Note (Addendum)
Due to acute CVA.  Improving.  Very mild dysarthria. carotid Doppler shows 50 to 69% narrowing of left internal carotid artery.  Will await neuro input

## 2022-07-26 NOTE — Plan of Care (Signed)
Carotid ultrasound: Minimal amount of left-sided atherosclerotic plaque results in elevated peak systolic velocities within the left internal carotid artery compatible with the 50-69% luminal narrowing range, potentially accentuated due to vessel tortuosity. Minimal amount of right-sided atherosclerotic plaque, not resulting in a hemodynamically significant stenosis.  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  Etiology of stroke favored to be 2/2 multiple known hypercoagulable states.  - Left carotid stenosis is not on the same side as her acute small stroke, therefore no acute intervention indicated.  - She may benefit from inpatient or outpatient vascular surgery consult for possible left CEA given the old left MCA stroke visible on MRI.  - Agree with continuing Eliquis for secondary stroke prevention in the context of her hypercoagulable state.  - Outpatient Neurology follow up.  - No other recommendations at this time. Neurohospitalist service will be available for questions going forward.  Electronically signed: Dr. Kerney Elbe

## 2022-07-27 DIAGNOSIS — I639 Cerebral infarction, unspecified: Secondary | ICD-10-CM | POA: Diagnosis not present

## 2022-07-27 DIAGNOSIS — Z7901 Long term (current) use of anticoagulants: Secondary | ICD-10-CM | POA: Diagnosis not present

## 2022-07-27 DIAGNOSIS — I5030 Unspecified diastolic (congestive) heart failure: Secondary | ICD-10-CM | POA: Diagnosis not present

## 2022-07-27 DIAGNOSIS — D6859 Other primary thrombophilia: Secondary | ICD-10-CM | POA: Diagnosis not present

## 2022-07-27 DIAGNOSIS — N186 End stage renal disease: Secondary | ICD-10-CM | POA: Diagnosis not present

## 2022-07-27 DIAGNOSIS — R4781 Slurred speech: Secondary | ICD-10-CM | POA: Diagnosis not present

## 2022-07-27 DIAGNOSIS — R531 Weakness: Secondary | ICD-10-CM | POA: Diagnosis not present

## 2022-07-27 LAB — GLUCOSE, CAPILLARY
Glucose-Capillary: 166 mg/dL — ABNORMAL HIGH (ref 70–99)
Glucose-Capillary: 141 mg/dL — ABNORMAL HIGH (ref 70–99)
Glucose-Capillary: 193 mg/dL — ABNORMAL HIGH (ref 70–99)

## 2022-07-27 LAB — HEPATITIS B SURFACE ANTIBODY, QUANTITATIVE: Hep B S AB Quant (Post): 22.1 m[IU]/mL (ref >9.9)

## 2022-07-27 MED ORDER — ASPIRIN 325 MG PO TBEC: 325.0000 mg | DELAYED_RELEASE_TABLET | Freq: Every day | ORAL | Status: DC

## 2022-07-27 MED ORDER — APIXABAN 5 MG PO TABS: 5.0000 mg | ORAL_TABLET | Freq: Two times a day (BID) | ORAL | Status: AC

## 2022-07-27 NOTE — Progress Notes (Signed)
Palliative Care Progress Note, Assessment & Plan   Patient Name: Jody Nelson       Date: 07/27/2022 DOB: [birth year]  Age: 72 y.o. MRN#: [id number] Attending Physician: Max Sane, MD Primary Care Physician: Patient, No Pcp Per Admit Date: 07/22/2022  Subjective: Patient is sitting up in bed in no apparent distress.  She acknowledges my presence and is able to make her wishes known.  Her speech remains blurred but is no different from other discussions of had with her.  No family or friends at bedside.  HPI: 72 y.o. 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   As per code stroke protocol, head CT revealed no acute hemorrhage or acute core infarct.  Teleneurology did not think findings were consistent with LVO and recommended getting MRI while holding anticoagulation until MRI could determine presence of stroke/size.   While in the ER, patient began to regain some of the initial deficits -slurred speech still present but improving and left upper extremity weakness almost back to baseline.   [facility] was consulted to discuss goals of care.  Summary of counseling/coordination of care: After reviewing the patient's  chart and assessing the patient at bedside, I discussed plan and goals of care with patient.  We discussed advanced care planning, CODE STATUS, rehospitalization, and artificial nutrition and hydration.  Patient is accepting of all offered, available, and appropriate medical interventions to keep her alive.  She is accepting of CPR, use of mechanical ventilation if needed, defibrillation, and other means to sustain her life.  However, patient was clear that she would never want to live on machines long-term.  She says she would like to have the chance to recover with use of machines such as mechanical ventilators or feeding tubes.  However, she was clear that she would never want to be kept alive solely by machines.  She shares that if she cannot live off of the machines then she would want them removed and for her to "go on".  In the event patient is unable to speak for herself, patient states she would want her daughter to make her medical decisions for her.  Full code and full scope remain.  Patient would never accept a PEG or tracheostomy.  Patient set for discharge today.  No acute palliative needs at this time.  Physical Exam Vitals reviewed.  Constitutional:      General: She is not in acute distress.    Appearance: She is obese.  HENT:     Head: Normocephalic.     Mouth/Throat:     Mouth: Mucous membranes are moist.  Eyes:     Pupils:  Pupils are equal, round, and reactive to light.  Pulmonary:     Effort: Pulmonary effort is normal.  Abdominal:     Palpations: Abdomen is soft.  Skin:    General: Skin is warm and dry.  Neurological:     Mental Status: She is alert and oriented to person, place, and time.  Psychiatric:        Mood and Affect: Mood normal.        Behavior: Behavior normal.        Thought Content: Thought content normal.        Judgment: Judgment normal.             Total Time 35 minutes   Dyshawn Cangelosi L. Ilsa Iha, FNP-BC Palliative Medicine Team Team Phone  # [phone]

## 2022-07-27 NOTE — Discharge Summary (Addendum)
Physician Discharge Summary   Patient: Jody Nelson MRN: [id number] DOB: [birth year]  Admit date:     07/22/2022  Discharge date: 07/27/22  Discharge Physician: Max Sane   PCP: Patient, No Pcp Per   Recommendations at discharge:    F/up with outpt providers as requested  Discharge Diagnoses: Principal Problem:   Acute CVA (cerebrovascular accident) [facility]) Active Problems:   Hypercoagulable state (positive anticardiolipin, elevated homocysteine, factor V Leiden deficiency)   Chronic anticoagulation   ESRD on hemodialysis ([place])   (HFpEF) heart failure with preserved ejection fraction ([place])   Diabetes mellitus with insulin therapy ([place])   H/O: CVA (cerebrovascular accident)   Essential hypertension   Morbid obesity with BMI of 50.0-59.9, adult ([place])   Left-sided weakness   Slurred speech  Resolved Problems:   * No resolved hospital problems. *  Hospital Course: 72 y.o. female with medical history significant for hypercoagulable state with multiple cryptogenic CVAs,on Eliquis, ESRD on HD MWF, anemia of CKD, DMII , obesity with BMI over 50, OSA (not on CPAP), hypertension, HFpEF, biopsy-proven sarcoidosis, history of right tibial plateau fracture 2019 with hardware and chronic wound infection, chronic bilateral lymphedema hospitalized on month ago from 2/3 to 2/11 with cellulitis and abscess of the right leg s/p I&D and on wound VAC and discharged to rehab who presented to the ED as a code stroke after she was noted to be slurring her speech while at the dinner table.  She also had a new left arm weakness and had difficulty using her left arm to feed herself.   3/15: Neuro, Nephro, Palliative care c/s. HD today. MRI showed small acute infarct 3/16: Incomplete dialysis yesterday due to increased venous pressure so will get another session today to complete 3/17: Carotid Doppler pending 3/18: Restarted Eliquis, HD today  Assessment and Plan: * Acute CVA (cerebrovascular accident)  ([place]) Slurred Speech Residual left sided weakness from old cva MRI brain confirmed small acute CVA. Mid dysarthria remains Neuro seen and recommends asa  Echo - wnl Carotid US shows 50 to 69% narrowing on the left ICA - outpt vascular surgery f/up - PT/OT/ST -SNF/long-term care at discharge.  Patient requires max assist with any movement and all ADLs.  She requires Hoyer lift for transfers. Recommend Palliative care at D/C - overall poor prognosis - continue lipitor  Hypercoagulable state (positive anticardiolipin, elevated homocysteine, factor V Leiden deficiency) Chronic anticoagulation Will need long-term anticoagulation/eliquis  ESRD on hemodialysis ([place]) HD per nephro.  Morbid obesity with BMI of 50.0-59.9, adult ([place]) Complicates overall prognosis.  Essential hypertension Continue coreg, torsemide.  Diabetes mellitus with insulin therapy ([place]) (HFpEF) heart failure with preserved ejection fraction (HCC) Well compensated at this time.  Fluid mgmt with HD.         Consultants: Neuro Procedures performed: HD  Disposition: Skilled nursing facility Diet recommendation:  Discharge Diet Orders (From admission, onward)     Start     Ordered   07/27/22 0000  Diet - low sodium heart healthy        07/27/22 0847           Carb modified diet DISCHARGE MEDICATION: Allergies as of 07/27/2022       Reactions   Sulfamethoxazole-trimethoprim    Other reaction(s): Kidney Disorder Other reaction(s): Kidney Disorder Hyperkalemia, AKI Hyperkalemia, AKI        Medication List     STOP taking these medications    ceFEPime  IVPB Commonly known as: MAXIPIME   HYDROcodone-acetaminophen 5-325 MG tablet  Commonly known as: NORCO/VICODIN   ipratropium-albuterol 0.5-2.5 (3) MG/3ML Soln Commonly known as: DUONEB   Lidocaine Pain Relief 4 % Generic drug: lidocaine   oxyCODONE 5 MG immediate release tablet Commonly known as: Oxy IR/ROXICODONE   traZODone 50 MG  tablet Commonly known as: DESYREL       TAKE these medications    acetaminophen 325 MG tablet Commonly known as: TYLENOL Take 2 tablets by mouth every 8 (eight) hours as needed.   ACIDOPHILUS LACTOBACILLUS PO Take 1 capsule by mouth 3 (three) times daily.   apixaban 5 MG Tabs tablet Commonly known as: ELIQUIS Take 1 tablet (5 mg total) by mouth 2 (two) times daily.   atorvastatin 80 MG tablet Commonly known as: LIPITOR Take 80 mg by mouth daily.   calcitRIOL 0.25 MCG capsule Commonly known as: ROCALTROL Take 0.25 mcg by mouth daily.   calcium acetate 667 MG capsule Commonly known as: PHOSLO Take one capsule by mouth twice daily with food on Mondays, Wednesdays, and Fridays. Take one capsule 3 times daily on Tuesdays, Thursdays, Saturdays, and Sundays.   carvedilol 25 MG tablet Commonly known as: COREG Take 1 tablet by mouth 2 (two) times daily with a meal.   diclofenac Sodium 1 % Gel Commonly known as: VOLTAREN Apply topically.   docusate sodium 100 MG capsule Commonly known as: COLACE Take 100 mg by mouth 2 (two) times daily.   ferrous sulfate 324 (65 Fe) MG Tbec Take 1 tablet by mouth daily.   folic acid 1 MG tablet Commonly known as: FOLVITE Take 1 mg by mouth daily.   gabapentin 100 MG capsule Commonly known as: NEURONTIN Take 2 capsules by mouth 2 (two) times daily.   insulin glargine 100 UNIT/ML Solostar Pen Commonly known as: LANTUS Inject 10 Units into the skin at bedtime.   insulin lispro 100 UNIT/ML injection Commonly known as: HUMALOG Inject 2-12 Units into the skin in the morning, at noon, and at bedtime. Per sliding scale   latanoprost 0.005 % ophthalmic solution Commonly known as: XALATAN Place 1 drop into both eyes at bedtime.   metroNIDAZOLE 1 % gel Commonly known as: METROGEL Apply topically.   montelukast 10 MG tablet Commonly known as: SINGULAIR Take 10 mg by mouth daily.   omeprazole 10 MG capsule Commonly known as:  PRILOSEC Take 10 mg by mouth daily.   Oyster Shell Calcium w/D 500-5 MG-MCG Tabs Take 1 tablet by mouth daily as needed.   polyethylene glycol 17 g packet Commonly known as: MIRALAX / GLYCOLAX Take 17 g by mouth daily.   PreserVision AREDS Tabs Take 250 mg by mouth 2 (two) times daily.   torsemide 10 MG tablet Commonly known as: DEMADEX Take 10 mg by mouth daily.        Follow-up Information     Anabel Bene, MD. Schedule an appointment as soon as possible for a visit in 2 week(s).   Specialty: Neurology Contact information: [facility] [place] [address] [phone]         Cyndi Bender, MD. Schedule an appointment as soon as possible for a visit in 1 week(s).   Specialty: Geriatric Medicine Why: [facility] Discharge F/UP Contact information: [address] [place] [address] [phone]         Drema Pry, NP. Schedule an appointment as soon as possible for a visit in 3 week(s).   Specialty: Vascular Surgery Why: [facility] Discharge  F/UP Contact information: [place] [address] [phone]                Discharge Exam: Danley Danker Weights   07/24/22 1611 07/26/22 1419 07/26/22 1759  Weight: 132.9 kg 134.4 kg 23.53 kg   72 year old morbidly obese female lying in the bed comfortably without any acute distress Lungs clear to auscultation bilaterally Heart regular rate and rhythm Abdomen soft, benign, obese difficult exam Neuro alert and awake, mild dysarthria, left sensory deficit present in incoordination of left upper extremity bilateral lower extremity drift to bed Psych normal mood and affect  Condition at discharge: fair  The results of significant diagnostics from this hospitalization (including imaging, microbiology, ancillary and laboratory) are listed below for reference.   Imaging Studies: VAS US DUPLEX DIALYSIS ACCESS (AVF,  AVG)  Result Date: 07/26/2022 DIALYSIS ACCESS Patient Name:  KAITLEY COSLETT  Date of Exam:   07/20/2022 Medical Rec #: [id number]    Accession #:    [id number] Date of Birth: 02/01/1951    Patient Gender: F Patient Age:   67 years Exam Location:  [facility] Procedure:      VAS US DUPLEX DIALYSIS ACCESS (AVF, AVG) Referring Phys: Duard Brady --------------------------------------------------------------------------------  Reason for Exam: Routine follow up. Access Site: Left Upper Extremity. Access Type: BrachAx. History: 04/16/2022 New Left Brach Ax AVG. Comparison Study: 05/27/2022 Performing Technologist: Concha Norway RVT  Examination Guidelines: A complete evaluation includes B-mode imaging, spectral Doppler, color Doppler, and power Doppler as needed of all accessible portions of each vessel. Unilateral testing is considered an integral part of a complete examination. Limited examinations for reoccurring indications may be performed as noted.  Findings:   +--------------------+----------+-----------------+-------------------+ AVG                 PSV (cm/s)Flow Vol (mL/min)     Describe       +--------------------+----------+-----------------+-------------------+ Native artery inflow   210          1370                           +--------------------+----------+-----------------+-------------------+ Arterial anastomosis   323                                         +--------------------+----------+-----------------+-------------------+ Prox graft             193                     .12cm narrowed grft +--------------------+----------+-----------------+-------------------+ Mid graft              123                                         +--------------------+----------+-----------------+-------------------+ Distal graft           144                                         +--------------------+----------+-----------------+-------------------+ Venous anastomosis     183                                          +--------------------+----------+-----------------+-------------------+  Venous outflow         152                                         +--------------------+----------+-----------------+-------------------+ +--------------+------------+----------+---------+---------+-------------------+                 Diameter  Depth (cm)Branching   PSV       Flow Volume                       (cm)                        (cm/s)       (ml/min)       +--------------+------------+----------+---------+---------+-------------------+ Lt Rad Art Dis                                  26                        +--------------+------------+----------+---------+---------+-------------------+ antegrade                                                                 +--------------+------------+----------+---------+---------+-------------------+  Summary: Patent left BrachAx AVG with a small vessel diameter seen (.12cm) in the proximal graft just distal to the anastomosis site.  *See table(s) above for measurements and observations.  Diagnosing physician: Leotis Pain MD Electronically signed by Leotis Pain MD on 07/26/2022 at 12:49:52 PM.   --------------------------------------------------------------------------------   Final    US Carotid Bilateral  Result Date: 07/26/2022 CLINICAL DATA:  Stroke. History of hypertension, hyperlipidemia and diabetes. Former smoker. EXAM: BILATERAL CAROTID DUPLEX ULTRASOUND TECHNIQUE: Pearline Cables scale imaging, color Doppler and duplex ultrasound were performed of bilateral carotid and vertebral arteries in the neck. COMPARISON:  None Available. FINDINGS: Criteria: Quantification of carotid stenosis is based on velocity parameters that correlate the residual internal carotid diameter with NASCET-based stenosis levels, using the diameter of the distal internal carotid lumen as the denominator for stenosis measurement. The following velocity  measurements were obtained: RIGHT ICA: 63/9 cm/sec CCA: 123XX123 cm/sec SYSTOLIC ICA/CCA RATIO:  0.8 ECA: 64 cm/sec LEFT ICA: 131/25 cm/sec CCA: 0000000 cm/sec SYSTOLIC ICA/CCA RATIO:  1.7 ECA: 52 cm/sec RIGHT CAROTID ARTERY: There is a minimal amount of eccentric echogenic plaque within the right carotid bulb (images 5 and 19), extending to involve the origin and proximal aspects of the right internal carotid artery (image 26, not resulting in elevated peak systolic velocities within the interrogated course of the right internal carotid artery to suggest a hemodynamically significant stenosis. RIGHT VERTEBRAL ARTERY:  Antegrade flow LEFT CAROTID ARTERY: There is a minimal amount of eccentric echogenic plaque involving the origin and proximal aspects of the left internal carotid artery (image 62), which results in elevated peak systolic velocities within mid aspect of the left internal carotid artery, potentially accentuated due to vessel tortuosity. Greatest acquired peak systolic velocity within the mid left ICA measures 131 centimeters/seconds (image 68). LEFT VERTEBRAL ARTERY:  Antegrade flow IMPRESSION: 1. Minimal amount of left-sided atherosclerotic plaque results in elevated peak systolic  velocities within the left internal carotid artery compatible with the 50-69% luminal narrowing range, potentially accentuated due to vessel tortuosity. Further evaluation with CTA could be performed as clinically indicated. 2. Minimal amount of right-sided atherosclerotic plaque, not resulting in a hemodynamically significant stenosis. Electronically Signed   By: Sandi Mariscal M.D.   On: 07/26/2022 05:30   ECHOCARDIOGRAM COMPLETE  Result Date: 07/23/2022    ECHOCARDIOGRAM REPORT   Patient Name:   TEHILA COSTANZO Date of Exam: 07/23/2022 Medical Rec #:  [id number]   Height:       64.0 in Accession #:    [id number]  Weight:       297.4 lb Date of Birth:  [birth year]   BSA:          2.316 m Patient Age:    44 years    BP:            131/47 mmHg Patient Gender: F           HR:           83 bpm. Exam Location:  [facility] Procedure: 2D Echo, Cardiac Doppler and Color Doppler Indications:     Stroke  History:         Patient has no prior history of Echocardiogram examinations.                  CHF, Stroke, Signs/Symptoms:Chest Pain and Dyspnea; Risk                  Factors:Hypertension, Diabetes, Dyslipidemia and Sleep Apnea.  Sonographer:     Wenda Low Referring Phys:  [id number] Athena Masse Diagnosing Phys: Ida Rogue MD  Sonographer Comments: Technically difficult study due to poor echo windows and patient is obese. Image acquisition challenging due to respiratory motion. IMPRESSIONS  1. Left ventricular ejection fraction, by estimation, is 60 to 65%. The left ventricle has normal function. The left ventricle has no regional wall motion abnormalities. There is mild left ventricular hypertrophy. Left ventricular diastolic parameters are consistent with Grade I diastolic dysfunction (impaired relaxation).  2. Right ventricular systolic function is normal. The right ventricular size is normal.  3. The mitral valve is normal in structure. No evidence of mitral valve regurgitation. No evidence of mitral stenosis.  4. The aortic valve has an indeterminant number of cusps. Aortic valve regurgitation is not visualized. Aortic valve sclerosis/calcification is present, without any evidence of aortic stenosis. Aortic valve mean gradient measures 5.0 mmHg.  5. The inferior vena cava is normal in size with greater than 50% respiratory variability, suggesting right atrial pressure of 3 mmHg. FINDINGS  Left Ventricle: Left ventricular ejection fraction, by estimation, is 60 to 65%. The left ventricle has normal function. The left ventricle has no regional wall motion abnormalities. The left ventricular internal cavity size was normal in size. There is  mild left ventricular hypertrophy. Left ventricular diastolic parameters are consistent with Grade I  diastolic dysfunction (impaired relaxation). Right Ventricle: The right ventricular size is normal. No increase in right ventricular wall thickness. Right ventricular systolic function is normal. Left Atrium: Left atrial size was normal in size. Right Atrium: Right atrial size was normal in size. Pericardium: Trivial pericardial effusion is present. Mitral Valve: The mitral valve is normal in structure. No evidence of mitral valve regurgitation. No evidence of mitral valve stenosis. MV peak gradient, 5.6 mmHg. The mean mitral valve gradient is 2.0 mmHg. Tricuspid Valve: The tricuspid valve is normal in structure. Tricuspid valve regurgitation is  trivial. No evidence of tricuspid stenosis. Aortic Valve: The aortic valve has an indeterminant number of cusps. Aortic valve regurgitation is not visualized. Aortic valve sclerosis/calcification is present, without any evidence of aortic stenosis. Aortic valve mean gradient measures 5.0 mmHg. Aortic valve peak gradient measures 9.4 mmHg. Aortic valve area, by VTI measures 2.35 cm. Pulmonic Valve: The pulmonic valve was normal in structure. Pulmonic valve regurgitation is not visualized. No evidence of pulmonic stenosis. Aorta: The aortic root is normal in size and structure. Venous: The inferior vena cava is normal in size with greater than 50% respiratory variability, suggesting right atrial pressure of 3 mmHg. IAS/Shunts: No atrial level shunt detected by color flow Doppler.  LEFT VENTRICLE PLAX 2D LVIDd:         3.70 cm   Diastology LVIDs:         2.30 cm   LV e' medial:    8.27 cm/s LV PW:         1.10 cm   LV E/e' medial:  12.7 LV IVS:        1.20 cm   LV e' lateral:   10.60 cm/s LVOT diam:     2.00 cm   LV E/e' lateral: 9.9 LV SV:         83 LV SV Index:   36 LVOT Area:     3.14 cm  RIGHT VENTRICLE RV Basal diam:  4.10 cm RV Mid diam:    3.30 cm RV S prime:     17.20 cm/s TAPSE (M-mode): 2.4 cm LEFT ATRIUM             Index        RIGHT ATRIUM           Index LA  diam:        3.20 cm 1.38 cm/m   RA Area:     18.40 cm LA Vol (A2C):   72.1 ml 31.14 ml/m  RA Volume:   59.20 ml  25.56 ml/m LA Vol (A4C):   75.1 ml 32.43 ml/m LA Biplane Vol: 78.9 ml 34.07 ml/m  AORTIC VALVE                     PULMONIC VALVE AV Area (Vmax):    2.28 cm      PV Vmax:       1.05 m/s AV Area (Vmean):   2.22 cm      PV Peak grad:  4.4 mmHg AV Area (VTI):     2.35 cm AV Vmax:           153.00 cm/s AV Vmean:          106.000 cm/s AV VTI:            0.355 m AV Peak Grad:      9.4 mmHg AV Mean Grad:      5.0 mmHg LVOT Vmax:         111.00 cm/s LVOT Vmean:        74.900 cm/s LVOT VTI:          0.265 m LVOT/AV VTI ratio: 0.75  AORTA Ao Root diam: 2.80 cm MITRAL VALVE MV Area (PHT): 3.23 cm     SHUNTS MV Area VTI:   3.00 cm     Systemic VTI:  0.26 m MV Peak grad:  5.6 mmHg     Systemic Diam: 2.00 cm MV Mean grad:  2.0 mmHg MV Vmax:  1.18 m/s MV Vmean:      71.0 cm/s MV Decel Time: 235 msec MV E velocity: 105.00 cm/s MV A velocity: 85.90 cm/s MV E/A ratio:  1.22 Ida Rogue MD Electronically signed by Ida Rogue MD Signature Date/Time: 07/23/2022/4:20:27 PM    Final    MR BRAIN WO CONTRAST  Result Date: 07/22/2022 CLINICAL DATA:  Acute neurologic deficit EXAM: MRI HEAD WITHOUT CONTRAST TECHNIQUE: Multiplanar, multiecho pulse sequences of the brain and surrounding structures were obtained without intravenous contrast. COMPARISON:  Brain MRI 03/02/2022 FINDINGS: Brain: Small acute infarct of the right centrum semiovale. No acute hemorrhage or mass effect. Numerous chronic microhemorrhages in a mixed central and peripheral distribution. 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 Normal white matter signal, parenchymal volume and CSF spaces. The midline structures are normal. Vascular: Normal flow voids. Skull and upper cervical spine: Normal marrow signal. Sinuses/Orbits: Negative. Other: None. IMPRESSION: 1. Small acute infarct of the right centrum semiovale. 2. Numerous chronic  microhemorrhages in a mixed central and peripheral distribution, which could be due to chronic hypertensive or cerebral amyloid angiopathy. 3. 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 Electronically Signed   By: Ulyses Jarred M.D.   On: 07/22/2022 23:38   CT HEAD CODE STROKE WO CONTRAST  Result Date: 07/22/2022 CLINICAL DATA:  Code stroke.  Left-sided weakness EXAM: CT HEAD WITHOUT CONTRAST TECHNIQUE: Contiguous axial images were obtained from the base of the skull through the vertex without intravenous contrast. RADIATION DOSE REDUCTION: This exam was performed according to the departmental dose-optimization program which includes automated exposure control, adjustment of the mA and/or kV according to patient size and/or use of iterative reconstruction technique. COMPARISON:  05/01/2022 FINDINGS: Brain: There is no mass, hemorrhage or extra-axial collection. The size and configuration of the ventricles and extra-axial CSF spaces are normal. 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 Vascular: No abnormal hyperdensity of the major intracranial arteries or dural venous sinuses. No intracranial atherosclerosis. Skull: The visualized skull base, calvarium and extracranial soft tissues are normal. Sinuses/Orbits: No fluid levels or advanced mucosal thickening of the visualized paranasal sinuses. No mastoid or middle ear effusion. The orbits are normal. ASPECTS [facility] Stroke Program Early CT Score) - Ganglionic level infarction (caudate, lentiform nuclei, internal capsule, insula, M1-M3 cortex): 7 - Supraganglionic infarction (M4-M6 cortex): 3 Total score (0-10 with 10 being normal): 10 IMPRESSION: 1. No acute intracranial abnormality. 2. 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 3. ASPECTS is 10. These results were called by telephone at the time of interpretation on 07/22/2022 at 8:01 pm to provider [facility] , who verbally  acknowledged these results. Electronically Signed   By: Ulyses Jarred M.D.   On: 07/22/2022 20:01    Microbiology: Results for orders placed or performed during the hospital encounter of 06/12/22  MRSA Next Gen by PCR, Nasal     Status: None   Collection Time: 06/13/22 12:45 AM   Specimen: Nasal Mucosa; Nasal Swab  Result Value Ref Range Status   MRSA by PCR Next Gen NOT DETECTED NOT DETECTED Final    Comment: (NOTE) The GeneXpert MRSA Assay (FDA approved for NASAL specimens only), is one component of a comprehensive MRSA colonization surveillance program. It is not intended to diagnose MRSA infection nor to guide or monitor treatment for MRSA infections. Test performance is not FDA approved in patients less than 35 years old. Performed at [facility], [address] [place], [place] [address]  Culture, blood (Routine X 2) w Reflex to ID Panel     Status: None   Collection Time: 06/13/22  9:34 AM   Specimen: BLOOD  Result Value Ref Range Status   Specimen Description BLOOD BLOOD LEFT HAND  Final   Special Requests   Final    BOTTLES DRAWN AEROBIC AND ANAEROBIC Blood Culture adequate volume   Culture   Final    NO GROWTH 5 DAYS Performed at [facility], [address] [place], [place] [address]    Report Status 06/18/2022 FINAL  Final  Aerobic Culture w Gram Stain (superficial specimen)     Status: None   Collection Time: 06/13/22  9:35 AM   Specimen: Abscess  Result Value Ref Range Status   Specimen Description   Final    ABSCESS S T Performed at [facility], [address] [place], [place] [address]    Special Requests   Final    NONE Performed at [facility], [address] [place], [place] [address]    Gram Stain   Final    FEW GRAM POSITIVE COCCI IN SINGLES IN PAIRS RARE GRAM VARIABLE ROD NO WBC SEEN Performed at [facility], [place] [address], [place], [place] [address]    Culture   Final    FEW MORGANELLA  MORGANII FEW ESCHERICHIA COLI FEW STAPHYLOCOCCUS AUREUS    Report Status 06/17/2022 FINAL  Final   Organism ID, Bacteria MORGANELLA MORGANII  Final   Organism ID, Bacteria ESCHERICHIA COLI  Final   Organism ID, Bacteria STAPHYLOCOCCUS AUREUS  Final      Susceptibility   Escherichia coli - MIC*    AMPICILLIN >=32 RESISTANT Resistant     CEFAZOLIN <=4 SENSITIVE Sensitive     CEFEPIME <=0.12 SENSITIVE Sensitive     CEFTAZIDIME <=1 SENSITIVE Sensitive     CEFTRIAXONE <=0.25 SENSITIVE Sensitive     CIPROFLOXACIN >=4 RESISTANT Resistant     GENTAMICIN <=1 SENSITIVE Sensitive     IMIPENEM <=0.25 SENSITIVE Sensitive     TRIMETH/SULFA >=320 RESISTANT Resistant     AMPICILLIN/SULBACTAM 16 INTERMEDIATE Intermediate     PIP/TAZO <=4 SENSITIVE Sensitive     * FEW ESCHERICHIA COLI   Morganella morganii - MIC*    AMPICILLIN >=32 RESISTANT Resistant     CEFAZOLIN RESISTANT Resistant     CEFTAZIDIME <=1 SENSITIVE Sensitive     CIPROFLOXACIN >=4 RESISTANT Resistant     GENTAMICIN <=1 SENSITIVE Sensitive     IMIPENEM 0.5 SENSITIVE Sensitive     TRIMETH/SULFA >=320 RESISTANT Resistant     AMPICILLIN/SULBACTAM 16 INTERMEDIATE Intermediate     PIP/TAZO <=4 SENSITIVE Sensitive     * FEW MORGANELLA MORGANII   Staphylococcus aureus - MIC*    CIPROFLOXACIN >=8 RESISTANT Resistant     ERYTHROMYCIN >=8 RESISTANT Resistant     GENTAMICIN <=0.5 SENSITIVE Sensitive     OXACILLIN 2 SENSITIVE Sensitive     TETRACYCLINE >=16 RESISTANT Resistant     VANCOMYCIN 1 SENSITIVE Sensitive     TRIMETH/SULFA >=320 RESISTANT Resistant     CLINDAMYCIN <=0.25 SENSITIVE Sensitive     RIFAMPIN <=0.5 SENSITIVE Sensitive     Inducible Clindamycin NEGATIVE Sensitive     * FEW STAPHYLOCOCCUS AUREUS  Culture, blood (Routine X 2) w Reflex to ID Panel     Status: None   Collection Time: 06/13/22  2:23 PM   Specimen: BLOOD  Result Value Ref Range Status   Specimen Description BLOOD BLOOD RIGHT FOREARM  Final   Special  Requests   Final    BOTTLES DRAWN AEROBIC AND ANAEROBIC Blood Culture adequate volume   Culture   Final    NO GROWTH 5 DAYS Performed at [facility], [address] [place], [place] [address]    Report Status 06/18/2022 FINAL  Final  Aerobic/Anaerobic Culture w Gram Stain (surgical/deep wound)     Status: None   Collection Time: 06/15/22  2:37 PM   Specimen: Leg, Right; Wound  Result Value Ref Range Status   Specimen Description   Final    WOUND Performed at [facility], [address] [place], [place] [address]    Special Requests   Final    RIGHT LEG Performed at [facility], [address] [place], [place] [address]    Gram Stain   Final    FEW WBC PRESENT,BOTH PMN AND MONONUCLEAR RARE GRAM POSITIVE COCCI IN PAIRS    Culture   Final    RARE METHICILLIN RESISTANT STAPHYLOCOCCUS AUREUS RARE ESCHERICHIA COLI RARE GROUP B STREP(S.AGALACTIAE)ISOLATED TESTING AGAINST S. AGALACTIAE NOT ROUTINELY PERFORMED DUE TO PREDICTABILITY OF AMP/PEN/VAN SUSCEPTIBILITY. FEW MORGANELLA MORGANII NO ANAEROBES ISOLATED Performed at [facility], [place] [address] [place], [place] [address]    Report Status 06/21/2022 FINAL  Final   Organism ID, Bacteria ESCHERICHIA COLI  Final   Organism ID, Bacteria MORGANELLA MORGANII  Final   Organism ID, Bacteria METHICILLIN RESISTANT STAPHYLOCOCCUS AUREUS  Final      Susceptibility   Escherichia coli - MIC*    AMPICILLIN >=32 RESISTANT Resistant     CEFAZOLIN <=4 SENSITIVE Sensitive     CEFEPIME <=0.12 SENSITIVE Sensitive     CEFTAZIDIME <=1 SENSITIVE Sensitive     CEFTRIAXONE <=0.25 SENSITIVE Sensitive     CIPROFLOXACIN >=4 RESISTANT Resistant     GENTAMICIN <=1 SENSITIVE Sensitive     IMIPENEM <=0.25 SENSITIVE Sensitive     TRIMETH/SULFA >=320 RESISTANT Resistant     AMPICILLIN/SULBACTAM 16 INTERMEDIATE Intermediate     PIP/TAZO <=4 SENSITIVE Sensitive     * RARE ESCHERICHIA COLI   Morganella morganii - MIC*     AMPICILLIN >=32 RESISTANT Resistant     CEFAZOLIN >=64 RESISTANT Resistant     CEFTAZIDIME <=1 SENSITIVE Sensitive     CIPROFLOXACIN >=4 RESISTANT Resistant     GENTAMICIN <=1 SENSITIVE Sensitive     IMIPENEM 2 SENSITIVE Sensitive     TRIMETH/SULFA >=320 RESISTANT Resistant     AMPICILLIN/SULBACTAM >=32 RESISTANT Resistant     PIP/TAZO <=4 SENSITIVE Sensitive     * FEW MORGANELLA MORGANII   Methicillin resistant staphylococcus aureus - MIC*    CIPROFLOXACIN >=8 RESISTANT Resistant     ERYTHROMYCIN >=8 RESISTANT Resistant     GENTAMICIN <=0.5 SENSITIVE Sensitive     OXACILLIN >=4 RESISTANT Resistant     TETRACYCLINE >=16 RESISTANT Resistant     VANCOMYCIN 1 SENSITIVE Sensitive     TRIMETH/SULFA >=320 RESISTANT Resistant     CLINDAMYCIN <=0.25 SENSITIVE Sensitive     RIFAMPIN <=0.5 SENSITIVE Sensitive     Inducible Clindamycin NEGATIVE Sensitive     * RARE METHICILLIN RESISTANT STAPHYLOCOCCUS AUREUS    Labs: CBC: Recent Labs  Lab 07/22/22 2030 07/24/22 0316 07/26/22 1506  WBC 7.7 5.1 6.6  NEUTROABS 4.3  --   --   HGB 9.2* 8.8* 8.8*  HCT 31.5* 29.3* 30.5*  MCV 98.4 97.3 99.3  PLT 186 169 401   Basic Metabolic Panel: Recent Labs  Lab  07/22/22 2030 07/24/22 0316 07/26/22 1506  NA 138 137 135  K 4.1 4.1 5.3*  CL 101 101 99  CO2 28 30 29   GLUCOSE 178* 112* 160*  BUN 18 15 22   CREATININE 4.75* 4.09* 4.38*  CALCIUM 8.6* 8.4* 8.7*  PHOS  --   --  3.6   Liver Function Tests: Recent Labs  Lab 07/22/22 2030 07/26/22 1506  AST 25  --   ALT 16  --   ALKPHOS 69  --   BILITOT 0.6  --   PROT 7.0  --   ALBUMIN 2.6* 2.2*   CBG: Recent Labs  Lab 07/26/22 0803 07/26/22 1148 07/26/22 1837 07/26/22 2045 07/27/22 0745  GLUCAP 81 118* 119* 154* 166*    Discharge time spent: greater than 30 minutes.  Signed: Max Sane, MD [facility] 07/27/2022

## 2022-07-27 NOTE — Progress Notes (Signed)
[facility]  ROUNDING NOTE   Subjective:   Jody Nelson is a 73 y.o. female with past medical history of obesity, diabetes, OSA, hypertension, CHF, anemia, and end stage renal disease on hemodialysis. Patient presents to the emergency dept from her SNF due to stroke symptoms. Patient was admitted for Slurred speech [R47.81] Left-sided weakness [R53.1] Acute CVA (cerebrovascular accident) [facility]) [I63.9]   Patient is known to our practice and receives outpatient dialysis treatments at [facility] on a MWF schedule, supervised by Dr Candiss Norse.   Patient resting quietly Easily awakened when breakfast tray arrives Denies pain or discomfort States she slept well.     Objective:  Vital signs in last 24 hours:  Temp:  [98.1 F (36.7 C)-98.7 F (37.1 C)] 98.7 F (37.1 C) (03/19 0752) Pulse Rate:  [72-85] 72 (03/19 0752) Resp:  [10-22] 11 (03/19 0752) BP: (108-148)/(35-84) 111/51 (03/19 0752) SpO2:  [100 %] 100 % (03/19 0752) Weight:  [133.6 kg-134.4 kg] 133.6 kg (03/18 1759)  Weight change:  Filed Weights   07/24/22 1611 07/26/22 1419 07/26/22 1759  Weight: 132.9 kg 134.4 kg 133.6 kg    Intake/Output: I/O last 3 completed shifts: In: 840 [P.O.:840] Out: 1750 [Urine:250; Other:1500]   Intake/Output this shift:  No intake/output data recorded.  Physical Exam: General: NAD  Head: Normocephalic, atraumatic. Moist oral mucosal membranes  Eyes: Anicteric  Lungs:  Clear to auscultation, normal effort.   Heart: Regular rate and rhythm  Abdomen:  Soft, nontender, obese  Extremities:  trace peripheral edema.   Neurologic: Alert, Left arm weakness  Skin: No lesions  Access: Lt AVG, Rt permcath    Basic Metabolic Panel: Recent Labs  Lab 07/22/22 2030 07/24/22 0316 07/26/22 1506  NA 138 137 135  K 4.1 4.1 5.3*  CL 101 101 99  CO2 28 30 29   GLUCOSE 178* 112* 160*  BUN 18 15 22   CREATININE 4.75* 4.09* 4.38*  CALCIUM 8.6* 8.4* 8.7*  PHOS  --   --  3.6      Liver Function Tests: Recent Labs  Lab 07/22/22 2030 07/26/22 1506  AST 25  --   ALT 16  --   ALKPHOS 69  --   BILITOT 0.6  --   PROT 7.0  --   ALBUMIN 2.6* 2.2*    No results for input(s): "LIPASE", "AMYLASE" in the last 168 hours. No results for input(s): "AMMONIA" in the last 168 hours.  CBC: Recent Labs  Lab 07/22/22 2030 07/24/22 0316 07/26/22 1506  WBC 7.7 5.1 6.6  NEUTROABS 4.3  --   --   HGB 9.2* 8.8* 8.8*  HCT 31.5* 29.3* 30.5*  MCV 98.4 97.3 99.3  PLT 186 169 208     Cardiac Enzymes: No results for input(s): "CKTOTAL", "CKMB", "CKMBINDEX", "TROPONINI" in the last 168 hours.  BNP: Invalid input(s): "POCBNP"  CBG: Recent Labs  Lab 07/26/22 0803 07/26/22 1148 07/26/22 1837 07/26/22 2045 07/27/22 0745  GLUCAP 81 118* 119* 154* 40*     Microbiology: Results for orders placed or performed during the hospital encounter of 06/12/22  MRSA Next Gen by PCR, Nasal     Status: None   Collection Time: 06/13/22 12:45 AM   Specimen: Nasal Mucosa; Nasal Swab  Result Value Ref Range Status   MRSA by PCR Next Gen NOT DETECTED NOT DETECTED Final    Comment: (NOTE) The GeneXpert MRSA Assay (FDA approved for NASAL specimens only), is one component of a comprehensive MRSA colonization surveillance program. It  is not intended to diagnose MRSA infection nor to guide or monitor treatment for MRSA infections. Test performance is not FDA approved in patients less than 35 years old. Performed at [facility], [address] [place], [place] [address]   Culture, blood (Routine X 2) w Reflex to ID Panel     Status: None   Collection Time: 06/13/22  9:34 AM   Specimen: BLOOD  Result Value Ref Range Status   Specimen Description BLOOD BLOOD LEFT HAND  Final   Special Requests   Final    BOTTLES DRAWN AEROBIC AND ANAEROBIC Blood Culture adequate volume   Culture   Final    NO GROWTH 5 DAYS Performed at [facility], [address],  [place], [place] [address]    Report Status 06/18/2022 FINAL  Final  Aerobic Culture w Gram Stain (superficial specimen)     Status: None   Collection Time: 06/13/22  9:35 AM   Specimen: Abscess  Result Value Ref Range Status   Specimen Description   Final    ABSCESS S T Performed at [facility], [address] [place], [place] [address]    Special Requests   Final    NONE Performed at [facility], [address] [place], [place] [address]    Gram Stain   Final    FEW GRAM POSITIVE COCCI IN SINGLES IN PAIRS RARE GRAM VARIABLE ROD NO WBC SEEN Performed at [facility], [place] [address] [place], [place] [address]    Culture   Final    FEW MORGANELLA MORGANII FEW ESCHERICHIA COLI FEW STAPHYLOCOCCUS AUREUS    Report Status 06/17/2022 FINAL  Final   Organism ID, Bacteria MORGANELLA MORGANII  Final   Organism ID, Bacteria ESCHERICHIA COLI  Final   Organism ID, Bacteria STAPHYLOCOCCUS AUREUS  Final      Susceptibility   Escherichia coli - MIC*    AMPICILLIN >=32 RESISTANT Resistant     CEFAZOLIN <=4 SENSITIVE Sensitive     CEFEPIME <=0.12 SENSITIVE Sensitive     CEFTAZIDIME <=1 SENSITIVE Sensitive     CEFTRIAXONE <=0.25 SENSITIVE Sensitive     CIPROFLOXACIN >=4 RESISTANT Resistant     GENTAMICIN <=1 SENSITIVE Sensitive     IMIPENEM <=0.25 SENSITIVE Sensitive     TRIMETH/SULFA >=320 RESISTANT Resistant     AMPICILLIN/SULBACTAM 16 INTERMEDIATE Intermediate     PIP/TAZO <=4 SENSITIVE Sensitive     * FEW ESCHERICHIA COLI   Morganella morganii - MIC*    AMPICILLIN >=32 RESISTANT Resistant     CEFAZOLIN RESISTANT Resistant     CEFTAZIDIME <=1 SENSITIVE Sensitive     CIPROFLOXACIN >=4 RESISTANT Resistant     GENTAMICIN <=1 SENSITIVE Sensitive     IMIPENEM 0.5 SENSITIVE Sensitive     TRIMETH/SULFA >=320 RESISTANT Resistant     AMPICILLIN/SULBACTAM 16 INTERMEDIATE Intermediate     PIP/TAZO <=4 SENSITIVE Sensitive     * FEW MORGANELLA MORGANII    Staphylococcus aureus - MIC*    CIPROFLOXACIN >=8 RESISTANT Resistant     ERYTHROMYCIN >=8 RESISTANT Resistant     GENTAMICIN <=0.5 SENSITIVE Sensitive     OXACILLIN 2 SENSITIVE Sensitive     TETRACYCLINE >=16 RESISTANT Resistant     VANCOMYCIN 1 SENSITIVE Sensitive     TRIMETH/SULFA >=320 RESISTANT Resistant     CLINDAMYCIN <=0.25 SENSITIVE Sensitive     RIFAMPIN <=0.5 SENSITIVE Sensitive     Inducible Clindamycin NEGATIVE Sensitive     * FEW STAPHYLOCOCCUS  AUREUS  Culture, blood (Routine X 2) w Reflex to ID Panel     Status: None   Collection Time: 06/13/22  2:23 PM   Specimen: BLOOD  Result Value Ref Range Status   Specimen Description BLOOD BLOOD RIGHT FOREARM  Final   Special Requests   Final    BOTTLES DRAWN AEROBIC AND ANAEROBIC Blood Culture adequate volume   Culture   Final    NO GROWTH 5 DAYS Performed at [facility], [address] [place], [place] [address]    Report Status 06/18/2022 FINAL  Final  Aerobic/Anaerobic Culture w Gram Stain (surgical/deep wound)     Status: None   Collection Time: 06/15/22  2:37 PM   Specimen: Leg, Right; Wound  Result Value Ref Range Status   Specimen Description   Final    WOUND Performed at [facility], [address] [place], [place] [address]    Special Requests   Final    RIGHT LEG Performed at [facility], [address] [place], [place] [address]    Gram Stain   Final    FEW WBC PRESENT,BOTH PMN AND MONONUCLEAR RARE GRAM POSITIVE COCCI IN PAIRS    Culture   Final    RARE METHICILLIN RESISTANT STAPHYLOCOCCUS AUREUS RARE ESCHERICHIA COLI RARE GROUP B STREP(S.AGALACTIAE)ISOLATED TESTING AGAINST S. AGALACTIAE NOT ROUTINELY PERFORMED DUE TO PREDICTABILITY OF AMP/PEN/VAN SUSCEPTIBILITY. FEW MORGANELLA MORGANII NO ANAEROBES ISOLATED Performed at [facility], [place] [address] [place], [place] [address]    Report Status 06/21/2022 FINAL  Final   Organism ID, Bacteria ESCHERICHIA COLI   Final   Organism ID, Bacteria MORGANELLA MORGANII  Final   Organism ID, Bacteria METHICILLIN RESISTANT STAPHYLOCOCCUS AUREUS  Final      Susceptibility   Escherichia coli - MIC*    AMPICILLIN >=32 RESISTANT Resistant     CEFAZOLIN <=4 SENSITIVE Sensitive     CEFEPIME <=0.12 SENSITIVE Sensitive     CEFTAZIDIME <=1 SENSITIVE Sensitive     CEFTRIAXONE <=0.25 SENSITIVE Sensitive     CIPROFLOXACIN >=4 RESISTANT Resistant     GENTAMICIN <=1 SENSITIVE Sensitive     IMIPENEM <=0.25 SENSITIVE Sensitive     TRIMETH/SULFA >=320 RESISTANT Resistant     AMPICILLIN/SULBACTAM 16 INTERMEDIATE Intermediate     PIP/TAZO <=4 SENSITIVE Sensitive     * RARE ESCHERICHIA COLI   Morganella morganii - MIC*    AMPICILLIN >=32 RESISTANT Resistant     CEFAZOLIN >=64 RESISTANT Resistant     CEFTAZIDIME <=1 SENSITIVE Sensitive     CIPROFLOXACIN >=4 RESISTANT Resistant     GENTAMICIN <=1 SENSITIVE Sensitive     IMIPENEM 2 SENSITIVE Sensitive     TRIMETH/SULFA >=320 RESISTANT Resistant     AMPICILLIN/SULBACTAM >=32 RESISTANT Resistant     PIP/TAZO <=4 SENSITIVE Sensitive     * FEW MORGANELLA MORGANII   Methicillin resistant staphylococcus aureus - MIC*    CIPROFLOXACIN >=8 RESISTANT Resistant     ERYTHROMYCIN >=8 RESISTANT Resistant     GENTAMICIN <=0.5 SENSITIVE Sensitive     OXACILLIN >=4 RESISTANT Resistant     TETRACYCLINE >=16 RESISTANT Resistant     VANCOMYCIN 1 SENSITIVE Sensitive     TRIMETH/SULFA >=320 RESISTANT Resistant     CLINDAMYCIN <=0.25 SENSITIVE Sensitive     RIFAMPIN <=0.5 SENSITIVE Sensitive     Inducible Clindamycin NEGATIVE Sensitive     * RARE METHICILLIN RESISTANT STAPHYLOCOCCUS AUREUS    Coagulation Studies: No results for input(s): "LABPROT", "INR" in the last  72 hours.   Urinalysis: No results for input(s): "COLORURINE", "LABSPEC", "PHURINE", "GLUCOSEU", "HGBUR", "BILIRUBINUR", "KETONESUR", "PROTEINUR", "UROBILINOGEN", "NITRITE", "LEUKOCYTESUR" in the last 72  hours.  Invalid input(s): "APPERANCEUR"    Imaging: US Carotid Bilateral  Result Date: 07/26/2022 CLINICAL DATA:  Stroke. History of hypertension, hyperlipidemia and diabetes. Former smoker. EXAM: BILATERAL CAROTID DUPLEX ULTRASOUND TECHNIQUE: Pearline Cables scale imaging, color Doppler and duplex ultrasound were performed of bilateral carotid and vertebral arteries in the neck. COMPARISON:  None Available. FINDINGS: Criteria: Quantification of carotid stenosis is based on velocity parameters that correlate the residual internal carotid diameter with NASCET-based stenosis levels, using the diameter of the distal internal carotid lumen as the denominator for stenosis measurement. The following velocity measurements were obtained: RIGHT ICA: 63/9 cm/sec CCA: 123XX123 cm/sec SYSTOLIC ICA/CCA RATIO:  0.8 ECA: 64 cm/sec LEFT ICA: 131/25 cm/sec CCA: 0000000 cm/sec SYSTOLIC ICA/CCA RATIO:  1.7 ECA: 52 cm/sec RIGHT CAROTID ARTERY: There is a minimal amount of eccentric echogenic plaque within the right carotid bulb (images 5 and 19), extending to involve the origin and proximal aspects of the right internal carotid artery (image 26, not resulting in elevated peak systolic velocities within the interrogated course of the right internal carotid artery to suggest a hemodynamically significant stenosis. RIGHT VERTEBRAL ARTERY:  Antegrade flow LEFT CAROTID ARTERY: There is a minimal amount of eccentric echogenic plaque involving the origin and proximal aspects of the left internal carotid artery (image 62), which results in elevated peak systolic velocities within mid aspect of the left internal carotid artery, potentially accentuated due to vessel tortuosity. Greatest acquired peak systolic velocity within the mid left ICA measures 131 centimeters/seconds (image 68). LEFT VERTEBRAL ARTERY:  Antegrade flow IMPRESSION: 1. Minimal amount of left-sided atherosclerotic plaque results in elevated peak systolic velocities within the left  internal carotid artery compatible with the 50-69% luminal narrowing range, potentially accentuated due to vessel tortuosity. Further evaluation with CTA could be performed as clinically indicated. 2. Minimal amount of right-sided atherosclerotic plaque, not resulting in a hemodynamically significant stenosis. Electronically Signed   By: Sandi Mariscal M.D.   On: 07/26/2022 05:30     Medications:      apixaban  5 mg Oral BID   aspirin EC  325 mg Oral Daily   atorvastatin  80 mg Oral Daily   calcitRIOL  0.25 mcg Oral Daily   calcium acetate  667 mg Oral TID WC   carvedilol  25 mg Oral BID WC   Chlorhexidine Gluconate Cloth  6 each Topical Q0600   docusate sodium  100 mg Oral BID   gabapentin  200 mg Oral BID   insulin aspart  0-5 Units Subcutaneous QHS   insulin aspart  0-6 Units Subcutaneous TID WC   insulin glargine-yfgn  10 Units Subcutaneous QHS   latanoprost  1 drop Both Eyes QHS   multivitamin  1 tablet Oral BID   polyethylene glycol  17 g Oral BID   torsemide  10 mg Oral Daily   acetaminophen **OR** acetaminophen (TYLENOL) oral liquid 160 mg/5 mL **OR** acetaminophen, heparin sodium (porcine), ipratropium-albuterol, traZODone  Assessment/ Plan:  Jody Nelson is a 72 y.o.  female with past medical history of obesity, diabetes, OSA, hypertension, CHF, anemia, and end stage renal disease on hemodialysis. Patient presents to the emergency dept from her SNF due to stroke symptoms. Patient was admitted for Slurred speech [R47.81] Left-sided weakness [R53.1] Acute CVA (cerebrovascular accident) ([place]) [I63.9]  [facility]WF/LT AVG/Rt permcth/134kg  End stage renal  disease on hemodialysis. Dialysis received yesterday, UF 1.5L achieved.  Next treatment scheduled for Wednesday. Renal navigator aware that patient will be returning to [facility].   2.Anemia of chronic kidney disease Lab Results  Component Value Date   HGB 8.8 (L) 07/26/2022    Hemoglobin stable.   Will continue to monitor for ESA's.  3. Secondary Hyperparathyroidism: with outpatient labs: PTH 463, phosphorus 6.8, calcium 8.4 on 05/17/22    Lab Results  Component Value Date   CALCIUM 8.7 (L) 07/26/2022   CAION 1.13 (L) 04/16/2022   PHOS 3.6 07/26/2022  Monitoring bone minerals Continue calcium acetate and calcitriol with meals.   4. Diabetes mellitus type II with chronic kidney disease/renal manifestations: insulin dependent. Home regimen includes Lantus.   Primary team to manage sliding scale insulin   LOS: 5 Shalay Carder 3/19/202411:23 AM

## 2022-07-27 NOTE — Progress Notes (Signed)
Report called to Otila Kluver at [facility]

## 2022-07-27 NOTE — Progress Notes (Addendum)
Speech Language Pathology Treatment: Cognitive-Linquistic  Patient Details Name: Jody Nelson MRN: [id number] DOB: 09-29-1950 Today's Date: 07/27/2022 Time: 0950-1005 SLP Time Calculation (min) (ACUTE ONLY): 15 min  Assessment / Plan / Recommendation Clinical Impression  Pt seen today for dysarthria tx. Pt sitting up in bed watching tv upon ST arrival. Pt alert, cooperative, and pleasant. Pt left sitting up in bed w/ bed alarm set and call button in reach.  Tx focused on education of dysarthria compensatory strategies. Noted pt's speech 100% intelligible during this tx session w/ residual slight min articulatory imprecision. ST used dysarthria strategies handout in room from previous session to review/discuss strategies. Pt reported she has been using strategies to increase intelligibility; pt expressed that speaking slowly has been especially helpful in her intelligibility. Pt reported communication partners having more difficulty understanding her over the phone, and ST recommended applying dysarthria strategies (speak SLOW, talk LOUD, over-articulation) as needed. Pt appreciative and agreed.  Recommend continue w/ current POC to address mild dysarthria during admit. Recommend pt apply dysarthria strategies as needed. Pt/RN updated and agreed.   HPI HPI: Pt is a 72 year old female admitted with acute CVA right centrum semiovale and numerous chronic microhemorrhages. 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  No CXR per chrat.      SLP Plan  Continue  with current plan of care      Recommendations for follow up therapy are one component of a multi-disciplinary discharge planning process, led by the attending physician.  Recommendations may be updated based on patient status, additional functional criteria and insurance authorization.    Recommendations                   Oral Care Recommendations: Oral care before and after PO;Oral care BID Follow Up Recommendations: Follow physician's recommendations for discharge plan and follow up therapies Assistance recommended at discharge: PRN SLP Visit Diagnosis: Dysarthria and anarthria (R47.1) Plan: Continue with current plan of care         Randall Hiss Graduate Clinician [place], Speech Pathology   Randall Hiss  07/27/2022, 10:09 AM

## 2022-07-27 NOTE — TOC Transition Note (Addendum)
Transition of Care [employer]) - CM/SW Discharge Note   Patient Details  Name: Jody Nelson MRN: [id number] Date of Birth: [birth year]  [facility] of Care [facility] CM/SW Contact:  Laurena Slimmer, RN Phone Number: 07/27/2022, 9:42 AM   Clinical Narrative:    Per Neoma Laming in admissions at [facility] patient can be accepted today Patient assigned room # 204-A Nurse will call report to [phone] Family notified.  Face sheet and medical necessity forms printed to the floor to be added to the EMS pack EMS arranged for 11:00am Discharge summary and SNF tranfer report sent in [place].  TOC signing off.   Facilty requesting Arlyce Harman started.   1:35pm Per Navi portal auth approved [id number] 3/19-3/21   Final next level of care: Skilled Nursing Facility Barriers to Discharge: Barriers Resolved   Patient Goals and CMS Choice      Discharge Placement                Patient [facility] bed at: [facility] Patient to be transferred to facility by: [facility] Name of family member notified: Threasa Beards Patient and family notified of of transfer: 07/27/22  Discharge Plan and Services Additional resources added to the After Visit Summary for                                       Social Determinants of Health (SDOH) Interventions SDOH Screenings   Food Insecurity: No Food Insecurity (07/23/2022)  Housing: [place]  (07/23/2022)  Transportation Needs: No Transportation Needs (07/23/2022)  Utilities: Not At Risk (07/23/2022)  Tobacco Use: Medium Risk (07/22/2022)     Readmission Risk Interventions     No data to display

## 2022-07-29 ENCOUNTER — Telehealth (INDEPENDENT_AMBULATORY_CARE_PROVIDER_SITE_OTHER): Payer: Self-pay

## 2022-07-29 NOTE — Telephone Encounter (Signed)
I attempted to contact the patient to schedule a permcath removal with Dr. Lucky Cowboy. I was unable to leave a message as the voicemail box was full. I spoke with Jody Nelson at [employer]. [place] and the patient will be scheduled on 08/02/22 with a 1:00 pm arrival to [facility]. This will be sent to attention Jody Nelson at [facility]. [employer].

## 2022-08-02 ENCOUNTER — Encounter
Admission: RE | Disposition: A | Discharge status: Home / Self Care | Payer: Self-pay | Source: Ambulatory Visit | Attending: Vascular Surgery

## 2022-08-02 ENCOUNTER — Encounter: Payer: Self-pay | Admitting: Vascular Surgery

## 2022-08-02 ENCOUNTER — Ambulatory Visit
Admission: RE | Admit: 2022-08-02 | Discharge: 2022-08-02 | Disposition: A | Discharge status: Home / Self Care | Payer: 59 | Source: Ambulatory Visit | Attending: Vascular Surgery | Admitting: Vascular Surgery

## 2022-08-02 DIAGNOSIS — E1122 Type 2 diabetes mellitus with diabetic chronic kidney disease: Secondary | ICD-10-CM | POA: Diagnosis not present

## 2022-08-02 DIAGNOSIS — I5032 Chronic diastolic (congestive) heart failure: Secondary | ICD-10-CM | POA: Diagnosis not present

## 2022-08-02 DIAGNOSIS — N186 End stage renal disease: Secondary | ICD-10-CM | POA: Insufficient documentation

## 2022-08-02 DIAGNOSIS — Z87891 Personal history of nicotine dependence: Secondary | ICD-10-CM

## 2022-08-02 DIAGNOSIS — T8249XA Other complication of vascular dialysis catheter, initial encounter: Secondary | ICD-10-CM | POA: Diagnosis present

## 2022-08-02 DIAGNOSIS — I120 Hypertensive chronic kidney disease with stage 5 chronic kidney disease or end stage renal disease: Secondary | ICD-10-CM | POA: Diagnosis not present

## 2022-08-02 DIAGNOSIS — Y841 Kidney dialysis as the cause of abnormal reaction of the patient, or of later complication, without mention of misadventure at the time of the procedure: Secondary | ICD-10-CM | POA: Diagnosis not present

## 2022-08-02 DIAGNOSIS — I132 Hypertensive heart and chronic kidney disease with heart failure and with stage 5 chronic kidney disease, or end stage renal disease: Secondary | ICD-10-CM | POA: Insufficient documentation

## 2022-08-02 DIAGNOSIS — Z95828 Presence of other vascular implants and grafts: Secondary | ICD-10-CM

## 2022-08-02 DIAGNOSIS — I2510 Atherosclerotic heart disease of native coronary artery without angina pectoris: Secondary | ICD-10-CM | POA: Diagnosis not present

## 2022-08-02 DIAGNOSIS — Z992 Dependence on renal dialysis: Secondary | ICD-10-CM | POA: Diagnosis not present

## 2022-08-02 HISTORY — PX: DIALYSIS/PERMA CATHETER REMOVAL: CATH118289

## 2022-08-02 SURGERY — DIALYSIS/PERMA CATHETER REMOVAL
Anesthesia: LOCAL

## 2022-08-02 MED ORDER — LIDOCAINE-EPINEPHRINE (PF) 1 %-1:200000 IJ SOLN
INTRAMUSCULAR | Status: DC | PRN
  Administered 2022-08-02: 20 mL via INTRADERMAL

## 2022-08-02 SURGICAL SUPPLY — 4 items
APL PRP STRL LF DISP 70% ISPRP (MISCELLANEOUS) ×2
CHLORAPREP W/TINT 26 (MISCELLANEOUS) IMPLANT
FORCEPS HALSTEAD CVD 5IN STRL (INSTRUMENTS) IMPLANT
TRAY LACERAT/PLASTIC (MISCELLANEOUS) IMPLANT

## 2022-08-02 NOTE — H&P (Signed)
[facility] Admission History & Physical  MRN : [id number]  Doratha Discala is a 72 y.o. (04-Nov-1950) female who presents with chief complaint of No chief complaint on file. Marland Kitchen  History of Present Illness: I am asked to evaluate the patient by the dialysis center. The patient was sent here because they have a nonfunctioning tunneled catheter and a functioning AV graft.  The patient reports they're not been any problems with any of their dialysis runs. They are reporting good flows with good parameters at dialysis.   Patient denies pain or tenderness overlying the access.  There is no pain with dialysis.  The patient denies hand pain or finger pain consistent with steal syndrome.  No fevers or chills while on dialysis.    No current facility-administered medications for this encounter.    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   At high risk for falls    Atypical chest pain    Bilateral lower extremity edema    Chronic right sided weakness    Cryptogenic stroke ([place]) 02/27/2011   a.) MRI brain 02/27/2011: old lacunar infarct in RIGHT pons and medulla oblongata   Cryptogenic stroke ([place]) 10/11/2012   a.) CT brain 10/11/2012: tiny old lacunar infarct in head of caudate nucleus (left)   Cryptogenic stroke ([place]) 10/12/2012   a.) MRI brain 10/12/2012: multiple acute LEFT hemispheric infarcts   Cryptogenic stroke ([place]) 06/26/2014   a.) MRI brain 06/26/2014: acute RIGHT posterior frontal lobe infarct involving both and grey matter    Cryptogenic stroke ([place]) 07/29/2015   a.) MRI brain 07/29/2015: 2 foci of diffusion restriction in the LEFT parietal lobe consistent with acute infarction   Cryptogenic stroke ([place]) 07/24/2017   a.) MRI brain 07/24/2017: punctate focus of acute ischemia in the medial LEFT temporal lobe   DDD (degenerative disc disease), cervical    Depression    DOE (dyspnea on exertion)    Dysarthria as late effect of stroke    ESRD (end stage renal disease) on dialysis ([place])    a.) M-W-F   GERD (gastroesophageal reflux disease)    Hallux valgus, bilateral    Heterozygous factor V Leiden mutation ([place]) 07/18/2018   History of cardiac catheterization 08/30/2008   a.) LHC 08/30/2008: EF >60%, LVEDP 18 mmHg, normal coronaries.   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   a.) 07/18/2018 --> 22 mol/L   Hypertension    Insomnia    a.) melatonin + trazodone PRN   Long term current use of anticoagulant    a.) apixaban   Lymphedema of both lower extremities    Meralgia paresthetica, left lower limb    OSA (obstructive sleep apnea)    a.) does not require nocturnal PAP therapy   Osteoarthritis    Pneumonia    Pseudophakia of both eyes  Sarcoidosis    Secondary hyperparathyroidism of renal origin ([place])    Type 2 diabetes mellitus treated with insulin ([place])    Vitamin D deficiency     Past Surgical History:  Procedure Laterality Date   APPLICATION OF WOUND VAC Right 06/15/2022   Procedure: APPLICATION OF WOUND VAC;  Surgeon: Leim Fabry, MD;  Location: [facility];  Service: Orthopedics;  Laterality: Right;   AV FISTULA PLACEMENT Left 04/16/2022   Procedure: INSERTION OF ARTERIOVENOUS (AV) GORE-TEX GRAFT ARM (BRACHIAL AXILLARY);  Surgeon: Katha Cabal, MD;  Location: [facility];  Service: Vascular;  Laterality: Left;   CESAREAN SECTION     DIALYSIS/PERMA CATHETER INSERTION N/A 10/14/2021   Procedure: DIALYSIS/PERMA CATHETER INSERTION;  Surgeon: Algernon Huxley, MD;  Location: [facility];  Service: Cardiovascular;  Laterality: N/A;   FRACTURE SURGERY     leg right fracture several surgeries, rods   INCISION AND DRAINAGE OF WOUND Right 06/15/2022   Procedure: IRRIGATION AND DEBRIDEMENT WOUND;  Surgeon: Leim Fabry, MD;  Location: [facility];  Service: Orthopedics;  Laterality: Right;     Social History   Tobacco Use   Smoking status: Former    Types: Cigarettes   Smokeless tobacco: Never  Substance Use Topics   Alcohol use: Never   Drug use: Never    Family History  Problem Relation Age of Onset   Diabetes Mother    Colon cancer Sister    Diabetes Sister    Cancer Maternal Uncle    Clotting disorder Paternal Grandmother    Colon cancer Paternal Grandfather     No family history of bleeding or clotting disorders, autoimmune disease or porphyria  Allergies  Allergen Reactions   Sulfamethoxazole-Trimethoprim     Other reaction(s): Kidney Disorder Other reaction(s): Kidney Disorder Hyperkalemia, AKI Hyperkalemia, AKI      REVIEW OF SYSTEMS (Negative unless checked)  Constitutional: [] Weight loss  [] Fever  [] Chills Cardiac: [] Chest pain   [] Chest pressure   [] Palpitations   [] Shortness of breath when laying flat   [] Shortness of breath at rest   [x] Shortness of breath with exertion. Vascular:  [] Pain in legs with walking   [] Pain in legs at rest   [] Pain in legs when laying flat   [] Claudication   [] Pain in feet when walking  [] Pain in feet at rest  [] Pain in feet when laying flat   [] History of DVT   [] Phlebitis   [] Swelling in legs   [] Varicose veins   [x] Non-healing ulcers Pulmonary:   [] Uses home oxygen   [] Productive cough   [] Hemoptysis   [] Wheeze  [] COPD   [] Asthma Neurologic:  [] Dizziness  [] Blackouts    [] Seizures   [] History of stroke   [] History of TIA  [] Aphasia   [] Temporary blindness   [] Dysphagia   [] Weakness or numbness in arms   [] Weakness or numbness in legs Musculoskeletal:  [x] Arthritis   [] Joint swelling   [x] Joint pain   [] Low back pain Hematologic:  [] Easy bruising  [] Easy bleeding   [] Hypercoagulable state   [x] Anemic  [] Hepatitis Gastrointestinal:  [] Blood in stool   [] Vomiting blood  [] Gastroesophageal reflux/heartburn   [] Difficulty swallowing. Genitourinary:  [x] Chronic kidney disease   [] Difficult urination  [] Frequent urination  [] Burning with urination   [] Blood in urine Skin:  [] Rashes   [x] Ulcers   [x] Wounds Psychological:  [] History of anxiety   []  History of major depression.  Physical Examination  Vitals:   08/02/22 1312  BP: 108/66  Pulse: 75  Resp: 18  Temp: 97.6 F (36.4 C)  TempSrc: Oral  SpO2: 100%  Weight: 135.6 kg  Height: 5\' 4"  (1.626 m)   Body mass index is 51.32 kg/m. Gen: WD/WN, NAD Head: [place]/AT, No temporalis wasting. Ear/Nose/Throat: Hearing grossly intact, nares w/o erythema or drainage, oropharynx w/o Erythema/Exudate,  Eyes: Conjunctiva clear, sclera non-icteric Neck: Trachea midline.  No JVD.  Pulmonary:  Good air movement, respirations not labored, no use of accessory muscles.  Cardiac: RRR, normal S1, S2. Vascular: good thrill in AVG, permcath in place without erythema or drainage. Vessel Right Left  Radial Palpable Palpable   Musculoskeletal: M/S 5/5 throughout.  Extremities without ischemic changes.  No deformity or atrophy.  Neurologic: Sensation grossly intact in extremities.  Symmetrical.  Speech is fluent. Motor exam as listed above. Psychiatric: Judgment intact, Mood & affect appropriate for pt's clinical situation.    CBC Lab Results  Component Value Date   WBC 6.6 07/26/2022   HGB 8.8 (L) 07/26/2022   HCT 30.5 (L) 07/26/2022   MCV 99.3 07/26/2022   PLT 208 07/26/2022    BMET    Component Value Date/Time   NA  135 07/26/2022 1506   K 5.3 (H) 07/26/2022 1506   CL 99 07/26/2022 1506   CO2 29 07/26/2022 1506   GLUCOSE 160 (H) 07/26/2022 1506   BUN 22 07/26/2022 1506   CREATININE 4.38 (H) 07/26/2022 1506   CALCIUM 8.7 (L) 07/26/2022 1506   GFRNONAA 10 (L) 07/26/2022 1506   Estimated Creatinine Clearance: 16.4 mL/min (A) (by C-G formula based on SCr of 4.38 mg/dL (H)).  COAG Lab Results  Component Value Date   INR 1.2 07/22/2022   INR 1.8 (H) 03/02/2022    Radiology VAS US DUPLEX DIALYSIS ACCESS (AVF, AVG)  Result Date: 07/26/2022 DIALYSIS ACCESS Patient Name:  LUTIE SAWADA  Date of Exam:   07/20/2022 Medical Rec #: [id number]    Accession #:    [id number] Date of Birth: 1950/08/31    Patient Gender: F Patient Age:   29 years Exam Location:  [facility] Procedure:      VAS US DUPLEX DIALYSIS ACCESS (AVF, AVG) Referring Phys: Duard Brady --------------------------------------------------------------------------------  Reason for Exam: Routine follow up. Access Site: Left Upper Extremity. Access Type: BrachAx. History: 04/16/2022 New Left Brach Ax AVG. Comparison Study: 05/27/2022 Performing Technologist: Concha Norway RVT  Examination Guidelines: A complete evaluation includes B-mode imaging, spectral Doppler, color Doppler, and power Doppler as needed of all accessible portions of each vessel. Unilateral testing is considered an integral part of a complete examination. Limited examinations for reoccurring indications may be performed as noted.  Findings:   +--------------------+----------+-----------------+-------------------+ AVG                 PSV (cm/s)Flow Vol (mL/min)     Describe       +--------------------+----------+-----------------+-------------------+ Native artery inflow   210          1370                           +--------------------+----------+-----------------+-------------------+ Arterial anastomosis   323                                          +--------------------+----------+-----------------+-------------------+ Prox graft             193                     .  12cm narrowed grft +--------------------+----------+-----------------+-------------------+ Mid graft              123                                         +--------------------+----------+-----------------+-------------------+ Distal graft           144                                         +--------------------+----------+-----------------+-------------------+ Venous anastomosis     183                                         +--------------------+----------+-----------------+-------------------+ Venous outflow         152                                         +--------------------+----------+-----------------+-------------------+ +--------------+------------+----------+---------+---------+-------------------+                 Diameter  Depth (cm)Branching   PSV       Flow Volume                       (cm)                        (cm/s)       (ml/min)       +--------------+------------+----------+---------+---------+-------------------+ Lt Rad Art Dis                                  26                        +--------------+------------+----------+---------+---------+-------------------+ antegrade                                                                 +--------------+------------+----------+---------+---------+-------------------+  Summary: Patent left BrachAx AVG with a small vessel diameter seen (.12cm) in the proximal graft just distal to the anastomosis site.  *See table(s) above for measurements and observations.  Diagnosing physician: Leotis Pain MD Electronically signed by Leotis Pain MD on 07/26/2022 at 12:49:52 PM.   --------------------------------------------------------------------------------   Final    US Carotid Bilateral  Result Date: 07/26/2022 CLINICAL DATA:  Stroke. History of hypertension, hyperlipidemia and  diabetes. Former smoker. EXAM: BILATERAL CAROTID DUPLEX ULTRASOUND TECHNIQUE: Pearline Cables scale imaging, color Doppler and duplex ultrasound were performed of bilateral carotid and vertebral arteries in the neck. COMPARISON:  None Available. FINDINGS: Criteria: Quantification of carotid stenosis is based on velocity parameters that correlate the residual internal carotid diameter with NASCET-based stenosis levels, using the diameter of the distal internal carotid lumen as the denominator for stenosis measurement. The following velocity measurements were obtained: RIGHT ICA: 63/9 cm/sec CCA: 123XX123 cm/sec SYSTOLIC ICA/CCA RATIO:  0.8 ECA: 64 cm/sec  LEFT ICA: 131/25 cm/sec CCA: 0000000 cm/sec SYSTOLIC ICA/CCA RATIO:  1.7 ECA: 52 cm/sec RIGHT CAROTID ARTERY: There is a minimal amount of eccentric echogenic plaque within the right carotid bulb (images 5 and 19), extending to involve the origin and proximal aspects of the right internal carotid artery (image 26, not resulting in elevated peak systolic velocities within the interrogated course of the right internal carotid artery to suggest a hemodynamically significant stenosis. RIGHT VERTEBRAL ARTERY:  Antegrade flow LEFT CAROTID ARTERY: There is a minimal amount of eccentric echogenic plaque involving the origin and proximal aspects of the left internal carotid artery (image 62), which results in elevated peak systolic velocities within mid aspect of the left internal carotid artery, potentially accentuated due to vessel tortuosity. Greatest acquired peak systolic velocity within the mid left ICA measures 131 centimeters/seconds (image 68). LEFT VERTEBRAL ARTERY:  Antegrade flow IMPRESSION: 1. Minimal amount of left-sided atherosclerotic plaque results in elevated peak systolic velocities within the left internal carotid artery compatible with the 50-69% luminal narrowing range, potentially accentuated due to vessel tortuosity. Further evaluation with CTA could be performed as  clinically indicated. 2. Minimal amount of right-sided atherosclerotic plaque, not resulting in a hemodynamically significant stenosis. Electronically Signed   By: Sandi Mariscal M.D.   On: 07/26/2022 05:30   ECHOCARDIOGRAM COMPLETE  Result Date: 07/23/2022    ECHOCARDIOGRAM REPORT   Patient Name:   JAEDAH MCPHERON Date of Exam: 07/23/2022 Medical Rec #:  [id number]   Height:       64.0 in Accession #:    [id number]  Weight:       297.4 lb Date of Birth:  1950/12/07   BSA:          2.316 m Patient Age:    89 years    BP:           131/47 mmHg Patient Gender: F           HR:           83 bpm. Exam Location:  [facility] Procedure: 2D Echo, Cardiac Doppler and Color Doppler Indications:     Stroke  History:         Patient has no prior history of Echocardiogram examinations.                  CHF, Stroke, Signs/Symptoms:Chest Pain and Dyspnea; Risk                  Factors:Hypertension, Diabetes, Dyslipidemia and Sleep Apnea.  Sonographer:     Wenda Low Referring Phys:  [id number] Athena Masse Diagnosing Phys: Ida Rogue MD  Sonographer Comments: Technically difficult study due to poor echo windows and patient is obese. Image acquisition challenging due to respiratory motion. IMPRESSIONS  1. Left ventricular ejection fraction, by estimation, is 60 to 65%. The left ventricle has normal function. The left ventricle has no regional wall motion abnormalities. There is mild left ventricular hypertrophy. Left ventricular diastolic parameters are consistent with Grade I diastolic dysfunction (impaired relaxation).  2. Right ventricular systolic function is normal. The right ventricular size is normal.  3. The mitral valve is normal in structure. No evidence of mitral valve regurgitation. No evidence of mitral stenosis.  4. The aortic valve has an indeterminant number of cusps. Aortic valve regurgitation is not visualized. Aortic valve sclerosis/calcification is present, without any evidence of aortic stenosis. Aortic valve mean  gradient measures 5.0 mmHg.  5. The inferior vena cava is normal in size with  greater than 50% respiratory variability, suggesting right atrial pressure of 3 mmHg. FINDINGS  Left Ventricle: Left ventricular ejection fraction, by estimation, is 60 to 65%. The left ventricle has normal function. The left ventricle has no regional wall motion abnormalities. The left ventricular internal cavity size was normal in size. There is  mild left ventricular hypertrophy. Left ventricular diastolic parameters are consistent with Grade I diastolic dysfunction (impaired relaxation). Right Ventricle: The right ventricular size is normal. No increase in right ventricular wall thickness. Right ventricular systolic function is normal. Left Atrium: Left atrial size was normal in size. Right Atrium: Right atrial size was normal in size. Pericardium: Trivial pericardial effusion is present. Mitral Valve: The mitral valve is normal in structure. No evidence of mitral valve regurgitation. No evidence of mitral valve stenosis. MV peak gradient, 5.6 mmHg. The mean mitral valve gradient is 2.0 mmHg. Tricuspid Valve: The tricuspid valve is normal in structure. Tricuspid valve regurgitation is trivial. No evidence of tricuspid stenosis. Aortic Valve: The aortic valve has an indeterminant number of cusps. Aortic valve regurgitation is not visualized. Aortic valve sclerosis/calcification is present, without any evidence of aortic stenosis. Aortic valve mean gradient measures 5.0 mmHg. Aortic valve peak gradient measures 9.4 mmHg. Aortic valve area, by VTI measures 2.35 cm. Pulmonic Valve: The pulmonic valve was normal in structure. Pulmonic valve regurgitation is not visualized. No evidence of pulmonic stenosis. Aorta: The aortic root is normal in size and structure. Venous: The inferior vena cava is normal in size with greater than 50% respiratory variability, suggesting right atrial pressure of 3 mmHg. IAS/Shunts: No atrial level shunt  detected by color flow Doppler.  LEFT VENTRICLE PLAX 2D LVIDd:         3.70 cm   Diastology LVIDs:         2.30 cm   LV e' medial:    8.27 cm/s LV PW:         1.10 cm   LV E/e' medial:  12.7 LV IVS:        1.20 cm   LV e' lateral:   10.60 cm/s LVOT diam:     2.00 cm   LV E/e' lateral: 9.9 LV SV:         83 LV SV Index:   36 LVOT Area:     3.14 cm  RIGHT VENTRICLE RV Basal diam:  4.10 cm RV Mid diam:    3.30 cm RV S prime:     17.20 cm/s TAPSE (M-mode): 2.4 cm LEFT ATRIUM             Index        RIGHT ATRIUM           Index LA diam:        3.20 cm 1.38 cm/m   RA Area:     18.40 cm LA Vol (A2C):   72.1 ml 31.14 ml/m  RA Volume:   59.20 ml  25.56 ml/m LA Vol (A4C):   75.1 ml 32.43 ml/m LA Biplane Vol: 78.9 ml 34.07 ml/m  AORTIC VALVE                     PULMONIC VALVE AV Area (Vmax):    2.28 cm      PV Vmax:       1.05 m/s AV Area (Vmean):   2.22 cm      PV Peak grad:  4.4 mmHg AV Area (VTI):     2.35 cm AV Vmax:  153.00 cm/s AV Vmean:          106.000 cm/s AV VTI:            0.355 m AV Peak Grad:      9.4 mmHg AV Mean Grad:      5.0 mmHg LVOT Vmax:         111.00 cm/s LVOT Vmean:        74.900 cm/s LVOT VTI:          0.265 m LVOT/AV VTI ratio: 0.75  AORTA Ao Root diam: 2.80 cm MITRAL VALVE MV Area (PHT): 3.23 cm     SHUNTS MV Area VTI:   3.00 cm     Systemic VTI:  0.26 m MV Peak grad:  5.6 mmHg     Systemic Diam: 2.00 cm MV Mean grad:  2.0 mmHg MV Vmax:       1.18 m/s MV Vmean:      71.0 cm/s MV Decel Time: 235 msec MV E velocity: 105.00 cm/s MV A velocity: 85.90 cm/s MV E/A ratio:  1.22 Ida Rogue MD Electronically signed by Ida Rogue MD Signature Date/Time: 07/23/2022/4:20:27 PM    Final    MR BRAIN WO CONTRAST  Result Date: 07/22/2022 CLINICAL DATA:  Acute neurologic deficit EXAM: MRI HEAD WITHOUT CONTRAST TECHNIQUE: Multiplanar, multiecho pulse sequences of the brain and surrounding structures were obtained without intravenous contrast. COMPARISON:  Brain MRI 03/02/2022 FINDINGS:  Brain: Small acute infarct of the right centrum semiovale. No acute hemorrhage or mass effect. Numerous chronic microhemorrhages in a mixed central and peripheral distribution. 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 Normal white matter signal, parenchymal volume and CSF spaces. The midline structures are normal. Vascular: Normal flow voids. Skull and upper cervical spine: Normal marrow signal. Sinuses/Orbits: Negative. Other: None. IMPRESSION: 1. Small acute infarct of the right centrum semiovale. 2. Numerous chronic microhemorrhages in a mixed central and peripheral distribution, which could be due to chronic hypertensive or cerebral amyloid angiopathy. 3. 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 Electronically Signed   By: Ulyses Jarred M.D.   On: 07/22/2022 23:38   CT HEAD CODE STROKE WO CONTRAST  Result Date: 07/22/2022 CLINICAL DATA:  Code stroke.  Left-sided weakness EXAM: CT HEAD WITHOUT CONTRAST TECHNIQUE: Contiguous axial images were obtained from the base of the skull through the vertex without intravenous contrast. RADIATION DOSE REDUCTION: This exam was performed according to the departmental dose-optimization program which includes automated exposure control, adjustment of the mA and/or kV according to patient size and/or use of iterative reconstruction technique. COMPARISON:  05/01/2022 FINDINGS: Brain: There is no mass, hemorrhage or extra-axial collection. The size and configuration of the ventricles and extra-axial CSF spaces are normal. 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 Vascular: No abnormal hyperdensity of the major intracranial arteries or dural venous sinuses. No intracranial atherosclerosis. Skull: The visualized skull base, calvarium and extracranial soft tissues are normal. Sinuses/Orbits: No fluid levels or advanced mucosal thickening of the visualized paranasal sinuses. No mastoid or middle ear effusion. The orbits are  normal. ASPECTS [facility] Stroke Program Early CT Score) - Ganglionic level infarction (caudate, lentiform nuclei, internal capsule, insula, M1-M3 cortex): 7 - Supraganglionic infarction (M4-M6 cortex): 3 Total score (0-10 with 10 being normal): 10 IMPRESSION: 1. No acute intracranial abnormality. 2. 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 3. ASPECTS is 10. These results were called by telephone at the time of interpretation on 07/22/2022 at 8:01 pm  to provider Valora Piccolo , who verbally acknowledged these results. Electronically Signed   By: Ulyses Jarred M.D.   On: 07/22/2022 20:01    Assessment/Plan 1.  Complication dialysis device:  Patient's Tunneled catheter is not being used. The patient has an extremity access that is functioning well. Therefore, the patient will undergo removal of the tunneled catheter under local anesthesia.  The risks and benefits were described to the patient.  All questions were answered.  The patient agrees to proceed with angiography and intervention. Potassium will be drawn to ensure that it is an appropriate level prior to performing intervention. 2.  End-stage renal disease requiring hemodialysis:  Patient will continue dialysis therapy without further interruption  3.  Hypertension:  Patient will continue medical management; nephrology is following no changes in oral medications. 4. Diabetes mellitus:  Glucose will be monitored and oral medications been held this morning once the patient has undergone the patient's procedure po intake will be reinitiated and again Accu-Cheks will be used to assess the blood glucose level and treat as needed. The patient will be restarted on the patient's usual hypoglycemic regime 5.  Coronary artery disease:  EKG will be monitored. Nitrates will be used if needed. The patient's oral cardiac medications will be continued.    Leotis Pain, MD  08/02/2022 1:30 PM

## 2022-08-02 NOTE — Discharge Instructions (Signed)
Tunneled Catheter Removal, Care After Refer to this sheet in the next few weeks. These instructions provide you with information about caring for yourself after your procedure. Your health care provider may also give you more specific instructions. Your treatment has been planned according to current medical practices, but problems sometimes occur. Call your health care provider if you have any problems or questions after your procedure. What can I expect after the procedure? After the procedure, it is common to have: Some mild redness, swelling, and pain around your catheter site.   Follow these instructions at home: Incision care  Check your removal site  every day for signs of infection. Check for: More redness, swelling, or pain. More fluid or blood. Warmth. Pus or a bad smell. Remove your dressing in 48hrs leave open to air  Activity  Return to your normal activities as told by your health care provider. Ask your health care provider what activities are safe for you. Do not lift anything that is heavier than 10 lb (4.5 kg) for 3 days  You may shower tomorrow  Contact a health care provider if: You have more fluid or blood coming from your removal site You have more redness, swelling, or pain at your incisions or around the area where your catheter was removed Your removal site feel warm to the touch. You feel unusually weak. You feel nauseous.. Get help right away if You have swelling in your arm, shoulder, neck, or face. You develop chest pain. You have difficulty breathing. You feel dizzy or light-headed. You have pus or a bad smell coming from your removal site You have a fever. You develop bleeding from your removal site, and your bleeding does not stop. This information is not intended to replace advice given to you by your health care provider. Make sure you discuss any questions you have with your health care provider. Document Released: 04/12/2012 Document Revised:  12/28/2015 Document Reviewed: 01/20/2015 Elsevier Interactive Patient Education  2017 [employer] 

## 2022-08-02 NOTE — Op Note (Signed)
Operative Note     Preoperative diagnosis:   1. ESRD with functional permanent access  Postoperative diagnosis:  1. ESRD with functional permanent access  Procedure:  Removal of right jugular Permcath  Surgeon:  Leotis Pain, MD  Assistant: Annalee Genta, NP  Anesthesia:  Local  EBL:  Minimal  Indication for the Procedure:  The patient has a functional permanent dialysis access and no longer needs their permcath.  This can be removed.  Risks and benefits are discussed and informed consent is obtained.  Description of the Procedure:  The patient's right neck, chest and existing catheter were sterilely prepped and draped. The area around the catheter was anesthetized copiously with 1% lidocaine. The catheter was dissected out with curved hemostats until the cuff was freed from the surrounding fibrous sheath. The fiber sheath was transected, and the catheter was then removed in its entirety using gentle traction. Pressure was held and sterile dressings were placed. The patient tolerated the procedure well and was taken to the recovery room in stable condition.     Leotis Pain  08/02/2022, 2:25 PM This note was created with Dragon Medical transcription system. Any errors in dictation are purely unintentional.

## 2022-08-05 ENCOUNTER — Encounter (INDEPENDENT_AMBULATORY_CARE_PROVIDER_SITE_OTHER): Payer: Medicaid Other

## 2022-08-05 ENCOUNTER — Ambulatory Visit (INDEPENDENT_AMBULATORY_CARE_PROVIDER_SITE_OTHER): Payer: Medicaid Other | Admitting: Vascular Surgery

## 2022-09-15 NOTE — Progress Notes (Deleted)
MRN : [id number]  Jody Nelson is a 72 y.o. ([birth year]) female who presents with chief complaint of check access.  History of Present Illness:    The patient is seen for evaluation of dialysis access.  The patient has a history of multiple failed accesses.  There have been accesses in both arms which are nonfunctioning.    Current access is via a catheter which is functioning poorly the flow rates have been less than ideal.  There have not been multiple episodes of catheter infection.  The patient denies fever and chills while on dialysis.  No tenderness or drainage at the exit site.  No recent shortening of the patient's walking distance or new symptoms consistent with claudication.  No history of rest pain symptoms. No new ulcers or wounds of the lower extremities have occurred.  The patient denies amaurosis fugax or recent TIA symptoms. There are no recent neurological changes noted. There is no history of DVT, PE or superficial thrombophlebitis. No recent episodes of angina or shortness of breath documented.   No outpatient medications have been marked as taking for the 09/16/22 encounter (Appointment) with Gilda Crease, Latina Craver, MD.    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   At high risk for falls    Atypical chest pain    Bilateral lower extremity edema    Chronic right sided weakness    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   a.) MRI brain 02/27/2011: old lacunar infarct in RIGHT pons and medulla oblongata    Cryptogenic stroke (HCC) 10/11/2012   a.) CT brain 10/11/2012: tiny old lacunar infarct in head of caudate nucleus (left)   Cryptogenic stroke (HCC) 10/12/2012   a.) MRI brain 10/12/2012: multiple acute LEFT hemispheric infarcts   Cryptogenic stroke (HCC) 06/26/2014   a.) MRI brain 06/26/2014: acute RIGHT posterior frontal lobe infarct involving both and grey matter   Cryptogenic stroke (HCC) 07/29/2015   a.) MRI brain 07/29/2015: 2 foci of diffusion restriction in the LEFT parietal lobe consistent with acute infarction   Cryptogenic stroke (HCC) 07/24/2017   a.) MRI brain 07/24/2017: punctate focus of acute ischemia in the medial LEFT temporal lobe   DDD (degenerative disc disease), cervical    Depression    DOE (dyspnea on exertion)    Dysarthria as late effect of stroke    ESRD (end stage renal disease) on dialysis (HCC)    a.) M-W-F   GERD (gastroesophageal reflux disease)    Hallux valgus, bilateral    Heterozygous factor V Leiden mutation (HCC) 07/18/2018   History of cardiac catheterization 08/30/2008   a.) LHC 08/30/2008: EF >60%, LVEDP 18 mmHg, normal coronaries.   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  a.) 07/18/2018 --> 22 mol/L   Hypertension    Insomnia    a.) melatonin + trazodone PRN   Long term current use of anticoagulant    a.) apixaban   Lymphedema of both lower extremities    Meralgia paresthetica, left lower limb    OSA (obstructive sleep apnea)    a.) does not require nocturnal PAP therapy   Osteoarthritis    Pneumonia    Pseudophakia of both eyes    Sarcoidosis    Secondary hyperparathyroidism of renal origin (HCC)    Type 2 diabetes mellitus treated with insulin (HCC)    Vitamin D deficiency      Past Surgical History:  Procedure Laterality Date   APPLICATION OF WOUND VAC Right 06/15/2022   Procedure: APPLICATION OF WOUND VAC;  Surgeon: Signa Kell, MD;  Location: [facility];  Service: Orthopedics;  Laterality: Right;   AV FISTULA PLACEMENT Left 04/16/2022   Procedure: INSERTION OF ARTERIOVENOUS (AV) GORE-TEX GRAFT ARM (BRACHIAL AXILLARY);  Surgeon: Renford Dills, MD;  Location: [facility];  Service: Vascular;  Laterality: Left;   CESAREAN SECTION     DIALYSIS/PERMA CATHETER INSERTION N/A 10/14/2021   Procedure: DIALYSIS/PERMA CATHETER INSERTION;  Surgeon: Annice Needy, MD;  Location: [facility];  Service: Cardiovascular;  Laterality: N/A;   DIALYSIS/PERMA CATHETER REMOVAL N/A 08/02/2022   Procedure: DIALYSIS/PERMA CATHETER REMOVAL;  Surgeon: Annice Needy, MD;  Location: [facility];  Service: Cardiovascular;  Laterality: N/A;   FRACTURE SURGERY     leg right fracture several surgeries, rods   INCISION AND DRAINAGE OF WOUND Right 06/15/2022   Procedure: IRRIGATION AND DEBRIDEMENT WOUND;  Surgeon: Signa Kell, MD;  Location: [facility];  Service: Orthopedics;  Laterality: Right;    Social History Social History   Tobacco Use   Smoking status: Former    Types: Cigarettes   Smokeless tobacco: Never  Substance Use Topics   Alcohol use: Never   Drug use: Never    Family History Family History  Problem Relation Age of Onset   Diabetes Mother    Colon cancer Sister    Diabetes Sister    Cancer Maternal Uncle    Clotting disorder Paternal Grandmother    Colon cancer Paternal Grandfather     Allergies  Allergen Reactions   Sulfamethoxazole-Trimethoprim     Other reaction(s): Kidney Disorder Other reaction(s): Kidney Disorder Hyperkalemia, AKI Hyperkalemia, AKI      REVIEW OF SYSTEMS (Negative unless checked)  Constitutional: [] Weight loss  [] Fever  [] Chills Cardiac: [] Chest pain   [] Chest pressure   [] Palpitations   [] Shortness of breath when  laying flat   [] Shortness of breath with exertion. Vascular:  [] Pain in legs with walking   [] Pain in legs at rest  [] History of DVT   [] Phlebitis   [] Swelling in legs   [] Varicose veins   [] Non-healing ulcers Pulmonary:   [] Uses home oxygen   [] Productive cough   [] Hemoptysis   [] Wheeze  [] COPD   [] Asthma Neurologic:  [] Dizziness   [] Seizures   [] History of stroke   [] History of TIA  [] Aphasia   [] Vissual changes   [] Weakness or numbness in arm   [] Weakness or numbness in leg Musculoskeletal:   [] Joint swelling   [] Joint pain   [] Low back pain Hematologic:  [] Easy bruising  [] Easy bleeding   [] Hypercoagulable state   [] Anemic Gastrointestinal:  [] Diarrhea   [] Vomiting  [] Gastroesophageal reflux/heartburn   [] Difficulty swallowing. Genitourinary:  [x] Chronic kidney disease   [] Difficult urination  []   Frequent urination   [] Blood in urine Skin:  [] Rashes   [] Ulcers  Psychological:  [] History of anxiety   []  History of major depression.  Physical Examination  There were no vitals filed for this visit. There is no height or weight on file to calculate BMI. Gen: WD/WN, NAD Head: [place]/AT, No temporalis wasting.  Ear/Nose/Throat: Hearing grossly intact, nares w/o erythema or drainage Eyes: PER, EOMI, sclera nonicteric.  Neck: Supple, no gross masses or lesions.  No JVD.  Pulmonary:  Good air movement, no audible wheezing, no use of accessory muscles.  Cardiac: RRR, precordium non-hyperdynamic. Vascular:   *** Vessel Right Left  Radial Palpable Palpable  Brachial Palpable Palpable  Gastrointestinal: soft, non-distended. No guarding/no peritoneal signs.  Musculoskeletal: M/S 5/5 throughout.  No deformity.  Neurologic: CN 2-12 intact. Pain and light touch intact in extremities.  Symmetrical.  Speech is fluent. Motor exam as listed above. Psychiatric: Judgment intact, Mood & affect appropriate for pt's clinical situation. Dermatologic: No rashes or ulcers noted.  No changes consistent with  cellulitis.   CBC Lab Results  Component Value Date   WBC 6.6 07/26/2022   HGB 8.8 (L) 07/26/2022   HCT 30.5 (L) 07/26/2022   MCV 99.3 07/26/2022   PLT 208 07/26/2022    BMET    Component Value Date/Time   NA 135 07/26/2022 1506   K 5.3 (H) 07/26/2022 1506   CL 99 07/26/2022 1506   CO2 29 07/26/2022 1506   GLUCOSE 160 (H) 07/26/2022 1506   BUN 22 07/26/2022 1506   CREATININE 4.38 (H) 07/26/2022 1506   CALCIUM 8.7 (L) 07/26/2022 1506   GFRNONAA 10 (L) 07/26/2022 1506   CrCl cannot be calculated (Patient's most recent lab result is older than the maximum 21 days allowed.).  COAG Lab Results  Component Value Date   INR 1.2 07/22/2022   INR 1.8 (H) 03/02/2022    Radiology No results found.   Assessment/Plan There are no diagnoses linked to this encounter.   Levora Dredge, MD  09/15/2022 9:22 PM

## 2022-09-16 ENCOUNTER — Ambulatory Visit (INDEPENDENT_AMBULATORY_CARE_PROVIDER_SITE_OTHER): Payer: 59 | Admitting: Vascular Surgery

## 2022-09-16 DIAGNOSIS — E1165 Type 2 diabetes mellitus with hyperglycemia: Secondary | ICD-10-CM

## 2022-09-16 DIAGNOSIS — Z992 Dependence on renal dialysis: Secondary | ICD-10-CM

## 2022-09-16 DIAGNOSIS — D6851 Activated protein C resistance: Secondary | ICD-10-CM

## 2022-09-16 DIAGNOSIS — I10 Essential (primary) hypertension: Secondary | ICD-10-CM

## 2022-09-16 DIAGNOSIS — I890 Lymphedema, not elsewhere classified: Secondary | ICD-10-CM

## 2022-11-29 ENCOUNTER — Encounter (INDEPENDENT_AMBULATORY_CARE_PROVIDER_SITE_OTHER): Payer: Self-pay | Admitting: Vascular Surgery

## 2022-11-29 ENCOUNTER — Ambulatory Visit (INDEPENDENT_AMBULATORY_CARE_PROVIDER_SITE_OTHER): Payer: 59 | Admitting: Vascular Surgery

## 2022-11-29 VITALS — BP 127/76 | HR 85 | Resp 16 | Wt 279.0 lb

## 2022-11-29 DIAGNOSIS — I10 Essential (primary) hypertension: Secondary | ICD-10-CM

## 2022-11-29 DIAGNOSIS — N186 End stage renal disease: Secondary | ICD-10-CM | POA: Diagnosis not present

## 2022-11-29 DIAGNOSIS — E119 Type 2 diabetes mellitus without complications: Secondary | ICD-10-CM

## 2022-11-29 DIAGNOSIS — E785 Hyperlipidemia, unspecified: Secondary | ICD-10-CM

## 2022-11-29 DIAGNOSIS — Z992 Dependence on renal dialysis: Secondary | ICD-10-CM

## 2022-11-29 NOTE — Progress Notes (Signed)
MRN : [id number]  Jody Nelson is a 72 y.o. ([birth year]) female who presents with chief complaint of legs hurt and swell.  History of Present Illness:   The patient returns to the office for followup of their dialysis access.  She is also stating that there is a retained stitch where her tunneled catheter used to be.  The patient reports the function of the access has been stable. Patient denies difficulty with cannulation. The patient denies increased bleeding time after removing the needles. The patient denies hand pain or other symptoms consistent with steal phenomena.  No significant arm swelling.  The patient denies any complaints from the dialysis center or their nephrologist.  The patient denies redness or swelling at the access site. The patient denies fever or chills at home or while on dialysis.  No recent shortening of the patient's walking distance or new symptoms consistent with claudication.  No history of rest pain symptoms. No new ulcers or wounds of the lower extremities have occurred.  The patient denies amaurosis fugax or recent TIA symptoms. There are no recent neurological changes noted. There is no history of DVT, PE or superficial thrombophlebitis. No recent episodes of angina or shortness of breath documented.   Duplex ultrasound of the AV access shows a patent access.  The previously noted stenosis is not significantly changed compared to last study.     No outpatient medications have been marked as taking for the 11/29/22 encounter (Appointment) with Gilda Crease, Latina Craver, MD.    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   At high risk for falls    Atypical chest pain    Bilateral lower extremity edema    Chronic right sided weakness    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   a.) MRI brain 02/27/2011: old lacunar infarct in RIGHT pons and medulla oblongata   Cryptogenic stroke (HCC) 10/11/2012   a.) CT brain 10/11/2012: tiny old lacunar infarct in head of caudate nucleus (left)   Cryptogenic stroke (HCC) 10/12/2012   a.) MRI brain 10/12/2012: multiple acute LEFT hemispheric infarcts   Cryptogenic stroke (HCC) 06/26/2014   a.) MRI brain 06/26/2014: acute RIGHT posterior frontal lobe infarct involving both and grey matter   Cryptogenic stroke (HCC) 07/29/2015   a.) MRI brain 07/29/2015: 2 foci of diffusion restriction in the LEFT parietal lobe consistent with acute infarction   Cryptogenic stroke (HCC) 07/24/2017   a.) MRI brain 07/24/2017: punctate focus of acute ischemia in the medial LEFT temporal lobe   DDD (degenerative disc disease), cervical    Depression    DOE (dyspnea on exertion)    Dysarthria as late effect of stroke    ESRD (end stage renal disease) on dialysis (HCC)    a.) M-W-F   GERD (gastroesophageal reflux disease)    Hallux valgus, bilateral    Heterozygous factor V Leiden mutation (HCC) 07/18/2018   History of cardiac catheterization 08/30/2008   a.) LHC 08/30/2008: EF >60%, LVEDP 18 mmHg, normal coronaries.   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   a.) 07/18/2018 --> 22 mol/L   Hypertension    Insomnia    a.) melatonin + trazodone PRN   Long term current use of anticoagulant    a.) apixaban   Lymphedema of both lower  extremities    Meralgia paresthetica, left lower limb    OSA (obstructive sleep apnea)    a.) does not require nocturnal PAP therapy   Osteoarthritis    Pneumonia    Pseudophakia of both eyes    Sarcoidosis    Secondary hyperparathyroidism of renal origin (HCC)    Type 2 diabetes mellitus treated with insulin (HCC)    Vitamin D deficiency     Past Surgical History:  Procedure Laterality Date   APPLICATION OF WOUND VAC Right 06/15/2022   Procedure: APPLICATION OF WOUND VAC;  Surgeon: Signa Kell, MD;  Location: [facility];  Service: Orthopedics;  Laterality: Right;   AV FISTULA PLACEMENT Left 04/16/2022   Procedure: INSERTION OF ARTERIOVENOUS (AV) GORE-TEX GRAFT ARM (BRACHIAL AXILLARY);  Surgeon: Renford Dills, MD;  Location: [facility];  Service: Vascular;  Laterality: Left;   CESAREAN SECTION     DIALYSIS/PERMA CATHETER INSERTION N/A 10/14/2021   Procedure: DIALYSIS/PERMA CATHETER INSERTION;  Surgeon: Annice Needy, MD;  Location: [facility];  Service: Cardiovascular;  Laterality: N/A;   DIALYSIS/PERMA CATHETER REMOVAL N/A 08/02/2022   Procedure: DIALYSIS/PERMA CATHETER REMOVAL;  Surgeon: Annice Needy, MD;  Location: [facility];  Service: Cardiovascular;  Laterality: N/A;   FRACTURE SURGERY     leg right fracture several surgeries, rods   INCISION AND DRAINAGE OF WOUND Right 06/15/2022   Procedure: IRRIGATION AND DEBRIDEMENT WOUND;  Surgeon: Signa Kell, MD;  Location: [facility];  Service: Orthopedics;  Laterality: Right;    Social History Social History   Tobacco Use   Smoking status: Former    Types: Cigarettes   Smokeless tobacco: Never  Substance Use Topics   Alcohol use: Never   Drug use: Never    Family History Family History  Problem Relation Age of Onset   Diabetes Mother    Colon cancer Sister    Diabetes Sister    Cancer Maternal Uncle    Clotting disorder Paternal Grandmother    Colon cancer Paternal Grandfather     Allergies  Allergen  Reactions   Sulfamethoxazole-Trimethoprim     Other reaction(s): Kidney Disorder Other reaction(s): Kidney Disorder Hyperkalemia, AKI Hyperkalemia, AKI      REVIEW OF SYSTEMS (Negative unless checked)  Constitutional: [] Weight loss  [] Fever  [] Chills Cardiac: [] Chest pain   [] Chest pressure   [] Palpitations   [] Shortness of breath when laying flat   [] Shortness of breath with exertion. Vascular:  [] Pain in legs with walking   [x] Pain in legs at rest  [] History of DVT   [] Phlebitis   [x] Swelling in legs   [] Varicose veins   [] Non-healing ulcers Pulmonary:   [] Uses home oxygen   [] Productive cough   [] Hemoptysis   [] Wheeze  [] COPD   [] Asthma Neurologic:  [] Dizziness   [] Seizures   [] History of stroke   [] History of TIA  [] Aphasia   [] Vissual changes   [] Weakness or numbness in arm   [] Weakness or numbness in leg Musculoskeletal:   [] Joint swelling   [] Joint  pain   [] Low back pain Hematologic:  [] Easy bruising  [] Easy bleeding   [] Hypercoagulable state   [] Anemic Gastrointestinal:  [] Diarrhea   [] Vomiting  [] Gastroesophageal reflux/heartburn   [] Difficulty swallowing. Genitourinary:  [] Chronic kidney disease   [] Difficult urination  [] Frequent urination   [] Blood in urine Skin:  [] Rashes   [] Ulcers  Psychological:  [] History of anxiety   []  History of major depression.  Physical Examination  There were no vitals filed for this visit. There is no height or weight on file to calculate BMI. Gen: WD/WN, NAD Head: [place]/AT, No temporalis wasting.  Ear/Nose/Throat: Hearing grossly intact, nares w/o erythema or drainage, pinna without lesions Eyes: PER, EOMI, sclera nonicteric.  Neck: Supple, no gross masses.  No JVD.  Pulmonary:  Good air movement, no audible wheezing, no use of accessory muscles.  Cardiac: RRR, precordium not hyperdynamic. Vascular: The right neck and chest wall are examined and there are no retained stitches.  The left brachial axillary AV graft has a good thrill with good  bruit skin appears clean dry and intact Vessel Right Left  Radial Palpable Palpable  Gastrointestinal: soft, non-distended. No guarding/no peritoneal signs.  Musculoskeletal: M/S 5/5 throughout.  No deformity.  Neurologic: CN 2-12 intact. Pain and light touch intact in extremities.  Symmetrical.  Speech is fluent. Motor exam as listed above. Psychiatric: Judgment intact, Mood & affect appropriate for pt's clinical situation. Dermatologic: Venous rashes no ulcers noted.  No changes consistent with cellulitis. Lymph : No lichenification or skin changes of chronic lymphedema.  CBC Lab Results  Component Value Date   WBC 6.6 07/26/2022   HGB 8.8 (L) 07/26/2022   HCT 30.5 (L) 07/26/2022   MCV 99.3 07/26/2022   PLT 208 07/26/2022    BMET    Component Value Date/Time   NA 135 07/26/2022 1506   K 5.3 (H) 07/26/2022 1506   CL 99 07/26/2022 1506   CO2 29 07/26/2022 1506   GLUCOSE 160 (H) 07/26/2022 1506   BUN 22 07/26/2022 1506   CREATININE 4.38 (H) 07/26/2022 1506   CALCIUM 8.7 (L) 07/26/2022 1506   GFRNONAA 10 (L) 07/26/2022 1506   CrCl cannot be calculated (Patient's most recent lab result is older than the maximum 21 days allowed.).  COAG Lab Results  Component Value Date   INR 1.2 07/22/2022   INR 1.8 (H) 03/02/2022    Radiology No results found.   Assessment/Plan 1. ESRD on hemodialysis [facility]) Recommend:  The patient is doing well and currently has adequate dialysis access. The patient's dialysis center is not reporting any access issues. Flow pattern is stable when compared to the prior ultrasound.  The patient should have a duplex ultrasound of the dialysis access in 6 months. The patient will follow-up with me in the office after each ultrasound   - VAS US DUPLEX DIALYSIS ACCESS (AVF, AVG); Future  2. Essential hypertension Continue antihypertensive medications as already ordered, these medications have been reviewed and there are no changes at this  time.  3. Type 2 diabetes mellitus without complication, unspecified whether long term insulin use (HCC) Continue hypoglycemic medications as already ordered, these medications have been reviewed and there are no changes at this time.  Hgb A1C to be monitored as already arranged by primary service  4. Hyperlipidemia with target LDL less than 70 Continue statin as ordered and reviewed, no changes at this time    Levora Dredge, MD  11/29/2022 1:22 PM

## 2022-12-16 ENCOUNTER — Encounter (INDEPENDENT_AMBULATORY_CARE_PROVIDER_SITE_OTHER): Payer: Self-pay | Admitting: Vascular Surgery

## 2022-12-16 ENCOUNTER — Telehealth (INDEPENDENT_AMBULATORY_CARE_PROVIDER_SITE_OTHER): Payer: Self-pay

## 2022-12-16 ENCOUNTER — Ambulatory Visit (INDEPENDENT_AMBULATORY_CARE_PROVIDER_SITE_OTHER): Payer: 59

## 2022-12-16 ENCOUNTER — Ambulatory Visit (INDEPENDENT_AMBULATORY_CARE_PROVIDER_SITE_OTHER): Payer: 59 | Admitting: Vascular Surgery

## 2022-12-16 VITALS — BP 126/68 | HR 73 | Resp 20 | Ht 64.0 in | Wt 279.0 lb

## 2022-12-16 DIAGNOSIS — E785 Hyperlipidemia, unspecified: Secondary | ICD-10-CM | POA: Diagnosis not present

## 2022-12-16 DIAGNOSIS — Z992 Dependence on renal dialysis: Secondary | ICD-10-CM | POA: Diagnosis not present

## 2022-12-16 DIAGNOSIS — E119 Type 2 diabetes mellitus without complications: Secondary | ICD-10-CM

## 2022-12-16 DIAGNOSIS — I10 Essential (primary) hypertension: Secondary | ICD-10-CM

## 2022-12-16 DIAGNOSIS — D6851 Activated protein C resistance: Secondary | ICD-10-CM

## 2022-12-16 DIAGNOSIS — N186 End stage renal disease: Secondary | ICD-10-CM

## 2022-12-16 NOTE — Progress Notes (Signed)
MRN : [id number]  Shanila Fien is a 72 y.o. (August 13, 1950) female who presents with chief complaint of check access.  History of Present Illness:   The patient returns to the office for follow up regarding a problem with their dialysis access.   The patient notes a significant increase in bleeding time after decannulation.  The patient has also been informed that there is increased recirculation.    The patient denies hand pain or other symptoms consistent with steal phenomena.  No significant arm swelling.  The patient denies redness or swelling at the access site. The patient denies fever or chills at home or while on dialysis.  No recent shortening of the patient's walking distance or new symptoms consistent with claudication.  No history of rest pain symptoms. No new ulcers or wounds of the lower extremities have occurred.  The patient denies amaurosis fugax or recent TIA symptoms. There are no recent neurological changes noted. There is no history of DVT, PE or superficial thrombophlebitis. No recent episodes of angina or shortness of breath documented.   Duplex ultrasound of the AV access shows a patent access.  The previously noted stenosis is significantly increased compared to last study.  Flow volume today is 350 cc/min (previous flow volume was 1370 cc/min)  Current Meds  Medication Sig   acetaminophen (TYLENOL) 325 MG tablet Take 2 tablets by mouth every 8 (eight) hours as needed.   ACIDOPHILUS LACTOBACILLUS PO Take 1 capsule by mouth 3 (three) times daily.   apixaban (ELIQUIS) 5 MG TABS tablet Take 1 tablet (5 mg total) by mouth 2 (two) times daily.   atorvastatin (LIPITOR) 80 MG tablet Take 80 mg by mouth daily.   calcitRIOL (ROCALTROL) 0.25 MCG capsule Take 0.25 mcg by mouth daily.   calcium acetate (PHOSLO) 667 MG capsule Take one capsule by mouth twice daily with food on Mondays, Wednesdays, and Fridays. Take one capsule 3 times daily on Tuesdays,  Thursdays, Saturdays, and Sundays.   Calcium Carb-Cholecalciferol (OYSTER SHELL CALCIUM W/D) 500-5 MG-MCG TABS Take 1 tablet by mouth daily as needed.   carvedilol (COREG) 25 MG tablet Take 1 tablet by mouth 2 (two) times daily with a meal.   diclofenac Sodium (VOLTAREN) 1 % GEL Apply topically.   docusate sodium (COLACE) 100 MG capsule Take 100 mg by mouth 2 (two) times daily.   ferrous sulfate 324 (65 Fe) MG TBEC Take 1 tablet by mouth daily.   folic acid (FOLVITE) 1 MG tablet Take 1 mg by mouth daily.   gabapentin (NEURONTIN) 100 MG capsule Take 2 capsules by mouth 2 (two) times daily.   insulin glargine (LANTUS) 100 UNIT/ML Solostar Pen Inject 10 Units into the skin at bedtime.   insulin lispro (HUMALOG) 100 UNIT/ML injection Inject 2-12 Units into the skin in the morning, at noon, and at bedtime. Per sliding scale   latanoprost (XALATAN) 0.005 % ophthalmic solution Place 1 drop into both eyes at bedtime.   metroNIDAZOLE (METROGEL) 1 % gel Apply topically.   montelukast (SINGULAIR) 10 MG tablet Take 10 mg by mouth daily.   Multiple Vitamins-Minerals (PRESERVISION AREDS) TABS Take 250 mg by mouth 2 (two) times daily.   omeprazole (PRILOSEC) 10 MG capsule Take 10 mg by mouth daily.   polyethylene glycol (MIRALAX / GLYCOLAX) 17 g packet Take 17 g by mouth daily.   torsemide (DEMADEX) 10 MG tablet Take  10 mg by mouth daily.    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   At high risk for falls    Atypical chest pain    Bilateral lower extremity edema     Chronic right sided weakness    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   a.) MRI brain 02/27/2011: old lacunar infarct in RIGHT pons and medulla oblongata   Cryptogenic stroke (HCC) 10/11/2012   a.) CT brain 10/11/2012: tiny old lacunar infarct in head of caudate nucleus (left)   Cryptogenic stroke (HCC) 10/12/2012   a.) MRI brain 10/12/2012: multiple acute LEFT hemispheric infarcts   Cryptogenic stroke (HCC) 06/26/2014   a.) MRI brain 06/26/2014: acute RIGHT posterior frontal lobe infarct involving both and grey matter   Cryptogenic stroke (HCC) 07/29/2015   a.) MRI brain 07/29/2015: 2 foci of diffusion restriction in the LEFT parietal lobe consistent with acute infarction   Cryptogenic stroke (HCC) 07/24/2017   a.) MRI brain 07/24/2017: punctate focus of acute ischemia in the medial LEFT temporal lobe   DDD (degenerative disc disease), cervical    Depression    DOE (dyspnea on exertion)    Dysarthria as late effect of stroke    ESRD (end stage renal disease) on dialysis (HCC)    a.) M-W-F   GERD (gastroesophageal reflux disease)    Hallux valgus, bilateral    Heterozygous factor V Leiden mutation (HCC) 07/18/2018   History of cardiac catheterization 08/30/2008   a.) LHC 08/30/2008: EF >60%, LVEDP 18 mmHg, normal coronaries.   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   a.) 07/18/2018 --> 22 mol/L   Hypertension    Insomnia    a.) melatonin + trazodone PRN   Long term current use of anticoagulant    a.) apixaban   Lymphedema of both lower extremities    Meralgia paresthetica, left lower limb    OSA (obstructive sleep apnea)    a.) does not require nocturnal PAP therapy   Osteoarthritis    Pneumonia    Pseudophakia of both  eyes    Sarcoidosis    Secondary hyperparathyroidism of renal origin (HCC)    Type 2 diabetes mellitus treated with insulin (HCC)    Vitamin D deficiency     Past Surgical History:  Procedure Laterality Date   APPLICATION OF WOUND VAC Right 06/15/2022   Procedure: APPLICATION OF WOUND VAC;  Surgeon: Signa Kell, MD;  Location: [facility];  Service: Orthopedics;  Laterality: Right;   AV FISTULA PLACEMENT Left 04/16/2022   Procedure: INSERTION OF ARTERIOVENOUS (AV) GORE-TEX GRAFT ARM (BRACHIAL AXILLARY);  Surgeon: Renford Dills, MD;  Location: [facility];  Service: Vascular;  Laterality: Left;   CESAREAN SECTION     DIALYSIS/PERMA CATHETER INSERTION N/A 10/14/2021   Procedure: DIALYSIS/PERMA CATHETER INSERTION;  Surgeon: Annice Needy, MD;  Location: [facility];  Service: Cardiovascular;  Laterality: N/A;   DIALYSIS/PERMA CATHETER REMOVAL N/A 08/02/2022   Procedure: DIALYSIS/PERMA CATHETER REMOVAL;  Surgeon: Annice Needy, MD;  Location: [facility];  Service: Cardiovascular;  Laterality: N/A;   FRACTURE SURGERY     leg right fracture several surgeries, rods   INCISION AND DRAINAGE OF WOUND Right 06/15/2022   Procedure: IRRIGATION AND DEBRIDEMENT WOUND;  Surgeon: Signa Kell, MD;  Location: [facility];  Service: Orthopedics;  Laterality: Right;    Social History Social History   Tobacco Use   Smoking status: Former    Types: Cigarettes   Smokeless tobacco: Never  Substance Use Topics   Alcohol use: Never   Drug use: Never    Family History Family History  Problem Relation Age of Onset   Diabetes Mother    Colon cancer Sister    Diabetes Sister    Cancer Maternal Uncle    Clotting disorder Paternal Grandmother    Colon cancer Paternal Grandfather     Allergies  Allergen Reactions   Sulfamethoxazole-Trimethoprim     Other reaction(s): Kidney Disorder Other reaction(s): Kidney Disorder Hyperkalemia, AKI Hyperkalemia, AKI      REVIEW OF SYSTEMS  (Negative unless checked)  Constitutional: [] Weight loss  [] Fever  [] Chills Cardiac: [] Chest pain   [] Chest pressure   [] Palpitations   [] Shortness of breath when laying flat   [] Shortness of breath with exertion. Vascular:  [] Pain in legs with walking   [] Pain in legs at rest  [] History of DVT   [] Phlebitis   [] Swelling in legs   [] Varicose veins   [] Non-healing ulcers Pulmonary:   [] Uses home oxygen   [] Productive cough   [] Hemoptysis   [] Wheeze  [] COPD   [] Asthma Neurologic:  [] Dizziness   [] Seizures   [] History of stroke   [] History of TIA  [] Aphasia   [] Vissual changes   [] Weakness or numbness in arm   [] Weakness or numbness in leg Musculoskeletal:   [] Joint swelling   [] Joint pain   [] Low back pain Hematologic:  [] Easy bruising  [] Easy bleeding   [] Hypercoagulable state   [] Anemic Gastrointestinal:  [] Diarrhea   [] Vomiting  [] Gastroesophageal reflux/heartburn   [] Difficulty swallowing. Genitourinary:  [x] Chronic kidney disease   [] Difficult urination  [] Frequent urination   [] Blood in urine Skin:  [] Rashes   [] Ulcers  Psychological:  [] History of anxiety   []  History of major depression.  Physical Examination  Vitals:   12/16/22 1112  Weight: 279 lb (126.6 kg)   Body mass index is 47.89 kg/m. Gen: WD/WN, NAD Head: [place]/AT, No temporalis wasting.  Ear/Nose/Throat: Hearing grossly intact, nares w/o erythema or drainage Eyes: PER, EOMI, sclera nonicteric.  Neck: Supple, no gross masses or lesions.  No JVD.  Pulmonary:  Good air movement, no audible wheezing, no use of accessory muscles.  Cardiac: RRR, precordium non-hyperdynamic. Vascular:   Left brachial axillary AV graft very weak thrill weak bruit Vessel Right Left  Radial Palpable Palpable  Brachial Palpable Palpable  Gastrointestinal: soft, non-distended. No guarding/no peritoneal signs.  Musculoskeletal: M/S 5/5 throughout.  No deformity.  Neurologic: CN 2-12 intact. Pain and light touch intact in extremities.  Symmetrical.   Speech is fluent. Motor exam as listed above. Psychiatric: Judgment intact, Mood & affect appropriate for pt's clinical situation. Dermatologic: No rashes or ulcers noted.  No changes consistent with cellulitis.   CBC Lab Results  Component Value Date   WBC 6.6 07/26/2022   HGB 8.8 (L) 07/26/2022   HCT 30.5 (L) 07/26/2022  MCV 99.3 07/26/2022   PLT 208 07/26/2022    BMET    Component Value Date/Time   NA 135 07/26/2022 1506   K 5.3 (H) 07/26/2022 1506   CL 99 07/26/2022 1506   CO2 29 07/26/2022 1506   GLUCOSE 160 (H) 07/26/2022 1506   BUN 22 07/26/2022 1506   CREATININE 4.38 (H) 07/26/2022 1506   CALCIUM 8.7 (L) 07/26/2022 1506   GFRNONAA 10 (L) 07/26/2022 1506   CrCl cannot be calculated (Patient's most recent lab result is older than the maximum 21 days allowed.).  COAG Lab Results  Component Value Date   INR 1.2 07/22/2022   INR 1.8 (H) 03/02/2022    Radiology No results found.   Assessment/Plan 1. ESRD on hemodialysis [facility]) Recommend:  The patient is experiencing increasing problems with their dialysis access.  She has had a dramatic reduction in her volume flow compared to her last visit.  She is now only 350 mL/min and is at increased risk for thrombosis.  Patient should have a fistulagram with the intention for intervention.  The intention for intervention is to restore appropriate flow and prevent thrombosis and possible loss of the access.  As well as improve the quality of dialysis therapy.  The risks, benefits and alternative therapies were reviewed in detail with the patient.  All questions were answered.  The patient agrees to proceed with angio/intervention.    The patient will follow up with me in the office after the procedure.   2. Essential hypertension Continue antihypertensive medications as already ordered, these medications have been reviewed and there are no changes at this time.  3. Type 2 diabetes mellitus without complication,  unspecified whether long term insulin use (HCC) Continue hypoglycemic medications as already ordered, these medications have been reviewed and there are no changes at this time.  Hgb A1C to be monitored as already arranged by primary service  4. Hyperlipidemia with target LDL less than 70 Continue statin as ordered and reviewed, no changes at this time  5. Heterozygous factor V Leiden mutation (HCC) Continue anticoagulation as ordered given her hypercoagulable state.    Levora Dredge, MD  12/16/2022 11:16 AM

## 2022-12-16 NOTE — Telephone Encounter (Signed)
Spoke with the receptionist, I will be faxing the patient's procedure paperwork regarding her left arm fistulagram with Dr. Gilda Crease for 12/21/22 with a 12:30 pm arrival time to the [facility].

## 2022-12-16 NOTE — H&P (View-Only) (Signed)
 MRN : [id number]  Jody Nelson is a 72 y.o. (August 13, 1950) female who presents with chief complaint of check access.  History of Present Illness:   The patient returns to the office for follow up regarding a problem with their dialysis access.   The patient notes a significant increase in bleeding time after decannulation.  The patient has also been informed that there is increased recirculation.    The patient denies hand pain or other symptoms consistent with steal phenomena.  No significant arm swelling.  The patient denies redness or swelling at the access site. The patient denies fever or chills at home or while on dialysis.  No recent shortening of the patient's walking distance or new symptoms consistent with claudication.  No history of rest pain symptoms. No new ulcers or wounds of the lower extremities have occurred.  The patient denies amaurosis fugax or recent TIA symptoms. There are no recent neurological changes noted. There is no history of DVT, PE or superficial thrombophlebitis. No recent episodes of angina or shortness of breath documented.   Duplex ultrasound of the AV access shows a patent access.  The previously noted stenosis is significantly increased compared to last study.  Flow volume today is 350 cc/min (previous flow volume was 1370 cc/min)  Current Meds  Medication Sig   acetaminophen (TYLENOL) 325 MG tablet Take 2 tablets by mouth every 8 (eight) hours as needed.   ACIDOPHILUS LACTOBACILLUS PO Take 1 capsule by mouth 3 (three) times daily.   apixaban (ELIQUIS) 5 MG TABS tablet Take 1 tablet (5 mg total) by mouth 2 (two) times daily.   atorvastatin (LIPITOR) 80 MG tablet Take 80 mg by mouth daily.   calcitRIOL (ROCALTROL) 0.25 MCG capsule Take 0.25 mcg by mouth daily.   calcium acetate (PHOSLO) 667 MG capsule Take one capsule by mouth twice daily with food on Mondays, Wednesdays, and Fridays. Take one capsule 3 times daily on Tuesdays,  Thursdays, Saturdays, and Sundays.   Calcium Carb-Cholecalciferol (OYSTER SHELL CALCIUM W/D) 500-5 MG-MCG TABS Take 1 tablet by mouth daily as needed.   carvedilol (COREG) 25 MG tablet Take 1 tablet by mouth 2 (two) times daily with a meal.   diclofenac Sodium (VOLTAREN) 1 % GEL Apply topically.   docusate sodium (COLACE) 100 MG capsule Take 100 mg by mouth 2 (two) times daily.   ferrous sulfate 324 (65 Fe) MG TBEC Take 1 tablet by mouth daily.   folic acid (FOLVITE) 1 MG tablet Take 1 mg by mouth daily.   gabapentin (NEURONTIN) 100 MG capsule Take 2 capsules by mouth 2 (two) times daily.   insulin glargine (LANTUS) 100 UNIT/ML Solostar Pen Inject 10 Units into the skin at bedtime.   insulin lispro (HUMALOG) 100 UNIT/ML injection Inject 2-12 Units into the skin in the morning, at noon, and at bedtime. Per sliding scale   latanoprost (XALATAN) 0.005 % ophthalmic solution Place 1 drop into both eyes at bedtime.   metroNIDAZOLE (METROGEL) 1 % gel Apply topically.   montelukast (SINGULAIR) 10 MG tablet Take 10 mg by mouth daily.   Multiple Vitamins-Minerals (PRESERVISION AREDS) TABS Take 250 mg by mouth 2 (two) times daily.   omeprazole (PRILOSEC) 10 MG capsule Take 10 mg by mouth daily.   polyethylene glycol (MIRALAX / GLYCOLAX) 17 g packet Take 17 g by mouth daily.   torsemide (DEMADEX) 10 MG tablet Take  10 mg by mouth daily.    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   At high risk for falls    Atypical chest pain    Bilateral lower extremity edema     Chronic right sided weakness    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   a.) MRI brain 02/27/2011: old lacunar infarct in RIGHT pons and medulla oblongata   Cryptogenic stroke (HCC) 10/11/2012   a.) CT brain 10/11/2012: tiny old lacunar infarct in head of caudate nucleus (left)   Cryptogenic stroke (HCC) 10/12/2012   a.) MRI brain 10/12/2012: multiple acute LEFT hemispheric infarcts   Cryptogenic stroke (HCC) 06/26/2014   a.) MRI brain 06/26/2014: acute RIGHT posterior frontal lobe infarct involving both and grey matter   Cryptogenic stroke (HCC) 07/29/2015   a.) MRI brain 07/29/2015: 2 foci of diffusion restriction in the LEFT parietal lobe consistent with acute infarction   Cryptogenic stroke (HCC) 07/24/2017   a.) MRI brain 07/24/2017: punctate focus of acute ischemia in the medial LEFT temporal lobe   DDD (degenerative disc disease), cervical    Depression    DOE (dyspnea on exertion)    Dysarthria as late effect of stroke    ESRD (end stage renal disease) on dialysis (HCC)    a.) M-W-F   GERD (gastroesophageal reflux disease)    Hallux valgus, bilateral    Heterozygous factor V Leiden mutation (HCC) 07/18/2018   History of cardiac catheterization 08/30/2008   a.) LHC 08/30/2008: EF >60%, LVEDP 18 mmHg, normal coronaries.   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   a.) 07/18/2018 --> 22 mol/L   Hypertension    Insomnia    a.) melatonin + trazodone PRN   Long term current use of anticoagulant    a.) apixaban   Lymphedema of both lower extremities    Meralgia paresthetica, left lower limb    OSA (obstructive sleep apnea)    a.) does not require nocturnal PAP therapy   Osteoarthritis    Pneumonia    Pseudophakia of both  eyes    Sarcoidosis    Secondary hyperparathyroidism of renal origin (HCC)    Type 2 diabetes mellitus treated with insulin (HCC)    Vitamin D deficiency     Past Surgical History:  Procedure Laterality Date   APPLICATION OF WOUND VAC Right 06/15/2022   Procedure: APPLICATION OF WOUND VAC;  Surgeon: Signa Kell, MD;  Location: [facility] ORS;  Service: Orthopedics;  Laterality: Right;   AV FISTULA PLACEMENT Left 04/16/2022   Procedure: INSERTION OF ARTERIOVENOUS (AV) GORE-TEX GRAFT ARM (BRACHIAL AXILLARY);  Surgeon: Renford Dills, MD;  Location: [facility] OR[facility];  Service: Vascular;  Laterality: Left;   CESAREAN SECTION     DIALYSIS/PERMA CATHETER INSERTION N/A 10/14/2021   Procedure: DIALYSIS/PERMA CATHETER INSERTION;  Surgeon: Annice Needy, MD;  Location: [facility];  Service: Cardiovascular;  Laterality: N/A;   DIALYSIS/PERMA CATHETER REMOVAL N/A 08/02/2022   Procedure: DIALYSIS/PERMA CATHETER REMOVAL;  Surgeon: Annice Needy, MD;  Location: [facility];  Service: Cardiovascular;  Laterality: N/A;   FRACTURE SURGERY     leg right fracture several surgeries, rods   INCISION AND DRAINAGE OF WOUND Right 06/15/2022   Procedure: IRRIGATION AND DEBRIDEMENT WOUND;  Surgeon: Signa Kell, MD;  Location: [facility] OR[facility];  Service: Orthopedics;  Laterality: Right;    Social History Social History   Tobacco Use   Smoking status: Former    Types: Cigarettes   Smokeless tobacco: Never  Substance Use Topics   Alcohol use: Never   Drug use: Never    Family History Family History  Problem Relation Age of Onset   Diabetes Mother    Colon cancer Sister    Diabetes Sister    Cancer Maternal Uncle    Clotting disorder Paternal Grandmother    Colon cancer Paternal Grandfather     Allergies  Allergen Reactions   Sulfamethoxazole-Trimethoprim     Other reaction(s): Kidney Disorder Other reaction(s): Kidney Disorder Hyperkalemia, AKI Hyperkalemia, AKI      REVIEW OF SYSTEMS  (Negative unless checked)  Constitutional: [] Weight loss  [] Fever  [] Chills Cardiac: [] Chest pain   [] Chest pressure   [] Palpitations   [] Shortness of breath when laying flat   [] Shortness of breath with exertion. Vascular:  [] Pain in legs with walking   [] Pain in legs at rest  [] History of DVT   [] Phlebitis   [] Swelling in legs   [] Varicose veins   [] Non-healing ulcers Pulmonary:   [] Uses home oxygen   [] Productive cough   [] Hemoptysis   [] Wheeze  [] COPD   [] Asthma Neurologic:  [] Dizziness   [] Seizures   [] History of stroke   [] History of TIA  [] Aphasia   [] Vissual changes   [] Weakness or numbness in arm   [] Weakness or numbness in leg Musculoskeletal:   [] Joint swelling   [] Joint pain   [] Low back pain Hematologic:  [] Easy bruising  [] Easy bleeding   [] Hypercoagulable state   [] Anemic Gastrointestinal:  [] Diarrhea   [] Vomiting  [] Gastroesophageal reflux/heartburn   [] Difficulty swallowing. Genitourinary:  [x] Chronic kidney disease   [] Difficult urination  [] Frequent urination   [] Blood in urine Skin:  [] Rashes   [] Ulcers  Psychological:  [] History of anxiety   []  History of major depression.  Physical Examination  Vitals:   12/16/22 1112  Weight: 279 lb (126.6 kg)   Body mass index is 47.89 kg/m. Gen: WD/WN, NAD Head: [place]/AT, No temporalis wasting.  Ear/Nose/Throat: Hearing grossly intact, nares w/o erythema or drainage Eyes: PER, EOMI, sclera nonicteric.  Neck: Supple, no gross masses or lesions.  No JVD.  Pulmonary:  Good air movement, no audible wheezing, no use of accessory muscles.  Cardiac: RRR, precordium non-hyperdynamic. Vascular:   Left brachial axillary AV graft very weak thrill weak bruit Vessel Right Left  Radial Palpable Palpable  Brachial Palpable Palpable  Gastrointestinal: soft, non-distended. No guarding/no peritoneal signs.  Musculoskeletal: M/S 5/5 throughout.  No deformity.  Neurologic: CN 2-12 intact. Pain and light touch intact in extremities.  Symmetrical.   Speech is fluent. Motor exam as listed above. Psychiatric: Judgment intact, Mood & affect appropriate for pt's clinical situation. Dermatologic: No rashes or ulcers noted.  No changes consistent with cellulitis.   CBC Lab Results  Component Value Date   WBC 6.6 07/26/2022   HGB 8.8 (L) 07/26/2022   HCT 30.5 (L) 07/26/2022  MCV 99.3 07/26/2022   PLT 208 07/26/2022    BMET    Component Value Date/Time   NA 135 07/26/2022 1506   K 5.3 (H) 07/26/2022 1506   CL 99 07/26/2022 1506   CO2 29 07/26/2022 1506   GLUCOSE 160 (H) 07/26/2022 1506   BUN 22 07/26/2022 1506   CREATININE 4.38 (H) 07/26/2022 1506   CALCIUM 8.7 (L) 07/26/2022 1506   GFRNONAA 10 (L) 07/26/2022 1506   CrCl cannot be calculated (Patient's most recent lab result is older than the maximum 21 days allowed.).  COAG Lab Results  Component Value Date   INR 1.2 07/22/2022   INR 1.8 (H) 03/02/2022    Radiology No results found.   Assessment/Plan 1. ESRD on hemodialysis [facility]) Recommend:  The patient is experiencing increasing problems with their dialysis access.  She has had a dramatic reduction in her volume flow compared to her last visit.  She is now only 350 mL/min and is at increased risk for thrombosis.  Patient should have a fistulagram with the intention for intervention.  The intention for intervention is to restore appropriate flow and prevent thrombosis and possible loss of the access.  As well as improve the quality of dialysis therapy.  The risks, benefits and alternative therapies were reviewed in detail with the patient.  All questions were answered.  The patient agrees to proceed with angio/intervention.    The patient will follow up with me in the office after the procedure.   2. Essential hypertension Continue antihypertensive medications as already ordered, these medications have been reviewed and there are no changes at this time.  3. Type 2 diabetes mellitus without complication,  unspecified whether long term insulin use (HCC) Continue hypoglycemic medications as already ordered, these medications have been reviewed and there are no changes at this time.  Hgb A1C to be monitored as already arranged by primary service  4. Hyperlipidemia with target LDL less than 70 Continue statin as ordered and reviewed, no changes at this time  5. Heterozygous factor V Leiden mutation (HCC) Continue anticoagulation as ordered given her hypercoagulable state.    Levora Dredge, MD  12/16/2022 11:16 AM

## 2022-12-17 ENCOUNTER — Encounter (INDEPENDENT_AMBULATORY_CARE_PROVIDER_SITE_OTHER): Payer: Self-pay | Admitting: Vascular Surgery

## 2022-12-21 ENCOUNTER — Telehealth (INDEPENDENT_AMBULATORY_CARE_PROVIDER_SITE_OTHER): Payer: Self-pay

## 2022-12-21 DIAGNOSIS — N186 End stage renal disease: Secondary | ICD-10-CM

## 2022-12-21 NOTE — Telephone Encounter (Signed)
I received a call from [facility] today stating the patient was given her Eliquis and they needed to reschedule her procedure. Patient was rescheduled to 12/28/22 with a 1:00 pm arrival time to the [facility]. Pre-procedure instructions will be faxed to [facility].

## 2022-12-21 NOTE — OR Nursing (Signed)
I called nursing home, the unit coordinator said she called [facility] this am, she is waiting for a call back from them to reschedule fistulagram. however the reason they cancelled was because they gave the eliquis.

## 2022-12-28 ENCOUNTER — Encounter
Admission: RE | Disposition: A | Discharge status: Home / Self Care | Payer: Self-pay | Source: Home / Self Care | Attending: Vascular Surgery

## 2022-12-28 ENCOUNTER — Encounter: Payer: Self-pay | Admitting: Vascular Surgery

## 2022-12-28 ENCOUNTER — Ambulatory Visit
Admission: RE | Admit: 2022-12-28 | Discharge: 2022-12-28 | Disposition: A | Discharge status: Home / Self Care | Payer: 59 | Source: Home / Self Care | Attending: Vascular Surgery | Admitting: Vascular Surgery

## 2022-12-28 ENCOUNTER — Other Ambulatory Visit: Payer: Self-pay

## 2022-12-28 DIAGNOSIS — Y841 Kidney dialysis as the cause of abnormal reaction of the patient, or of later complication, without mention of misadventure at the time of the procedure: Secondary | ICD-10-CM | POA: Insufficient documentation

## 2022-12-28 DIAGNOSIS — E1122 Type 2 diabetes mellitus with diabetic chronic kidney disease: Secondary | ICD-10-CM | POA: Diagnosis not present

## 2022-12-28 DIAGNOSIS — I5032 Chronic diastolic (congestive) heart failure: Secondary | ICD-10-CM | POA: Diagnosis not present

## 2022-12-28 DIAGNOSIS — Z992 Dependence on renal dialysis: Secondary | ICD-10-CM | POA: Insufficient documentation

## 2022-12-28 DIAGNOSIS — Z7901 Long term (current) use of anticoagulants: Secondary | ICD-10-CM | POA: Insufficient documentation

## 2022-12-28 DIAGNOSIS — E785 Hyperlipidemia, unspecified: Secondary | ICD-10-CM | POA: Diagnosis not present

## 2022-12-28 DIAGNOSIS — I132 Hypertensive heart and chronic kidney disease with heart failure and with stage 5 chronic kidney disease, or end stage renal disease: Secondary | ICD-10-CM | POA: Insufficient documentation

## 2022-12-28 DIAGNOSIS — D6851 Activated protein C resistance: Secondary | ICD-10-CM | POA: Insufficient documentation

## 2022-12-28 DIAGNOSIS — Z87891 Personal history of nicotine dependence: Secondary | ICD-10-CM | POA: Insufficient documentation

## 2022-12-28 DIAGNOSIS — T82858A Stenosis of vascular prosthetic devices, implants and grafts, initial encounter: Secondary | ICD-10-CM | POA: Diagnosis not present

## 2022-12-28 DIAGNOSIS — N186 End stage renal disease: Secondary | ICD-10-CM | POA: Diagnosis not present

## 2022-12-28 DIAGNOSIS — Z794 Long term (current) use of insulin: Secondary | ICD-10-CM | POA: Diagnosis not present

## 2022-12-28 HISTORY — PX: A/V FISTULAGRAM: CATH118298

## 2022-12-28 LAB — GLUCOSE, CAPILLARY
Glucose-Capillary: 98 mg/dL (ref 70–99)
Glucose-Capillary: 84 mg/dL (ref 70–99)

## 2022-12-28 LAB — POTASSIUM (ARMC VASCULAR LAB ONLY): Potassium (ARMC vascular lab): 5 mmol/L (ref 3.5–5.1)

## 2022-12-28 SURGERY — A/V FISTULAGRAM
Anesthesia: Moderate Sedation | Laterality: Left

## 2022-12-28 MED ORDER — HEPARIN SODIUM (PORCINE) 1000 UNIT/ML IJ SOLN
INTRAMUSCULAR | Status: AC
  Filled 2022-12-28: qty 10

## 2022-12-28 MED ORDER — MIDAZOLAM HCL 5 MG/5ML IJ SOLN
INTRAMUSCULAR | Status: AC
  Filled 2022-12-28: qty 5

## 2022-12-28 MED ORDER — FENTANYL CITRATE (PF) 100 MCG/2ML IJ SOLN
INTRAMUSCULAR | Status: AC
  Filled 2022-12-28: qty 2

## 2022-12-28 MED ORDER — HEPARIN (PORCINE) IN NACL 1000-0.9 UT/500ML-% IV SOLN
INTRAVENOUS | Status: DC | PRN
  Administered 2022-12-28: 1000 mL

## 2022-12-28 MED ORDER — METHYLPREDNISOLONE SODIUM SUCC 125 MG IJ SOLR: 125.0000 mg | Freq: Once | INTRAMUSCULAR | Status: DC | PRN

## 2022-12-28 MED ORDER — FAMOTIDINE 20 MG PO TABS: 40.0000 mg | ORAL_TABLET | Freq: Once | ORAL | Status: DC | PRN

## 2022-12-28 MED ORDER — HEPARIN SODIUM (PORCINE) 1000 UNIT/ML IJ SOLN
INTRAMUSCULAR | Status: DC | PRN
  Administered 2022-12-28: 4000 [IU] via INTRAVENOUS

## 2022-12-28 MED ORDER — IODIXANOL 320 MG/ML IV SOLN
INTRAVENOUS | Status: DC | PRN
  Administered 2022-12-28: 50 mL

## 2022-12-28 MED ORDER — HYDROMORPHONE HCL 1 MG/ML IJ SOLN: 1.0000 mg | Freq: Once | INTRAMUSCULAR | Status: DC | PRN

## 2022-12-28 MED ORDER — DIPHENHYDRAMINE HCL 50 MG/ML IJ SOLN: 50.0000 mg | Freq: Once | INTRAMUSCULAR | Status: DC | PRN

## 2022-12-28 MED ORDER — MIDAZOLAM HCL 2 MG/2ML IJ SOLN
INTRAMUSCULAR | Status: DC | PRN
  Administered 2022-12-28: 1 mg via INTRAVENOUS

## 2022-12-28 MED ORDER — CEFAZOLIN SODIUM-DEXTROSE 1-4 GM/50ML-% IV SOLN
1.0000 g | INTRAVENOUS | Status: AC
  Administered 2022-12-28: 1 g via INTRAVENOUS

## 2022-12-28 MED ORDER — ONDANSETRON HCL 4 MG/2ML IJ SOLN: 4.0000 mg | Freq: Four times a day (QID) | INTRAMUSCULAR | Status: DC | PRN

## 2022-12-28 MED ORDER — CEFAZOLIN SODIUM-DEXTROSE 1-4 GM/50ML-% IV SOLN
INTRAVENOUS | Status: AC
  Filled 2022-12-28: qty 50

## 2022-12-28 MED ORDER — MIDAZOLAM HCL 2 MG/ML PO SYRP: 8.0000 mg | ORAL_SOLUTION | Freq: Once | ORAL | Status: DC | PRN

## 2022-12-28 MED ORDER — FENTANYL CITRATE (PF) 100 MCG/2ML IJ SOLN
INTRAMUSCULAR | Status: DC | PRN
  Administered 2022-12-28: 50 ug via INTRAVENOUS

## 2022-12-28 MED ORDER — LIDOCAINE HCL (PF) 1 % IJ SOLN
INTRAMUSCULAR | Status: DC | PRN
  Administered 2022-12-28: 10 mL

## 2022-12-28 MED ORDER — SODIUM CHLORIDE 0.9 % IV SOLN: INTRAVENOUS | Status: DC

## 2022-12-28 SURGICAL SUPPLY — 19 items
BALLN DORADO 8X60X80 (BALLOONS) ×1
BALLN ULTRVRSE 4X40X75C (BALLOONS) ×1
BALLOON DORADO 8X60X80 (BALLOONS) IMPLANT
BALLOON ULTRVRSE 4X40X75C (BALLOONS) IMPLANT
CATH SLIP 5FR 0.38 X 40 KMP (CATHETERS) IMPLANT
DRAPE BRACHIAL (DRAPES) IMPLANT
GLIDEWIRE ADV .035X180CM (WIRE) IMPLANT
GOWN STRL REUS W/ TWL LRG LVL3 (GOWN DISPOSABLE) ×1 IMPLANT
GOWN STRL REUS W/TWL LRG LVL3 (GOWN DISPOSABLE) ×1
KIT ENCORE 26 ADVANTAGE (KITS) IMPLANT
NDL ENTRY 21GA 7CM ECHOTIP (NEEDLE) IMPLANT
NEEDLE ENTRY 21GA 7CM ECHOTIP (NEEDLE) ×1 IMPLANT
PACK ANGIOGRAPHY (CUSTOM PROCEDURE TRAY) ×1 IMPLANT
SET INTRO CAPELLA COAXIAL (SET/KITS/TRAYS/PACK) IMPLANT
SHEATH BRITE TIP 6FRX5.5 (SHEATH) IMPLANT
SHEATH BRITE TIP 7FRX5.5 (SHEATH) IMPLANT
STENT COVERA FLARED 8X60X80 (Permanent Stent) IMPLANT
SUT MNCRL AB 4-0 PS2 18 (SUTURE) IMPLANT
WIRE SUPRACORE 190CM (WIRE) IMPLANT

## 2022-12-28 NOTE — Interval H&P Note (Signed)
History and Physical Interval Note:  12/28/2022 2:48 PM  Jody Nelson  has presented today for surgery, with the diagnosis of L arm fistulagram   End Stage Renal.  The various methods of treatment have been discussed with the patient and family. After consideration of risks, benefits and other options for treatment, the patient has consented to  Procedure(s): A/V Fistulagram (Left) as a surgical intervention.  The patient's history has been reviewed, patient examined, no change in status, stable for surgery.  I have reviewed the patient's chart and labs.  Questions were answered to the patient's satisfaction.     Levora Dredge

## 2022-12-28 NOTE — Op Note (Signed)
OPERATIVE NOTE   PROCEDURE: Contrast injection left brachial axillary AV access Percutaneous transluminal angioplasty and stent placement venous outflow left brachial axillary AV access. Percutaneous transluminal angioplasty left brachial axillary AV access at the arterial anastomosis. Placement of sheaths left arm brachial axillary AV graft 7 French in the antegrade direction and a 6 French sheath in the retrograde direction with ultrasound guidance  PRE-OPERATIVE DIAGNOSIS: Complication of dialysis access                                                       End Stage Renal Disease  POST-OPERATIVE DIAGNOSIS: same as above   SURGEON: Renford Dills, M.D.  ANESTHESIA: Conscious sedation was administered under my direct supervision by the interventional radiology RN. IV Versed plus fentanyl were utilized. Continuous ECG, pulse oximetry and blood pressure was monitored throughout the entire procedure.  Conscious sedation was for a total of 40 minutes.  ESTIMATED BLOOD LOSS: minimal  FINDING(S): There is a string sign just proximal to the venous anastomosis there is also a greater than 80% stenosis at the level of the arterial anastomosis  SPECIMEN(S):  None  CONTRAST: 35 cc  FLUOROSCOPY TIME: 3.0 minutes  INDICATIONS: Jody Nelson is a 72 y.o. female who  presents with malfunctioning left arm AV access.  The patient is scheduled for angiography with possible intervention of the AV access.  The patient is aware the risks include but are not limited to: bleeding, infection, thrombosis of the cannulated access, and possible anaphylactic reaction to the contrast.  The patient acknowledges if the access can not be salvaged a tunneled catheter will be needed and will be placed during this procedure.  The patient is aware of the risks of the procedure and elects to proceed with the angiogram and intervention.  DESCRIPTION: After full informed written consent was obtained, the patient was  brought back to the Special Procedure suite and placed supine position.  Appropriate cardiopulmonary monitors were placed.  The left arm was prepped and draped in the standard fashion.  Appropriate timeout is called. The left arm was cannulated with a micropuncture needle.  Cannulation was performed with ultrasound guidance. Ultrasound was placed in a sterile sleeve, the AV access was interrogated and noted to be echolucent and compressible indicating patency. Image was recorded for the permanent record. The puncture is performed under continuous ultrasound visualization.   The microwire was advanced and the needle was exchanged for  a microsheath.  The J-wire was then advanced and a 6 Fr sheath inserted.  Hand injections were completed to image the access from the arterial anastomosis through the entire access.  The central venous structures were also imaged by hand injections.  Interpretation: The body of the graft appears to be in good condition without hemodynamically significant stenosis.  At the level of the venous anastomosis just proximal there is a string sign that extends over 20 mm in length.  The axillary subclavian and [place] and superior vena cava are widely patent.  At the level of the arterial anastomosis there is a greater than 80% stenosis.  Visualized portions of the brachial artery are widely patent.  Based on the images,  4000 units of heparin was given and a wire was negotiated through the strictures within the venous portion of the graft.  An 8 mm x 60 mm  Covera flared stent was deployed across the stenoses and postdilated with an 8 mm Dorado balloon inflated to 12 atm for approximately 1 minute.  Follow-up imaging demonstrates complete resolution of the stricture, less than 5% residual stenosis, with rapid flow of contrast through the graft, the central venous anatomy is preserved.  Ultrasound was delivered back into the field and an area more proximally was selected it was  infiltrated lidocaine ultrasound demonstrated the graft at this level was echolucent and compressible indicating patency.  A microneedle was then inserted into the graft under direct ultrasound visualization after an image had been recorded for the permanent record.  Microwire followed by microsheath J-wire followed by a 6 French sheath in the retrograde direction.  And advantage wire was then negotiated out into the distal brachial artery.  Imaging was performed by hand-injection and a 4 mm x 40 mm Ultraverse balloon was advanced across the anastomosis itself extending slightly (by 4 to 5 mm) into the brachial artery.  Inflation was to 12 atm for approximately 1 minute.  Follow-up imaging through a Kumpe catheter then demonstrated less than 15% residual stenosis with markedly improved flow.  A 4-0 Monocryl purse-string suture was sewn around both of the sheaths.  The sheath was removed and light pressure was applied.  A sterile bandage was applied to the puncture site.    COMPLICATIONS: None  CONDITION: Jody Nelson, M.D [facility] Office: [phone]  12/28/2022 3:38 PM

## 2023-01-20 ENCOUNTER — Other Ambulatory Visit (INDEPENDENT_AMBULATORY_CARE_PROVIDER_SITE_OTHER): Payer: Self-pay | Admitting: Vascular Surgery

## 2023-01-20 DIAGNOSIS — N186 End stage renal disease: Secondary | ICD-10-CM

## 2023-01-27 ENCOUNTER — Ambulatory Visit (INDEPENDENT_AMBULATORY_CARE_PROVIDER_SITE_OTHER): Payer: 59 | Admitting: Nurse Practitioner

## 2023-01-27 ENCOUNTER — Encounter (INDEPENDENT_AMBULATORY_CARE_PROVIDER_SITE_OTHER): Payer: 59

## 2023-03-12 ENCOUNTER — Emergency Department: Payer: 59

## 2023-03-12 ENCOUNTER — Inpatient Hospital Stay
Admission: EM | Admit: 2023-03-12 | Discharge: 2023-03-15 | DRG: 314 | Disposition: A | Discharge status: Skilled Nursing Facility | Payer: 59 | Source: Skilled Nursing Facility | Attending: Internal Medicine | Admitting: Internal Medicine

## 2023-03-12 ENCOUNTER — Other Ambulatory Visit: Payer: Self-pay

## 2023-03-12 DIAGNOSIS — Z800 Family history of malignant neoplasm of digestive organs: Secondary | ICD-10-CM

## 2023-03-12 DIAGNOSIS — E1122 Type 2 diabetes mellitus with diabetic chronic kidney disease: Secondary | ICD-10-CM | POA: Diagnosis present

## 2023-03-12 DIAGNOSIS — Z882 Allergy status to sulfonamides status: Secondary | ICD-10-CM

## 2023-03-12 DIAGNOSIS — I132 Hypertensive heart and chronic kidney disease with heart failure and with stage 5 chronic kidney disease, or end stage renal disease: Secondary | ICD-10-CM | POA: Diagnosis present

## 2023-03-12 DIAGNOSIS — I5032 Chronic diastolic (congestive) heart failure: Secondary | ICD-10-CM | POA: Diagnosis present

## 2023-03-12 DIAGNOSIS — E785 Hyperlipidemia, unspecified: Secondary | ICD-10-CM | POA: Diagnosis present

## 2023-03-12 DIAGNOSIS — Z87891 Personal history of nicotine dependence: Secondary | ICD-10-CM

## 2023-03-12 DIAGNOSIS — L02414 Cutaneous abscess of left upper limb: Secondary | ICD-10-CM | POA: Diagnosis present

## 2023-03-12 DIAGNOSIS — I69322 Dysarthria following cerebral infarction: Secondary | ICD-10-CM | POA: Diagnosis not present

## 2023-03-12 DIAGNOSIS — D696 Thrombocytopenia, unspecified: Secondary | ICD-10-CM | POA: Diagnosis present

## 2023-03-12 DIAGNOSIS — Z992 Dependence on renal dialysis: Secondary | ICD-10-CM | POA: Diagnosis not present

## 2023-03-12 DIAGNOSIS — G4700 Insomnia, unspecified: Secondary | ICD-10-CM | POA: Diagnosis present

## 2023-03-12 DIAGNOSIS — Y832 Surgical operation with anastomosis, bypass or graft as the cause of abnormal reaction of the patient, or of later complication, without mention of misadventure at the time of the procedure: Secondary | ICD-10-CM | POA: Diagnosis present

## 2023-03-12 DIAGNOSIS — T82838A Hemorrhage of vascular prosthetic devices, implants and grafts, initial encounter: Secondary | ICD-10-CM | POA: Diagnosis present

## 2023-03-12 DIAGNOSIS — Z79899 Other long term (current) drug therapy: Secondary | ICD-10-CM

## 2023-03-12 DIAGNOSIS — Z7901 Long term (current) use of anticoagulants: Secondary | ICD-10-CM

## 2023-03-12 DIAGNOSIS — N2581 Secondary hyperparathyroidism of renal origin: Secondary | ICD-10-CM | POA: Diagnosis present

## 2023-03-12 DIAGNOSIS — E1142 Type 2 diabetes mellitus with diabetic polyneuropathy: Secondary | ICD-10-CM | POA: Diagnosis present

## 2023-03-12 DIAGNOSIS — Z833 Family history of diabetes mellitus: Secondary | ICD-10-CM

## 2023-03-12 DIAGNOSIS — N186 End stage renal disease: Principal | ICD-10-CM | POA: Diagnosis present

## 2023-03-12 DIAGNOSIS — Z7985 Long-term (current) use of injectable non-insulin antidiabetic drugs: Secondary | ICD-10-CM

## 2023-03-12 DIAGNOSIS — I2510 Atherosclerotic heart disease of native coronary artery without angina pectoris: Secondary | ICD-10-CM | POA: Diagnosis present

## 2023-03-12 DIAGNOSIS — K219 Gastro-esophageal reflux disease without esophagitis: Secondary | ICD-10-CM | POA: Diagnosis present

## 2023-03-12 DIAGNOSIS — D6851 Activated protein C resistance: Secondary | ICD-10-CM | POA: Diagnosis present

## 2023-03-12 DIAGNOSIS — L02419 Cutaneous abscess of limb, unspecified: Secondary | ICD-10-CM

## 2023-03-12 DIAGNOSIS — Z832 Family history of diseases of the blood and blood-forming organs and certain disorders involving the immune mechanism: Secondary | ICD-10-CM

## 2023-03-12 DIAGNOSIS — E1151 Type 2 diabetes mellitus with diabetic peripheral angiopathy without gangrene: Secondary | ICD-10-CM | POA: Diagnosis present

## 2023-03-12 DIAGNOSIS — T82898A Other specified complication of vascular prosthetic devices, implants and grafts, initial encounter: Principal | ICD-10-CM | POA: Diagnosis present

## 2023-03-12 DIAGNOSIS — Z5986 Financial insecurity: Secondary | ICD-10-CM

## 2023-03-12 DIAGNOSIS — Z794 Long term (current) use of insulin: Secondary | ICD-10-CM | POA: Diagnosis not present

## 2023-03-12 DIAGNOSIS — G4733 Obstructive sleep apnea (adult) (pediatric): Secondary | ICD-10-CM | POA: Diagnosis present

## 2023-03-12 DIAGNOSIS — Z961 Presence of intraocular lens: Secondary | ICD-10-CM | POA: Diagnosis present

## 2023-03-12 DIAGNOSIS — D631 Anemia in chronic kidney disease: Secondary | ICD-10-CM | POA: Diagnosis present

## 2023-03-12 DIAGNOSIS — Z6841 Body Mass Index (BMI) 40.0 and over, adult: Secondary | ICD-10-CM | POA: Diagnosis not present

## 2023-03-12 DIAGNOSIS — Z7982 Long term (current) use of aspirin: Secondary | ICD-10-CM

## 2023-03-12 LAB — CBC WITH DIFFERENTIAL/PLATELET
Abs Immature Granulocytes: 0.02 10*3/uL (ref 0.00–0.07)
Basophils Absolute: 0 10*3/uL (ref 0.0–0.1)
Basophils Relative: 0 %
Eosinophils Absolute: 0.2 10*3/uL (ref 0.0–0.5)
Eosinophils Relative: 3 %
HCT: 30.1 % — ABNORMAL LOW (ref 36.0–46.0)
Hemoglobin: 9.3 g/dL — ABNORMAL LOW (ref 12.0–15.0)
Immature Granulocytes: 0 %
Lymphocytes Relative: 24 %
Lymphs Abs: 1.5 10*3/uL (ref 0.7–4.0)
MCH: 30.9 pg (ref 26.0–34.0)
MCHC: 30.9 g/dL (ref 30.0–36.0)
MCV: 100 fL (ref 80.0–100.0)
Monocytes Absolute: 1.1 10*3/uL — ABNORMAL HIGH (ref 0.1–1.0)
Monocytes Relative: 18 %
Neutro Abs: 3.5 10*3/uL (ref 1.7–7.7)
Neutrophils Relative %: 55 %
Platelets: 116 10*3/uL — ABNORMAL LOW (ref 150–400)
RBC: 3.01 MIL/uL — ABNORMAL LOW (ref 3.87–5.11)
RDW: 13.2 % (ref 11.5–15.5)
WBC: 6.3 10*3/uL (ref 4.0–10.5)
nRBC: 0 % (ref 0.0–0.2)

## 2023-03-12 LAB — COMPREHENSIVE METABOLIC PANEL
ALT: 11 U/L (ref 0–44)
AST: 16 U/L (ref 15–41)
Albumin: 3.4 g/dL — ABNORMAL LOW (ref 3.5–5.0)
Alkaline Phosphatase: 53 U/L (ref 38–126)
Anion gap: 9 (ref 5–15)
BUN: 25 mg/dL — ABNORMAL HIGH (ref 8–23)
CO2: 28 mmol/L (ref 22–32)
Calcium: 8.6 mg/dL — ABNORMAL LOW (ref 8.9–10.3)
Chloride: 99 mmol/L (ref 98–111)
Creatinine, Ser: 6.43 mg/dL — ABNORMAL HIGH (ref 0.44–1.00)
GFR, Estimated: 6 mL/min — ABNORMAL LOW (ref >60)
Glucose, Bld: 166 mg/dL — ABNORMAL HIGH (ref 70–99)
Potassium: 4.6 mmol/L (ref 3.5–5.1)
Sodium: 136 mmol/L (ref 135–145)
Total Bilirubin: 1.1 mg/dL (ref 0.3–1.2)
Total Protein: 7.1 g/dL (ref 6.5–8.1)

## 2023-03-12 MED ORDER — PIPERACILLIN-TAZOBACTAM 3.375 G IVPB 30 MIN
3.3750 g | Freq: Once | INTRAVENOUS | Status: AC
  Administered 2023-03-12: 3.375 g via INTRAVENOUS
  Filled 2023-03-12: qty 50

## 2023-03-12 MED ORDER — VANCOMYCIN HCL 2000 MG/400ML IV SOLN: 2000.0000 mg | Freq: Once | INTRAVENOUS | Status: DC

## 2023-03-12 MED ORDER — VANCOMYCIN HCL IN DEXTROSE 1-5 GM/200ML-% IV SOLN
1000.0000 mg | Freq: Once | INTRAVENOUS | Status: AC
  Administered 2023-03-13: 1000 mg via INTRAVENOUS
  Filled 2023-03-12: qty 200

## 2023-03-12 NOTE — Consult Note (Signed)
[facility] OF [place]  ASSESSMENT / PLAN: 72 y.o. female with left upper extremity infiltrated fistula with hematoma. Some concern for infection given warmth on exam, but no purulent drainage. Will need tunneled dialysis catheter placement +/- I&D in OR. Will plan to do this Monday as OR schedule allows.   CHIEF COMPLAINT: left arm swollen and tender  HISTORY OF PRESENT ILLNESS: Jody Nelson is a 72 y.o. female referred to [facility] emergency department for evaluation of left upper extremity bruising and swelling.  Patient has end-stage renal disease and dialyzes through a left arm brachiocephalic arteriovenous fistula on Monday, Wednesday, and Friday.  Her last dialysis session was Friday, 03/11/2023.  The patient reports the swelling has been there for many days.  Patient reports no constitutional signs of infection.  The arm is painful.  She has noticed no drainage.  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   At high risk for falls    Atypical chest pain    Bilateral lower extremity edema    Chronic right sided weakness    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   a.) MRI brain 02/27/2011: old lacunar infarct in RIGHT pons and medulla oblongata   Cryptogenic stroke (HCC) 10/11/2012   a.) CT brain 10/11/2012: tiny old lacunar infarct in head of caudate nucleus (left)   Cryptogenic stroke (HCC) 10/12/2012   a.) MRI brain 10/12/2012: multiple acute LEFT hemispheric infarcts   Cryptogenic stroke (HCC) 06/26/2014   a.) MRI brain 06/26/2014:  acute RIGHT posterior frontal lobe infarct involving both and grey matter   Cryptogenic stroke (HCC) 07/29/2015   a.) MRI brain 07/29/2015: 2 foci of diffusion restriction in the LEFT parietal lobe consistent with acute infarction   Cryptogenic stroke (HCC) 07/24/2017   a.) MRI brain 07/24/2017: punctate focus of acute ischemia in the medial LEFT temporal lobe   DDD (degenerative disc disease), cervical    Depression    DOE (dyspnea on exertion)    Dysarthria as late effect of stroke    ESRD (end stage renal disease) on dialysis (HCC)    a.) M-W-F   GERD (gastroesophageal reflux disease)    Hallux valgus, bilateral    Heterozygous factor V Leiden mutation (HCC) 07/18/2018   History of cardiac catheterization 08/30/2008   a.) LHC 08/30/2008: EF >60%, LVEDP 18 mmHg, normal coronaries.   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   a.) 07/18/2018 --> 22 mol/L   Hypertension    Insomnia    a.) melatonin + trazodone PRN   Long term current use of anticoagulant    a.) apixaban   Lymphedema of both lower extremities    Meralgia paresthetica, left lower limb    OSA (obstructive sleep apnea)    a.) does not require nocturnal PAP therapy   Osteoarthritis    Pneumonia    Pseudophakia  of both eyes    Sarcoidosis    Secondary hyperparathyroidism of renal origin (HCC)    Type 2 diabetes mellitus treated with insulin (HCC)    Vitamin D deficiency     Past Surgical History:  Procedure Laterality Date   A/V FISTULAGRAM Left 12/28/2022   Procedure: A/V Fistulagram;  Surgeon: Renford Dills, MD;  Location: [facility] INVASIVE CV LAB;  Service: Cardiovascular;  Laterality: Left;   APPLICATION OF WOUND VAC Right 06/15/2022   Procedure:  APPLICATION OF WOUND VAC;  Surgeon: Signa Kell, MD;  Location: [facility] ORS;  Service: Orthopedics;  Laterality: Right;   AV FISTULA PLACEMENT Left 04/16/2022   Procedure: INSERTION OF ARTERIOVENOUS (AV) GORE-TEX GRAFT ARM (BRACHIAL AXILLARY);  Surgeon: Renford Dills, MD;  Location: [facility] ORS;  Service: Vascular;  Laterality: Left;   CESAREAN SECTION     DIALYSIS/PERMA CATHETER INSERTION N/A 10/14/2021   Procedure: DIALYSIS/PERMA CATHETER INSERTION;  Surgeon: Annice Needy, MD;  Location: [facility] INVASIVE CV LAB;  Service: Cardiovascular;  Laterality: N/A;   DIALYSIS/PERMA CATHETER REMOVAL N/A 08/02/2022   Procedure: DIALYSIS/PERMA CATHETER REMOVAL;  Surgeon: Annice Needy, MD;  Location: [facility] INV[facility] CV [facility];  Service: Cardiovascular;  Laterality: N/A;   FRACTURE SURGERY     leg right fracture several surgeries, rods   INCISION AND DRAINAGE OF WOUND Right 06/15/2022   Procedure: IRRIGATION AND DEBRIDEMENT WOUND;  Surgeon: Signa Kell, MD;  Location: [facility] ORS;  Service: Orthopedics;  Laterality: Right;    Family History  Problem Relation Age of Onset   Diabetes Mother    Colon cancer Sister    Diabetes Sister    Cancer Maternal Uncle    Clotting disorder Paternal Grandmother    Colon cancer Paternal Grandfather     Social History   Socioeconomic History   Marital status: Widowed    Spouse name: Not on file   Number of children: Not on file   Years of education: Not on file   Highest education level: Not on file  Occupational History   Not on file  Tobacco Use   Smoking status: Former    Types: Cigarettes   Smokeless tobacco: Never  Substance and Sexual Activity   Alcohol use: Never   Drug use: Never   Sexual activity: Not on file  Other Topics Concern   Not on file  Social History Narrative   Not on file   Social Determinants of Health   Financial Resource Strain: Medium Risk (02/02/2021)   Received from [facility], [facility]    Overall Financial Resource Strain (CARDIA)    Difficulty of Paying Living Expenses: Somewhat hard  Food Insecurity: No Food Insecurity (07/23/2022)   Hunger Vital Sign    Worried About Running Out of Food in the Last Year: Never true    Ran Out of Food in the Last Year: Never true  Transportation Needs: No Transportation Needs (07/23/2022)   PRAPARE - Administrator, Civil Service (Medical): No    Lack of Transportation (Non-Medical): No  Physical Activity: Unknown (07/17/2017)   Received from [facility], [facility]   Exercise Vital Sign    Days of Exercise per Week: Patient declined    Minutes of Exercise per Session: Patient declined  Stress: No Stress Concern Present (11/01/2018)   Received from [facility], [facility]   [employer] of Occupational Health - Occupational Stress Questionnaire    Feeling of  Stress : Not at all  Social Connections: Moderately Integrated (11/01/2018)   Received from [facility], [facility]   Social Connection and Isolation Panel [NHANES]    Frequency of Communication with Friends and Family: More than three times a week    Frequency of Social Gatherings with Friends and Family: More than three times a week    Attends Religious Services: More than 4 times per year    Active Member of [employer] or Organizations: Yes    Attends Banker Meetings: More than 4 times per year    Marital Status: Widowed  Intimate Partner Violence: Not At Risk (07/23/2022)   Humiliation, Afraid, Rape, and Kick questionnaire    Fear of Current or Ex-Partner: No    Emotionally Abused: No    Physically Abused: No    Sexually Abused: No    Allergies  Allergen Reactions   Sulfamethoxazole-Trimethoprim     Other reaction(s): Kidney Disorder Other reaction(s): Kidney Disorder Hyperkalemia, AKI Hyperkalemia, AKI     Current  Facility-Administered Medications  Medication Dose Route Frequency Provider Last Rate Last Admin   piperacillin-tazobactam (ZOSYN) IVPB 3.375 g  3.375 g Intravenous Once Sharman Cheek, MD 100 mL/hr at 03/12/23 2328 3.375 g at 03/12/23 2328   vancomycin (VANCOCIN) IVPB 1000 mg/200 mL premix  1,000 mg Intravenous Once Otelia Sergeant, RPH       And   [START ON 03/13/2023] vancomycin (VANCOCIN) IVPB 1000 mg/200 mL premix  1,000 mg Intravenous Once Otelia Sergeant, [facility]       Current Outpatient Medications  Medication Sig Dispense Refill   acetaminophen (TYLENOL) 325 MG tablet Take 2 tablets by mouth every 8 (eight) hours as needed.     ACIDOPHILUS LACTOBACILLUS PO Take 1 capsule by mouth 3 (three) times daily.     albuterol (PROVENTIL) (2.5 MG/3ML) 0.083% nebulizer solution Take 2.5 mg by nebulization every 6 (six) hours as needed for wheezing or shortness of breath.     albuterol (VENTOLIN HFA) 108 (90 Base) MCG/ACT inhaler Inhale 1-2 puffs into the lungs every 4 (four) hours as needed for wheezing or shortness of breath.     apixaban (ELIQUIS) 5 MG TABS tablet Take 1 tablet (5 mg total) by mouth 2 (two) times daily. 60 tablet    aspirin 81 MG chewable tablet Chew 81 mg by mouth daily.     atorvastatin (LIPITOR) 80 MG tablet Take 80 mg by mouth daily.     calcitRIOL (ROCALTROL) 0.25 MCG capsule Take 0.25 mcg by mouth daily.     calcium acetate (PHOSLO) 667 MG capsule Take one capsule by mouth twice daily with food on Mondays, Wednesdays, and Fridays. Take one capsule 3 times daily on Tuesdays, Thursdays, Saturdays, and Sundays.     Calcium Carb-Cholecalciferol (OYSTER SHELL CALCIUM W/D) 500-5 MG-MCG TABS Take 1 tablet by mouth daily as needed.     carvedilol (COREG) 25 MG tablet Take 1 tablet by mouth 2 (two) times daily with a meal.     docusate sodium (COLACE) 100 MG capsule Take 100 mg by mouth 2 (two) times daily.     ferrous sulfate 324 (65 Fe) MG TBEC Take 1 tablet by mouth daily.      fluticasone (FLONASE) 50 MCG/ACT nasal spray Place 1 spray into both nostrils daily.     Fluticasone Furoate 50 MCG/ACT AEPB Inhale 1 spray into the lungs daily.     folic acid (FOLVITE) 1 MG tablet Take 1 mg by  mouth daily.     gabapentin (NEURONTIN) 100 MG capsule Take 3 capsules by mouth daily. Mon, wed, and Friday     gabapentin (NEURONTIN) 300 MG capsule Take 300 mg by mouth 2 (two) times daily. On sun, tue, thur, and sat     HYDROcodone-acetaminophen (NORCO/VICODIN) 5-325 MG tablet Take 1 tablet by mouth every 6 (six) hours as needed for moderate pain.     insulin glargine (LANTUS) 100 UNIT/ML Solostar Pen Inject 10 Units into the skin at bedtime.     insulin lispro (HUMALOG) 100 UNIT/ML injection Inject 2-12 Units into the skin in the morning, at noon, and at bedtime. Per sliding scale     latanoprost (XALATAN) 0.005 % ophthalmic solution Place 1 drop into both eyes at bedtime.     lidocaine-prilocaine (EMLA) cream Apply 1 application  topically 3 (three) times a week. On mon, wed, and friday     magnesium hydroxide (MILK OF MAGNESIA) 400 MG/5ML suspension Take 30 mLs by mouth daily as needed for mild constipation.     metroNIDAZOLE (METROGEL) 1 % gel Apply topically.     montelukast (SINGULAIR) 10 MG tablet Take 10 mg by mouth daily.     Multiple Vitamins-Minerals (PRESERVISION AREDS) TABS Take 250 mg by mouth 2 (two) times daily.     omeprazole (PRILOSEC) 10 MG capsule Take 10 mg by mouth daily.     polyethylene glycol (MIRALAX / GLYCOLAX) 17 g packet Take 17 g by mouth daily.     pregabalin (LYRICA) 25 MG capsule Take 25 mg by mouth daily.     Semaglutide (OZEMPIC, 0.25 OR 0.5 MG/DOSE, [place]) Inject 0.5 mg into the skin once a week. On fridays     torsemide (DEMADEX) 10 MG tablet Take 10 mg by mouth daily.      PHYSICAL EXAM Vitals:   03/12/23 1746 03/12/23 1753  BP:  (!) 135/53  Pulse:  92  Resp:  16  Temp:  98.1 F (36.7 C)  TempSrc:  Oral  SpO2:  100%  Height: 5\' 6"  (1.676 m)     Elderly woman in no distress Regular rate and rhythm Unlabored breathing Left upper extremity arteriovenous fistula with thrill Immediately below the fistula is a large area of hematoma.  This area does feel warm.  There is no other evidence of infection.  PERTINENT LABORATORY AND RADIOLOGIC DATA  Most recent CBC    Latest Ref Rng & Units 07/26/2022    3:06 PM 07/24/2022    3:16 AM 07/22/2022    8:30 PM  CBC  WBC 4.0 - 10.5 K/uL 6.6  5.1  7.7   Hemoglobin 12.0 - 15.0 g/dL 8.8  8.8  9.2   Hematocrit 36.0 - 46.0 % 30.5  29.3  31.5   Platelets 150 - 400 K/uL 208  169  186      Most recent CMP    Latest Ref Rng & Units 07/26/2022    3:06 PM 07/24/2022    3:16 AM 07/22/2022    8:30 PM  CMP  Glucose 70 - 99 mg/dL 696  295  284   BUN 8 - 23 mg/dL 22  15  18    Creatinine 0.44 - 1.00 mg/dL 1.32  4.40  1.02   Sodium 135 - 145 mmol/L 135  137  138   Potassium 3.5 - 5.1 mmol/L 5.3  4.1  4.1   Chloride 98 - 111 mmol/L 99  101  101   CO2 22 - 32 mmol/L 29  30  28   Calcium 8.9 - 10.3 mg/dL 8.7  8.4  8.6   Total Protein 6.5 - 8.1 g/dL   7.0   Total Bilirubin 0.3 - 1.2 mg/dL   0.6   Alkaline Phos 38 - 126 U/L   69   AST 15 - 41 U/L   25   ALT 0 - 44 U/L   16     Renal function CrCl cannot be calculated (Patient's most recent lab result is older than the maximum 21 days allowed.).  Hgb A1c MFr Bld (%)  Date Value  06/13/2022 8.7 (H)    LDL Cholesterol  Date Value Ref Range Status  07/23/2022 33 0 - 99 mg/dL Final    Comment:           Total Cholesterol/HDL:CHD Risk Coronary Heart Disease Risk Table                     Men   Women  1/2 Average Risk   3.4   3.3  Average Risk       5.0   4.4  2 X Average Risk   9.6   7.1  3 X Average Risk  23.4   11.0        Use the calculated Patient Ratio above and the CHD Risk Table to determine the patient's CHD Risk.        ATP III CLASSIFICATION (LDL):  <100     mg/dL   Optimal  846-962  mg/dL   Near or Above                     Optimal  130-159  mg/dL   Borderline  952-841  mg/dL   High  >324     mg/dL   Very High Performed at [facility], [address] [place], [place] [address]     Duplex ultrasound left upper extremity 1. Left upper extremity brachiocephalic dialysis graft is identified though is not optimally assessed on this examination. Scattered images involving the proximal, mid, and distal portions of the fistula demonstrates patency with appropriate low resistance arterial waveforms, though these are not rigidly assessed on this exam. 2. Small amount of a poorly loculated perigraft fluid possibly representing perigraft edema or hemorrhage. 3. 3.6 x 2.5 x 2.6 cm loculated complex fluid collection demonstrating internal debris deep and medial to the dialysis fistula possibly representing a subcutaneous hematoma. Super infection is not excluded on this examination.  Rande Brunt. Lenell Antu, MD FACS [facility] Phone Number: [phone] 03/12/2023 11:30 PM   Total time spent on preparing this encounter including chart review, data review, collecting history, examining the patient, coordinating care for this new patient, 60 minutes.  Portions of this report may have been transcribed using voice recognition software.  Every effort has been made to ensure accuracy; however, inadvertent computerized transcription errors may still be present.

## 2023-03-12 NOTE — ED Notes (Signed)
Amy from [facility]) called to check on pt.

## 2023-03-12 NOTE — ED Triage Notes (Signed)
Pt states fistula is painful, bruised, and swollen. Dialysis treatment schedule MWF. Last went to dialysis on 03/11/23.

## 2023-03-12 NOTE — ED Triage Notes (Signed)
Pt comes via EMS from [facility] with c/o fistula sore. Pt has this in left arm.  Facility said it was swollen and discolored.

## 2023-03-12 NOTE — ED Notes (Signed)
PT stated she was hungry and has not ate, provided dinner tray and water.

## 2023-03-12 NOTE — ED Provider Notes (Signed)
[facility] Provider Note    Event Date/Time   First MD Initiated Contact with Patient 03/12/23 2109     (approximate)   History   Chief Complaint: No chief complaint on file.   HPI  Jody Nelson is a 72 y.o. female with a history of heart failure, ESRD on hemodialysis, diabetes, obesity who comes to the ED due to pain and swelling of the left upper arm for the past few days.  Last dialysis session was yesterday, with successful cannulation of her left upper extremity AV fistula and completion of dialysis.  Denies any chest pain shortness of breath or fevers.  No other complaints.     Physical Exam   Triage Vital Signs: ED Triage Vitals  Encounter Vitals Group     BP 03/12/23 1753 (!) 135/53     Systolic BP Percentile --      Diastolic BP Percentile --      Pulse Rate 03/12/23 1753 92     Resp 03/12/23 1753 16     Temp 03/12/23 1753 98.1 F (36.7 C)     Temp Source 03/12/23 1753 Oral     SpO2 03/12/23 1753 100 %     Weight --      Height 03/12/23 1746 5\' 6"  (1.676 m)     Head Circumference --      Peak Flow --      Pain Score 03/12/23 1744 6     Pain Loc --      Pain Education --      Exclude from Growth Chart --     Most recent vital signs: Vitals:   03/12/23 1753  BP: (!) 135/53  Pulse: 92  Resp: 16  Temp: 98.1 F (36.7 C)  SpO2: 100%    General: Awake, no distress.  CV:  Good peripheral perfusion.  Regular rate and rhythm Resp:  Normal effort.  Abd:  No distention.  Other:  Left UE AV fistula with normal thrill.  Medial to the fistula on the left upper arm, there is soft tissue firmness with central fluctuance, induration, tenderness, concerning for abscess.  There is wider discoloration of the medial aspect of the upper arm   ED Results / Procedures / Treatments   Labs (all labs ordered are listed, but only abnormal results are displayed) Labs Reviewed  CBC WITH DIFFERENTIAL/PLATELET - Abnormal; Notable for the following  components:      Result Value   RBC 3.01 (*)    Hemoglobin 9.3 (*)    HCT 30.1 (*)    Platelets 116 (*)    Monocytes Absolute 1.1 (*)    All other components within normal limits  CULTURE, BLOOD (ROUTINE X 2)  CULTURE, BLOOD (ROUTINE X 2)  COMPREHENSIVE METABOLIC PANEL     EKG    RADIOLOGY Ultrasound left upper extremity shows soft tissue abscess, adjacent to AV fistula.  Normal flow in the fistula.  Radiology report reviewed   PROCEDURES:  Procedures   MEDICATIONS ORDERED IN ED: Medications  piperacillin-tazobactam (ZOSYN) IVPB 3.375 g (3.375 g Intravenous New Bag/Given 03/12/23 2328)  vancomycin (VANCOCIN) IVPB 1000 mg/200 mL premix (has no administration in time range)    And  vancomycin (VANCOCIN) IVPB 1000 mg/200 mL premix (has no administration in time range)     IMPRESSION / MDM / ASSESSMENT AND PLAN / ED COURSE  I reviewed the triage vital signs and the nursing notes.  DDx: cellulitis, abscess, AVF thrombosis, hematoma  Patient's presentation  is most consistent with acute presentation with potential threat to life or bodily function.  Patient presents with pain and swelling of the left upper arm adjacent to AV fistula.  Ultrasound confirms an abscess in this area with soft tissue infection abutting the fistula.  Discussed with vascular surgery who recommends admission for likely I&D in the OR tomorrow       FINAL CLINICAL IMPRESSION(S) / ED DIAGNOSES   Final diagnoses:  ESRD on hemodialysis (HCC)  Abscess of upper arm     Rx / DC Orders   ED Discharge Orders     None        Note:  This document was prepared using Dragon voice recognition software and may include unintentional dictation errors.   Sharman Cheek, MD 03/12/23 2337

## 2023-03-13 DIAGNOSIS — N186 End stage renal disease: Secondary | ICD-10-CM

## 2023-03-13 DIAGNOSIS — L02414 Cutaneous abscess of left upper limb: Secondary | ICD-10-CM

## 2023-03-13 DIAGNOSIS — E1142 Type 2 diabetes mellitus with diabetic polyneuropathy: Secondary | ICD-10-CM | POA: Diagnosis not present

## 2023-03-13 DIAGNOSIS — D6851 Activated protein C resistance: Secondary | ICD-10-CM

## 2023-03-13 DIAGNOSIS — E785 Hyperlipidemia, unspecified: Secondary | ICD-10-CM

## 2023-03-13 DIAGNOSIS — Z992 Dependence on renal dialysis: Secondary | ICD-10-CM

## 2023-03-13 LAB — BASIC METABOLIC PANEL
Anion gap: 6 (ref 5–15)
BUN: 30 mg/dL — ABNORMAL HIGH (ref 8–23)
CO2: 30 mmol/L (ref 22–32)
Calcium: 8.2 mg/dL — ABNORMAL LOW (ref 8.9–10.3)
Chloride: 99 mmol/L (ref 98–111)
Creatinine, Ser: 6.93 mg/dL — ABNORMAL HIGH (ref 0.44–1.00)
GFR, Estimated: 6 mL/min — ABNORMAL LOW (ref >60)
Glucose, Bld: 186 mg/dL — ABNORMAL HIGH (ref 70–99)
Potassium: 4 mmol/L (ref 3.5–5.1)
Sodium: 135 mmol/L (ref 135–145)

## 2023-03-13 LAB — CBC
HCT: 26.8 % — ABNORMAL LOW (ref 36.0–46.0)
Hemoglobin: 8.4 g/dL — ABNORMAL LOW (ref 12.0–15.0)
MCH: 31.2 pg (ref 26.0–34.0)
MCHC: 31.3 g/dL (ref 30.0–36.0)
MCV: 99.6 fL (ref 80.0–100.0)
Platelets: 105 10*3/uL — ABNORMAL LOW (ref 150–400)
RBC: 2.69 MIL/uL — ABNORMAL LOW (ref 3.87–5.11)
RDW: 13.2 % (ref 11.5–15.5)
WBC: 4.8 10*3/uL (ref 4.0–10.5)
nRBC: 0 % (ref 0.0–0.2)

## 2023-03-13 LAB — CBG MONITORING, ED
Glucose-Capillary: 164 mg/dL — ABNORMAL HIGH (ref 70–99)
Glucose-Capillary: 103 mg/dL — ABNORMAL HIGH (ref 70–99)
Glucose-Capillary: 63 mg/dL — ABNORMAL LOW (ref 70–99)
Glucose-Capillary: 76 mg/dL (ref 70–99)
Glucose-Capillary: 113 mg/dL — ABNORMAL HIGH (ref 70–99)

## 2023-03-13 LAB — HEMOGLOBIN A1C
Hgb A1c MFr Bld: 7.3 % — ABNORMAL HIGH (ref 4.8–5.6)
Mean Plasma Glucose: 162.81 mg/dL

## 2023-03-13 LAB — PROTIME-INR
INR: 1.6 — ABNORMAL HIGH (ref 0.8–1.2)
Prothrombin Time: 19.4 s — ABNORMAL HIGH (ref 11.4–15.2)

## 2023-03-13 LAB — APTT: aPTT: 44 s — ABNORMAL HIGH (ref 24–36)

## 2023-03-13 MED ORDER — GABAPENTIN 300 MG PO CAPS: 300.0000 mg | ORAL_CAPSULE | Freq: Every day | ORAL | Status: DC

## 2023-03-13 MED ORDER — FLUTICASONE PROPIONATE 50 MCG/ACT NA SUSP
1.0000 | Freq: Every day | NASAL | Status: DC
  Administered 2023-03-14: 1 via NASAL
  Filled 2023-03-13 (×2): qty 16

## 2023-03-13 MED ORDER — DOCUSATE SODIUM 100 MG PO CAPS
100.0000 mg | ORAL_CAPSULE | Freq: Two times a day (BID) | ORAL | Status: DC
  Administered 2023-03-13 – 2023-03-15 (×5): 100 mg via ORAL
  Filled 2023-03-13 (×5): qty 1

## 2023-03-13 MED ORDER — FERROUS SULFATE 325 (65 FE) MG PO TABS
325.0000 mg | ORAL_TABLET | Freq: Every day | ORAL | Status: DC
  Administered 2023-03-13 – 2023-03-15 (×3): 325 mg via ORAL
  Filled 2023-03-13 (×3): qty 1

## 2023-03-13 MED ORDER — ALBUTEROL SULFATE (2.5 MG/3ML) 0.083% IN NEBU: 2.5000 mg | INHALATION_SOLUTION | Freq: Four times a day (QID) | RESPIRATORY_TRACT | Status: DC | PRN

## 2023-03-13 MED ORDER — ONDANSETRON HCL 4 MG PO TABS: 4.0000 mg | ORAL_TABLET | Freq: Four times a day (QID) | ORAL | Status: DC | PRN

## 2023-03-13 MED ORDER — GABAPENTIN 300 MG PO CAPS: 300.0000 mg | ORAL_CAPSULE | Freq: Two times a day (BID) | ORAL | Status: DC

## 2023-03-13 MED ORDER — ACETAMINOPHEN 650 MG RE SUPP: 650.0000 mg | Freq: Four times a day (QID) | RECTAL | Status: DC | PRN

## 2023-03-13 MED ORDER — SODIUM CHLORIDE 0.9 % IV SOLN
1.0000 g | Freq: Every day | INTRAVENOUS | Status: DC
  Administered 2023-03-13 – 2023-03-14 (×2): 1 g via INTRAVENOUS
  Filled 2023-03-13 (×2): qty 10

## 2023-03-13 MED ORDER — ACIDOPHILUS LACTOBACILLUS PO CAPS: ORAL_CAPSULE | Freq: Three times a day (TID) | ORAL | Status: DC

## 2023-03-13 MED ORDER — CALCIUM ACETATE (PHOS BINDER) 667 MG PO CAPS
667.0000 mg | ORAL_CAPSULE | ORAL | Status: DC
  Administered 2023-03-13 – 2023-03-15 (×4): 667 mg via ORAL
  Filled 2023-03-13 (×6): qty 1

## 2023-03-13 MED ORDER — MORPHINE SULFATE (PF) 2 MG/ML IV SOLN: 2.0000 mg | INTRAVENOUS | Status: DC | PRN

## 2023-03-13 MED ORDER — CALCIUM ACETATE (PHOS BINDER) 667 MG PO CAPS
667.0000 mg | ORAL_CAPSULE | ORAL | Status: DC
  Administered 2023-03-14: 667 mg via ORAL
  Filled 2023-03-13 (×2): qty 1

## 2023-03-13 MED ORDER — ONDANSETRON HCL 4 MG/2ML IJ SOLN: 4.0000 mg | Freq: Four times a day (QID) | INTRAMUSCULAR | Status: DC | PRN

## 2023-03-13 MED ORDER — LATANOPROST 0.005 % OP SOLN
1.0000 [drp] | Freq: Every day | OPHTHALMIC | Status: DC
  Administered 2023-03-13 – 2023-03-14 (×2): 1 [drp] via OPHTHALMIC
  Filled 2023-03-13 (×3): qty 2.5

## 2023-03-13 MED ORDER — INSULIN GLARGINE-YFGN 100 UNIT/ML ~~LOC~~ SOLN
10.0000 [IU] | Freq: Every day | SUBCUTANEOUS | Status: DC
  Administered 2023-03-13 – 2023-03-14 (×3): 10 [IU] via SUBCUTANEOUS
  Filled 2023-03-13 (×4): qty 0.1

## 2023-03-13 MED ORDER — HEPARIN SODIUM (PORCINE) 5000 UNIT/ML IJ SOLN
5000.0000 [IU] | Freq: Three times a day (TID) | INTRAMUSCULAR | Status: DC
  Administered 2023-03-13 – 2023-03-15 (×4): 5000 [IU] via SUBCUTANEOUS
  Filled 2023-03-13 (×5): qty 1

## 2023-03-13 MED ORDER — TORSEMIDE 20 MG PO TABS
10.0000 mg | ORAL_TABLET | Freq: Every day | ORAL | Status: DC
  Administered 2023-03-13 – 2023-03-14 (×2): 10 mg via ORAL
  Filled 2023-03-13 (×2): qty 1

## 2023-03-13 MED ORDER — PANTOPRAZOLE SODIUM 40 MG PO TBEC
40.0000 mg | DELAYED_RELEASE_TABLET | Freq: Every day | ORAL | Status: DC
  Administered 2023-03-13 – 2023-03-15 (×3): 40 mg via ORAL
  Filled 2023-03-13 (×3): qty 1

## 2023-03-13 MED ORDER — MONTELUKAST SODIUM 10 MG PO TABS
10.0000 mg | ORAL_TABLET | Freq: Every day | ORAL | Status: DC
  Administered 2023-03-13 – 2023-03-14 (×2): 10 mg via ORAL
  Filled 2023-03-13 (×2): qty 1

## 2023-03-13 MED ORDER — VANCOMYCIN HCL IN DEXTROSE 1-5 GM/200ML-% IV SOLN: 1000.0000 mg | Freq: Once | INTRAVENOUS | Status: DC

## 2023-03-13 MED ORDER — POLYETHYLENE GLYCOL 3350 17 G PO PACK
17.0000 g | PACK | Freq: Every day | ORAL | Status: DC
  Administered 2023-03-13: 17 g via ORAL
  Filled 2023-03-13 (×2): qty 1

## 2023-03-13 MED ORDER — PRESERVISION AREDS PO TABS: 250.0000 mg | ORAL_TABLET | Freq: Two times a day (BID) | ORAL | Status: DC

## 2023-03-13 MED ORDER — MAGNESIUM HYDROXIDE 400 MG/5ML PO SUSP: 30.0000 mL | Freq: Every day | ORAL | Status: DC | PRN

## 2023-03-13 MED ORDER — CARVEDILOL 6.25 MG PO TABS
25.0000 mg | ORAL_TABLET | Freq: Two times a day (BID) | ORAL | Status: DC
  Administered 2023-03-13 – 2023-03-15 (×4): 25 mg via ORAL
  Filled 2023-03-13 (×2): qty 4
  Filled 2023-03-13: qty 1
  Filled 2023-03-13: qty 4

## 2023-03-13 MED ORDER — INSULIN ASPART 100 UNIT/ML IJ SOLN
0.0000 [IU] | INTRAMUSCULAR | Status: DC
Start: 2023-03-13 — End: 2023-03-15
  Administered 2023-03-13: 3 [IU] via SUBCUTANEOUS
  Administered 2023-03-14: 2 [IU] via SUBCUTANEOUS
  Filled 2023-03-13 (×2): qty 1

## 2023-03-13 MED ORDER — CALCITRIOL 0.25 MCG PO CAPS
0.2500 ug | ORAL_CAPSULE | Freq: Every day | ORAL | Status: DC
  Administered 2023-03-13 – 2023-03-14 (×2): 0.25 ug via ORAL
  Filled 2023-03-13 (×3): qty 1

## 2023-03-13 MED ORDER — ACETAMINOPHEN 325 MG PO TABS: 650.0000 mg | ORAL_TABLET | Freq: Four times a day (QID) | ORAL | Status: DC | PRN

## 2023-03-13 MED ORDER — TRAZODONE HCL 50 MG PO TABS: 25.0000 mg | ORAL_TABLET | Freq: Every evening | ORAL | Status: DC | PRN

## 2023-03-13 MED ORDER — VANCOMYCIN HCL 500 MG/100ML IV SOLN
500.0000 mg | Freq: Once | INTRAVENOUS | Status: AC
  Administered 2023-03-13: 500 mg via INTRAVENOUS
  Filled 2023-03-13: qty 100

## 2023-03-13 MED ORDER — FOLIC ACID 1 MG PO TABS
1.0000 mg | ORAL_TABLET | Freq: Every day | ORAL | Status: DC
  Administered 2023-03-13 – 2023-03-14 (×2): 1 mg via ORAL
  Filled 2023-03-13 (×2): qty 1

## 2023-03-13 MED ORDER — VANCOMYCIN VARIABLE DOSE PER UNSTABLE RENAL FUNCTION (PHARMACIST DOSING): Status: DC

## 2023-03-13 MED ORDER — ATORVASTATIN CALCIUM 20 MG PO TABS
80.0000 mg | ORAL_TABLET | Freq: Every day | ORAL | Status: DC
  Administered 2023-03-13 – 2023-03-14 (×2): 80 mg via ORAL
  Filled 2023-03-13 (×2): qty 4

## 2023-03-13 MED ORDER — SODIUM CHLORIDE 0.9 % IV SOLN: 2.0000 g | Freq: Once | INTRAVENOUS | Status: DC

## 2023-03-13 MED ORDER — PREGABALIN 25 MG PO CAPS
25.0000 mg | ORAL_CAPSULE | Freq: Every day | ORAL | Status: DC
  Administered 2023-03-13 – 2023-03-15 (×3): 25 mg via ORAL
  Filled 2023-03-13 (×3): qty 1

## 2023-03-13 MED ORDER — FLUTICASONE FUROATE 50 MCG/ACT IN AEPB: 1.0000 | INHALATION_SPRAY | Freq: Every day | RESPIRATORY_TRACT | Status: DC

## 2023-03-13 MED ORDER — HYDROCODONE-ACETAMINOPHEN 5-325 MG PO TABS
1.0000 | ORAL_TABLET | Freq: Four times a day (QID) | ORAL | Status: DC | PRN
  Administered 2023-03-13 – 2023-03-14 (×3): 1 via ORAL
  Filled 2023-03-13 (×4): qty 1

## 2023-03-13 MED ORDER — CALCIUM ACETATE (PHOS BINDER) 667 MG PO CAPS: 667.0000 mg | ORAL_CAPSULE | ORAL | Status: DC

## 2023-03-13 NOTE — Hospital Course (Addendum)
Taken from H&P.  Jody Nelson is a 72 y.o. African-American female with medical history significant for HFpEF, end-stage renal disease on HD on MWF, GERD, dyslipidemia, and hypertension as well as type 2 diabetes mellitus, who presented to the emergency room with acute onset of left upper extremity pain with swelling, warmth erythema and tenderness since Thursday.  She denied any fever or chills.  Concern of infiltrated left brachiocephalic fistula with hematoma.  On presentation vitals and labs stable.   Upper extremity ultrasound revealed the following: 1. Left upper extremity brachiocephalic dialysis graft is identified though is not optimally assessed on this examination. Scattered images involving the proximal, mid, and distal portions of the fistula demonstrates patency with appropriate low resistance arterial waveforms, though these are not rigidly assessed on this exam. 2. Small amount of a poorly loculated perigraft fluid possibly representing perigraft edema or hemorrhage. 3. 3.6 x 2.5 x 2.6 cm loculated complex fluid collection demonstrating internal debris deep and medial to the dialysis fistula possibly representing a subcutaneous hematoma. Super infection is not excluded on this examination.  Vascular surgery was consulted.  They will do incision and drainage and also placement of a tunneled dialysis catheter on Monday in OR.  Patient was started on broad-spectrum antibiotics.  11/4: Vitals and labs seems stable.  Preliminary blood cultures negative in 12 hours.  Going for incision and drainage and a new tunneled catheter placement by vascular surgery today.  11/5: Vital stable patient had a new tunneled catheter placed by vascular surgery yesterday.  They did not did incision and drainage stating that it is likely an hematoma and will resolve on its own in the few upcoming weeks. Patient had her routine dialysis yesterday and tolerated well.  Discussed with nephrology and  decided not to continue antibiotics at this time.  They will monitor as outpatient.  Patient will follow-up with vascular surgery in next couple of weeks and they will decide about another procedure if needed.  Patient will continue on current medications and follow-up with her providers for further management.

## 2023-03-13 NOTE — ED Notes (Signed)
Scanner not working. IT ticket filed.

## 2023-03-13 NOTE — Progress Notes (Signed)
[facility] OF [place] PROGRESS NOTE  ASSESSMENT / PLAN: Jody Nelson is a 72 y.o. female with left upper extremity infiltrated fistula with hematoma. Some concern for infection given warmth on exam, but no purulent drainage. Will need tunneled dialysis catheter placement +/- I&D in OR. Will plan to do this early this week as OR schedule allows.  SUBJECTIVE: Sleeping comfortably.  OBJECTIVE: BP (!) 101/50   Pulse 79   Temp 98.4 F (36.9 C)   Resp 17   Ht 5\' 6"  (1.676 m)   SpO2 100%   BMI 45.05 kg/m   Intake/Output Summary (Last 24 hours) at 03/13/2023 1317 Last data filed at 03/13/2023 1610 Gross per 24 hour  Intake 400 ml  Output --  Net 400 ml    No distress Unchanged appearance of R arm     Latest Ref Rng & Units 03/13/2023    4:54 AM 03/12/2023   10:41 PM 07/26/2022    3:06 PM  CBC  WBC 4.0 - 10.5 K/uL 4.8  6.3  6.6   Hemoglobin 12.0 - 15.0 g/dL 8.4  9.3  8.8   Hematocrit 36.0 - 46.0 % 26.8  30.1  30.5   Platelets 150 - 400 K/uL 105  116  208         Latest Ref Rng & Units 03/13/2023    4:54 AM 03/12/2023   10:41 PM 07/26/2022    3:06 PM  CMP  Glucose 70 - 99 mg/dL 960  454  098   BUN 8 - 23 mg/dL 30  25  22    Creatinine 0.44 - 1.00 mg/dL 1.19  1.47  8.29   Sodium 135 - 145 mmol/L 135  136  135   Potassium 3.5 - 5.1 mmol/L 4.0  4.6  5.3   Chloride 98 - 111 mmol/L 99  99  99   CO2 22 - 32 mmol/L 30  28  29    Calcium 8.9 - 10.3 mg/dL 8.2  8.6  8.7   Total Protein 6.5 - 8.1 g/dL  7.1    Total Bilirubin 0.3 - 1.2 mg/dL  1.1    Alkaline Phos 38 - 126 U/L  53    AST 15 - 41 U/L  16    ALT 0 - 44 U/L  11      Vianca Bracher N. Lenell Antu, MD [facility] Phone Number: [phone] 03/13/2023 1:17 PM

## 2023-03-13 NOTE — Assessment & Plan Note (Signed)
-   We will continue statin therapy. 

## 2023-03-13 NOTE — H&P (Signed)
[place]   PATIENT NAME: Jody Nelson    MR#:  [id number]  DATE OF BIRTH:  08-25-1950  DATE OF ADMISSION:  03/12/2023  PRIMARY CARE PHYSICIAN: Patient, No Pcp Per   Patient is coming from: Home  REQUESTING/REFERRING PHYSICIAN: Alfonse Flavors, MD  CHIEF COMPLAINT:   Left upper extremity swelling and pain  HISTORY OF PRESENT ILLNESS:  Jody Nelson is a 72 y.o. African-American female with medical history significant for HFpEF, end-stage renal disease on HD on MWF, GERD, dyslipidemia, and hypertension as well as type 2 diabetes mellitus, who presented to the emergency room with acute onset of left upper extremity pain with swelling, warmth erythema and tenderness since Thursday.  She denied any fever or chills.  No nausea or vomiting or abdominal pain.  No chest pain or palpitations.  No cough or wheezing or dyspnea.  Her last hemodialysis session was on Friday.  She has not missed any session recently.  No bleeding diathesis.  ED Course: When she came to the ER, BP was 135/53 with otherwise normal vital signs.  Labs revealed a BUN of 25 with creatinine 6.43 blood glucose 166 calcium 8.6 and albumin 3.4.  CBC showed anemia better than previous levels and thrombocytopenia. EKG as reviewed by me : None Imaging: Upper extremity ultrasound revealed the following: 1. Left upper extremity brachiocephalic dialysis graft is identified though is not optimally assessed on this examination. Scattered images involving the proximal, mid, and distal portions of the fistula demonstrates patency with appropriate low resistance arterial waveforms, though these are not rigidly assessed on this exam. 2. Small amount of a poorly loculated perigraft fluid possibly representing perigraft edema or hemorrhage. 3. 3.6 x 2.5 x 2.6 cm loculated complex fluid collection demonstrating internal debris deep and medial to the dialysis fistula possibly representing a subcutaneous hematoma. Super infection  is not excluded on this examination.  The patient was given IV vancomycin and Zosyn.  Dr. Lenell Antu was notified about the patient.  She will be admitted to a medical bed for further evaluation and management.   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   At high risk for falls    Atypical chest pain    Bilateral lower extremity edema    Chronic right sided weakness    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   a.) MRI brain 02/27/2011: old lacunar infarct in RIGHT pons and medulla oblongata   Cryptogenic stroke (HCC) 10/11/2012   a.) CT brain 10/11/2012: tiny old lacunar infarct in head of caudate nucleus (left)   Cryptogenic stroke (HCC) 10/12/2012   a.) MRI brain 10/12/2012: multiple acute LEFT hemispheric infarcts   Cryptogenic stroke (HCC) 06/26/2014   a.) MRI brain 06/26/2014: acute RIGHT posterior frontal lobe infarct involving both and grey matter   Cryptogenic stroke (HCC) 07/29/2015   a.) MRI brain 07/29/2015: 2 foci of diffusion restriction in the LEFT parietal lobe consistent with acute infarction   Cryptogenic stroke (HCC) 07/24/2017   a.) MRI brain 07/24/2017: punctate focus of  acute ischemia in the medial LEFT temporal lobe   DDD (degenerative disc disease), cervical    Depression    DOE (dyspnea on exertion)    Dysarthria as late effect of stroke    ESRD (end stage renal disease) on dialysis (HCC)    a.) M-W-F   GERD (gastroesophageal reflux disease)    Hallux valgus, bilateral    Heterozygous factor V Leiden  mutation (HCC) 07/18/2018   History of cardiac catheterization 08/30/2008   a.) LHC 08/30/2008: EF >60%, LVEDP 18 mmHg, normal coronaries.   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   a.) 07/18/2018 --> 22 mol/L   Hypertension    Insomnia    a.) melatonin + trazodone PRN   Long term current use of anticoagulant    a.) apixaban   Lymphedema of both lower extremities    Meralgia paresthetica, left lower limb    OSA (obstructive sleep apnea)    a.) does not require nocturnal PAP therapy   Osteoarthritis    Pneumonia    Pseudophakia of both eyes    Sarcoidosis    Secondary hyperparathyroidism of renal origin (HCC)    Type 2 diabetes mellitus treated with insulin (HCC)    Vitamin D deficiency     PAST SURGICAL HISTORY:   Past Surgical History:  Procedure Laterality Date   A/V FISTULAGRAM Left 12/28/2022   Procedure: A/V Fistulagram;  Surgeon: Renford Dills, MD;  Location: [facility];  Service: Cardiovascular;  Laterality: Left;   APPLICATION OF WOUND VAC Right 06/15/2022   Procedure: APPLICATION OF WOUND VAC;  Surgeon: Signa Kell, MD;  Location: [facility];  Service: Orthopedics;  Laterality: Right;   AV FISTULA PLACEMENT Left 04/16/2022   Procedure: INSERTION OF ARTERIOVENOUS (AV) GORE-TEX GRAFT ARM (BRACHIAL AXILLARY);  Surgeon: Renford Dills, MD;  Location: [facility];  Service: Vascular;  Laterality: Left;   CESAREAN SECTION     DIALYSIS/PERMA CATHETER INSERTION N/A 10/14/2021   Procedure: DIALYSIS/PERMA CATHETER INSERTION;  Surgeon: Annice Needy, MD;  Location: [facility];  Service: Cardiovascular;  Laterality: N/A;   DIALYSIS/PERMA CATHETER REMOVAL N/A 08/02/2022   Procedure: DIALYSIS/PERMA CATHETER  REMOVAL;  Surgeon: Annice Needy, MD;  Location: [facility];  Service: Cardiovascular;  Laterality: N/A;   FRACTURE SURGERY     leg right fracture several surgeries, rods   INCISION AND DRAINAGE OF WOUND Right 06/15/2022   Procedure: IRRIGATION AND DEBRIDEMENT WOUND;  Surgeon: Signa Kell, MD;  Location: [facility];  Service: Orthopedics;  Laterality: Right;    SOCIAL HISTORY:   Social History   Tobacco Use   Smoking status: Former    Types: Cigarettes   Smokeless tobacco: Never  Substance Use Topics   Alcohol use: Never    FAMILY HISTORY:   Family History  Problem Relation Age of Onset   Diabetes Mother    Colon cancer Sister    Diabetes Sister    Cancer Maternal Uncle    Clotting disorder Paternal Grandmother    Colon cancer Paternal Grandfather     DRUG ALLERGIES:   Allergies  Allergen Reactions   Sulfamethoxazole-Trimethoprim     Other reaction(s): Kidney Disorder Other reaction(s): Kidney Disorder Hyperkalemia, AKI Hyperkalemia, AKI     REVIEW OF SYSTEMS:   ROS  As per history of present illness. All pertinent systems were reviewed above. Constitutional, HEENT, cardiovascular, respiratory, GI, GU, musculoskeletal, neuro, psychiatric, endocrine, integumentary and hematologic systems were reviewed and are otherwise negative/unremarkable except for positive findings mentioned above in the HPI.   MEDICATIONS AT HOME:   Prior to Admission medications   Medication Sig Start Date End Date Taking? Authorizing Provider  acetaminophen (TYLENOL) 325 MG tablet Take 2 tablets by mouth every 8 (eight) hours as needed.    [provider]  ACIDOPHILUS LACTOBACILLUS PO Take 1 capsule by mouth 3 (three) times daily.    [provider]  albuterol (PROVENTIL) (2.5 MG/3ML) 0.083% nebulizer solution Take 2.5 mg by nebulization every 6 (six) hours as needed for wheezing or shortness of breath. 10/21/22   [provider]  albuterol (VENTOLIN HFA)  108 (90 Base) MCG/ACT inhaler Inhale 1-2 puffs into the lungs every 4 (four) hours as needed for wheezing or shortness of breath.    [provider]  apixaban (ELIQUIS) 5 MG TABS tablet Take 1 tablet (5 mg total) by mouth 2 (two) times daily. 07/27/22   Delfino Lovett, MD  aspirin 81 MG chewable tablet Chew 81 mg by mouth daily.    [provider]  atorvastatin (LIPITOR) 80 MG tablet Take 80 mg by mouth daily. 02/19/21   [provider]  calcitRIOL (ROCALTROL) 0.25 MCG capsule Take 0.25 mcg by mouth daily. 10/22/21   [provider]  calcium acetate (PHOSLO) 667 MG capsule Take one capsule by mouth twice daily with food on Mondays, Wednesdays, and Fridays. Take one capsule 3 times daily on Tuesdays, Thursdays, Saturdays, and Sundays. 06/10/22   [provider]  Calcium Carb-Cholecalciferol (OYSTER SHELL CALCIUM W/D) 500-5 MG-MCG TABS Take 1 tablet by mouth daily as needed. 02/19/21   [provider]  carvedilol (COREG) 25 MG tablet Take 1 tablet by mouth 2 (two) times daily with a meal. 02/19/21   [provider]  docusate sodium (COLACE) 100 MG capsule Take 100 mg by mouth 2 (two) times daily.    [provider]  ferrous sulfate 324 (65 Fe) MG TBEC Take 1 tablet by mouth daily. 02/19/21   [provider]  fluticasone (FLONASE) 50 MCG/ACT nasal spray Place 1 spray into both nostrils daily. 10/18/22   [provider]  Fluticasone Furoate 50 MCG/ACT AEPB Inhale 1 spray into the lungs daily.    [provider]  folic acid (FOLVITE) 1 MG tablet Take 1 mg by mouth daily. 02/19/21   [provider]  gabapentin (NEURONTIN) 100 MG capsule Take 3 capsules by mouth daily. Mon, wed, and Friday 02/19/21   [provider]  gabapentin (NEURONTIN) 300 MG capsule Take 300 mg by mouth 2 (two) times daily. On sun, tue, thur, and sat    [provider]  HYDROcodone-acetaminophen (NORCO/VICODIN) 5-325 MG  tablet Take 1 tablet by mouth every 6 (six) hours as needed for moderate pain.    [provider]  insulin glargine (LANTUS) 100 UNIT/ML Solostar Pen Inject 10 Units into the skin at bedtime. 02/17/21   [provider]  insulin lispro (HUMALOG) 100 UNIT/ML injection Inject 2-12 Units into the skin in the morning, at noon, and at bedtime. Per sliding scale    [provider]  latanoprost (XALATAN) 0.005 % ophthalmic solution Place 1 drop into both eyes at bedtime. 02/08/22   [provider]  lidocaine-prilocaine (EMLA) cream Apply 1 application  topically 3 (three) times a week. On  mon, wed, and friday    [provider]  magnesium hydroxide (MILK OF MAGNESIA) 400 MG/5ML suspension Take 30 mLs by mouth daily as needed for mild constipation.    [provider]  metroNIDAZOLE (METROGEL) 1 % gel Apply topically. 02/03/22   [provider]  montelukast (SINGULAIR) 10 MG tablet Take 10 mg by mouth daily. 02/19/21   [provider]  Multiple Vitamins-Minerals (PRESERVISION AREDS) TABS Take 250 mg by mouth 2 (two) times daily.    [provider]  omeprazole (PRILOSEC) 10 MG capsule Take 10 mg by mouth daily. 02/25/22   [provider]  polyethylene glycol (MIRALAX / GLYCOLAX) 17 g packet Take 17 g by mouth daily.    [provider]  pregabalin (LYRICA) 25 MG capsule Take 25 mg by mouth daily.    [provider]  Semaglutide (OZEMPIC, 0.25 OR 0.5 MG/DOSE, [place]) Inject 0.5 mg into the skin once a week. On fridays    [provider]  torsemide (DEMADEX) 10 MG tablet Take 10 mg by mouth daily. 10/22/21   [provider]      VITAL SIGNS:  Blood pressure (!) 118/47, pulse 94, temperature 98.3 F (36.8 C), temperature source Oral, resp. rate 18, height 5\' 6"  (1.676 m), SpO2 99%.  PHYSICAL EXAMINATION:  Physical Exam  GENERAL:  72 y.o.-year-old patient lying in the bed with no acute  distress.  EYES: Pupils equal, round, reactive to light and accommodation. No scleral icterus. Extraocular muscles intact.  HEENT: Head atraumatic, normocephalic. Oropharynx and nasopharynx clear.  NECK:  Supple, no jugular venous distention. No thyroid enlargement, no tenderness.  LUNGS: Normal breath sounds bilaterally, no wheezing, rales,rhonchi or crepitation. No use of accessory muscles of respiration.  CARDIOVASCULAR: Regular rate and rhythm, S1, S2 normal. No murmurs, rubs, or gallops.  ABDOMEN: Soft, nondistended, nontender. Bowel sounds present. No organomegaly or mass.  EXTREMITIES: No pedal edema, cyanosis, or clubbing.  NEUROLOGIC: Cranial nerves II through XII are intact. Muscle strength 5/5 in all extremities. Sensation intact. Gait not checked.  PSYCHIATRIC: The patient is alert and oriented x 3.  Normal affect and good eye contact. SKIN: Swelling, warmth, tenderness with skin induration of the medial left arm close to her AV fistula.  LABORATORY PANEL:   CBC Recent Labs  Lab 03/12/23 2241  WBC 6.3  HGB 9.3*  HCT 30.1*  PLT 116*   ------------------------------------------------------------------------------------------------------------------  Chemistries  Recent Labs  Lab 03/12/23 2241  NA 136  K 4.6  CL 99  CO2 28  GLUCOSE 166*  BUN 25*  CREATININE 6.43*  CALCIUM 8.6*  AST 16  ALT 11  ALKPHOS 53  BILITOT 1.1   ------------------------------------------------------------------------------------------------------------------  Cardiac Enzymes No results for input(s): "TROPONINI" in the last 168 hours. ------------------------------------------------------------------------------------------------------------------  RADIOLOGY:  [place] Upper Ext Art Left Ltd  Result Date: 03/12/2023 CLINICAL DATA:  Left arm swelling and redness surrounding dialysis fistula EXAM: LEFT UPPER EXTREMITY ARTERIAL DUPLEX SCAN TECHNIQUE: Gray-scale sonography as well as color  Doppler and duplex ultrasound was performed to evaluate the dialysis fistula of the upper extremity. COMPARISON:  None Available. FINDINGS: Left upper extremity brachiocephalic dialysis graft is identified though is not optimally assessed on this examination. Selected images involving the proximal, mid, and distal portions of the fistula demonstrates patency with appropriate low resistance arterial waveforms, though these are not rigorously assessed on this exam. Flow volumes are not calculated on this examination. A small amount of a poorly loculated perigraft fluid is identified possibly representing  perigraft edema or hemorrhage. Deep and medial to the dialysis fistula is a loculated complex fluid collection demonstrating internal debris measuring 3.6 x 2.5 x 2.6 cm demonstrating no internal vascularity possibly representing a a subcutaneous hematoma. Super infection is not excluded on this examination. IMPRESSION: 1. Left upper extremity brachiocephalic dialysis graft is identified though is not optimally assessed on this examination. Scattered images involving the proximal, mid, and distal portions of the fistula demonstrates patency with appropriate low resistance arterial waveforms, though these are not rigidly assessed on this exam. 2. Small amount of a poorly loculated perigraft fluid possibly representing perigraft edema or hemorrhage. 3. 3.6 x 2.5 x 2.6 cm loculated complex fluid collection demonstrating internal debris deep and medial to the dialysis fistula possibly representing a subcutaneous hematoma. Super infection is not excluded on this examination. Electronically Signed   By: Helyn Numbers M.D.   On: 03/12/2023 21:16      IMPRESSION AND PLAN:  Assessment and Plan: * Abscess of left upper extremity - This could be related to infected hematoma. - The patient will be admitted to the medical-surgical bed. - Pain management will be provided. - We will continue IV antibiotic therapy with IV  vancomycin and cefepime. - Warm compresses will be applied. - Vascular surgery consult to be obtained. - Dr. Lenell Antu was notified about the patient. - We will keep her n.p.o. after midnight.  End-stage renal disease on hemodialysis (HCC) - We will obtain a nephrology consultation for follow-up on hemodialysis. - I notified Dr. Suezanne Jacquet about the patient.  Heterozygous factor V Leiden mutation (HCC) - We will holding off Xarelto and placing her on Lovenox for now pending potential surgical intervention by vascular surgery.  Type 2 diabetes mellitus with peripheral neuropathy (HCC) - The patient will be placed on supplemental coverage with NovoLog. - We will continue basal coverage. - We will continue Lyrica.  Dyslipidemia - We will continue statin therapy.    DVT prophylaxis: Lovenox. Advanced Care Planning:  Code Status: full code. Family Communication:  The plan of care was discussed in details with the patient (and family). I answered all questions. The patient agreed to proceed with the above mentioned plan. Further management will depend upon hospital course. Disposition Plan: Back to previous home environment Consults called: Nephrology and vascular surgery All the records are reviewed and case discussed with ED provider.  Status is: Inpatient  At the time of the admission, it appears that the appropriate admission status for this patient is inpatient.  This is judged to be reasonable and necessary in order to provide the required intensity of service to ensure the patient's safety given the presenting symptoms, physical exam findings and initial radiographic and laboratory data in the context of comorbid conditions.  The patient requires inpatient status due to high intensity of service, high risk of further deterioration and high frequency of surveillance required.  I certify that at the time of admission, it is my clinical judgment that the patient will require inpatient  hospital care extending more than 2 midnights.                            Dispo: The patient is from: Home              Anticipated d/c is to: Home              Patient currently is not medically stable to d/c.  Difficult to place patient: No  Hannah Beat M.D on 03/13/2023 at 4:45 AM  [facility]   From 7 PM-7 AM, contact night-coverage [email or web address]  CC: Primary care physician; Patient, No Pcp Per

## 2023-03-13 NOTE — H&P (View-Only) (Signed)
[facility] [place] PROGRESS NOTE  ASSESSMENT / PLAN: Jody Nelson is a 72 y.o. female with left upper extremity infiltrated fistula with hematoma. Some concern for infection given warmth on exam, but no purulent drainage. Will need tunneled dialysis catheter placement +/- I&D in OR. Will plan to do this early this week as OR schedule allows.  SUBJECTIVE: Sleeping comfortably.  OBJECTIVE: BP (!) 101/50   Pulse 79   Temp 98.4 F (36.9 C)   Resp 17   Ht 5\' 6"  (1.676 m)   SpO2 100%   BMI 45.05 kg/m   Intake/Output Summary (Last 24 hours) at 03/13/2023 1317 Last data filed at 03/13/2023 1610 Gross per 24 hour  Intake 400 ml  Output --  Net 400 ml    No distress Unchanged appearance of R arm     Latest Ref Rng & Units 03/13/2023    4:54 AM 03/12/2023   10:41 PM 07/26/2022    3:06 PM  CBC  WBC 4.0 - 10.5 K/uL 4.8  6.3  6.6   Hemoglobin 12.0 - 15.0 g/dL 8.4  9.3  8.8   Hematocrit 36.0 - 46.0 % 26.8  30.1  30.5   Platelets 150 - 400 K/uL 105  116  208         Latest Ref Rng & Units 03/13/2023    4:54 AM 03/12/2023   10:41 PM 07/26/2022    3:06 PM  CMP  Glucose 70 - 99 mg/dL 960  454  098   BUN 8 - 23 mg/dL 30  25  22    Creatinine 0.44 - 1.00 mg/dL 1.19  1.47  8.29   Sodium 135 - 145 mmol/L 135  136  135   Potassium 3.5 - 5.1 mmol/L 4.0  4.6  5.3   Chloride 98 - 111 mmol/L 99  99  99   CO2 22 - 32 mmol/L 30  28  29    Calcium 8.9 - 10.3 mg/dL 8.2  8.6  8.7   Total Protein 6.5 - 8.1 g/dL  7.1    Total Bilirubin 0.3 - 1.2 mg/dL  1.1    Alkaline Phos 38 - 126 U/L  53    AST 15 - 41 U/L  16    ALT 0 - 44 U/L  11      Vianca Bracher N. Lenell Antu, MD [facility] Phone Number: [phone] 03/13/2023 1:17 PM

## 2023-03-13 NOTE — ED Notes (Signed)
RN to bedside to answer call bell. Pt is requesting "strong pain meds". Pt is offered her PRN tylenol but she is refusing. RN offered hydrocodone. Pt agreed to taking that. Pt also requesting "pain cream".

## 2023-03-13 NOTE — ED Notes (Addendum)
Attempted to call Amy Freida Busman back from [facility] with no answer, and no available voicemail to leave message. Left message with secretary to inform her that this RN returned her call.

## 2023-03-13 NOTE — ED Notes (Signed)
This RN to bedside to answer call bell again. Pt is requesting to be pulled up in bed. This RN and two others assisted to pull her up.

## 2023-03-13 NOTE — ED Notes (Addendum)
Patient requesting lidocaine cream, informed pt that I will speak with DR about an order

## 2023-03-13 NOTE — Progress Notes (Signed)
Referring Provider: No ref. provider found Primary Care Physician:  Patient, No Pcp Per Primary Nephrologist:  Dr.   Jaquita Rector for Consultation: ESRD  HPI: 72 year old female with history of hypertension, coronary artery disease, congestive heart failure, diabetes, peripheral vascular disease, GERD, end-stage renal disease on dialysis on Monday Wednesday Friday schedule.  She had dialysis yesterday.  She knows comes in with acute onset of left upper extremity pain with swelling, warmth and erythema for the last 2 to 3 days.  She was evaluated by vascular.  The plan is to have a permacath placed.  She will also have I&D of the possible abscess.  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   At high risk for falls    Atypical chest pain    Bilateral lower extremity edema    Chronic right sided weakness    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   a.) MRI brain 02/27/2011: old lacunar infarct in RIGHT pons and medulla oblongata   Cryptogenic stroke (HCC) 10/11/2012   a.) CT brain 10/11/2012: tiny old lacunar infarct in head of caudate nucleus (left)   Cryptogenic stroke (HCC) 10/12/2012   a.) MRI brain 10/12/2012: multiple acute LEFT hemispheric infarcts   Cryptogenic stroke (HCC) 06/26/2014   a.) MRI brain 06/26/2014: acute RIGHT posterior frontal lobe infarct involving both and grey matter   Cryptogenic stroke (HCC) 07/29/2015   a.) MRI brain 07/29/2015: 2 foci of diffusion restriction in the LEFT parietal lobe consistent with acute infarction   Cryptogenic  stroke (HCC) 07/24/2017   a.) MRI brain 07/24/2017: punctate focus of acute ischemia in the medial LEFT temporal lobe   DDD (degenerative disc disease), cervical    Depression    DOE (dyspnea on exertion)    Dysarthria as late effect of stroke    ESRD (end stage renal disease) on dialysis (HCC)    a.) M-W-F   GERD (gastroesophageal reflux disease)    Hallux valgus, bilateral    Heterozygous factor V Leiden mutation (HCC) 07/18/2018   History of cardiac catheterization 08/30/2008   a.) LHC 08/30/2008: EF >60%, LVEDP 18 mmHg, normal coronaries.   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   a.) 07/18/2018 --> 22 mol/L   Hypertension    Insomnia    a.) melatonin + trazodone PRN   Long term current use of anticoagulant    a.) apixaban   Lymphedema of both lower extremities    Meralgia paresthetica, left lower limb    OSA (obstructive sleep apnea)    a.) does not require nocturnal PAP therapy   Osteoarthritis    Pneumonia    Pseudophakia of both eyes    Sarcoidosis    Secondary hyperparathyroidism of renal origin (HCC)    Type 2 diabetes mellitus treated with insulin (HCC)    Vitamin D deficiency     Past Surgical  History:  Procedure Laterality Date   A/V FISTULAGRAM Left 12/28/2022   Procedure: A/V Fistulagram;  Surgeon: Renford Dills, MD;  Location: [facility] INVASIVE CV LAB;  Service: Cardiovascular;  Laterality: Left;   APPLICATION OF WOUND VAC Right 06/15/2022   Procedure: APPLICATION OF WOUND VAC;  Surgeon: Signa Kell, MD;  Location: [facility] ORS;  Service: Orthopedics;  Laterality: Right;   AV FISTULA PLACEMENT Left 04/16/2022   Procedure: INSERTION OF ARTERIOVENOUS (AV) GORE-TEX GRAFT ARM (BRACHIAL AXILLARY);  Surgeon:  Renford Dills, MD;  Location: [facility] ORS;  Service: Vascular;  Laterality: Left;   CESAREAN SECTION     DIALYSIS/PERMA CATHETER INSERTION N/A 10/14/2021   Procedure: DIALYSIS/PERMA CATHETER INSERTION;  Surgeon: Annice Needy, MD;  Location: [facility] INVASIVE CV LAB;  Service: Cardiovascular;  Laterality: N/A;   DIALYSIS/PERMA CATHETER REMOVAL N/A 08/02/2022   Procedure: DIALYSIS/PERMA CATHETER REMOVAL;  Surgeon: Annice Needy, MD;  Location: [facility] INVASIVE CV LAB;  Service: Cardiovascular;  Laterality: N/A;   FRACTURE SURGERY     leg right fracture several surgeries, rods   INCISION AND DRAINAGE OF WOUND Right 06/15/2022   Procedure: IRRIGATION AND DEBRIDEMENT WOUND;  Surgeon: Signa Kell, MD;  Location: [facility] ORS;  Service: Orthopedics;  Laterality: Right;    Prior to Admission medications   Medication Sig Start Date End Date Taking? Authorizing Provider  acetaminophen (TYLENOL) 325 MG tablet Take 2 tablets by mouth every 8 (eight) hours as needed.   Yes [provider]  albuterol (PROVENTIL) (2.5 MG/3ML) 0.083% nebulizer solution Take 2.5 mg by nebulization every 6 (six) hours as needed for wheezing or shortness of breath. 10/21/22  Yes [provider]  albuterol (VENTOLIN HFA) 108 (90 Base) MCG/ACT inhaler Inhale 1-2 puffs into the lungs every 4 (four) hours as needed for wheezing or shortness of breath.   Yes [provider]  apixaban (ELIQUIS) 5 MG TABS tablet Take 1 tablet (5 mg total) by mouth 2 (two) times daily. 07/27/22  Yes Delfino Lovett, MD  aspirin 81 MG chewable tablet Chew 81 mg by mouth daily.   Yes [provider]  calcitRIOL (ROCALTROL) 0.25 MCG capsule Take 0.25 mcg by mouth daily. 10/22/21  Yes [provider]  calcium acetate (PHOSLO) 667 MG capsule Take one capsule by mouth twice daily with food on Mondays, Wednesdays, and Fridays. Take one capsule 3 times daily on Tuesdays, Thursdays, Saturdays, and Sundays. 06/10/22  Yes [provider]  Calcium Carb-Cholecalciferol (OYSTER SHELL CALCIUM W/D) 500-5 MG-MCG TABS Take 1 tablet by mouth every Tuesday, Thursday, Saturday, and Sunday. 02/19/21  Yes [provider]  carvedilol (COREG) 25 MG tablet Take 1 tablet by mouth 2 (two) times daily with a meal. 02/19/21  Yes [provider]  diclofenac Sodium (VOLTAREN) 1 % GEL Apply 2 g topically every 4 (four) hours as needed (pain). Apply bilateral hips   Yes [provider]  docusate sodium (COLACE) 100 MG capsule Take 100 mg by mouth 2 (two) times daily.   Yes [provider]  ferrous sulfate 324 (65 Fe) MG TBEC Take 1 tablet by mouth daily. 02/19/21  Yes [provider]  fluticasone (FLONASE) 50 MCG/ACT nasal spray Place 1 spray into both nostrils daily. 10/18/22  Yes [provider]  folic acid (FOLVITE) 1 MG tablet Take 1 mg by mouth daily. 02/19/21  Yes [provider]  HYDROcodone-acetaminophen (NORCO/VICODIN) 5-325 MG tablet Take 1 tablet by mouth every 6 (six) hours as needed for moderate pain.  Yes [provider]  insulin glargine (LANTUS) 100 UNIT/ML Solostar Pen Inject 10 Units into the skin at bedtime. 02/17/21  Yes [provider]  insulin lispro (HUMALOG) 100 UNIT/ML injection Inject 2-12 Units into the skin in the morning, at noon, and at bedtime. Per sliding scale   Yes [provider]  latanoprost (XALATAN) 0.005 % ophthalmic solution Place 1 drop into both eyes at bedtime. 02/08/22  Yes [provider]  lidocaine-prilocaine (EMLA) cream Apply 1 application  topically 3 (three) times a week. On mon, wed, and friday   Yes [provider]  loratadine (CLARITIN) 10 MG tablet Take 10 mg by mouth daily.   Yes [provider]  magnesium hydroxide (MILK OF MAGNESIA) 400 MG/5ML suspension Take 30 mLs by mouth daily as needed for mild constipation.   Yes [provider]  montelukast (SINGULAIR) 10 MG tablet  Take 10 mg by mouth daily. 02/19/21  Yes [provider]  Multiple Vitamins-Minerals (HEALTHY EYES SUPERVISION 2) CAPS Take 1 capsule by mouth 2 (two) times daily.   Yes [provider]  Olopatadine HCl (PATADAY) 0.7 % SOLN Place 1 drop into both eyes daily. 02/10/23 03/13/23 Yes [provider]  omeprazole (PRILOSEC) 10 MG capsule Take 10 mg by mouth daily. 02/25/22  Yes [provider]  OZEMPIC, 1 MG/DOSE, 4 MG/3ML SOPN Inject 1 mg into the skin every Friday. 02/18/23  Yes [provider]  polyethylene glycol (MIRALAX / GLYCOLAX) 17 g packet Take 17 g by mouth daily.   Yes [provider]  pregabalin (LYRICA) 25 MG capsule Take 25 mg by mouth daily.   Yes [provider]  torsemide (DEMADEX) 10 MG tablet Take 10 mg by mouth daily. 10/22/21  Yes [provider]  ACIDOPHILUS LACTOBACILLUS PO Take 1 capsule by mouth 3 (three) times daily. Patient not taking: Reported on 03/13/2023    [provider]  atorvastatin (LIPITOR) 80 MG tablet Take 80 mg by mouth daily. 02/19/21   [provider]  Fluticasone Furoate 50 MCG/ACT AEPB Inhale 1 spray into the lungs daily. Patient not taking: Reported on 03/13/2023    [provider]  gabapentin (NEURONTIN) 100 MG capsule Take 3 capsules by mouth daily. Mon, wed, and Friday Patient not taking: Reported on 03/13/2023 02/19/21   [provider]  gabapentin (NEURONTIN) 300 MG capsule Take 300 mg by mouth 2 (two) times daily. On sun, tue, thur, and sat Patient not taking: Reported on 03/13/2023    [provider]  metroNIDAZOLE (METROGEL) 1 % gel Apply topically. Patient not taking: Reported on 03/13/2023 02/03/22   [provider]  Multiple Vitamins-Minerals (PRESERVISION AREDS) TABS Take 250 mg by mouth 2 (two) times daily. Patient not taking: Reported on 03/13/2023    [provider]    Current Facility-Administered Medications   Medication Dose Route Frequency Provider Last Rate Last Admin   acetaminophen (TYLENOL) tablet 650 mg  650 mg Oral Q6H PRN Mansy, Jan A, MD       Or   acetaminophen (TYLENOL) suppository 650 mg  650 mg Rectal Q6H PRN Mansy, Jan A, MD       albuterol (PROVENTIL) (2.5 MG/3ML) 0.083% nebulizer solution 2.5 mg  2.5 mg Nebulization Q6H PRN Mansy, Jan A, MD       atorvastatin (LIPITOR) tablet 80 mg  80 mg Oral QHS Mansy, Jan A, MD       calcitRIOL (ROCALTROL) capsule 0.25 mcg  0.25 mcg Oral Daily Mansy,  Jan A, MD   0.25 mcg at 03/13/23 1105   [START ON 03/14/2023] calcium acetate (PHOSLO) capsule 667 mg  667 mg Oral 2 times per day on Monday Wednesday Friday Otelia Sergeant, [place]       And   calcium acetate [facility]) capsule 667 mg  667 mg Oral 3 times per day on Sunday Tuesday Thursday Saturday Otelia Sergeant, [place]   667 mg at 03/13/23 1032   carvedilol (COREG) tablet 25 mg  25 mg Oral BID WC Mansy, Jan A, MD   25 mg at 03/13/23 1032   ceFEPIme (MAXIPIME) 1 g in sodium chloride 0.9 % 100 mL IVPB  1 g Intravenous Daily Otelia Sergeant, RPH   Stopped at 03/13/23 1033   docusate sodium (COLACE) capsule 100 mg  100 mg Oral BID Mansy, Jan A, MD   100 mg at 03/13/23 1106   ferrous sulfate tablet 325 mg  325 mg Oral Q breakfast Mansy, Jan A, MD   325 mg at 03/13/23 1032   fluticasone (FLONASE) 50 MCG/ACT nasal spray 1 spray  1 spray Each Nare Daily Mansy, Jan A, MD       folic acid (FOLVITE) tablet 1 mg  1 mg Oral Daily Mansy, Jan A, MD   1 mg at 03/13/23 1105   heparin injection 5,000 Units  5,000 Units Subcutaneous Q8H Mansy, Jan A, MD   5,000 Units at 03/13/23 0140   HYDROcodone-acetaminophen (NORCO/VICODIN) 5-325 MG per tablet 1 tablet  1 tablet Oral Q6H PRN Mansy, Jan A, MD   1 tablet at 03/13/23 0140   insulin aspart (novoLOG) injection 0-15 Units  0-15 Units Subcutaneous Q4H Mansy, Jan A, MD   3 Units at 03/13/23 0509   insulin glargine-yfgn [facility]) injection 10 Units  10 Units Subcutaneous QHS Mansy,  Jan A, MD   10 Units at 03/13/23 0320   latanoprost (XALATAN) 0.005 % ophthalmic solution 1 drop  1 drop Both Eyes QHS Mansy, Jan A, MD       magnesium hydroxide (MILK OF MAGNESIA) suspension 30 mL  30 mL Oral Daily PRN Mansy, Jan A, MD       montelukast (SINGULAIR) tablet 10 mg  10 mg Oral QHS Mansy, Jan A, MD       morphine (PF) 2 MG/ML injection 2 mg  2 mg Intravenous Q4H PRN Otelia Sergeant, RPH       ondansetron [facility]) tablet 4 mg  4 mg Oral Q6H PRN Mansy, Jan A, MD       Or   ondansetron [facility]) injection 4 mg  4 mg Intravenous Q6H PRN Mansy, Jan A, MD       pantoprazole (PROTONIX) EC tablet 40 mg  40 mg Oral Daily Mansy, Jan A, MD   40 mg at 03/13/23 1105   polyethylene glycol (MIRALAX / GLYCOLAX) packet 17 g  17 g Oral Daily Mansy, Jan A, MD   17 g at 03/13/23 1105   pregabalin (LYRICA) capsule 25 mg  25 mg Oral Daily Mansy, Jan A, MD   25 mg at 03/13/23 1106   torsemide (DEMADEX) tablet 10 mg  10 mg Oral Daily Mansy, Jan A, MD   10 mg at 03/13/23 1105   traZODone (DESYREL) tablet 25 mg  25 mg Oral QHS PRN Mansy, Jan A, MD       vancomycin variable dose per unstable renal function (pharmacist dosing)   Does not apply See admin instructions Otelia Sergeant, [facility]  Current Outpatient Medications  Medication Sig Dispense Refill   acetaminophen (TYLENOL) 325 MG tablet Take 2 tablets by mouth every 8 (eight) hours as needed.     albuterol (PROVENTIL) (2.5 MG/3ML) 0.083% nebulizer solution Take 2.5 mg by nebulization every 6 (six) hours as needed for wheezing or shortness of breath.     albuterol (VENTOLIN HFA) 108 (90 Base) MCG/ACT inhaler Inhale 1-2 puffs into the lungs every 4 (four) hours as needed for wheezing or shortness of breath.     apixaban (ELIQUIS) 5 MG TABS tablet Take 1 tablet (5 mg total) by mouth 2 (two) times daily. 60 tablet    aspirin 81 MG chewable tablet Chew 81 mg by mouth daily.     calcitRIOL (ROCALTROL) 0.25 MCG capsule Take 0.25 mcg by mouth daily.      calcium acetate (PHOSLO) 667 MG capsule Take one capsule by mouth twice daily with food on Mondays, Wednesdays, and Fridays. Take one capsule 3 times daily on Tuesdays, Thursdays, Saturdays, and Sundays.     Calcium Carb-Cholecalciferol (OYSTER SHELL CALCIUM W/D) 500-5 MG-MCG TABS Take 1 tablet by mouth every Tuesday, Thursday, Saturday, and Sunday.     carvedilol (COREG) 25 MG tablet Take 1 tablet by mouth 2 (two) times daily with a meal.     diclofenac Sodium (VOLTAREN) 1 % GEL Apply 2 g topically every 4 (four) hours as needed (pain). Apply bilateral hips     docusate sodium (COLACE) 100 MG capsule Take 100 mg by mouth 2 (two) times daily.     ferrous sulfate 324 (65 Fe) MG TBEC Take 1 tablet by mouth daily.     fluticasone (FLONASE) 50 MCG/ACT nasal spray Place 1 spray into both nostrils daily.     folic acid (FOLVITE) 1 MG tablet Take 1 mg by mouth daily.     HYDROcodone-acetaminophen (NORCO/VICODIN) 5-325 MG tablet Take 1 tablet by mouth every 6 (six) hours as needed for moderate pain.     insulin glargine (LANTUS) 100 UNIT/ML Solostar Pen Inject 10 Units into the skin at bedtime.     insulin lispro (HUMALOG) 100 UNIT/ML injection Inject 2-12 Units into the skin in the morning, at noon, and at bedtime. Per sliding scale     latanoprost (XALATAN) 0.005 % ophthalmic solution Place 1 drop into both eyes at bedtime.     lidocaine-prilocaine (EMLA) cream Apply 1 application  topically 3 (three) times a week. On mon, wed, and friday     loratadine (CLARITIN) 10 MG tablet Take 10 mg by mouth daily.     magnesium hydroxide (MILK OF MAGNESIA) 400 MG/5ML suspension Take 30 mLs by mouth daily as needed for mild constipation.     montelukast (SINGULAIR) 10 MG tablet Take 10 mg by mouth daily.     Multiple Vitamins-Minerals (HEALTHY EYES SUPERVISION 2) CAPS Take 1 capsule by mouth 2 (two) times daily.     Olopatadine HCl (PATADAY) 0.7 % SOLN Place 1 drop into both eyes daily.     omeprazole (PRILOSEC) 10  MG capsule Take 10 mg by mouth daily.     OZEMPIC, 1 MG/DOSE, 4 MG/3ML SOPN Inject 1 mg into the skin every Friday.     polyethylene glycol (MIRALAX / GLYCOLAX) 17 g packet Take 17 g by mouth daily.     pregabalin (LYRICA) 25 MG capsule Take 25 mg by mouth daily.     torsemide (DEMADEX) 10 MG tablet Take 10 mg by mouth daily.     ACIDOPHILUS LACTOBACILLUS PO  Take 1 capsule by mouth 3 (three) times daily. (Patient not taking: Reported on 03/13/2023)     atorvastatin (LIPITOR) 80 MG tablet Take 80 mg by mouth daily.     Fluticasone Furoate 50 MCG/ACT AEPB Inhale 1 spray into the lungs daily. (Patient not taking: Reported on 03/13/2023)     gabapentin (NEURONTIN) 100 MG capsule Take 3 capsules by mouth daily. Mon, wed, and Friday (Patient not taking: Reported on 03/13/2023)     gabapentin (NEURONTIN) 300 MG capsule Take 300 mg by mouth 2 (two) times daily. On sun, tue, thur, and sat (Patient not taking: Reported on 03/13/2023)     metroNIDAZOLE (METROGEL) 1 % gel Apply topically. (Patient not taking: Reported on 03/13/2023)     Multiple Vitamins-Minerals (PRESERVISION AREDS) TABS Take 250 mg by mouth 2 (two) times daily. (Patient not taking: Reported on 03/13/2023)      Allergies as of 03/12/2023 - Review Complete 03/12/2023  Allergen Reaction Noted   Sulfamethoxazole-trimethoprim  07/02/2018    Family History  Problem Relation Age of Onset   Diabetes Mother    Colon cancer Sister    Diabetes Sister    Cancer Maternal Uncle    Clotting disorder Paternal Grandmother    Colon cancer Paternal Grandfather     Social History   Socioeconomic History   Marital status: Widowed    Spouse name: Not on file   Number of children: Not on file   Years of education: Not on file   Highest education level: Not on file  Occupational History   Not on file  Tobacco Use   Smoking status: Former    Types: Cigarettes   Smokeless tobacco: Never  Substance and Sexual Activity   Alcohol use: Never   Drug  use: Never   Sexual activity: Not on file  Other Topics Concern   Not on file  Social History Narrative   Not on file   Social Determinants of Health   Financial Resource Strain: Medium Risk (02/02/2021)   Received from [facility], [facility]   Overall Financial Resource Strain (CARDIA)    Difficulty of Paying Living Expenses: Somewhat hard  Food Insecurity: No Food Insecurity (07/23/2022)   Hunger Vital Sign    Worried About Running Out of Food in the Last Year: Never true    Ran Out of Food in the Last Year: Never true  Transportation Needs: No Transportation Needs (07/23/2022)   PRAPARE - Administrator, Civil Service (Medical): No    Lack of Transportation (Non-Medical): No  Physical Activity: Unknown (07/17/2017)   Received from [facility], [facility]   Exercise Vital Sign    Days of Exercise per Week: Patient declined    Minutes of Exercise per Session: Patient declined  Stress: No Stress Concern Present (11/01/2018)   Received from [facility], [facility]   [employer] of Occupational Health - Occupational Stress Questionnaire    Feeling of Stress : Not at all  Social Connections: Moderately Integrated (11/01/2018)   Received from [facility], [facility]   Social Connection and Isolation Panel [NHANES]    Frequency of Communication with Friends and Family: More than three times a week    Frequency of Social Gatherings with Friends and Family: More than three times a week    Attends Religious Services: More than 4 times per year    Active Member of  Clubs or Organizations: Yes    Attends Banker Meetings: More than 4 times per year    Marital Status: Widowed  Intimate Partner Violence: Not At Risk (07/23/2022)   Humiliation, Afraid, Rape, and Kick questionnaire    Fear of Current or Ex-Partner: No     Emotionally Abused: No    Physically Abused: No    Sexually Abused: No    Physical Exam: Vital signs in last 24 hours: Temp:  [97.9 F (36.6 C)-98.7 F (37.1 C)] 98.4 F (36.9 C) (11/03 1213) Pulse Rate:  [79-94] 79 (11/03 1213) Resp:  [16-18] 17 (11/03 1213) BP: (90-135)/(47-77) 101/50 (11/03 1213) SpO2:  [95 %-100 %] 100 % (11/03 1213) Last BM Date : 03/12/23 General:   Alert,  Well-developed, well-nourished, pleasant and cooperative in NAD Head:  Normocephalic and atraumatic. Eyes:  Sclera clear, no icterus.   Conjunctiva pink. Ears:  Normal auditory acuity. Nose:  No deformity, discharge,  or lesions. Lungs:  Clear throughout to auscultation.   No wheezes, crackles, or rhonchi. No acute distress. Heart:  Regular rate and rhythm; no murmurs, clicks, rubs,  or gallops. Abdomen:  Soft, nontender and nondistended. No masses, hepatosplenomegaly or hernias noted. Normal bowel sounds, without guarding, and without rebound.   Extremities:  Without clubbing or edema.  Intake/Output from previous day: 11/02 0701 - 11/03 0700 In: 400 [IV Piggyback:400] Out: -  Intake/Output this shift: No intake/output data recorded.  Lab Results: Recent Labs    03/12/23 2241 03/13/23 0454  WBC 6.3 4.8  HGB 9.3* 8.4*  HCT 30.1* 26.8*  PLT 116* 105*   BMET Recent Labs    03/12/23 2241 03/13/23 0454  NA 136 135  K 4.6 4.0  CL 99 99  CO2 28 30  GLUCOSE 166* 186*  BUN 25* 30*  CREATININE 6.43* 6.93*  CALCIUM 8.6* 8.2*   LFT Recent Labs    03/12/23 2241  PROT 7.1  ALBUMIN 3.4*  AST 16  ALT 11  ALKPHOS 53  BILITOT 1.1   PT/INR Recent Labs    03/13/23 0630  LABPROT 19.4*  INR 1.6*   Hepatitis Panel No results for input(s): "HEPBSAG", "HCVAB", "HEPAIGM", "HEPBIGM" in the last 72 hours.  Studies/Results: [place] Upper Ext Art Left Ltd  Result Date: 03/12/2023 CLINICAL DATA:  Left arm swelling and redness surrounding dialysis fistula EXAM: LEFT UPPER EXTREMITY ARTERIAL  DUPLEX SCAN TECHNIQUE: Gray-scale sonography as well as color Doppler and duplex ultrasound was performed to evaluate the dialysis fistula of the upper extremity. COMPARISON:  None Available. FINDINGS: Left upper extremity brachiocephalic dialysis graft is identified though is not optimally assessed on this examination. Selected images involving the proximal, mid, and distal portions of the fistula demonstrates patency with appropriate low resistance arterial waveforms, though these are not rigorously assessed on this exam. Flow volumes are not calculated on this examination. A small amount of a poorly loculated perigraft fluid is identified possibly representing perigraft edema or hemorrhage. Deep and medial to the dialysis fistula is a loculated complex fluid collection demonstrating internal debris measuring 3.6 x 2.5 x 2.6 cm demonstrating no internal vascularity possibly representing a a subcutaneous hematoma. Super infection is not excluded on this examination. IMPRESSION: 1. Left upper extremity brachiocephalic dialysis graft is identified though is not optimally assessed on this examination. Scattered images involving the proximal, mid, and distal portions of the fistula demonstrates patency with appropriate low resistance arterial waveforms, though these are not rigidly assessed on this exam. 2. Small amount of  a poorly loculated perigraft fluid possibly representing perigraft edema or hemorrhage. 3. 3.6 x 2.5 x 2.6 cm loculated complex fluid collection demonstrating internal debris deep and medial to the dialysis fistula possibly representing a subcutaneous hematoma. Super infection is not excluded on this examination. Electronically Signed   By: Helyn Numbers M.D.   On: 03/12/2023 21:16    Assessment/Plan:   72 year old female with history of hypertension, coronary artery disease, congestive heart failure, diabetes, peripheral vascular disease, GERD, end-stage renal disease on dialysis on Monday  Wednesday Friday schedule.  She had dialysis yesterday.  She knows comes in with acute onset of left upper extremity pain with swelling, warmth and erythema for the last 2 to 3 days.  She was evaluated by vascular.  The plan is to have a permacath placed.  She will also have I&D of the possible abscess.  ESRD: Infected AV graft.  Permacath placement prior to next dialysis treatment.  Will order dialysis.  ANEMIA: Recent hemoglobin level is 8.4.  Will continue to follow anemia protocols.  MBD: We will check PTH, calcium and phosphorus levels.  Continue calcitriol and calcium acetate.  HTN/VOL: Continue carvedilol and diuretics.  Sepsis: Patient is on Zosyn and also vancomycin.  Diabetes: Continue insulin as per protocol.  Labs and medications reviewed. Will continue to monitor closely.    LOS: 1 Lorain Childes, MD [facility] @TODAY @2 :11 PM

## 2023-03-13 NOTE — ED Notes (Signed)
Patient sleeping no acute distress noted patient wakes up and oriented when speaking to them

## 2023-03-13 NOTE — Progress Notes (Signed)
Progress Note   Patient: Jody Nelson [id number]:[id number] DOB: 10-15-1950 DOA: 03/12/2023     1 DOS: the patient was seen and examined on 03/13/2023   Brief hospital course: Taken from H&P.  Keily Lepp is a 72 y.o. African-American female with medical history significant for HFpEF, end-stage renal disease on HD on MWF, GERD, dyslipidemia, and hypertension as well as type 2 diabetes mellitus, who presented to the emergency room with acute onset of left upper extremity pain with swelling, warmth erythema and tenderness since Thursday.  She denied any fever or chills.  Concern of infiltrated left brachiocephalic fistula with hematoma.  On presentation vitals and labs stable.   Upper extremity ultrasound revealed the following: 1. Left upper extremity brachiocephalic dialysis graft is identified though is not optimally assessed on this examination. Scattered images involving the proximal, mid, and distal portions of the fistula demonstrates patency with appropriate low resistance arterial waveforms, though these are not rigidly assessed on this exam. 2. Small amount of a poorly loculated perigraft fluid possibly representing perigraft edema or hemorrhage. 3. 3.6 x 2.5 x 2.6 cm loculated complex fluid collection demonstrating internal debris deep and medial to the dialysis fistula possibly representing a subcutaneous hematoma. Super infection is not excluded on this examination.  Vascular surgery was consulted.  They will do incision and drainage and also placement of a tunneled dialysis catheter on Monday in OR.  Patient was started on broad-spectrum antibiotics.  11/3: Vitals and labs seems stable.  Preliminary blood cultures negative in 12 hours.      Assessment and Plan: * Abscess of left upper extremity Likely secondary to infiltrated left brachiocephalic fistula and hematoma.  Concern of some superadded infection due to tenderness and hyperthermia in that area. Vascular surgery was  consulted and she will take her to the OR for incision, drainage and a tunnel catheter placement for tomorrow morning. -Continue with antibiotics for now -Continue with supportive care  End-stage renal disease on hemodialysis [facility] Nephrology was consulted.  No missed dialysis, on MWF routine.   Heterozygous factor V Leiden mutation (HCC) - We will holding off Xarelto and placing her on Lovenox for now pending potential surgical intervention by vascular surgery.  Type 2 diabetes mellitus with peripheral neuropathy (HCC) - The patient will be placed on supplemental coverage with NovoLog. - We will continue basal coverage. - We will continue Lyrica.  Dyslipidemia - We will continue statin therapy.       Subjective: Patient continued to have pain in left upper extremity.  No fever or chills  Physical Exam: Vitals:   03/13/23 0400 03/13/23 0509 03/13/23 0745 03/13/23 1213  BP: (!) 118/47  90/70 (!) 101/50  Pulse: 94  81 79  Resp: 18  17 17   Temp:  98.7 F (37.1 C) 97.9 F (36.6 C) 98.4 F (36.9 C)  TempSrc:  Oral Oral   SpO2: 99%  98% 100%  Height:       General.  Obese lady, in no acute distress. Pulmonary.  Lungs clear bilaterally, normal respiratory effort. CV.  Regular rate and rhythm, no JVD, rub or murmur. Abdomen.  Soft, nontender, nondistended, BS positive. CNS.  Alert and oriented .  No focal neurologic deficit. Extremities.  No edema, no cyanosis, pulses intact and symmetrical.  Left upper extremity with significant edema, tenderness and hyperthermia at brachiocephalic fistula site Psychiatry.  Judgment and insight appears normal.   Data Reviewed: Prior data reviewed  Family Communication: No family at bedside  Disposition: Status is: Inpatient  Remains inpatient appropriate because: Severity of illness  Planned Discharge Destination: Home  DVT prophylaxis.  Subcu heparin Time spent:  minutes  This record has been created using Manufacturing engineer. Errors have been sought and corrected,but may not always be located. Such creation errors do not reflect on the standard of care.   Author: Arnetha Courser, MD 03/13/2023 12:20 PM  For on call review [email or web address].

## 2023-03-13 NOTE — Assessment & Plan Note (Signed)
-   We will holding off Xarelto and placing her on Lovenox for now pending potential surgical intervention by vascular surgery.

## 2023-03-13 NOTE — ED Notes (Signed)
Called dietary to confirm that pt's breakfast tray was coming to the unit. Told it was on the way up by TIA in dietary.

## 2023-03-13 NOTE — Assessment & Plan Note (Addendum)
Nephrology was consulted.  No missed dialysis, on MWF routine.

## 2023-03-13 NOTE — Assessment & Plan Note (Addendum)
-  The patient will be placed on supplemental coverage with NovoLog. - We will continue basal coverage. - We will continue Lyrica.

## 2023-03-13 NOTE — ED Notes (Signed)
RN to bedside to answer call bell multiple times. Pt is complaining that the air is blowing on her from above. This RN advised I cannot fix that for her.

## 2023-03-13 NOTE — Assessment & Plan Note (Addendum)
Likely secondary to infiltrated left brachiocephalic fistula and hematoma.  Concern of some superadded infection due to tenderness and hyperthermia in that area. Vascular surgery was consulted and she will take her to the OR for incision, drainage and a tunnel catheter placement for tomorrow morning. -Continue with antibiotics for now -Continue with supportive care

## 2023-03-13 NOTE — Progress Notes (Signed)
Pharmacy Antibiotic Note  Jody Nelson is a 72 y.o. female w/ ESRD on MWF HD admitted on 03/12/2023 with cellulitis.  Pharmacy has been consulted for Cefepime & Vancomycin dosing for 7 days.  Plan: Cefepime 1 gm q24hr per indication & ESRD on HD status.  Pt given Vancomycin 2500 mg once based on TBW > 100 kg. Variable Vancomycin dosing by pharmacy order placed.  Pharmacy will continue to follow and will adjust abx dosing whenever warranted.  Temp (24hrs), Avg:98.2 F (36.8 C), Min:98.1 F (36.7 C), Max:98.3 F (36.8 C)   Recent Labs  Lab 03/12/23 2241  WBC 6.3  CREATININE 6.43*    CrCl cannot be calculated (Unknown ideal weight.).    Allergies  Allergen Reactions   Sulfamethoxazole-Trimethoprim     Other reaction(s): Kidney Disorder Other reaction(s): Kidney Disorder Hyperkalemia, AKI Hyperkalemia, AKI     Antimicrobials this admission: 11/02 Zosyn >> x 1 dose 11/03 Cefepime >> x 7 days 11/03 Vancomycin >> x 7 days  Microbiology results: 11/02 BCx: Pending  Thank you for allowing pharmacy to be a part of this patient's care.  Otelia Sergeant, PharmD, MBA 03/13/2023 1:12 AM

## 2023-03-14 ENCOUNTER — Encounter: Payer: Self-pay | Admitting: Family Medicine

## 2023-03-14 ENCOUNTER — Encounter
Admission: EM | Disposition: A | Discharge status: Skilled Nursing Facility | Payer: Self-pay | Source: Skilled Nursing Facility | Attending: Family Medicine

## 2023-03-14 DIAGNOSIS — T82898A Other specified complication of vascular prosthetic devices, implants and grafts, initial encounter: Secondary | ICD-10-CM

## 2023-03-14 DIAGNOSIS — D6851 Activated protein C resistance: Secondary | ICD-10-CM | POA: Diagnosis not present

## 2023-03-14 DIAGNOSIS — Z992 Dependence on renal dialysis: Secondary | ICD-10-CM | POA: Diagnosis not present

## 2023-03-14 DIAGNOSIS — L02414 Cutaneous abscess of left upper limb: Secondary | ICD-10-CM | POA: Diagnosis not present

## 2023-03-14 DIAGNOSIS — E1142 Type 2 diabetes mellitus with diabetic polyneuropathy: Secondary | ICD-10-CM | POA: Diagnosis not present

## 2023-03-14 DIAGNOSIS — N186 End stage renal disease: Secondary | ICD-10-CM | POA: Diagnosis not present

## 2023-03-14 HISTORY — PX: DIALYSIS/PERMA CATHETER INSERTION: CATH118288

## 2023-03-14 LAB — CBG MONITORING, ED
Glucose-Capillary: 105 mg/dL — ABNORMAL HIGH (ref 70–99)
Glucose-Capillary: 109 mg/dL — ABNORMAL HIGH (ref 70–99)
Glucose-Capillary: 109 mg/dL — ABNORMAL HIGH (ref 70–99)
Glucose-Capillary: 131 mg/dL — ABNORMAL HIGH (ref 70–99)
Glucose-Capillary: 143 mg/dL — ABNORMAL HIGH (ref 70–99)
Glucose-Capillary: 54 mg/dL — ABNORMAL LOW (ref 70–99)
Glucose-Capillary: 92 mg/dL (ref 70–99)

## 2023-03-14 LAB — CBC
HCT: 30.6 % — ABNORMAL LOW (ref 36.0–46.0)
Hemoglobin: 9.5 g/dL — ABNORMAL LOW (ref 12.0–15.0)
MCH: 30.7 pg (ref 26.0–34.0)
MCHC: 31 g/dL (ref 30.0–36.0)
MCV: 99 fL (ref 80.0–100.0)
Platelets: 128 10*3/uL — ABNORMAL LOW (ref 150–400)
RBC: 3.09 MIL/uL — ABNORMAL LOW (ref 3.87–5.11)
RDW: 13.1 % (ref 11.5–15.5)
WBC: 5.3 10*3/uL (ref 4.0–10.5)
nRBC: 0 % (ref 0.0–0.2)

## 2023-03-14 LAB — HEPATITIS B SURFACE ANTIGEN: Hepatitis B Surface Ag: NONREACTIVE

## 2023-03-14 SURGERY — DIALYSIS/PERMA CATHETER INSERTION
Anesthesia: Moderate Sedation

## 2023-03-14 MED ORDER — ALTEPLASE 2 MG IJ SOLR
2.0000 mg | Freq: Once | INTRAMUSCULAR | Status: DC | PRN
Start: 2023-03-14 — End: 2023-03-15

## 2023-03-14 MED ORDER — VANCOMYCIN HCL IN DEXTROSE 1-5 GM/200ML-% IV SOLN
1000.0000 mg | Freq: Once | INTRAVENOUS | Status: AC
  Administered 2023-03-14: 1000 mg via INTRAVENOUS
  Filled 2023-03-14: qty 200

## 2023-03-14 MED ORDER — HEPARIN SODIUM (PORCINE) 1000 UNIT/ML DIALYSIS
1000.0000 [IU] | INTRAMUSCULAR | Status: DC | PRN
Start: 2023-03-14 — End: 2023-03-15

## 2023-03-14 MED ORDER — SODIUM CHLORIDE 0.9 % IV SOLN: INTRAVENOUS | Status: DC

## 2023-03-14 MED ORDER — FENTANYL CITRATE (PF) 100 MCG/2ML IJ SOLN
INTRAMUSCULAR | Status: AC
  Filled 2023-03-14: qty 2

## 2023-03-14 MED ORDER — DIPHENHYDRAMINE HCL 50 MG/ML IJ SOLN: 50.0000 mg | Freq: Once | INTRAMUSCULAR | Status: DC | PRN

## 2023-03-14 MED ORDER — HEPARIN SODIUM (PORCINE) 10000 UNIT/ML IJ SOLN
INTRAMUSCULAR | Status: AC
  Filled 2023-03-14: qty 1

## 2023-03-14 MED ORDER — HEPARIN (PORCINE) IN NACL 1000-0.9 UT/500ML-% IV SOLN
INTRAVENOUS | Status: DC | PRN
  Administered 2023-03-14: 500 mL

## 2023-03-14 MED ORDER — FENTANYL CITRATE PF 50 MCG/ML IJ SOSY: 12.5000 ug | PREFILLED_SYRINGE | Freq: Once | INTRAMUSCULAR | Status: DC | PRN

## 2023-03-14 MED ORDER — MIDAZOLAM HCL 2 MG/2ML IJ SOLN
INTRAMUSCULAR | Status: AC
  Filled 2023-03-14: qty 2

## 2023-03-14 MED ORDER — MIDAZOLAM HCL 2 MG/ML PO SYRP: 8.0000 mg | ORAL_SOLUTION | Freq: Once | ORAL | Status: DC | PRN

## 2023-03-14 MED ORDER — MIDAZOLAM HCL 2 MG/2ML IJ SOLN
INTRAMUSCULAR | Status: DC | PRN
  Administered 2023-03-14: .5 mg via INTRAVENOUS
  Administered 2023-03-14: 1 mg via INTRAVENOUS

## 2023-03-14 MED ORDER — METHYLPREDNISOLONE SODIUM SUCC 125 MG IJ SOLR: 125.0000 mg | Freq: Once | INTRAMUSCULAR | Status: DC | PRN

## 2023-03-14 MED ORDER — HEPARIN SODIUM (PORCINE) 10000 UNIT/ML IJ SOLN
INTRAMUSCULAR | Status: DC | PRN
  Administered 2023-03-14: 10000 [IU]

## 2023-03-14 MED ORDER — LIDOCAINE-EPINEPHRINE (PF) 1 %-1:200000 IJ SOLN
INTRAMUSCULAR | Status: DC | PRN
  Administered 2023-03-14: 20 mL via INTRADERMAL

## 2023-03-14 MED ORDER — CHLORHEXIDINE GLUCONATE CLOTH 2 % EX PADS
6.0000 | MEDICATED_PAD | Freq: Every day | CUTANEOUS | Status: DC
  Filled 2023-03-14: qty 6

## 2023-03-14 MED ORDER — FAMOTIDINE 20 MG PO TABS: 40.0000 mg | ORAL_TABLET | Freq: Once | ORAL | Status: DC | PRN

## 2023-03-14 MED ORDER — CEFAZOLIN SODIUM-DEXTROSE 1-4 GM/50ML-% IV SOLN: 1.0000 g | INTRAVENOUS | Status: DC

## 2023-03-14 MED ORDER — FENTANYL CITRATE (PF) 100 MCG/2ML IJ SOLN
INTRAMUSCULAR | Status: DC | PRN
  Administered 2023-03-14: 50 ug via INTRAVENOUS
  Administered 2023-03-14: 25 ug via INTRAVENOUS

## 2023-03-14 SURGICAL SUPPLY — 8 items
ADH SKN CLS APL DERMABOND .7 (GAUZE/BANDAGES/DRESSINGS) ×1
CANNULA 5F STIFF (CANNULA) IMPLANT
CATH CANNON HEMO 15FR 23CM (HEMODIALYSIS SUPPLIES) IMPLANT
COVER PROBE ULTRASOUND 5X96 (MISCELLANEOUS) IMPLANT
DERMABOND ADVANCED .7 DNX12 (GAUZE/BANDAGES/DRESSINGS) IMPLANT
PACK ANGIOGRAPHY (CUSTOM PROCEDURE TRAY) IMPLANT
SUT MNCRL AB 4-0 PS2 18 (SUTURE) IMPLANT
SUT PROLENE 0 CT 1 30 (SUTURE) IMPLANT

## 2023-03-14 NOTE — Assessment & Plan Note (Signed)
Likely secondary to infiltrated left brachiocephalic fistula and hematoma.  Concern of some superadded infection due to tenderness and hyperthermia in that area. Vascular surgery was consulted and she will be going to the OR for incision, drainage and a tunnel catheter placement today -Continue with antibiotics for now -Continue with supportive care

## 2023-03-14 NOTE — ED Notes (Signed)
Nephrology at bedside

## 2023-03-14 NOTE — Interval H&P Note (Signed)
History and Physical Interval Note:  03/14/2023 12:35 PM  Jody Nelson  has presented today for surgery, with the diagnosis of ESRD.  The various methods of treatment have been discussed with the patient and family. After consideration of risks, benefits and other options for treatment, the patient has consented to  Procedure(s): DIALYSIS/PERMA CATHETER INSERTION (N/A) as a surgical intervention.  The patient's history has been reviewed, patient examined, no change in status, stable for surgery.  I have reviewed the patient's chart and labs.  Questions were answered to the patient's satisfaction.     Festus Barren

## 2023-03-14 NOTE — Progress Notes (Signed)
Hemodialysis note  Received patient in bed to unit. Alert and oriented.  Informed consent signed and in chart.  Treatment initiated: 1516 Treatment completed: 1907  Patient tolerated well. Transported back to room, alert without acute distress.  Report given to patient's RN.   Access used: Left Chest HD Catheter Access issues: none  Total UF removed: 2.5L Medication(s) given:  none  Post HD weight: 137 kg   [facility]

## 2023-03-14 NOTE — ED Notes (Signed)
Called and gave daughter Karoline Caldwell) an update with patient's approval

## 2023-03-14 NOTE — ED Notes (Signed)
Cbg 54 - provider notified.  Given snack and juice.

## 2023-03-14 NOTE — ED Notes (Signed)
Dialysis Report: Pt had 3.5 hour dialysis with 2.5L removed. Pt A&O x4. Per Dialysis unit RN, pt had 1 episode of hypotension at the completion of dialysis but has since returned to normal limits and remained.

## 2023-03-14 NOTE — Progress Notes (Signed)
Progress Note   Patient: Jody Nelson ZHY:[id number] DOB: [birth year] DOA: 03/12/2023     2 DOS: the patient was seen and examined on 03/14/2023   Brief hospital course: Taken from H&P.  Ellanore Vanhook is a 72 y.o. African-American female with medical history significant for HFpEF, end-stage renal disease on HD on MWF, GERD, dyslipidemia, and hypertension as well as type 2 diabetes mellitus, who presented to the emergency room with acute onset of left upper extremity pain with swelling, warmth erythema and tenderness since Thursday.  She denied any fever or chills.  Concern of infiltrated left brachiocephalic fistula with hematoma.  On presentation vitals and labs stable.   Upper extremity ultrasound revealed the following: 1. Left upper extremity brachiocephalic dialysis graft is identified though is not optimally assessed on this examination. Scattered images involving the proximal, mid, and distal portions of the fistula demonstrates patency with appropriate low resistance arterial waveforms, though these are not rigidly assessed on this exam. 2. Small amount of a poorly loculated perigraft fluid possibly representing perigraft edema or hemorrhage. 3. 3.6 x 2.5 x 2.6 cm loculated complex fluid collection demonstrating internal debris deep and medial to the dialysis fistula possibly representing a subcutaneous hematoma. Super infection is not excluded on this examination.  Vascular surgery was consulted.  They will do incision and drainage and also placement of a tunneled dialysis catheter on Monday in OR.  Patient was started on broad-spectrum antibiotics.  11/3: Vitals and labs seems stable.  Preliminary blood cultures negative in 12 hours.  Going for incision and drainage and a new tunneled catheter placement by vascular surgery today.    Assessment and Plan: * Abscess of left upper extremity Likely secondary to infiltrated left brachiocephalic fistula and hematoma.  Concern of  some superadded infection due to tenderness and hyperthermia in that area. Vascular surgery was consulted and she will be going to the OR for incision, drainage and a tunnel catheter placement today -Continue with antibiotics for now -Continue with supportive care  End-stage renal disease on hemodialysis [facility]) Nephrology was consulted.  No missed dialysis, on MWF routine.   Heterozygous factor V Leiden mutation (HCC) - We will holding off Xarelto and placing her on Lovenox for now pending potential surgical intervention by vascular surgery.  Type 2 diabetes mellitus with peripheral neuropathy (HCC) - The patient will be placed on supplemental coverage with NovoLog. - We will continue basal coverage. - We will continue Lyrica.  Dyslipidemia - We will continue statin therapy.       Subjective: Patient continued to have pain in left arm.  Awaiting for her procedure.  Physical Exam: Vitals:   03/14/23 1341 03/14/23 1346 03/14/23 1351 03/14/23 1400  BP: 119/69  (!) 143/90 (!) 141/55  Pulse: 82 (!) 0 81 80  Resp: 11  15 18   Temp:      TempSrc:      SpO2: 100%  100% 100%  Height:       General.  Obese lady, in no acute distress. Pulmonary.  Lungs clear bilaterally, normal respiratory effort. CV.  Regular rate and rhythm, no JVD, rub or murmur. Abdomen.  Soft, nontender, nondistended, BS positive. CNS.  Alert and oriented .  No focal neurologic deficit. Extremities.  No LE edema, no cyanosis, pulses intact and symmetrical.  L UE with significant edema and tenderness of brachiocephalic fistula area. Psychiatry.  Judgment and insight appears normal.   Data Reviewed: Prior data reviewed  Family Communication: No family at bedside  Disposition: Status  is: Inpatient Remains inpatient appropriate because: Severity of illness  Planned Discharge Destination: Home  DVT prophylaxis.  Subcu heparin Time spent: 45  minutes  This record has been created using Software engineer. Errors have been sought and corrected,but may not always be located. Such creation errors do not reflect on the standard of care.   Author: Arnetha Courser, MD 03/14/2023 2:07 PM  For on call review [email or web address].

## 2023-03-14 NOTE — Op Note (Signed)
OPERATIVE NOTE    PRE-OPERATIVE DIAGNOSIS: 1. ESRD 2. Infiltration of AVF  POST-OPERATIVE DIAGNOSIS: same as above  PROCEDURE: Ultrasound guidance for vascular access to the left internal jugular vein Fluoroscopic guidance for placement of catheter Placement of a 23 cm tip to cuff tunneled hemodialysis catheter via the left internal jugular vein  SURGEON: Festus Barren, MD  ANESTHESIA:  Local with Moderate conscious sedation for approximately 30 minutes using 1.5 mg of Versed and 75 mcg of Fentanyl  ESTIMATED BLOOD LOSS: 5 cc  FLUORO TIME: less than one minute  CONTRAST: none  FINDING(S): 1.  Patent left internal jugular vein  SPECIMEN(S):  None  INDICATIONS:   Jody Nelson is a 72 y.o. female who presents with renal failure and infiltration of her AVF.  The patient needs long term dialysis access for their ESRD, and a Permcath is necessary.  Risks and benefits are discussed and informed consent is obtained.    DESCRIPTION: After obtaining full informed written consent, the patient was brought back to the vascular suited. The patient's left neck and chest were sterilely prepped and draped in a sterile surgical field was created. Moderate conscious sedation was administered during a face to face encounter with the patient throughout the procedure with my supervision of the RN administering medicines and monitoring the patient's vital signs, pulse oximetry, telemetry and mental status throughout from the start of the procedure until the patient was taken to the recovery room.  The left internal jugular vein was visualized with ultrasound and found to be patent. It was then accessed under direct ultrasound guidance and a permanent image was recorded. A wire was placed. After skin nick and dilatation, the peel-away sheath was placed over the wire. I then turned my attention to an area under the clavicle. Approximately 1-2 fingerbreadths below the clavicle a small counterincision was created  and tunneled from the subclavicular incision to the access site. Using fluoroscopic guidance, a 23 centimeter tip to cuff tunneled hemodialysis catheter was selected, and tunneled from the subclavicular incision to the access site. It was then placed through the peel-away sheath and the peel-away sheath was removed. Using fluoroscopic guidance the catheter tips were parked in the right atrium. The appropriate distal connectors were placed. It withdrew blood well and flushed easily with heparinized saline and a concentrated heparin solution was then placed. It was secured to the chest wall with 2 Prolene sutures. The access incision was closed single 4-0 Monocryl. A 4-0 Monocryl pursestring suture was placed around the exit site. Sterile dressings were placed. The patient tolerated the procedure well and was taken to the recovery room in stable condition.  COMPLICATIONS: None  CONDITION: Stable  Festus Barren  03/14/2023, 1:42 PM   This note was created with Dragon Medical transcription system. Any errors in dictation are purely unintentional.

## 2023-03-14 NOTE — Progress Notes (Signed)
Pharmacy Antibiotic Note  Jody Nelson is a 72 y.o. female w/ ESRD on MWF HD admitted on 03/12/2023 with cellulitis.  Pharmacy has been consulted for Cefepime & Vancomycin dosing for 7 days.  Plan: Cefepime 1 gm q24hr per indication & ESRD on HD status.  Pt given Vancomycin 2500 mg once based on TBW > 100 kg. Variable Vancomycin dosing by pharmacy order placed. Plan to order Vancomycin 1g to be given after dialysis once plan has been finalized.  Pharmacy will continue to follow and will adjust abx dosing whenever warranted.  Temp (24hrs), Avg:98.1 F (36.7 C), Min:97.8 F (36.6 C), Max:98.6 F (37 C)   Recent Labs  Lab 03/12/23 2241 03/13/23 0454 03/14/23 1630  WBC 6.3 4.8 5.3  CREATININE 6.43* 6.93*  --     CrCl cannot be calculated (Unknown ideal weight.).    Allergies  Allergen Reactions   Sulfamethoxazole-Trimethoprim     Other reaction(s): Kidney Disorder Other reaction(s): Kidney Disorder Hyperkalemia, AKI Hyperkalemia, AKI     Antimicrobials this admission: 11/02 Zosyn >> x 1 dose 11/03 Cefepime >> x 7 days 11/03 Vancomycin >> x 7 days  Microbiology results: 11/02 BCx: NGTD 11/03 MRSA PCR: pending  Thank you for allowing pharmacy to be a part of this patient's care.  Bettey Costa, PharmD Clinical Pharmacist 03/14/2023 6:09 PM

## 2023-03-14 NOTE — Progress Notes (Signed)
[facility]  ROUNDING NOTE   Subjective:   Patient seen resting in bed Alert and oriented to self and place No family present Currently n.p.o. for vascular procedure Will plan for dialysis later today  Objective:  Vital signs in last 24 hours:  Temp:  [97.8 F (36.6 C)-98.6 F (37 C)] 97.8 F (36.6 C) (11/04 1507) Pulse Rate:  [0-91] 85 (11/04 1630) Resp:  [10-27] 16 (11/04 1630) BP: (98-187)/(44-175) 108/73 (11/04 1630) SpO2:  [98 %-100 %] 98 % (11/04 1630)  Weight change:  There were no vitals filed for this visit.  Intake/Output: I/O last 3 completed shifts: In: 400 [IV Piggyback:400] Out: -    Intake/Output this shift:  No intake/output data recorded.  Physical Exam: General: NAD  Head: Normocephalic, atraumatic. Moist oral mucosal membranes  Eyes: Anicteric,  Lungs:  Clear to auscultation, normal effort  Heart: Regular rate and rhythm  Abdomen:  Soft, nontender, obese  Extremities: 2+ peripheral edema.  Neurologic: Alert, moving all four extremities  Skin: No lesions  Access: left chest PermCath    Basic Metabolic Panel: Recent Labs  Lab 03/12/23 2241 03/13/23 0454  NA 136 135  K 4.6 4.0  CL 99 99  CO2 28 30  GLUCOSE 166* 186*  BUN 25* 30*  CREATININE 6.43* 6.93*  CALCIUM 8.6* 8.2*    Liver Function Tests: Recent Labs  Lab 03/12/23 2241  AST 16  ALT 11  ALKPHOS 53  BILITOT 1.1  PROT 7.1  ALBUMIN 3.4*   No results for input(s): "LIPASE", "AMYLASE" in the last 168 hours. No results for input(s): "AMMONIA" in the last 168 hours.  CBC: Recent Labs  Lab 03/12/23 2241 03/13/23 0454  WBC 6.3 4.8  NEUTROABS 3.5  --   HGB 9.3* 8.4*  HCT 30.1* 26.8*  MCV 100.0 99.6  PLT 116* 105*    Cardiac Enzymes: No results for input(s): "CKTOTAL", "CKMB", "CKMBINDEX", "TROPONINI" in the last 168 hours.  BNP: Invalid input(s): "POCBNP"  CBG: Recent Labs  Lab 03/14/23 0019 03/14/23 0453 03/14/23 0529 03/14/23 0721  03/14/23 1135  GLUCAP 131* 54* 92 109* 105*    Microbiology: Results for orders placed or performed during the hospital encounter of 03/12/23  Culture, blood (routine x 2)     Status: None (Preliminary result)   Collection Time: 03/12/23 11:21 PM   Specimen: BLOOD  Result Value Ref Range Status   Specimen Description BLOOD BLOOD RIGHT ARM  Final   Special Requests   Final    BOTTLES DRAWN AEROBIC AND ANAEROBIC Blood Culture results may not be optimal due to an inadequate volume of blood received in culture bottles   Culture   Final    NO GROWTH 2 DAYS Performed at [facility], [address] [place], [place] [address]    Report Status PENDING  Incomplete  Culture, blood (routine x 2)     Status: None (Preliminary result)   Collection Time: 03/13/23  4:54 AM   Specimen: BLOOD  Result Value Ref Range Status   Specimen Description BLOOD BLOOD RIGHT ARM  Final   Special Requests   Final    BOTTLES DRAWN AEROBIC AND ANAEROBIC Blood Culture results may not be optimal due to an inadequate volume of blood received in culture bottles   Culture   Final    NO GROWTH < 24 HOURS Performed at [facility], [address] [place], [place] [address]    Report Status PENDING  Incomplete    Coagulation  Studies: Recent Labs    03/13/23 0630  LABPROT 19.4*  INR 1.6*    Urinalysis: No results for input(s): "COLORURINE", "LABSPEC", "PHURINE", "GLUCOSEU", "HGBUR", "BILIRUBINUR", "KETONESUR", "PROTEINUR", "UROBILINOGEN", "NITRITE", "LEUKOCYTESUR" in the last 72 hours.  Invalid input(s): "APPERANCEUR"    Imaging: PERIPHERAL VASCULAR CATHETERIZATION  Result Date: 03/14/2023 See surgical note for result.  [place] Upper Ext Art Left Ltd  Result Date: 03/12/2023 CLINICAL DATA:  Left arm swelling and redness surrounding dialysis fistula EXAM: LEFT UPPER EXTREMITY ARTERIAL DUPLEX SCAN TECHNIQUE: Gray-scale sonography as well as color Doppler and duplex ultrasound was  performed to evaluate the dialysis fistula of the upper extremity. COMPARISON:  None Available. FINDINGS: Left upper extremity brachiocephalic dialysis graft is identified though is not optimally assessed on this examination. Selected images involving the proximal, mid, and distal portions of the fistula demonstrates patency with appropriate low resistance arterial waveforms, though these are not rigorously assessed on this exam. Flow volumes are not calculated on this examination. A small amount of a poorly loculated perigraft fluid is identified possibly representing perigraft edema or hemorrhage. Deep and medial to the dialysis fistula is a loculated complex fluid collection demonstrating internal debris measuring 3.6 x 2.5 x 2.6 cm demonstrating no internal vascularity possibly representing a a subcutaneous hematoma. Super infection is not excluded on this examination. IMPRESSION: 1. Left upper extremity brachiocephalic dialysis graft is identified though is not optimally assessed on this examination. Scattered images involving the proximal, mid, and distal portions of the fistula demonstrates patency with appropriate low resistance arterial waveforms, though these are not rigidly assessed on this exam. 2. Small amount of a poorly loculated perigraft fluid possibly representing perigraft edema or hemorrhage. 3. 3.6 x 2.5 x 2.6 cm loculated complex fluid collection demonstrating internal debris deep and medial to the dialysis fistula possibly representing a subcutaneous hematoma. Super infection is not excluded on this examination. Electronically Signed   By: Helyn Numbers M.D.   On: 03/12/2023 21:16     Medications:    [MAR Hold] ceFEPime (MAXIPIME) IV Stopped (03/14/23 1226)    [MAR Hold] atorvastatin  80 mg Oral QHS   [MAR Hold] calcitRIOL  0.25 mcg Oral Daily   [MAR Hold] calcium acetate  667 mg Oral 2 times per day on Monday Wednesday Friday   And   [MAR Hold] calcium acetate  667 mg Oral 3 times  per day on Sunday Tuesday Thursday Saturday   [facility] Hold] carvedilol  25 mg Oral BID WC   [START ON 03/15/2023] Chlorhexidine Gluconate Cloth  6 each Topical Q0600   [MAR Hold] docusate sodium  100 mg Oral BID   [MAR Hold] ferrous sulfate  325 mg Oral Q breakfast   [MAR Hold] fluticasone  1 spray Each Nare Daily   [MAR Hold] folic acid  1 mg Oral Daily   [MAR Hold] heparin  5,000 Units Subcutaneous Q8H   [MAR Hold] insulin aspart  0-15 Units Subcutaneous Q4H   [MAR Hold] insulin glargine-yfgn  10 Units Subcutaneous QHS   [MAR Hold] latanoprost  1 drop Both Eyes QHS   [MAR Hold] montelukast  10 mg Oral QHS   [MAR Hold] pantoprazole  40 mg Oral Daily   [MAR Hold] polyethylene glycol  17 g Oral Daily   [MAR Hold] pregabalin  25 mg Oral Daily   [MAR Hold] torsemide  10 mg Oral Daily   [MAR Hold] vancomycin variable dose per unstable renal function (pharmacist dosing)   Does not apply See admin instructions   [  MAR Hold] acetaminophen **OR** [MAR Hold] acetaminophen, [MAR Hold] albuterol, alteplase, heparin, [MAR Hold] HYDROcodone-acetaminophen, [MAR Hold] magnesium hydroxide, [MAR Hold]  morphine injection, [MAR Hold] ondansetron **OR** [MAR Hold] ondansetron (ZOFRAN) IV, [MAR Hold] traZODone  Assessment/ Plan:  [name] is a 72 y.o.  female with history of hypertension, coronary artery disease, congestive heart failure, diabetes, peripheral vascular disease, GERD, end-stage renal disease on dialysis.  Patient has been admitted for Abscess of upper arm [L02.419] ESRD on hemodialysis (HCC) [N18.6, Z99.2] Abscess of left upper extremity [L02.414]   End-stage renal disease on hemodialysis.  Due to an infected HD access, vascular to place PermCath.  Patient will receive dialysis later today.  2.  Infected AV graft with abscess.  Left upper extremity ultrasound shows presence of hematoma with possible hemorrhage.  Vascular surgery consulted to evaluate.  IV vancomycin and cefepime per primary  team.  3. Anemia of chronic kidney disease Lab Results  Component Value Date   HGB 9.5 (L) 03/14/2023    Hemoglobin within desired range.  Patient receives Mircera at outpatient clinic.  4. Secondary Hyperparathyroidism: with outpatient labs: None available Lab Results  Component Value Date   CALCIUM 8.2 (L) 03/13/2023   CAION 1.13 (L) 04/16/2022   PHOS 3.6 07/26/2022    Calcium within desired range.  Calcitriol, calcium acetate prescribed as outpatient.  5. Diabetes mellitus type II with chronic kidney disease/renal manifestations: insulin dependent. Home regimen includes Humalog and Lantus. Most recent hemoglobin A1c is 7.3 on 03/13/2023.      LOS: 2 Avishai Reihl 11/4/20244:46 PM

## 2023-03-15 ENCOUNTER — Encounter: Payer: Self-pay | Admitting: Vascular Surgery

## 2023-03-15 DIAGNOSIS — L02414 Cutaneous abscess of left upper limb: Secondary | ICD-10-CM | POA: Diagnosis not present

## 2023-03-15 DIAGNOSIS — E1142 Type 2 diabetes mellitus with diabetic polyneuropathy: Secondary | ICD-10-CM | POA: Diagnosis not present

## 2023-03-15 DIAGNOSIS — N186 End stage renal disease: Secondary | ICD-10-CM | POA: Diagnosis not present

## 2023-03-15 DIAGNOSIS — D6851 Activated protein C resistance: Secondary | ICD-10-CM | POA: Diagnosis not present

## 2023-03-15 LAB — CBG MONITORING, ED
Glucose-Capillary: 113 mg/dL — ABNORMAL HIGH (ref 70–99)
Glucose-Capillary: 119 mg/dL — ABNORMAL HIGH (ref 70–99)

## 2023-03-15 MED ORDER — PATADAY 0.7 % OP SOLN: 1.0000 [drp] | Freq: Every day | OPHTHALMIC | 0 refills | Status: AC

## 2023-03-15 MED ORDER — DIPHENHYDRAMINE HCL 25 MG PO CAPS
25.0000 mg | ORAL_CAPSULE | Freq: Once | ORAL | Status: AC
  Administered 2023-03-15: 25 mg via ORAL
  Filled 2023-03-15: qty 1

## 2023-03-15 NOTE — ED Notes (Signed)
Dr. Nelson Chimes at bedside, she states patient will be discharged from ED.

## 2023-03-15 NOTE — Progress Notes (Signed)
[facility]  ROUNDING NOTE   Subjective:   Patient seen sitting up in bed Partially completed breakfast tray at bedside States she feels well Denies pain from permcath site  Objective:  Vital signs in last 24 hours:  Temp:  [97.8 F (36.6 C)-98.9 F (37.2 C)] 98.7 F (37.1 C) (11/05 0743) Pulse Rate:  [85-97] 92 (11/05 0743) Resp:  [12-19] 15 (11/05 0743) BP: (86-132)/(42-82) 108/42 (11/05 0743) SpO2:  [98 %-100 %] 100 % (11/05 0743) Weight:  [191 kg] 137 kg (11/04 1907)  Weight change:  Filed Weights   03/14/23 1907  Weight: (!) 137 kg    Intake/Output: I/O last 3 completed shifts: In: -  Out: 2500 [Other:2500]   Intake/Output this shift:  No intake/output data recorded.  Physical Exam: General: NAD  Head: Normocephalic, atraumatic. Moist oral mucosal membranes  Eyes: Anicteric,  Lungs:  Clear to auscultation, normal effort  Heart: Regular rate and rhythm  Abdomen:  Soft, nontender, obese  Extremities: 1+ peripheral edema.  Neurologic: Alert, moving all four extremities  Skin: No lesions  Access: left chest PermCath    Basic Metabolic Panel: Recent Labs  Lab 03/12/23 2241 03/13/23 0454  NA 136 135  K 4.6 4.0  CL 99 99  CO2 28 30  GLUCOSE 166* 186*  BUN 25* 30*  CREATININE 6.43* 6.93*  CALCIUM 8.6* 8.2*    Liver Function Tests: Recent Labs  Lab 03/12/23 2241  AST 16  ALT 11  ALKPHOS 53  BILITOT 1.1  PROT 7.1  ALBUMIN 3.4*   No results for input(s): "LIPASE", "AMYLASE" in the last 168 hours. No results for input(s): "AMMONIA" in the last 168 hours.  CBC: Recent Labs  Lab 03/12/23 2241 03/13/23 0454 03/14/23 1630  WBC 6.3 4.8 5.3  NEUTROABS 3.5  --   --   HGB 9.3* 8.4* 9.5*  HCT 30.1* 26.8* 30.6*  MCV 100.0 99.6 99.0  PLT 116* 105* 128*    Cardiac Enzymes: No results for input(s): "CKTOTAL", "CKMB", "CKMBINDEX", "TROPONINI" in the last 168 hours.  BNP: Invalid input(s): "POCBNP"  CBG: Recent Labs  Lab  03/14/23 1135 03/14/23 2152 03/14/23 2327 03/15/23 0333 03/15/23 0742  GLUCAP 105* 109* 143* 113* 119*    Microbiology: Results for orders placed or performed during the hospital encounter of 03/12/23  Culture, blood (routine x 2)     Status: None (Preliminary result)   Collection Time: 03/12/23 11:21 PM   Specimen: BLOOD  Result Value Ref Range Status   Specimen Description BLOOD BLOOD RIGHT ARM  Final   Special Requests   Final    BOTTLES DRAWN AEROBIC AND ANAEROBIC Blood Culture results may not be optimal due to an inadequate volume of blood received in culture bottles   Culture   Final    NO GROWTH 3 DAYS Performed at [facility], [address] [place], [place] [address]    Report Status PENDING  Incomplete  Culture, blood (routine x 2)     Status: None (Preliminary result)   Collection Time: 03/13/23  4:54 AM   Specimen: BLOOD  Result Value Ref Range Status   Specimen Description BLOOD BLOOD RIGHT ARM  Final   Special Requests   Final    BOTTLES DRAWN AEROBIC AND ANAEROBIC Blood Culture results may not be optimal due to an inadequate volume of blood received in culture bottles   Culture   Final    NO GROWTH 2 DAYS Performed at [facility], [address] [place], [place] [address]    Report Status PENDING  Incomplete    Coagulation Studies: Recent Labs    03/13/23 0630  LABPROT 19.4*  INR 1.6*    Urinalysis: No results for input(s): "COLORURINE", "LABSPEC", "PHURINE", "GLUCOSEU", "HGBUR", "BILIRUBINUR", "KETONESUR", "PROTEINUR", "UROBILINOGEN", "NITRITE", "LEUKOCYTESUR" in the last 72 hours.  Invalid input(s): "APPERANCEUR"    Imaging: PERIPHERAL VASCULAR CATHETERIZATION  Result Date: 03/14/2023 See surgical note for result.    Medications:      atorvastatin  80 mg Oral QHS   calcitRIOL  0.25 mcg Oral Daily   calcium acetate  667 mg Oral 2 times per day on Monday Wednesday Friday   And   calcium acetate  667 mg Oral 3  times per day on Sunday Tuesday Thursday Saturday   carvedilol  25 mg Oral BID WC   Chlorhexidine Gluconate Cloth  6 each Topical Q0600   docusate sodium  100 mg Oral BID   ferrous sulfate  325 mg Oral Q breakfast   fluticasone  1 spray Each Nare Daily   folic acid  1 mg Oral Daily   heparin  5,000 Units Subcutaneous Q8H   insulin aspart  0-15 Units Subcutaneous Q4H   insulin glargine-yfgn  10 Units Subcutaneous QHS   latanoprost  1 drop Both Eyes QHS   montelukast  10 mg Oral QHS   pantoprazole  40 mg Oral Daily   polyethylene glycol  17 g Oral Daily   pregabalin  25 mg Oral Daily   torsemide  10 mg Oral Daily   acetaminophen **OR** acetaminophen, albuterol, alteplase, heparin, HYDROcodone-acetaminophen, magnesium hydroxide, morphine injection, ondansetron **OR** ondansetron (ZOFRAN) IV, traZODone  Assessment/ Plan:  [name] is a 72 y.o.  female with history of hypertension, coronary artery disease, congestive heart failure, diabetes, peripheral vascular disease, GERD, end-stage renal disease on dialysis.  Patient has been admitted for Abscess of upper arm [L02.419] ESRD on hemodialysis (HCC) [N18.6, Z99.2] Abscess of left upper extremity [L02.414]   End-stage renal disease on hemodialysis.  Dialysis received yesterday, UF 2.5L achieved. Next treatment scheduled for Wednesday.  2.  Infected AV graft with abscess.  Left upper extremity ultrasound shows presence of hematoma with possible hemorrhage.  Vascular surgery consulted and believe hematoma will reabsorb.  IV vancomycin per primary team.  3. Anemia of chronic kidney disease Lab Results  Component Value Date   HGB 9.5 (L) 03/14/2023    Hemoglobin at goal.  Patient receives Mircera at outpatient clinic.  4. Secondary Hyperparathyroidism: with outpatient labs: None available Lab Results  Component Value Date   CALCIUM 8.2 (L) 03/13/2023   CAION 1.13 (L) 04/16/2022   PHOS 3.6 07/26/2022    Calcitriol, calcium  acetate prescribed as outpatient. Will monitor bone minerals.  5. Diabetes mellitus type II with chronic kidney disease/renal manifestations: insulin dependent. Home regimen includes Humalog and Lantus. Most recent hemoglobin A1c is 7.3 on 03/13/2023.  Marland Kitchen Glucose well controlled     LOS: 2 Marchello Rothgeb 11/5/20243:36 PM

## 2023-03-15 NOTE — ED Notes (Signed)
Stanton Kidney from [facility] called and stated transportation will arrive with patient's powerchair in an hour.

## 2023-03-15 NOTE — Discharge Summary (Addendum)
Physician Discharge Summary   Patient: Jody Nelson MRN: [id number] DOB: 11-04-1950  Admit date:     03/12/2023  Discharge date: 03/15/23  Discharge Physician: Arnetha Courser   PCP: Patient, No Pcp Per   Recommendations at discharge:  Please obtain CBC and BMP on follow-up Follow-up with vascular surgery in 2 weeks Continue outpatient dialysis Follow-up with primary care provider  Discharge Diagnoses: Principal Problem:   Abscess of left upper extremity Active Problems:   End-stage renal disease on hemodialysis (HCC)   Heterozygous factor V Leiden mutation (HCC)   Type 2 diabetes mellitus with peripheral neuropathy (HCC)   Dyslipidemia   Hospital Course: Taken from H&P.  Jody Nelson is a 72 y.o. African-American female with medical history significant for HFpEF, end-stage renal disease on HD on MWF, GERD, dyslipidemia, and hypertension as well as type 2 diabetes mellitus, who presented to the emergency room with acute onset of left upper extremity pain with swelling, warmth erythema and tenderness since Thursday.  She denied any fever or chills.  Concern of infiltrated left brachiocephalic fistula with hematoma.  On presentation vitals and labs stable.   Upper extremity ultrasound revealed the following: 1. Left upper extremity brachiocephalic dialysis graft is identified though is not optimally assessed on this examination. Scattered images involving the proximal, mid, and distal portions of the fistula demonstrates patency with appropriate low resistance arterial waveforms, though these are not rigidly assessed on this exam. 2. Small amount of a poorly loculated perigraft fluid possibly representing perigraft edema or hemorrhage. 3. 3.6 x 2.5 x 2.6 cm loculated complex fluid collection demonstrating internal debris deep and medial to the dialysis fistula possibly representing a subcutaneous hematoma. Super infection is not excluded on this examination.  Vascular surgery  was consulted.  They will do incision and drainage and also placement of a tunneled dialysis catheter on Monday in OR.  Patient was started on broad-spectrum antibiotics.  11/4: Vitals and labs seems stable.  Preliminary blood cultures negative in 12 hours.  Going for incision and drainage and a new tunneled catheter placement by vascular surgery today.  11/5: Vital stable patient had a new tunneled catheter placed by vascular surgery yesterday.  They did not did incision and drainage stating that it is likely an hematoma and will resolve on its own in the few upcoming weeks. Patient had her routine dialysis yesterday and tolerated well.  Discussed with nephrology and decided not to continue antibiotics at this time.  They will monitor as outpatient.  Patient will follow-up with vascular surgery in next couple of weeks and they will decide about another procedure if needed.  Patient will continue on current medications and follow-up with her providers for further management.  Assessment and Plan: * Abscess of left upper extremity Likely secondary to infiltrated left brachiocephalic fistula and hematoma.  Concern of some superadded infection due to tenderness and hyperthermia in that area. Vascular surgery was consulted and a new tunneled catheter was placed on left chest.  Vascular surgery did not did any incision and drainage stating that it is likely an hematoma and should resolve on its own.  No concern of infection at this time so antibiotics were discontinued.  End-stage renal disease on hemodialysis [facility]) Nephrology was consulted.  No missed dialysis, on MWF routine.  Did receive routine dialysis on Monday during current hospitalization  Heterozygous factor V Leiden mutation (HCC) - We will holding off Xarelto and placing her on Lovenox for now pending potential surgical intervention by vascular surgery.  Type 2 diabetes mellitus with peripheral neuropathy (HCC) - The patient will be  placed on supplemental coverage with NovoLog. - We will continue basal coverage. - We will continue Lyrica.  Dyslipidemia - We will continue statin therapy.       Consultants: Nephrology.  Vascular surgery Procedures performed: Permanent HD catheter placement Disposition: Home Diet recommendation:  Discharge Diet Orders (From admission, onward)     Start     Ordered   03/15/23 0000  Diet - low sodium heart healthy        03/15/23 0954           Renal diet DISCHARGE MEDICATION: Allergies as of 03/15/2023       Reactions   Sulfamethoxazole-trimethoprim    Other reaction(s): Kidney Disorder Other reaction(s): Kidney Disorder Hyperkalemia, AKI Hyperkalemia, AKI        Medication List     STOP taking these medications    ACIDOPHILUS LACTOBACILLUS PO   Fluticasone Furoate 50 MCG/ACT Aepb   gabapentin 100 MG capsule Commonly known as: NEURONTIN   gabapentin 300 MG capsule Commonly known as: NEURONTIN   metroNIDAZOLE 1 % gel Commonly known as: METROGEL       TAKE these medications    acetaminophen 325 MG tablet Commonly known as: TYLENOL Take 2 tablets by mouth every 8 (eight) hours as needed.   albuterol 108 (90 Base) MCG/ACT inhaler Commonly known as: VENTOLIN HFA Inhale 1-2 puffs into the lungs every 4 (four) hours as needed for wheezing or shortness of breath.   albuterol (2.5 MG/3ML) 0.083% nebulizer solution Commonly known as: PROVENTIL Take 2.5 mg by nebulization every 6 (six) hours as needed for wheezing or shortness of breath.   apixaban 5 MG Tabs tablet Commonly known as: ELIQUIS Take 1 tablet (5 mg total) by mouth 2 (two) times daily.   aspirin 81 MG chewable tablet Chew 81 mg by mouth daily.   atorvastatin 80 MG tablet Commonly known as: LIPITOR Take 80 mg by mouth daily.   calcitRIOL 0.25 MCG capsule Commonly known as: ROCALTROL Take 0.25 mcg by mouth daily.   calcium acetate 667 MG capsule Commonly known as: PHOSLO Take  one capsule by mouth twice daily with food on Mondays, Wednesdays, and Fridays. Take one capsule 3 times daily on Tuesdays, Thursdays, Saturdays, and Sundays.   carvedilol 25 MG tablet Commonly known as: COREG Take 1 tablet by mouth 2 (two) times daily with a meal.   diclofenac Sodium 1 % Gel Commonly known as: VOLTAREN Apply 2 g topically every 4 (four) hours as needed (pain). Apply bilateral hips   docusate sodium 100 MG capsule Commonly known as: COLACE Take 100 mg by mouth 2 (two) times daily.   ferrous sulfate 324 (65 Fe) MG Tbec Take 1 tablet by mouth daily.   fluticasone 50 MCG/ACT nasal spray Commonly known as: FLONASE Place 1 spray into both nostrils daily.   folic acid 1 MG tablet Commonly known as: FOLVITE Take 1 mg by mouth daily.   Healthy Eyes Supervision 2 Caps Take 1 capsule by mouth 2 (two) times daily. What changed: Another medication with the same name was removed. Continue taking this medication, and follow the directions you see here.   HYDROcodone-acetaminophen 5-325 MG tablet Commonly known as: NORCO/VICODIN Take 1 tablet by mouth every 6 (six) hours as needed for moderate pain.   insulin glargine 100 UNIT/ML Solostar Pen Commonly known as: LANTUS Inject 10 Units into the skin at bedtime.   insulin lispro 100  UNIT/ML injection Commonly known as: HUMALOG Inject 2-12 Units into the skin in the morning, at noon, and at bedtime. Per sliding scale   latanoprost 0.005 % ophthalmic solution Commonly known as: XALATAN Place 1 drop into both eyes at bedtime.   lidocaine-prilocaine cream Commonly known as: EMLA Apply 1 application  topically 3 (three) times a week. On mon, wed, and friday   loratadine 10 MG tablet Commonly known as: CLARITIN Take 10 mg by mouth daily.   magnesium hydroxide 400 MG/5ML suspension Commonly known as: MILK OF MAGNESIA Take 30 mLs by mouth daily as needed for mild constipation.   montelukast 10 MG tablet Commonly known  as: SINGULAIR Take 10 mg by mouth daily.   omeprazole 10 MG capsule Commonly known as: PRILOSEC Take 10 mg by mouth daily.   Oyster Shell Calcium w/D 500-5 MG-MCG Tabs Take 1 tablet by mouth every Tuesday, Thursday, Saturday, and Sunday.   Ozempic (1 MG/DOSE) 4 MG/3ML Sopn Generic drug: Semaglutide (1 MG/DOSE) Inject 1 mg into the skin every Friday.   Pataday 0.7 % Soln Generic drug: Olopatadine HCl Place 1 drop into both eyes daily.   polyethylene glycol 17 g packet Commonly known as: MIRALAX / GLYCOLAX Take 17 g by mouth daily.   pregabalin 25 MG capsule Commonly known as: LYRICA Take 25 mg by mouth daily.   torsemide 10 MG tablet Commonly known as: DEMADEX Take 10 mg by mouth daily.               Discharge Care Instructions  (From admission, onward)           Start     Ordered   03/15/23 0000  No dressing needed        11 /05/24 5409            Follow-up Information     Dew, Marlow Baars, MD. Schedule an appointment as soon as possible for a visit in 2 week(s).   Specialties: Vascular Surgery, Radiology, Interventional Cardiology Contact information: [address] [place] [address] [phone]                Discharge Exam: Ceasar Mons Weights   03/14/23 1907  Weight: (!) 137 kg   General.  Morbidly obese lady, in no acute distress. Pulmonary.  Lungs clear bilaterally, normal respiratory effort. CV.  Regular rate and rhythm, no JVD, rub or murmur. Abdomen.  Soft, nontender, nondistended, BS positive. CNS.  Alert and oriented .  No focal neurologic deficit. Extremities.  No edema,  pulses intact and symmetrical.  Left upper extremity edema with mild tenderness today at brachiocephalic fistula site Psychiatry.  Judgment and insight appears normal.   Condition at discharge: stable  The results of significant diagnostics from this hospitalization (including imaging, microbiology, ancillary and laboratory) are listed below  for reference.   Imaging Studies: PERIPHERAL VASCULAR CATHETERIZATION  Result Date: 03/14/2023 See surgical note for result.  [place] Upper Ext Art Left Ltd  Result Date: 03/12/2023 CLINICAL DATA:  Left arm swelling and redness surrounding dialysis fistula EXAM: LEFT UPPER EXTREMITY ARTERIAL DUPLEX SCAN TECHNIQUE: Gray-scale sonography as well as color Doppler and duplex ultrasound was performed to evaluate the dialysis fistula of the upper extremity. COMPARISON:  None Available. FINDINGS: Left upper extremity brachiocephalic dialysis graft is identified though is not optimally assessed on this examination. Selected images involving the proximal, mid, and distal portions of the fistula demonstrates patency with appropriate low resistance arterial waveforms, though these are not rigorously  assessed on this exam. Flow volumes are not calculated on this examination. A small amount of a poorly loculated perigraft fluid is identified possibly representing perigraft edema or hemorrhage. Deep and medial to the dialysis fistula is a loculated complex fluid collection demonstrating internal debris measuring 3.6 x 2.5 x 2.6 cm demonstrating no internal vascularity possibly representing a a subcutaneous hematoma. Super infection is not excluded on this examination. IMPRESSION: 1. Left upper extremity brachiocephalic dialysis graft is identified though is not optimally assessed on this examination. Scattered images involving the proximal, mid, and distal portions of the fistula demonstrates patency with appropriate low resistance arterial waveforms, though these are not rigidly assessed on this exam. 2. Small amount of a poorly loculated perigraft fluid possibly representing perigraft edema or hemorrhage. 3. 3.6 x 2.5 x 2.6 cm loculated complex fluid collection demonstrating internal debris deep and medial to the dialysis fistula possibly representing a subcutaneous hematoma. Super infection is not excluded on this  examination. Electronically Signed   By: Helyn Numbers M.D.   On: 03/12/2023 21:16    Microbiology: Results for orders placed or performed during the hospital encounter of 03/12/23  Culture, blood (routine x 2)     Status: None (Preliminary result)   Collection Time: 03/12/23 11:21 PM   Specimen: BLOOD  Result Value Ref Range Status   Specimen Description BLOOD BLOOD RIGHT ARM  Final   Special Requests   Final    BOTTLES DRAWN AEROBIC AND ANAEROBIC Blood Culture results may not be optimal due to an inadequate volume of blood received in culture bottles   Culture   Final    NO GROWTH 3 DAYS Performed at [facility], [address] [place], [place] [address]    Report Status PENDING  Incomplete  Culture, blood (routine x 2)     Status: None (Preliminary result)   Collection Time: 03/13/23  4:54 AM   Specimen: BLOOD  Result Value Ref Range Status   Specimen Description BLOOD BLOOD RIGHT ARM  Final   Special Requests   Final    BOTTLES DRAWN AEROBIC AND ANAEROBIC Blood Culture results may not be optimal due to an inadequate volume of blood received in culture bottles   Culture   Final    NO GROWTH 2 DAYS Performed at [facility], [address] [place], [place] [address]    Report Status PENDING  Incomplete    Labs: CBC: Recent Labs  Lab 03/12/23 2241 03/13/23 0454 03/14/23 1630  WBC 6.3 4.8 5.3  NEUTROABS 3.5  --   --   HGB 9.3* 8.4* 9.5*  HCT 30.1* 26.8* 30.6*  MCV 100.0 99.6 99.0  PLT 116* 105* 128*   Basic Metabolic Panel: Recent Labs  Lab 03/12/23 2241 03/13/23 0454  NA 136 135  K 4.6 4.0  CL 99 99  CO2 28 30  GLUCOSE 166* 186*  BUN 25* 30*  CREATININE 6.43* 6.93*  CALCIUM 8.6* 8.2*   Liver Function Tests: Recent Labs  Lab 03/12/23 2241  AST 16  ALT 11  ALKPHOS 53  BILITOT 1.1  PROT 7.1  ALBUMIN 3.4*   CBG: Recent Labs  Lab 03/14/23 1135 03/14/23 2152 03/14/23 2327 03/15/23 0333 03/15/23 0742  GLUCAP 105* 109* 143*  113* 119*    Discharge time spent: greater than 30 minutes.  This record has been created using Conservation officer, historic buildings. Errors have been sought and corrected,but may not always be located. Such creation errors do not reflect on the  standard of care.   Signed: Arnetha Courser, MD [facility] 03/15/2023

## 2023-03-15 NOTE — ED Notes (Signed)
Called [facility] for transportation, will call back with an ETA.

## 2023-03-15 NOTE — ED Notes (Signed)
Spoke to daughter Marylene Land) and provided update on patient. Marylene Land requesting to speak with patient's doctor, Dr. Nelson Chimes was notified and given Angela's information.

## 2023-03-17 LAB — CULTURE, BLOOD (ROUTINE X 2): Culture: NO GROWTH

## 2023-03-18 LAB — CULTURE, BLOOD (ROUTINE X 2): Culture: NO GROWTH

## 2023-03-28 ENCOUNTER — Encounter (INDEPENDENT_AMBULATORY_CARE_PROVIDER_SITE_OTHER): Payer: Self-pay | Admitting: Vascular Surgery

## 2023-03-28 ENCOUNTER — Ambulatory Visit (INDEPENDENT_AMBULATORY_CARE_PROVIDER_SITE_OTHER): Payer: 59 | Admitting: Vascular Surgery

## 2023-03-28 VITALS — BP 102/69 | HR 88 | Resp 16 | Wt 299.0 lb

## 2023-03-28 DIAGNOSIS — I10 Essential (primary) hypertension: Secondary | ICD-10-CM | POA: Diagnosis not present

## 2023-03-28 DIAGNOSIS — N186 End stage renal disease: Secondary | ICD-10-CM | POA: Diagnosis not present

## 2023-03-28 DIAGNOSIS — E785 Hyperlipidemia, unspecified: Secondary | ICD-10-CM

## 2023-03-28 DIAGNOSIS — E119 Type 2 diabetes mellitus without complications: Secondary | ICD-10-CM

## 2023-03-28 DIAGNOSIS — R760 Raised antibody titer: Secondary | ICD-10-CM

## 2023-03-28 DIAGNOSIS — Z992 Dependence on renal dialysis: Secondary | ICD-10-CM

## 2023-03-29 ENCOUNTER — Encounter (INDEPENDENT_AMBULATORY_CARE_PROVIDER_SITE_OTHER): Payer: Self-pay | Admitting: Vascular Surgery

## 2023-03-29 NOTE — Progress Notes (Signed)
MRN : [id number]  Jody Nelson is a 72 y.o. (August 15, 1950) female who presents with chief complaint of check access.  History of Present Illness:   The patient returns to the office for follow up regarding a problem with their dialysis access.   The patient notes a significant increase in problems with dialysis.  It is reported that adequate dialysis is not being achieved.    The patient denies hand pain or other symptoms consistent with steal phenomena.  No significant arm swelling.  The patient denies redness or swelling at the access site. The patient denies fever or chills at home or while on dialysis.  No recent shortening of the patient's walking distance or new symptoms consistent with claudication.  No history of rest pain symptoms. No new ulcers or wounds of the lower extremities have occurred.  The patient denies amaurosis fugax or recent TIA symptoms. There are no recent neurological changes noted. There is no history of DVT, PE or superficial thrombophlebitis. No recent episodes of angina or shortness of breath documented.    Current Meds  Medication Sig   acetaminophen (TYLENOL) 325 MG tablet Take 2 tablets by mouth every 8 (eight) hours as needed.   albuterol (PROVENTIL) (2.5 MG/3ML) 0.083% nebulizer solution Take 2.5 mg by nebulization every 6 (six) hours as needed for wheezing or shortness of breath.   albuterol (VENTOLIN HFA) 108 (90 Base) MCG/ACT inhaler Inhale 1-2 puffs into the lungs every 4 (four) hours as needed for wheezing or shortness of breath.   apixaban (ELIQUIS) 5 MG TABS tablet Take 1 tablet (5 mg total) by mouth 2 (two) times daily.   aspirin 81 MG chewable tablet Chew 81 mg by mouth daily.   atorvastatin (LIPITOR) 80 MG tablet Take 80 mg by mouth daily.   calcitRIOL (ROCALTROL) 0.25 MCG capsule Take 0.25 mcg by mouth daily.   calcium acetate (PHOSLO) 667 MG capsule Take one capsule by mouth twice daily with food on Mondays,  Wednesdays, and Fridays. Take one capsule 3 times daily on Tuesdays, Thursdays, Saturdays, and Sundays.   Calcium Carb-Cholecalciferol (OYSTER SHELL CALCIUM W/D) 500-5 MG-MCG TABS Take 1 tablet by mouth every Tuesday, Thursday, Saturday, and Sunday.   carvedilol (COREG) 25 MG tablet Take 1 tablet by mouth 2 (two) times daily with a meal.   diclofenac Sodium (VOLTAREN) 1 % GEL Apply 2 g topically every 4 (four) hours as needed (pain). Apply bilateral hips   docusate sodium (COLACE) 100 MG capsule Take 100 mg by mouth 2 (two) times daily.   ferrous sulfate 324 (65 Fe) MG TBEC Take 1 tablet by mouth daily.   fluticasone (FLONASE) 50 MCG/ACT nasal spray Place 1 spray into both nostrils daily.   folic acid (FOLVITE) 1 MG tablet Take 1 mg by mouth daily.   HYDROcodone-acetaminophen (NORCO/VICODIN) 5-325 MG tablet Take 1 tablet by mouth every 6 (six) hours as needed for moderate pain.   insulin glargine (LANTUS) 100 UNIT/ML Solostar Pen Inject 10 Units into the skin at bedtime.   insulin lispro (HUMALOG) 100 UNIT/ML injection Inject 2-12 Units into the skin in the morning, at noon, and at bedtime. Per sliding scale   latanoprost (XALATAN) 0.005 % ophthalmic solution Place 1 drop into both eyes at bedtime.   lidocaine-prilocaine (EMLA) cream Apply 1 application  topically 3 (three) times a week. On mon, wed, and friday   loratadine (CLARITIN)  10 MG tablet Take 10 mg by mouth daily.   magnesium hydroxide (MILK OF MAGNESIA) 400 MG/5ML suspension Take 30 mLs by mouth daily as needed for mild constipation.   montelukast (SINGULAIR) 10 MG tablet Take 10 mg by mouth daily.   Multiple Vitamins-Minerals (HEALTHY EYES SUPERVISION 2) CAPS Take 1 capsule by mouth 2 (two) times daily.   Olopatadine HCl (PATADAY) 0.7 % SOLN Place 1 drop into both eyes daily.   omeprazole (PRILOSEC) 10 MG capsule Take 10 mg by mouth daily.   OZEMPIC, 1 MG/DOSE, 4 MG/3ML SOPN Inject 1 mg into the skin every Friday.   polyethylene  glycol (MIRALAX / GLYCOLAX) 17 g packet Take 17 g by mouth daily.   pregabalin (LYRICA) 25 MG capsule Take 25 mg by mouth daily.   torsemide (DEMADEX) 10 MG tablet Take 10 mg by mouth daily.    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   At high risk for falls    Atypical chest pain    Bilateral lower extremity edema    Chronic right sided weakness    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   a.) MRI brain 02/27/2011: old lacunar infarct in RIGHT pons and medulla oblongata   Cryptogenic stroke (HCC) 10/11/2012   a.) CT brain 10/11/2012: tiny old lacunar infarct in head of caudate nucleus (left)   Cryptogenic stroke (HCC) 10/12/2012   a.) MRI brain 10/12/2012: multiple acute LEFT hemispheric infarcts   Cryptogenic stroke (HCC) 06/26/2014   a.) MRI brain 06/26/2014: acute RIGHT posterior frontal lobe infarct involving both and grey matter   Cryptogenic stroke (HCC) 07/29/2015   a.) MRI brain 07/29/2015: 2 foci of diffusion restriction in the LEFT parietal lobe consistent with acute infarction   Cryptogenic stroke (HCC) 07/24/2017   a.) MRI brain 07/24/2017: punctate focus of acute ischemia in the medial LEFT temporal lobe   DDD (degenerative disc disease), cervical    Depression    DOE (dyspnea on exertion)    Dysarthria as late effect of stroke    ESRD (end stage renal disease) on dialysis (HCC)    a.) M-W-F   GERD (gastroesophageal reflux disease)    Hallux valgus, bilateral    Heterozygous factor V Leiden mutation (HCC) 07/18/2018   History of  cardiac catheterization 08/30/2008   a.) LHC 08/30/2008: EF >60%, LVEDP 18 mmHg, normal coronaries.   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   a.) 07/18/2018 --> 22 mol/L   Hypertension    Insomnia    a.) melatonin + trazodone PRN   Long term current use of anticoagulant    a.) apixaban   Lymphedema of both lower extremities    Meralgia paresthetica, left lower limb    OSA (obstructive sleep apnea)    a.) does not require nocturnal PAP therapy   Osteoarthritis    Pneumonia  Pseudophakia of both eyes    Sarcoidosis    Secondary hyperparathyroidism of renal origin (HCC)    Type 2 diabetes mellitus treated with insulin (HCC)    Vitamin D deficiency     Past Surgical History:  Procedure Laterality Date   A/V FISTULAGRAM Left 12/28/2022   Procedure: A/V Fistulagram;  Surgeon: Renford Dills, MD;  Location: [facility];  Service: Cardiovascular;  Laterality: Left;   APPLICATION OF WOUND VAC Right 06/15/2022   Procedure: APPLICATION OF WOUND VAC;  Surgeon: Signa Kell, MD;  Location: [facility];  Service: Orthopedics;  Laterality: Right;   AV FISTULA PLACEMENT Left 04/16/2022   Procedure: INSERTION OF ARTERIOVENOUS (AV) GORE-TEX GRAFT ARM (BRACHIAL AXILLARY);  Surgeon: Renford Dills, MD;  Location: [facility];  Service: Vascular;  Laterality: Left;   CESAREAN SECTION     DIALYSIS/PERMA CATHETER INSERTION N/A 10/14/2021   Procedure: DIALYSIS/PERMA CATHETER INSERTION;  Surgeon: Annice Needy, MD;  Location: [facility];  Service: Cardiovascular;  Laterality: N/A;   DIALYSIS/PERMA CATHETER INSERTION N/A 03/14/2023   Procedure: DIALYSIS/PERMA CATHETER INSERTION;  Surgeon: Annice Needy, MD;  Location: [facility];  Service: Cardiovascular;  Laterality: N/A;   DIALYSIS/PERMA CATHETER REMOVAL N/A 08/02/2022   Procedure: DIALYSIS/PERMA CATHETER REMOVAL;  Surgeon: Annice Needy, MD;  Location: [facility];  Service: Cardiovascular;  Laterality: N/A;   FRACTURE SURGERY     leg right fracture several surgeries, rods   INCISION AND DRAINAGE OF WOUND Right 06/15/2022   Procedure: IRRIGATION AND DEBRIDEMENT WOUND;  Surgeon: Signa Kell, MD;  Location: [facility];  Service: Orthopedics;  Laterality: Right;    Social History Social History   Tobacco Use   Smoking status: Former    Types: Cigarettes   Smokeless tobacco: Never  Substance Use Topics   Alcohol use: Never   Drug use: Never    Family History Family History  Problem Relation Age of Onset   Diabetes Mother    Colon cancer Sister    Diabetes Sister    Cancer Maternal Uncle    Clotting disorder Paternal Grandmother    Colon cancer Paternal Grandfather     Allergies  Allergen Reactions   Sulfamethoxazole-Trimethoprim     Other reaction(s): Kidney Disorder Other reaction(s): Kidney Disorder Hyperkalemia, AKI Hyperkalemia, AKI      REVIEW OF SYSTEMS (Negative unless checked)  Constitutional: [] Weight loss  [] Fever  [] Chills Cardiac: [] Chest pain   [] Chest pressure   [] Palpitations   [] Shortness of breath when laying flat   [] Shortness of breath with exertion. Vascular:  [] Pain in legs with walking   [] Pain in legs at rest  [] History of DVT   [] Phlebitis   [] Swelling in legs   [] Varicose veins   [] Non-healing ulcers Pulmonary:   [] Uses home oxygen   [] Productive cough   [] Hemoptysis   [] Wheeze  [] COPD   [] Asthma Neurologic:  [] Dizziness   [] Seizures   [] History of stroke   [] History of TIA  [] Aphasia   [] Vissual changes   [] Weakness or numbness in arm   [] Weakness or numbness in leg Musculoskeletal:   [] Joint swelling   [] Joint pain   [] Low back pain Hematologic:  [] Easy bruising  [] Easy bleeding   [] Hypercoagulable state    [] Anemic Gastrointestinal:  [] Diarrhea   [] Vomiting  [] Gastroesophageal reflux/heartburn   [] Difficulty swallowing. Genitourinary:  [x] Chronic kidney disease   [] Difficult urination  [] Frequent urination   [] Blood in urine Skin:  [] Rashes   [] Ulcers  Psychological:  [] History of anxiety   []  History of major depression.  Physical Examination  Vitals:   03/28/23 1603  BP: 102/69  Pulse: 88  Resp: 16  Weight: 299 lb (135.6 kg)   Body mass index is 48.26 kg/m. Gen: WD/WN, NAD Head: [place]/AT, No temporalis wasting.  Ear/Nose/Throat: Hearing grossly intact, nares w/o erythema or drainage Eyes: PER, EOMI, sclera nonicteric.  Neck: Supple, no gross masses or lesions.  No JVD.  Pulmonary:  Good air movement, no audible wheezing, no use of accessory muscles.  Cardiac: RRR, precordium non-hyperdynamic. Vascular:   Weak thrill weak bruit Vessel Right Left  Radial Palpable Palpable  Brachial Palpable Palpable  Gastrointestinal: soft, non-distended. No guarding/no peritoneal signs.  Musculoskeletal: M/S 5/5 throughout.  No deformity.  Neurologic: CN 2-12 intact. Pain and light touch intact in extremities.  Symmetrical.  Speech is fluent. Motor exam as listed above. Psychiatric: Judgment intact, Mood & affect appropriate for pt's clinical situation. Dermatologic: No rashes or ulcers noted.  No changes consistent with cellulitis.   CBC Lab Results  Component Value Date   WBC 5.3 03/14/2023   HGB 9.5 (L) 03/14/2023   HCT 30.6 (L) 03/14/2023   MCV 99.0 03/14/2023   PLT 128 (L) 03/14/2023    BMET    Component Value Date/Time   NA 135 03/13/2023 0454   K 4.0 03/13/2023 0454   CL 99 03/13/2023 0454   CO2 30 03/13/2023 0454   GLUCOSE 186 (H) 03/13/2023 0454   BUN 30 (H) 03/13/2023 0454   CREATININE 6.93 (H) 03/13/2023 0454   CALCIUM 8.2 (L) 03/13/2023 0454   GFRNONAA 6 (L) 03/13/2023 0454   Estimated Creatinine Clearance: 10.7 mL/min (A) (by C-G formula based on SCr of 6.93 mg/dL  (H)).  COAG Lab Results  Component Value Date   INR 1.6 (H) 03/13/2023   INR 1.2 07/22/2022   INR 1.8 (H) 03/02/2022    Radiology PERIPHERAL VASCULAR CATHETERIZATION  Result Date: 03/14/2023 See surgical note for result.  [place] Upper Ext Art Left Ltd  Result Date: 03/12/2023 CLINICAL DATA:  Left arm swelling and redness surrounding dialysis fistula EXAM: LEFT UPPER EXTREMITY ARTERIAL DUPLEX SCAN TECHNIQUE: Gray-scale sonography as well as color Doppler and duplex ultrasound was performed to evaluate the dialysis fistula of the upper extremity. COMPARISON:  None Available. FINDINGS: Left upper extremity brachiocephalic dialysis graft is identified though is not optimally assessed on this examination. Selected images involving the proximal, mid, and distal portions of the fistula demonstrates patency with appropriate low resistance arterial waveforms, though these are not rigorously assessed on this exam. Flow volumes are not calculated on this examination. A small amount of a poorly loculated perigraft fluid is identified possibly representing perigraft edema or hemorrhage. Deep and medial to the dialysis fistula is a loculated complex fluid collection demonstrating internal debris measuring 3.6 x 2.5 x 2.6 cm demonstrating no internal vascularity possibly representing a a subcutaneous hematoma. Super infection is not excluded on this examination. IMPRESSION: 1. Left upper extremity brachiocephalic dialysis graft is identified though is not optimally assessed on this examination. Scattered images involving the proximal, mid, and distal portions of the fistula demonstrates patency with appropriate low resistance arterial waveforms, though these are not rigidly assessed on this exam. 2. Small amount of a poorly loculated perigraft fluid possibly representing perigraft edema or hemorrhage. 3. 3.6 x 2.5 x 2.6 cm loculated complex fluid collection demonstrating internal debris deep and medial to the dialysis  fistula possibly representing a subcutaneous hematoma. Super  infection is not excluded on this examination. Electronically Signed   By: Helyn Numbers M.D.   On: 03/12/2023 21:16     Assessment/Plan 1. ESRD on hemodialysis [facility]) Recommend:  The patient is experiencing increasing problems with their dialysis access.  Patient should have a duplex ultrasound to better assess the function of the fistula.   The patient will follow up with me in the office after the procedure.  - VAS US DUPLEX DIALYSIS ACCESS (AVF, AVG); Future  2. Essential hypertension Continue antihypertensive medications as already ordered, these medications have been reviewed and there are no changes at this time.  3. Type 2 diabetes mellitus without complication, unspecified whether long term insulin use (HCC) Continue hypoglycemic medications as already ordered, these medications have been reviewed and there are no changes at this time.  Hgb A1C to be monitored as already arranged by primary service  4. Hyperlipidemia with target LDL less than 70 Continue statin as ordered and reviewed, no changes at this time  5. Anticardiolipin antibody positive Anticoagulation is reviewed.  Patient is currently taking Eliquis.    Continue anticoagulation as prescribed no changes at this time.    Levora Dredge, MD  03/29/2023 9:08 AM

## 2023-03-31 IMAGING — CR DG CHEST 2V
2 series · 2 of 2 positions shown · non-contrast
Comparison: None.

CLINICAL DATA: 70-year-old female with midsternal chest pain,
radiating to the left back onset this morning.

EXAM:
CHEST - 2 VIEW

[chest lat]
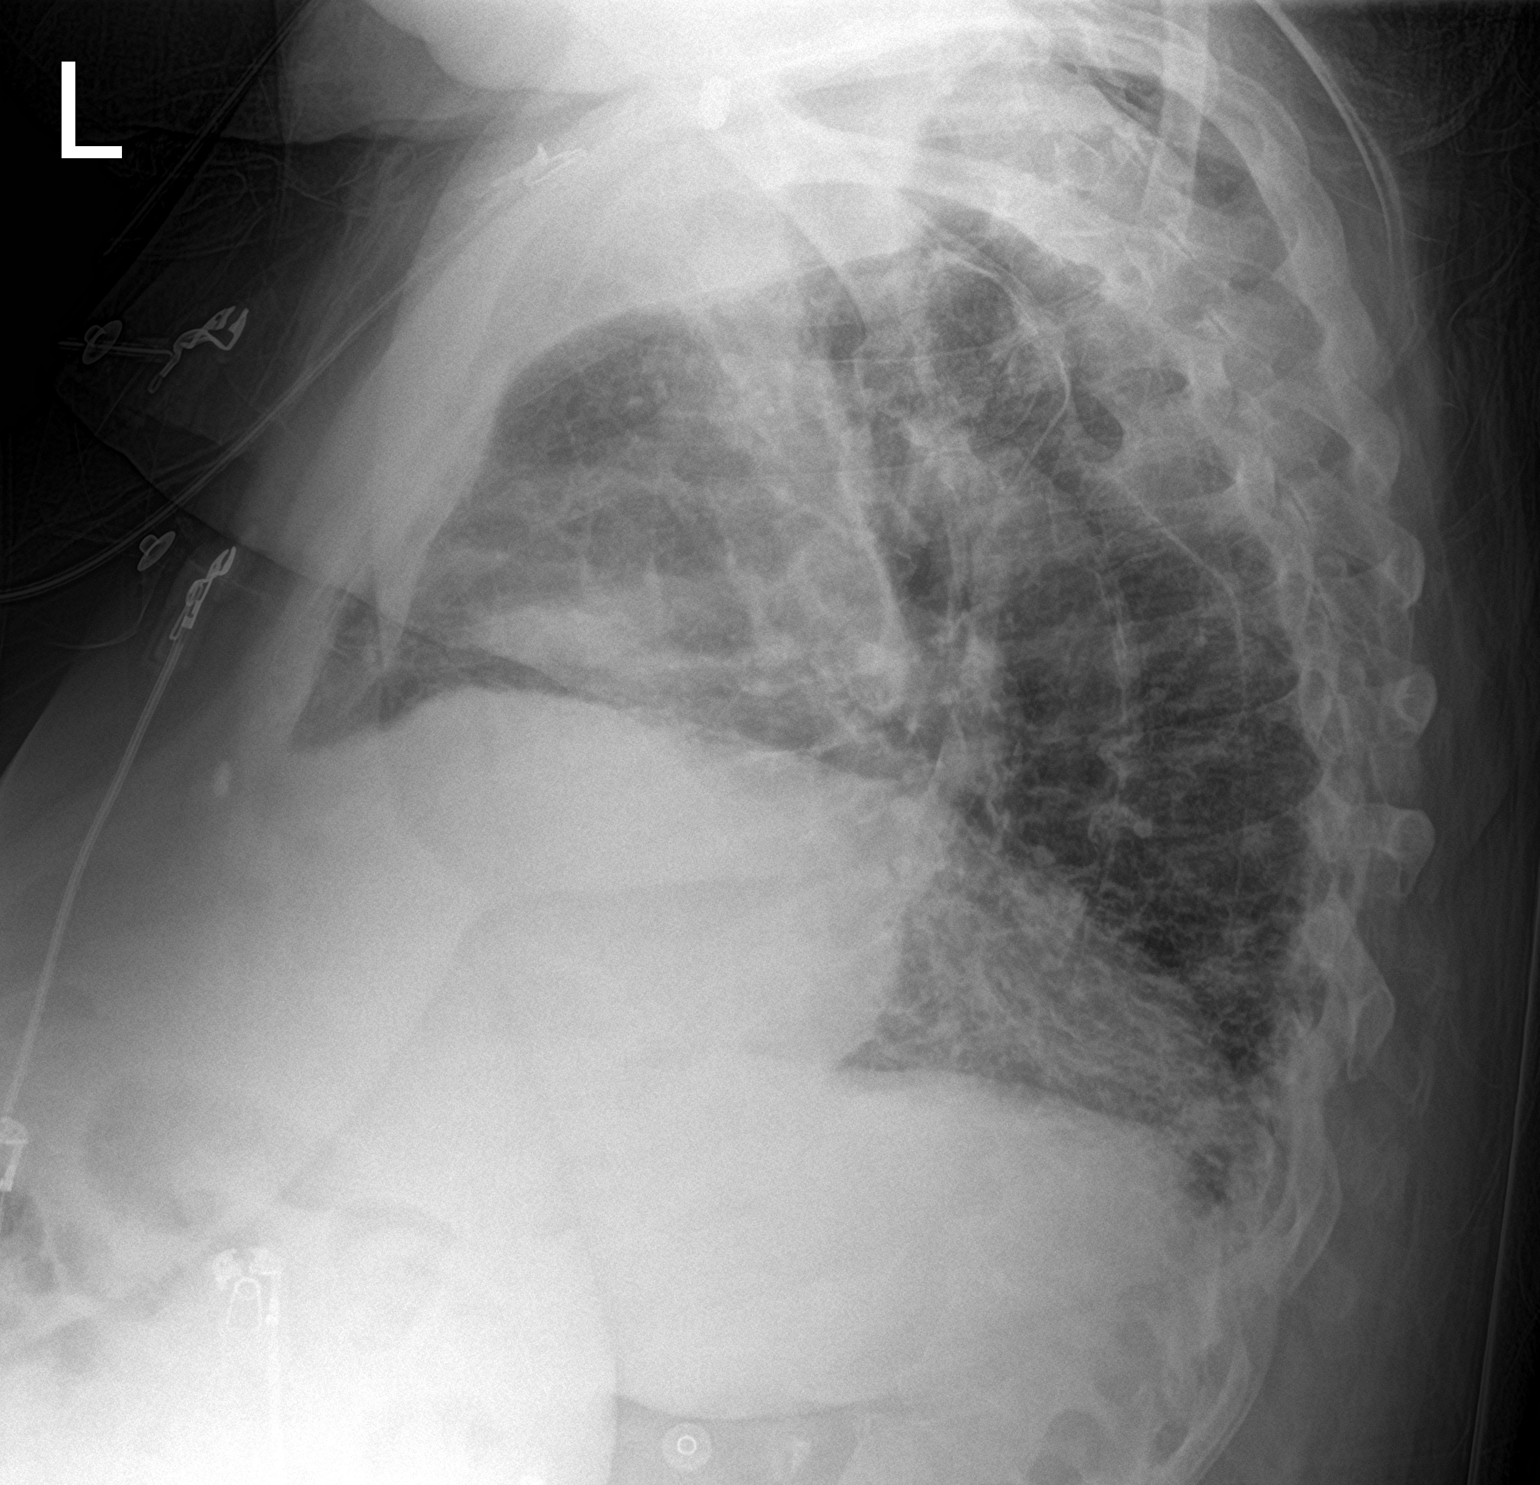

[chest ap]
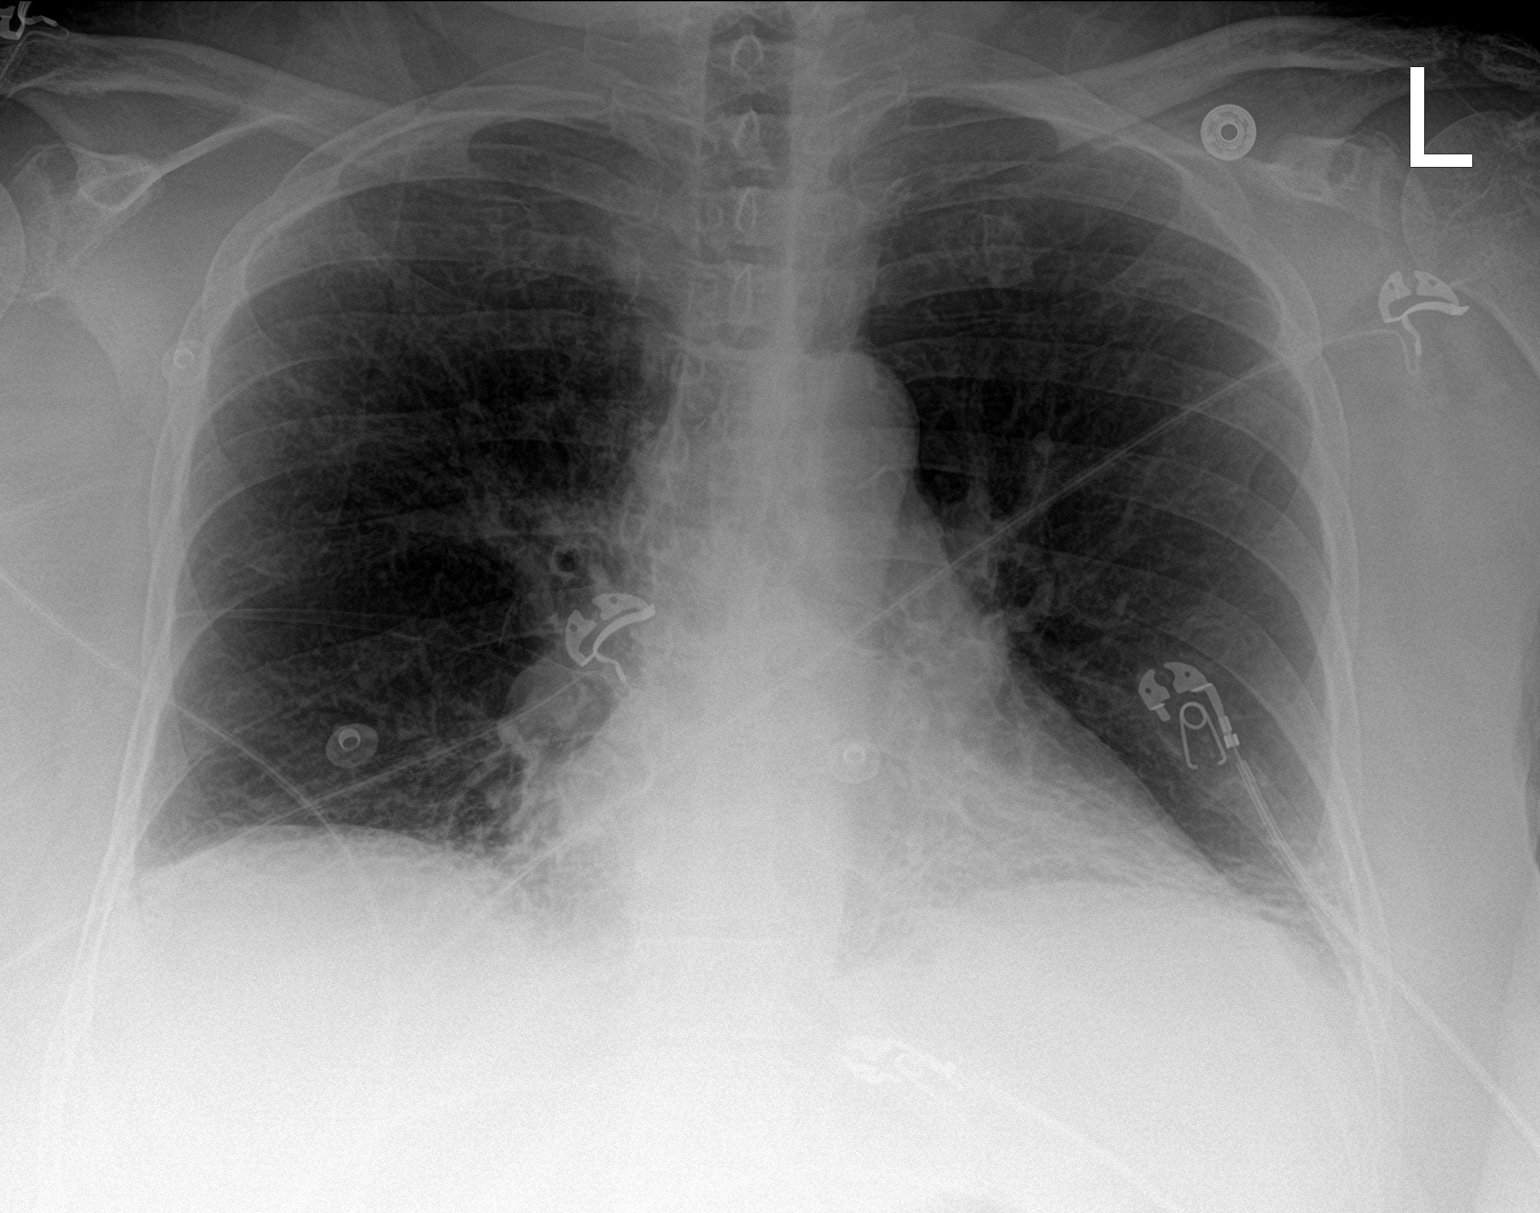

[2 of 2 positions shown; findings below may reference images not displayed]

FINDINGS: Low lung volumes. Borderline cardiomegaly. Other mediastinal
contours are within normal limits. Visualized tracheal air column is
within normal limits. Coarse, reticular pulmonary opacity is
asymmetric in both the upper and lower lungs, most affecting the
left base and right upper lobe. No superimposed pneumothorax,
pulmonary edema, pleural effusion or consolidation.

Osteopenia with exaggerated thoracic kyphosis. No acute osseous
abnormality identified. Negative visible bowel gas.
IMPRESSION: 1. Asymmetric bilateral pulmonary interstitial opacity. Chronic
interstitial lung disease is favored over acute viral/atypical
respiratory infection.
2. Low lung volumes.  Borderline cardiomegaly.

## 2023-04-14 ENCOUNTER — Ambulatory Visit (INDEPENDENT_AMBULATORY_CARE_PROVIDER_SITE_OTHER): Payer: 59

## 2023-04-14 DIAGNOSIS — N186 End stage renal disease: Secondary | ICD-10-CM | POA: Diagnosis not present

## 2023-04-14 DIAGNOSIS — Z992 Dependence on renal dialysis: Secondary | ICD-10-CM | POA: Diagnosis not present

## 2023-04-14 NOTE — Progress Notes (Unsigned)
544

## 2023-04-28 ENCOUNTER — Ambulatory Visit (INDEPENDENT_AMBULATORY_CARE_PROVIDER_SITE_OTHER): Payer: 59 | Admitting: Vascular Surgery

## 2023-04-28 ENCOUNTER — Encounter (INDEPENDENT_AMBULATORY_CARE_PROVIDER_SITE_OTHER): Payer: Self-pay | Admitting: Vascular Surgery

## 2023-04-28 VITALS — BP 128/83 | HR 84 | Resp 20 | Ht 64.5 in | Wt 299.0 lb

## 2023-04-28 DIAGNOSIS — N186 End stage renal disease: Secondary | ICD-10-CM

## 2023-04-28 DIAGNOSIS — E785 Hyperlipidemia, unspecified: Secondary | ICD-10-CM

## 2023-04-28 DIAGNOSIS — Z992 Dependence on renal dialysis: Secondary | ICD-10-CM

## 2023-04-28 DIAGNOSIS — Z794 Long term (current) use of insulin: Secondary | ICD-10-CM

## 2023-04-28 DIAGNOSIS — I10 Essential (primary) hypertension: Secondary | ICD-10-CM | POA: Diagnosis not present

## 2023-04-28 DIAGNOSIS — E119 Type 2 diabetes mellitus without complications: Secondary | ICD-10-CM | POA: Diagnosis not present

## 2023-04-28 NOTE — Progress Notes (Signed)
MRN : [id number]  Jody Nelson is a 72 y.o. (12/08/1950) female who presents with chief complaint of check access.  History of Present Illness:    The patient is seen for evaluation of dialysis access.  The patient has a history of hematoma formation after cannulation of the left brachial axillary AV graft.     Current access is via a catheter which is functioning well the flow rates have been adequate.  There have not been multiple episodes of catheter infection.  The patient denies fever and chills while on dialysis.  No tenderness or drainage at the exit site.  No recent shortening of the patient's walking distance or new symptoms consistent with claudication.  No history of rest pain symptoms. No new ulcers or wounds of the lower extremities have occurred.  The patient denies amaurosis fugax or recent TIA symptoms. There are no recent neurological changes noted. There is no history of DVT, PE or superficial thrombophlebitis. No recent episodes of angina or shortness of breath documented.  hematoma  Current Meds  Medication Sig   acetaminophen (TYLENOL) 325 MG tablet Take 2 tablets by mouth every 8 (eight) hours as needed.   albuterol (PROVENTIL) (2.5 MG/3ML) 0.083% nebulizer solution Take 2.5 mg by nebulization every 6 (six) hours as needed for wheezing or shortness of breath.   albuterol (VENTOLIN HFA) 108 (90 Base) MCG/ACT inhaler Inhale 1-2 puffs into the lungs every 4 (four) hours as needed for wheezing or shortness of breath.   apixaban (ELIQUIS) 5 MG TABS tablet Take 1 tablet (5 mg total) by mouth 2 (two) times daily.   aspirin 81 MG chewable tablet Chew 81 mg by mouth daily.   atorvastatin (LIPITOR) 80 MG tablet Take 80 mg by mouth daily.   calcitRIOL (ROCALTROL) 0.25 MCG capsule Take 0.25 mcg by mouth daily.   calcium acetate (PHOSLO) 667 MG capsule Take one capsule by mouth twice daily with food on Mondays, Wednesdays, and Fridays. Take one capsule 3  times daily on Tuesdays, Thursdays, Saturdays, and Sundays.   Calcium Carb-Cholecalciferol (OYSTER SHELL CALCIUM W/D) 500-5 MG-MCG TABS Take 1 tablet by mouth every Tuesday, Thursday, Saturday, and Sunday.   carvedilol (COREG) 25 MG tablet Take 1 tablet by mouth 2 (two) times daily with a meal.   diclofenac Sodium (VOLTAREN) 1 % GEL Apply 2 g topically every 4 (four) hours as needed (pain). Apply bilateral hips   docusate sodium (COLACE) 100 MG capsule Take 100 mg by mouth 2 (two) times daily.   ferrous sulfate 324 (65 Fe) MG TBEC Take 1 tablet by mouth daily.   fluticasone (FLONASE) 50 MCG/ACT nasal spray Place 1 spray into both nostrils daily.   folic acid (FOLVITE) 1 MG tablet Take 1 mg by mouth daily.   HYDROcodone-acetaminophen (NORCO/VICODIN) 5-325 MG tablet Take 1 tablet by mouth every 6 (six) hours as needed for moderate pain.   insulin glargine (LANTUS) 100 UNIT/ML Solostar Pen Inject 10 Units into the skin at bedtime.   insulin lispro (HUMALOG) 100 UNIT/ML injection Inject 2-12 Units into the skin in the morning, at noon, and at bedtime. Per sliding scale   latanoprost (XALATAN) 0.005 % ophthalmic solution Place 1 drop into both eyes at bedtime.   lidocaine-prilocaine (EMLA) cream Apply 1 application  topically 3 (three) times a week. On mon, wed, and friday   loratadine (CLARITIN) 10 MG tablet Take 10  mg by mouth daily.   magnesium hydroxide (MILK OF MAGNESIA) 400 MG/5ML suspension Take 30 mLs by mouth daily as needed for mild constipation.   montelukast (SINGULAIR) 10 MG tablet Take 10 mg by mouth daily.   Multiple Vitamins-Minerals (HEALTHY EYES SUPERVISION 2) CAPS Take 1 capsule by mouth 2 (two) times daily.   omeprazole (PRILOSEC) 10 MG capsule Take 10 mg by mouth daily.   OZEMPIC, 1 MG/DOSE, 4 MG/3ML SOPN Inject 1 mg into the skin every Friday.   polyethylene glycol (MIRALAX / GLYCOLAX) 17 g packet Take 17 g by mouth daily.   pregabalin (LYRICA) 25 MG capsule Take 25 mg by mouth  daily.   torsemide (DEMADEX) 10 MG tablet Take 10 mg by mouth daily.    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   At high risk for falls    Atypical chest pain    Bilateral lower extremity edema    Chronic right sided weakness    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   a.) MRI brain 02/27/2011: old lacunar infarct in RIGHT pons and medulla oblongata   Cryptogenic stroke (HCC) 10/11/2012   a.) CT brain 10/11/2012: tiny old lacunar infarct in head of caudate nucleus (left)   Cryptogenic stroke (HCC) 10/12/2012   a.) MRI brain 10/12/2012: multiple acute LEFT hemispheric infarcts   Cryptogenic stroke (HCC) 06/26/2014   a.) MRI brain 06/26/2014: acute RIGHT posterior frontal lobe infarct involving both and grey matter   Cryptogenic stroke (HCC) 07/29/2015   a.) MRI brain 07/29/2015: 2 foci of diffusion restriction in the LEFT parietal lobe consistent with acute infarction   Cryptogenic stroke (HCC) 07/24/2017   a.) MRI brain 07/24/2017: punctate focus of acute ischemia in the medial LEFT temporal lobe   DDD (degenerative disc disease), cervical    Depression    DOE (dyspnea on exertion)    Dysarthria as late effect of stroke    ESRD (end stage renal disease) on dialysis (HCC)    a.) M-W-F   GERD (gastroesophageal reflux disease)    Hallux valgus, bilateral    Heterozygous factor V Leiden mutation (HCC) 07/18/2018   History of cardiac catheterization 08/30/2008   a.) LHC 08/30/2008: EF >60%, LVEDP 18 mmHg, normal coronaries.   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   a.) 07/18/2018 --> 22 mol/L   Hypertension    Insomnia    a.) melatonin + trazodone PRN   Long term current use of anticoagulant    a.) apixaban   Lymphedema of both lower extremities    Meralgia paresthetica, left lower limb    OSA (obstructive sleep apnea)    a.) does not require nocturnal PAP therapy   Osteoarthritis    Pneumonia    Pseudophakia of both eyes    Sarcoidosis    Secondary hyperparathyroidism of renal origin (HCC)  Type 2 diabetes mellitus treated with insulin (HCC)    Vitamin D deficiency     Past Surgical History:  Procedure Laterality Date   A/V FISTULAGRAM Left 12/28/2022   Procedure: A/V Fistulagram;  Surgeon: Renford Dills, MD;  Location: [facility];  Service: Cardiovascular;  Laterality: Left;   APPLICATION OF WOUND VAC Right 06/15/2022   Procedure: APPLICATION OF WOUND VAC;  Surgeon: Signa Kell, MD;  Location: [facility];  Service: Orthopedics;  Laterality: Right;   AV FISTULA PLACEMENT Left 04/16/2022   Procedure: INSERTION OF ARTERIOVENOUS (AV) GORE-TEX GRAFT ARM (BRACHIAL AXILLARY);  Surgeon: Renford Dills, MD;  Location: [facility];  Service: Vascular;  Laterality: Left;   CESAREAN SECTION     DIALYSIS/PERMA CATHETER INSERTION N/A 10/14/2021   Procedure: DIALYSIS/PERMA CATHETER INSERTION;  Surgeon: Annice Needy, MD;  Location: [facility];  Service: Cardiovascular;  Laterality: N/A;   DIALYSIS/PERMA CATHETER INSERTION N/A 03/14/2023   Procedure: DIALYSIS/PERMA CATHETER INSERTION;  Surgeon: Annice Needy, MD;  Location: [facility];  Service: Cardiovascular;  Laterality: N/A;   DIALYSIS/PERMA CATHETER REMOVAL N/A 08/02/2022   Procedure:  DIALYSIS/PERMA CATHETER REMOVAL;  Surgeon: Annice Needy, MD;  Location: [facility];  Service: Cardiovascular;  Laterality: N/A;   FRACTURE SURGERY     leg right fracture several surgeries, rods   INCISION AND DRAINAGE OF WOUND Right 06/15/2022   Procedure: IRRIGATION AND DEBRIDEMENT WOUND;  Surgeon: Signa Kell, MD;  Location: [facility];  Service: Orthopedics;  Laterality: Right;    Social History Social History   Tobacco Use   Smoking status: Former    Types: Cigarettes   Smokeless tobacco: Never  Substance Use Topics   Alcohol use: Never   Drug use: Never    Family History Family History  Problem Relation Age of Onset   Diabetes Mother    Colon cancer Sister    Diabetes Sister    Cancer Maternal Uncle    Clotting disorder Paternal Grandmother    Colon cancer Paternal Grandfather     Allergies  Allergen Reactions   Sulfamethoxazole-Trimethoprim     Other reaction(s): Kidney Disorder Other reaction(s): Kidney Disorder Hyperkalemia, AKI Hyperkalemia, AKI      REVIEW OF SYSTEMS (Negative unless checked)  Constitutional: [] Weight loss  [] Fever  [] Chills Cardiac: [] Chest pain   [] Chest pressure   [] Palpitations   [] Shortness of breath when laying flat   [] Shortness of breath with exertion. Vascular:  [] Pain in legs with walking   [] Pain in legs at rest  [] History of DVT   [] Phlebitis   [] Swelling in legs   [] Varicose veins   [] Non-healing ulcers Pulmonary:   [] Uses home oxygen   [] Productive cough   [] Hemoptysis   [] Wheeze  [] COPD   [] Asthma Neurologic:  [] Dizziness   [] Seizures   [] History of stroke   [] History of TIA  [] Aphasia   [] Vissual changes   [] Weakness or numbness in arm   [] Weakness or numbness in leg Musculoskeletal:   [] Joint swelling   [] Joint pain   [] Low back pain Hematologic:  [] Easy bruising  [] Easy bleeding   [] Hypercoagulable state   [] Anemic Gastrointestinal:  [] Diarrhea   [] Vomiting  [] Gastroesophageal reflux/heartburn   [] Difficulty  swallowing. Genitourinary:  [x] Chronic kidney disease   [] Difficult urination  [] Frequent urination   [] Blood in urine Skin:  [] Rashes   [] Ulcers  Psychological:  [] History of anxiety   []  History of major depression.  Physical Examination  Vitals:   04/28/23  1333  BP: 128/83  Pulse: 84  Resp: 20  Weight: 299 lb (135.6 kg)  Height: 5' 4.5" (1.638 m)   Body mass index is 50.53 kg/m. Gen: WD/WN, NAD Head: [place]/AT, No temporalis wasting.  Ear/Nose/Throat: Hearing grossly intact, nares w/o erythema or drainage Eyes: PER, EOMI, sclera nonicteric.  Neck: Supple, no gross masses or lesions.  No JVD.  Pulmonary:  Good air movement, no audible wheezing, no use of accessory muscles.  Cardiac: RRR, precordium non-hyperdynamic. Vascular:   Left IJ catheter clean dry and intact; the left brachial axillary AV graft has a good thrill good bruit.  The hematoma is largely resolved. Vessel Right Left  Radial Palpable Palpable  Brachial Palpable Palpable  Gastrointestinal: soft, non-distended. No guarding/no peritoneal signs.  Musculoskeletal: M/S 5/5 throughout.  No deformity.  Neurologic: CN 2-12 intact. Pain and light touch intact in extremities.  Symmetrical.  Speech is fluent. Motor exam as listed above. Psychiatric: Judgment intact, Mood & affect appropriate for pt's clinical situation. Dermatologic: No rashes or ulcers noted.  No changes consistent with cellulitis.   CBC Lab Results  Component Value Date   WBC 5.3 03/14/2023   HGB 9.5 (L) 03/14/2023   HCT 30.6 (L) 03/14/2023   MCV 99.0 03/14/2023   PLT 128 (L) 03/14/2023    BMET    Component Value Date/Time   NA 135 03/13/2023 0454   K 4.0 03/13/2023 0454   CL 99 03/13/2023 0454   CO2 30 03/13/2023 0454   GLUCOSE 186 (H) 03/13/2023 0454   BUN 30 (H) 03/13/2023 0454   CREATININE 6.93 (H) 03/13/2023 0454   CALCIUM 8.2 (L) 03/13/2023 0454   GFRNONAA 6 (L) 03/13/2023 0454   CrCl cannot be calculated (Patient's most recent lab  result is older than the maximum 21 days allowed.).  COAG Lab Results  Component Value Date   INR 1.6 (H) 03/13/2023   INR 1.2 07/22/2022   INR 1.8 (H) 03/02/2022    Radiology VAS US DUPLEX DIALYSIS ACCESS (AVF, AVG) Result Date: 04/19/2023 DIALYSIS ACCESS Patient Name:  TYRONE TROISE  Date of Exam:   04/14/2023 Medical Rec #: [id number]    Accession #:    [id number] Date of Birth: Nov 18, 1950    Patient Gender: F Patient Age:   58 years Exam Location:  [facility] Procedure:      VAS US DUPLEX DIALYSIS ACCESS (AVF, AVG) Referring Phys: Levora Dredge --------------------------------------------------------------------------------  Access Site: Left Upper Extremity. Access Type: Brachial Axillary AVG. History: 04/16/22: Left brachial-axillary AVG placement;. Comparison Study: 12/16/2022 Performing Technologist: Debbe Bales RVS  Examination Guidelines: A complete evaluation includes B-mode imaging, spectral Doppler, color Doppler, and power Doppler as needed of all accessible portions of each vessel. Unilateral testing is considered an integral part of a complete examination. Limited examinations for reoccurring indications may be performed as noted.  Findings:   +--------------------+----------+-----------------+--------+ AVG                 PSV (cm/s)Flow Vol (mL/min)Describe +--------------------+----------+-----------------+--------+ Native artery inflow   240           848                +--------------------+----------+-----------------+--------+ Arterial anastomosis   350                              +--------------------+----------+-----------------+--------+ Prox graft             362                              +--------------------+----------+-----------------+--------+  Mid graft              138                              +--------------------+----------+-----------------+--------+ Distal graft           249                               +--------------------+----------+-----------------+--------+ Venous anastomosis     164                              +--------------------+----------+-----------------+--------+ Venous outflow          36                              +--------------------+----------+-----------------+--------+ +--------------+-------------+---------+---------+---------+-------------------+               Diameter (cm)  Depth  Branching   PSV       Flow Volume                                  (cm)             (cm/s)       (ml/min)       +--------------+-------------+---------+---------+---------+-------------------+ Lt Rad Art                                      32                        Dist                                                                      +--------------+-------------+---------+---------+---------+-------------------+  Summary: The Left Brachial Axillary AVG appears to be Normal; Flow Volume appears to be Normal.  *See table(s) above for measurements and observations.  Diagnosing physician: Levora Dredge MD Electronically signed by Levora Dredge MD on 04/19/2023 at 9:35:02 AM.   --------------------------------------------------------------------------------   Final      Assessment/Plan 1. End-stage renal disease on hemodialysis (HCC) (Primary) Recommend:  The patient is doing well and currently has adequate dialysis access.  The dialysis center can begin cannulating the left brachial axillary AV graft beginning May 24, 2023.  The patient's dialysis center is not reporting any access issues. Flow pattern is stable when compared to the prior ultrasound.  The patient should have a duplex ultrasound of the dialysis access in 6 months. The patient will follow-up with me in the office after each ultrasound   - VAS US DUPLEX DIALYSIS ACCESS (AVF, AVG); Future  2. Essential hypertension Continue antihypertensive medications as already ordered, these  medications have been reviewed and there are no changes at this time.  3. Diabetes mellitus with insulin therapy (HCC) Continue hypoglycemic medications as already ordered, these medications have been reviewed and there are no changes at this time.  Hgb A1C to be monitored as already arranged by primary service  4. Dyslipidemia Continue statin as ordered and reviewed, no changes at this time    Levora Dredge, MD  04/28/2023 1:56 PM

## 2023-06-02 ENCOUNTER — Ambulatory Visit (INDEPENDENT_AMBULATORY_CARE_PROVIDER_SITE_OTHER): Payer: 59 | Admitting: Vascular Surgery

## 2023-06-02 ENCOUNTER — Encounter (INDEPENDENT_AMBULATORY_CARE_PROVIDER_SITE_OTHER): Payer: 59

## 2023-06-22 ENCOUNTER — Telehealth (INDEPENDENT_AMBULATORY_CARE_PROVIDER_SITE_OTHER): Payer: Self-pay

## 2023-06-22 NOTE — Telephone Encounter (Signed)
Jody Nelson called back from [facility], the patient is scheduled with Dr. Wyn Quaker on 06/27/23 with a 2:15 pm arrival time to have a permcath removal at the [facility]. Pre-procedure instructions were discussed and will be sent to [facility] at [facility].

## 2023-06-22 NOTE — Telephone Encounter (Signed)
I attempted to contact the patient to schedule a permcath removal, the patient's mobile phone a message was left.and I contacted [facility] and left a message for Vivia Birmingham that is over transportation to get a return call.

## 2023-06-24 ENCOUNTER — Telehealth (INDEPENDENT_AMBULATORY_CARE_PROVIDER_SITE_OTHER): Payer: Self-pay

## 2023-06-24 NOTE — Telephone Encounter (Signed)
Buffy called about a new injury to the patients left great toe. She stated the nail is crushed into the tissue of the toe and she needs an appointment as soon as possible.   If you call ask for Buffy or Davetta   Please advise

## 2023-06-24 NOTE — Telephone Encounter (Signed)
After speaking with another employee, she stated that the wound is being treated. The concern is that [facility] can't feel the pain. The provider there would like for her to get her blood circulation checked.   Per Vivia Birmingham- Patient can come in for ABI's

## 2023-06-27 ENCOUNTER — Other Ambulatory Visit (INDEPENDENT_AMBULATORY_CARE_PROVIDER_SITE_OTHER): Payer: Self-pay | Admitting: Nurse Practitioner

## 2023-06-27 ENCOUNTER — Ambulatory Visit
Admission: RE | Admit: 2023-06-27 | Discharge: 2023-06-27 | Disposition: A | Discharge status: Home / Self Care | Payer: 59 | Attending: Vascular Surgery | Admitting: Vascular Surgery

## 2023-06-27 ENCOUNTER — Encounter
Admission: RE | Disposition: A | Discharge status: Home / Self Care | Payer: Self-pay | Source: Home / Self Care | Attending: Vascular Surgery

## 2023-06-27 DIAGNOSIS — Z4589 Encounter for adjustment and management of other implanted devices: Secondary | ICD-10-CM

## 2023-06-27 DIAGNOSIS — Z95828 Presence of other vascular implants and grafts: Secondary | ICD-10-CM | POA: Diagnosis not present

## 2023-06-27 DIAGNOSIS — Z992 Dependence on renal dialysis: Secondary | ICD-10-CM | POA: Diagnosis not present

## 2023-06-27 DIAGNOSIS — N186 End stage renal disease: Secondary | ICD-10-CM | POA: Diagnosis not present

## 2023-06-27 DIAGNOSIS — S97112S Crushing injury of left great toe, sequela: Secondary | ICD-10-CM

## 2023-06-27 HISTORY — PX: DIALYSIS/PERMA CATHETER REMOVAL: CATH118289

## 2023-06-27 SURGERY — DIALYSIS/PERMA CATHETER REMOVAL
Anesthesia: LOCAL

## 2023-06-27 MED ORDER — LIDOCAINE-EPINEPHRINE (PF) 1 %-1:200000 IJ SOLN
INTRAMUSCULAR | Status: DC | PRN
  Administered 2023-06-27: 10 mL

## 2023-06-27 SURGICAL SUPPLY — 2 items
CHLORAPREP W/TINT 26 (MISCELLANEOUS) IMPLANT
TRAY LACERAT/PLASTIC (MISCELLANEOUS) IMPLANT

## 2023-06-27 NOTE — Op Note (Signed)
Operative Note     Preoperative diagnosis:   1. ESRD with functional permanent access  Postoperative diagnosis:  1. ESRD with functional permanent access  Procedure:  Removal of left jugular Permcath  Surgeon:  Festus Barren, MD  Anesthesia:  Local  EBL:  Minimal  Indication for the Procedure:  The patient has a functional permanent dialysis access and no longer needs their permcath.  This can be removed.  Risks and benefits are discussed and informed consent is obtained.  Description of the Procedure:  The patient's left neck, chest and existing catheter were sterilely prepped and draped. The area around the catheter was anesthetized copiously with 1% lidocaine. The catheter was dissected out with curved hemostats until the cuff was freed from the surrounding fibrous sheath. The fiber sheath was transected, and the catheter was then removed in its entirety using gentle traction. Pressure was held and sterile dressings were placed. The patient tolerated the procedure well and was taken to the recovery room in stable condition.     Festus Barren  06/27/2023, 2:56 PM This note was created with Dragon Medical transcription system. Any errors in dictation are purely unintentional.

## 2023-06-27 NOTE — Discharge Instructions (Addendum)
You may get perm tomorrow but make sure upper left dressing is covering former perm cath site. Remove dressing left chest on 06/28/2023. You may shower tomorrow. Tunneled Catheter Removal, Care After Refer to this sheet in the next few weeks. These instructions provide you with information about caring for yourself after your procedure. Your health care provider may also give you more specific instructions. Your treatment has been planned according to current medical practices, but problems sometimes occur. Call your health care provider if you have any problems or questions after your procedure. What can I expect after the procedure? After the procedure, it is common to have: Some mild redness, swelling, and pain around your catheter site.   Follow these instructions at home: Incision care  Check your removal site  every day for signs of infection. Check for: More redness, swelling, or pain. More fluid or blood. Warmth. Pus or a bad smell. Remove your dressing in 48hrs leave open to air  Activity  Return to your normal activities as told by your health care provider. Ask your health care provider what activities are safe for you. Do not lift anything that is heavier than 10 lb (4.5 kg) for 3 days  You may shower tomorrow  Contact a health care provider if: You have more fluid or blood coming from your removal site You have more redness, swelling, or pain at your incisions or around the area where your catheter was removed Your removal site feel warm to the touch. You feel unusually weak. You feel nauseous.. Get help right away if You have swelling in your arm, shoulder, neck, or face. You develop chest pain. You have difficulty breathing. You feel dizzy or light-headed. You have pus or a bad smell coming from your removal site You have a fever. You develop bleeding from your removal site, and your bleeding does not stop. This information is not intended to replace advice given to  you by your health care provider. Make sure you discuss any questions you have with your health care provider. Document Released: 04/12/2012 Document Revised: 12/28/2015 Document Reviewed: 01/20/2015 Elsevier Interactive Patient Education  2017 [employer].

## 2023-06-27 NOTE — H&P (Signed)
[facility] Admission History & Physical  MRN : [id number]  Jody Nelson is a 73 y.o. ([birth year]) female who presents with chief complaint of No chief complaint on file. Marland Kitchen  History of Present Illness: I am asked to evaluate the patient by the dialysis center. The patient was sent here because they have a nonfunctioning tunneled catheter and a functioning left arm AVG.  The patient reports they're not been any problems with any of their dialysis runs. They are reporting good flows with good parameters at dialysis.   Patient denies pain or tenderness overlying the access.  There is no pain with dialysis.  The patient denies hand pain or finger pain consistent with steal syndrome.  No fevers or chills while on dialysis.    No current facility-administered medications for this encounter.    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   At high risk for falls    Atypical chest pain    Bilateral lower extremity edema    Chronic right sided weakness    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   a.) MRI brain 02/27/2011: old lacunar infarct in RIGHT pons and medulla oblongata   Cryptogenic stroke (HCC) 10/11/2012   a.) CT brain 10/11/2012: tiny old lacunar infarct in head of caudate nucleus (left)   Cryptogenic stroke (HCC) 10/12/2012   a.) MRI brain 10/12/2012: multiple acute LEFT hemispheric infarcts   Cryptogenic stroke (HCC) 06/26/2014   a.) MRI brain 06/26/2014: acute RIGHT posterior frontal lobe infarct involving both and grey  matter   Cryptogenic stroke (HCC) 07/29/2015   a.) MRI brain 07/29/2015: 2 foci of diffusion restriction in the LEFT parietal lobe consistent with acute infarction   Cryptogenic stroke (HCC) 07/24/2017   a.) MRI brain 07/24/2017: punctate focus of acute ischemia in the medial LEFT temporal lobe   DDD (degenerative disc disease), cervical    Depression    DOE (dyspnea on exertion)    Dysarthria as late effect of stroke    ESRD (end stage renal disease) on dialysis (HCC)    a.) M-W-F   GERD (gastroesophageal reflux disease)    Hallux valgus, bilateral    Heterozygous factor V Leiden mutation (HCC) 07/18/2018   History of cardiac catheterization 08/30/2008   a.) LHC 08/30/2008: EF >60%, LVEDP 18 mmHg, normal coronaries.   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   a.) 07/18/2018 --> 22 mol/L   Hypertension    Insomnia    a.) melatonin + trazodone PRN   Long term current use of anticoagulant    a.) apixaban   Lymphedema of both lower extremities    Meralgia paresthetica, left lower limb    OSA (obstructive sleep apnea)    a.) does not require nocturnal PAP therapy   Osteoarthritis    Pneumonia    Pseudophakia of both eyes  Sarcoidosis    Secondary hyperparathyroidism of renal origin (HCC)    Type 2 diabetes mellitus treated with insulin (HCC)    Vitamin D deficiency     Past Surgical History:  Procedure Laterality Date   A/V FISTULAGRAM Left 12/28/2022   Procedure: A/V Fistulagram;  Surgeon: Renford Dills, MD;  Location: [facility];  Service: Cardiovascular;  Laterality: Left;   APPLICATION OF WOUND VAC Right 06/15/2022   Procedure: APPLICATION OF WOUND VAC;  Surgeon: Signa Kell, MD;  Location: [facility];   Service: Orthopedics;  Laterality: Right;   AV FISTULA PLACEMENT Left 04/16/2022   Procedure: INSERTION OF ARTERIOVENOUS (AV) GORE-TEX GRAFT ARM (BRACHIAL AXILLARY);  Surgeon: Renford Dills, MD;  Location: [facility];  Service: Vascular;  Laterality: Left;   CESAREAN SECTION     DIALYSIS/PERMA CATHETER INSERTION N/A 10/14/2021   Procedure: DIALYSIS/PERMA CATHETER INSERTION;  Surgeon: Annice Needy, MD;  Location: [facility];  Service: Cardiovascular;  Laterality: N/A;   DIALYSIS/PERMA CATHETER INSERTION N/A 03/14/2023   Procedure: DIALYSIS/PERMA CATHETER INSERTION;  Surgeon: Annice Needy, MD;  Location: [facility];  Service: Cardiovascular;  Laterality: N/A;   DIALYSIS/PERMA CATHETER REMOVAL N/A 08/02/2022   Procedure: DIALYSIS/PERMA CATHETER REMOVAL;  Surgeon: Annice Needy, MD;  Location: [facility];  Service: Cardiovascular;  Laterality: N/A;   FRACTURE SURGERY     leg right fracture several surgeries, rods   INCISION AND DRAINAGE OF WOUND Right 06/15/2022   Procedure: IRRIGATION AND DEBRIDEMENT WOUND;  Surgeon: Signa Kell, MD;  Location: [facility];  Service: Orthopedics;  Laterality: Right;     Social History   Tobacco Use   Smoking status: Former    Types: Cigarettes   Smokeless tobacco: Never  Substance Use Topics   Alcohol use: Never   Drug use: Never     Family History  Problem Relation Age of Onset   Diabetes Mother    Colon cancer Sister    Diabetes Sister    Cancer Maternal Uncle    Clotting disorder Paternal Grandmother    Colon cancer Paternal Grandfather     No family history of bleeding or clotting disorders, autoimmune disease or porphyria  Allergies  Allergen Reactions   Sulfamethoxazole-Trimethoprim     Other reaction(s): Kidney Disorder Other reaction(s): Kidney Disorder Hyperkalemia, AKI Hyperkalemia, AKI      REVIEW OF SYSTEMS (Negative unless checked)  Constitutional: [] Weight loss  [] Fever  [] Chills Cardiac:  [] Chest pain   [] Chest pressure   [x] Palpitations   [] Shortness of breath when laying flat   [] Shortness of breath at rest   [x] Shortness of breath with exertion. Vascular:  [] Pain in legs with walking   [] Pain in legs at rest   [] Pain in legs when laying flat   [] Claudication   [] Pain in feet when walking  [] Pain in feet at rest  [] Pain in feet when laying flat   [] History of DVT   [] Phlebitis   [] Swelling in legs   [] Varicose veins   [] Non-healing ulcers Pulmonary:   [] Uses home oxygen   [] Productive cough   [] Hemoptysis   [] Wheeze  [] COPD   [] Asthma Neurologic:  [] Dizziness  [] Blackouts   [] Seizures   [] History of stroke   [] History of TIA  [] Aphasia   [] Temporary blindness   [] Dysphagia   [] Weakness or numbness in arms   [] Weakness or numbness in legs Musculoskeletal:  [x] Arthritis   [] Joint swelling   [x] Joint pain   [] Low back pain Hematologic:  []   Easy bruising  [] Easy bleeding   [] Hypercoagulable state   [x] Anemic  [] Hepatitis Gastrointestinal:  [] Blood in stool   [] Vomiting blood  [x] Gastroesophageal reflux/heartburn   [] Difficulty swallowing. Genitourinary:  [x] Chronic kidney disease   [] Difficult urination  [] Frequent urination  [] Burning with urination   [] Blood in urine Skin:  [] Rashes   [] Ulcers   [] Wounds Psychological:  [] History of anxiety   []  History of major depression.  Physical Examination  There were no vitals filed for this visit. There is no height or weight on file to calculate BMI. Gen: WD/WN, NAD Head: [place]/AT, No temporalis wasting.  Ear/Nose/Throat: Hearing grossly intact, nares w/o erythema or drainage, oropharynx w/o Erythema/Exudate,  Eyes: Conjunctiva clear, sclera non-icteric Neck: Trachea midline.  No JVD.  Pulmonary:  Good air movement, respirations not labored, no use of accessory muscles.  Cardiac: RRR, normal S1, S2. Vascular: ; left arm AVG with good thrill Vessel Right Left  Radial Palpable Palpable   Musculoskeletal: M/S 5/5 throughout.  Extremities  without ischemic changes.  No deformity or atrophy.  Neurologic: Sensation grossly intact in extremities.  Symmetrical.  Speech is fluent. Motor exam as listed above. Psychiatric: Judgment intact, Mood & affect appropriate for pt's clinical situation. Dermatologic: No rashes or ulcers noted.  No cellulitis or open wounds.    CBC Lab Results  Component Value Date   WBC 5.3 03/14/2023   HGB 9.5 (L) 03/14/2023   HCT 30.6 (L) 03/14/2023   MCV 99.0 03/14/2023   PLT 128 (L) 03/14/2023    BMET    Component Value Date/Time   NA 135 03/13/2023 0454   K 4.0 03/13/2023 0454   CL 99 03/13/2023 0454   CO2 30 03/13/2023 0454   GLUCOSE 186 (H) 03/13/2023 0454   BUN 30 (H) 03/13/2023 0454   CREATININE 6.93 (H) 03/13/2023 0454   CALCIUM 8.2 (L) 03/13/2023 0454   GFRNONAA 6 (L) 03/13/2023 0454   CrCl cannot be calculated (Patient's most recent lab result is older than the maximum 21 days allowed.).  COAG Lab Results  Component Value Date   INR 1.6 (H) 03/13/2023   INR 1.2 07/22/2022   INR 1.8 (H) 03/02/2022    Radiology No results found.  Assessment/Plan 1.  Complication dialysis device with thrombosis AV access:  Patient's Tunneled catheter is not being used. The patient has an extremity access that is functioning well. Therefore, the patient will undergo removal of the tunneled catheter under local anesthesia.  The risks and benefits were described to the patient.  All questions were answered.  The patient agrees to proceed with angiography and intervention. Potassium will be drawn to ensure that it is an appropriate level prior to performing intervention. 2.  End-stage renal disease requiring hemodialysis:  Patient will continue dialysis therapy without further interruption if a successful intervention is not achieved then a tunneled catheter will be placed. Dialysis has already been arranged. 3.  Hypertension:  Patient will continue medical management; nephrology is following no  changes in oral medications. 4. Diabetes mellitus:  Glucose will be monitored and oral medications been held this morning once the patient has undergone the patient's procedure po intake will be reinitiated and again Accu-Cheks will be used to assess the blood glucose level and treat as needed. The patient will be restarted on the patient's usual hypoglycemic regime 5.  Coronary artery disease:  EKG will be monitored. Nitrates will be used if needed. The patient's oral cardiac medications will be continued.    Festus Barren, MD  06/27/2023 2:14 PM

## 2023-06-28 ENCOUNTER — Encounter: Payer: Self-pay | Admitting: Vascular Surgery

## 2023-06-28 ENCOUNTER — Ambulatory Visit (INDEPENDENT_AMBULATORY_CARE_PROVIDER_SITE_OTHER): Payer: 59

## 2023-06-28 DIAGNOSIS — S97112S Crushing injury of left great toe, sequela: Secondary | ICD-10-CM

## 2023-07-16 ENCOUNTER — Emergency Department

## 2023-07-16 ENCOUNTER — Emergency Department
Admission: EM | Admit: 2023-07-16 | Discharge: 2023-07-17 | Disposition: A | Discharge status: Home / Self Care | Attending: Emergency Medicine | Admitting: Emergency Medicine

## 2023-07-16 ENCOUNTER — Encounter: Payer: Self-pay | Admitting: Emergency Medicine

## 2023-07-16 ENCOUNTER — Other Ambulatory Visit: Payer: Self-pay

## 2023-07-16 DIAGNOSIS — Z992 Dependence on renal dialysis: Secondary | ICD-10-CM | POA: Insufficient documentation

## 2023-07-16 DIAGNOSIS — M7981 Nontraumatic hematoma of soft tissue: Secondary | ICD-10-CM | POA: Insufficient documentation

## 2023-07-16 DIAGNOSIS — E875 Hyperkalemia: Secondary | ICD-10-CM | POA: Insufficient documentation

## 2023-07-16 DIAGNOSIS — T148XXA Other injury of unspecified body region, initial encounter: Secondary | ICD-10-CM

## 2023-07-16 DIAGNOSIS — N186 End stage renal disease: Secondary | ICD-10-CM | POA: Insufficient documentation

## 2023-07-16 DIAGNOSIS — M7989 Other specified soft tissue disorders: Secondary | ICD-10-CM | POA: Diagnosis present

## 2023-07-16 LAB — BASIC METABOLIC PANEL
Anion gap: 14 (ref 5–15)
BUN: 21 mg/dL (ref 8–23)
CO2: 26 mmol/L (ref 22–32)
Calcium: 8.7 mg/dL — ABNORMAL LOW (ref 8.9–10.3)
Chloride: 99 mmol/L (ref 98–111)
Creatinine, Ser: 6.22 mg/dL — ABNORMAL HIGH (ref 0.44–1.00)
GFR, Estimated: 7 mL/min — ABNORMAL LOW (ref >60)
Glucose, Bld: 132 mg/dL — ABNORMAL HIGH (ref 70–99)
Potassium: 5.4 mmol/L — ABNORMAL HIGH (ref 3.5–5.1)
Sodium: 139 mmol/L (ref 135–145)

## 2023-07-16 LAB — CBC WITH DIFFERENTIAL/PLATELET
Abs Immature Granulocytes: 0.03 10*3/uL (ref 0.00–0.07)
Basophils Absolute: 0 10*3/uL (ref 0.0–0.1)
Basophils Relative: 0 %
Eosinophils Absolute: 0.2 10*3/uL (ref 0.0–0.5)
Eosinophils Relative: 3 %
HCT: 34.1 % — ABNORMAL LOW (ref 36.0–46.0)
Hemoglobin: 10.5 g/dL — ABNORMAL LOW (ref 12.0–15.0)
Immature Granulocytes: 1 %
Lymphocytes Relative: 34 %
Lymphs Abs: 1.6 10*3/uL (ref 0.7–4.0)
MCH: 30.6 pg (ref 26.0–34.0)
MCHC: 30.8 g/dL (ref 30.0–36.0)
MCV: 99.4 fL (ref 80.0–100.0)
Monocytes Absolute: 1.1 10*3/uL — ABNORMAL HIGH (ref 0.1–1.0)
Monocytes Relative: 22 %
Neutro Abs: 2 10*3/uL (ref 1.7–7.7)
Neutrophils Relative %: 40 %
Platelets: 153 10*3/uL (ref 150–400)
RBC: 3.43 MIL/uL — ABNORMAL LOW (ref 3.87–5.11)
RDW: 14.6 % (ref 11.5–15.5)
WBC: 4.9 10*3/uL (ref 4.0–10.5)
nRBC: 0 % (ref 0.0–0.2)

## 2023-07-16 MED ORDER — SODIUM ZIRCONIUM CYCLOSILICATE 10 G PO PACK: 10.0000 g | PACK | Freq: Once | ORAL | Status: DC

## 2023-07-16 MED ORDER — SODIUM ZIRCONIUM CYCLOSILICATE 10 G PO PACK
10.0000 g | PACK | ORAL | Status: AC
  Administered 2023-07-17: 10 g via ORAL
  Filled 2023-07-16: qty 1

## 2023-07-16 MED ORDER — SODIUM ZIRCONIUM CYCLOSILICATE 10 G PO PACK
10.0000 g | PACK | Freq: Once | ORAL | Status: DC
  Filled 2023-07-16: qty 1

## 2023-07-16 NOTE — ED Provider Notes (Signed)
 [facility] Provider Note    Event Date/Time   First MD Initiated Contact with Patient 07/16/23 1932     (approximate)   History   No chief complaint on file.   HPI  Jody Nelson is a 73 y.o. female with ESRD on dialysis with left arm fistula who comes in with concerns for fistula swelling.  Patient reports that she had a full session of dialysis on Friday.  She noticed some increasing swelling and pain to her fistula today so was sent over for further evaluation.  No fevers  Reviewed the note on 06/27/2023 where patient had  removal of the tunneled catheter due to working fistula.  I reviewed a note from 03/12/2023 where patient had a soft tissue abscess that was adjacent to the fistula patient had to go to the OR however on review of the discharge note it appears that it was more likely a hematoma and does not did appear there was I&D. Physical Exam   Triage Vital Signs: ED Triage Vitals  Encounter Vitals Group     BP 07/16/23 1937 (!) 105/47     Systolic BP Percentile --      Diastolic BP Percentile --      Pulse Rate 07/16/23 1937 89     Resp 07/16/23 1937 17     Temp 07/16/23 1937 98.1 F (36.7 C)     Temp Source 07/16/23 1937 Oral     SpO2 07/16/23 1937 100 %     Weight 07/16/23 1938 294 lb (133.4 kg)     Height 07/16/23 1938 5\' 4"  (1.626 m)     Head Circumference --      Peak Flow --      Pain Score 07/16/23 1938 2     Pain Loc --      Pain Education --      Exclude from Growth Chart --     Most recent vital signs: Vitals:   07/16/23 1937  BP: (!) 105/47  Pulse: 89  Resp: 17  Temp: 98.1 F (36.7 C)  SpO2: 100%     General: Awake, no distress.  CV:  Good peripheral perfusion.  Resp:  Normal effort.  Abd:  No distention.  Other:  Patient has some swelling noted near her fistula site.  She has strong pulse noted over fistula she reports some mild tenderness with palpation.  She got warm and well-perfused distal pulses good cap  refill.   ED Results / Procedures / Treatments   Labs (all labs ordered are listed, but only abnormal results are displayed) Labs Reviewed  CBC WITH DIFFERENTIAL/PLATELET  BASIC METABOLIC PANEL     EKG  My interpretation of EKG:  Normal sinus rhythm 88 without any ST elevation or T wave inversions, normal intervals  RADIOLOGY I have reviewed the [place] personally and interpretted and concern for fluid collections    PROCEDURES:  Critical Care performed: No  .Critical Care  Performed by: Concha Se, MD Authorized by: Concha Se, MD   Critical care provider statement:    Critical care time (minutes):  30   Critical care was necessary to treat or prevent imminent or life-threatening deterioration of the following conditions: hyperkalemia.   Critical care was time spent personally by me on the following activities:  Development of treatment plan with patient or surrogate, discussions with consultants, evaluation of patient's response to treatment, examination of patient, ordering and review of laboratory studies, ordering and review of radiographic  studies, ordering and performing treatments and interventions, pulse oximetry, re-evaluation of patient's condition and review of old charts    MEDICATIONS ORDERED IN ED: Medications - No data to display   IMPRESSION / MDM / ASSESSMENT AND PLAN / ED COURSE  I reviewed the triage vital signs and the nursing notes.   Patient's presentation is most consistent with acute presentation with potential threat to life or bodily function.   Patient comes in with concern for increasing pain on her right arm fistula.  There is report of a history of abscess but I reviewed the discharge summary from 03/15/2023 where vascular surgery did not drain as it was more likely a hematoma and will resolve on its own they did place a tunneled catheter and patient received dialysis through this.    IMPRESSION: 1. Two complex perigraft collections in  the upper arm with cystic and heterogeneous solid elements which appear to be medial to the fistula. These are suspected to be either complex seromas or hematomas, with infectious complication not strictly excluded. 2. The left upper extremity dialysis graft is not optimally evaluated, but appears to be patent with grossly similar flow mechanics as on the prior study.   Discussed case with Dr. Guadalupe Maple vascular surgery and after reviewing patient's prior discharge summary he suspects that this is more likely hematomas.  He stated that she could go to dialysis on Monday and if dialysis is not able to access it that she could have an outpatient tunneled catheter placed or if unable to do that go him to the emergency room at that point.  I have lower suspicion for infection given there is no warmth, no fever, no erythema.  He is also suspect these are more likely hematomas given they started developing today and her dialysis was on Friday.  Discussed with Dr.singh so he was aware of the situation.  Will give 1 dose of Lokelma given slight hyperkalemia and then will wait and give a second dose tomorrow per his suggestion.    FINAL CLINICAL IMPRESSION(S) / ED DIAGNOSES   Final diagnoses:  ESRD (end stage renal disease) (HCC)  Hematoma  Hyperkalemia     Rx / DC Orders   ED Discharge Orders     None        Note:  This document was prepared using Dragon voice recognition software and may include unintentional dictation errors.   Concha Se, MD 07/16/23 2312

## 2023-07-16 NOTE — Discharge Instructions (Addendum)
 Her potassium was slightly elevated.  We have given her a dose of Lokelma here and then she can take 1 more dose tomorrow at dinnertime to help ensure that her potassium does not go elevated before her dialysis on Monday.  She will go home with this packet for her to be taken tomorrow.  I reviewed with patient was admitted here in the hospital a few months ago does not appear that patient actually had any abscess.  It was actually hematoma that they managed conservatively with placing a catheter and got better over time.  I discussed with our vascular team today who felt like this was most likely a hematoma again from dialysis on Friday.  They recommended trying to go to dialysis on Monday and see if they can access the fistula.  If it is not working they can call the outpatient vascular office and see if they can do an outpatient tunneled catheter obviously if this are able to be done patient can be sent to the emergency room but at this time they do not want to do any emergent interventions.   IMPRESSION: 1. Two complex perigraft collections in the upper arm with cystic and heterogeneous solid elements which appear to be medial to the fistula. These are suspected to be either complex seromas or hematomas, with infectious complication not strictly excluded. 2. The left upper extremity dialysis graft is not optimally evaluated, but appears to be patent with grossly similar flow mechanics as on the prior study.

## 2023-07-16 NOTE — ED Triage Notes (Signed)
 Pt in via [facility] from [facility]; reports having dialysis yesterday, since w/ pain to left fistula since last night.  Recently replaced 2025 due to infection.  Dialysis M, W, F.  Patient reports fistula has been swollen x approximately 2 weeks, but pain just started Friday.  A/Ox4, NAD noted at this time.

## 2023-07-17 DIAGNOSIS — M7981 Nontraumatic hematoma of soft tissue: Secondary | ICD-10-CM | POA: Diagnosis not present

## 2023-07-17 NOTE — ED Notes (Signed)
 Called [facility] spoke with rep. David @11 :50 P.M. gave him pt's #SSN, insurance info, and height/weight Rep. stated he has one truck and will not be able to pick pt up until 7:30 pm tomorrow.

## 2023-07-17 NOTE — ED Notes (Signed)
 Called [facility] spoke with rep. Madelyn. Rep stated she put pt. On the list to be picked up and a truck will be here as soon as possible.

## 2023-09-19 ENCOUNTER — Encounter
Admission: EM | Disposition: A | Discharge status: Skilled Nursing Facility | Payer: Self-pay | Source: Skilled Nursing Facility | Attending: Osteopathic Medicine

## 2023-09-19 ENCOUNTER — Other Ambulatory Visit: Payer: Self-pay

## 2023-09-19 ENCOUNTER — Inpatient Hospital Stay
Admission: EM | Admit: 2023-09-19 | Discharge: 2023-09-25 | DRG: 252 | Disposition: A | Discharge status: Skilled Nursing Facility | Source: Skilled Nursing Facility | Attending: Osteopathic Medicine | Admitting: Osteopathic Medicine

## 2023-09-19 DIAGNOSIS — K219 Gastro-esophageal reflux disease without esophagitis: Secondary | ICD-10-CM | POA: Diagnosis present

## 2023-09-19 DIAGNOSIS — E669 Obesity, unspecified: Secondary | ICD-10-CM | POA: Insufficient documentation

## 2023-09-19 DIAGNOSIS — Z794 Long term (current) use of insulin: Secondary | ICD-10-CM

## 2023-09-19 DIAGNOSIS — Z882 Allergy status to sulfonamides status: Secondary | ICD-10-CM

## 2023-09-19 DIAGNOSIS — D6852 Prothrombin gene mutation: Secondary | ICD-10-CM | POA: Diagnosis present

## 2023-09-19 DIAGNOSIS — T82868A Thrombosis of vascular prosthetic devices, implants and grafts, initial encounter: Principal | ICD-10-CM | POA: Diagnosis present

## 2023-09-19 DIAGNOSIS — T82898A Other specified complication of vascular prosthetic devices, implants and grafts, initial encounter: Principal | ICD-10-CM

## 2023-09-19 DIAGNOSIS — F32A Depression, unspecified: Secondary | ICD-10-CM | POA: Diagnosis present

## 2023-09-19 DIAGNOSIS — E1122 Type 2 diabetes mellitus with diabetic chronic kidney disease: Secondary | ICD-10-CM | POA: Diagnosis present

## 2023-09-19 DIAGNOSIS — M5030 Other cervical disc degeneration, unspecified cervical region: Secondary | ICD-10-CM | POA: Diagnosis present

## 2023-09-19 DIAGNOSIS — I120 Hypertensive chronic kidney disease with stage 5 chronic kidney disease or end stage renal disease: Secondary | ICD-10-CM | POA: Diagnosis not present

## 2023-09-19 DIAGNOSIS — Z7982 Long term (current) use of aspirin: Secondary | ICD-10-CM

## 2023-09-19 DIAGNOSIS — Y832 Surgical operation with anastomosis, bypass or graft as the cause of abnormal reaction of the patient, or of later complication, without mention of misadventure at the time of the procedure: Secondary | ICD-10-CM | POA: Diagnosis present

## 2023-09-19 DIAGNOSIS — Z992 Dependence on renal dialysis: Secondary | ICD-10-CM

## 2023-09-19 DIAGNOSIS — N186 End stage renal disease: Secondary | ICD-10-CM | POA: Diagnosis not present

## 2023-09-19 DIAGNOSIS — Z79899 Other long term (current) drug therapy: Secondary | ICD-10-CM

## 2023-09-19 DIAGNOSIS — D631 Anemia in chronic kidney disease: Secondary | ICD-10-CM | POA: Diagnosis present

## 2023-09-19 DIAGNOSIS — Z8673 Personal history of transient ischemic attack (TIA), and cerebral infarction without residual deficits: Secondary | ICD-10-CM

## 2023-09-19 DIAGNOSIS — E785 Hyperlipidemia, unspecified: Secondary | ICD-10-CM | POA: Diagnosis present

## 2023-09-19 DIAGNOSIS — Z6836 Body mass index (BMI) 36.0-36.9, adult: Secondary | ICD-10-CM

## 2023-09-19 DIAGNOSIS — I2510 Atherosclerotic heart disease of native coronary artery without angina pectoris: Secondary | ICD-10-CM | POA: Diagnosis present

## 2023-09-19 DIAGNOSIS — D869 Sarcoidosis, unspecified: Secondary | ICD-10-CM | POA: Diagnosis present

## 2023-09-19 DIAGNOSIS — E875 Hyperkalemia: Secondary | ICD-10-CM | POA: Diagnosis present

## 2023-09-19 DIAGNOSIS — D638 Anemia in other chronic diseases classified elsewhere: Secondary | ICD-10-CM | POA: Diagnosis present

## 2023-09-19 DIAGNOSIS — E66812 Obesity, class 2: Secondary | ICD-10-CM | POA: Diagnosis present

## 2023-09-19 DIAGNOSIS — E1169 Type 2 diabetes mellitus with other specified complication: Secondary | ICD-10-CM | POA: Diagnosis present

## 2023-09-19 DIAGNOSIS — E871 Hypo-osmolality and hyponatremia: Secondary | ICD-10-CM | POA: Insufficient documentation

## 2023-09-19 DIAGNOSIS — Z7901 Long term (current) use of anticoagulants: Secondary | ICD-10-CM

## 2023-09-19 DIAGNOSIS — Z87891 Personal history of nicotine dependence: Secondary | ICD-10-CM

## 2023-09-19 DIAGNOSIS — I132 Hypertensive heart and chronic kidney disease with heart failure and with stage 5 chronic kidney disease, or end stage renal disease: Secondary | ICD-10-CM | POA: Diagnosis present

## 2023-09-19 DIAGNOSIS — E1151 Type 2 diabetes mellitus with diabetic peripheral angiopathy without gangrene: Secondary | ICD-10-CM | POA: Diagnosis present

## 2023-09-19 DIAGNOSIS — I69322 Dysarthria following cerebral infarction: Secondary | ICD-10-CM

## 2023-09-19 DIAGNOSIS — Z7985 Long-term (current) use of injectable non-insulin antidiabetic drugs: Secondary | ICD-10-CM

## 2023-09-19 DIAGNOSIS — N2581 Secondary hyperparathyroidism of renal origin: Secondary | ICD-10-CM | POA: Diagnosis present

## 2023-09-19 DIAGNOSIS — I5032 Chronic diastolic (congestive) heart failure: Secondary | ICD-10-CM | POA: Diagnosis present

## 2023-09-19 DIAGNOSIS — Z832 Family history of diseases of the blood and blood-forming organs and certain disorders involving the immune mechanism: Secondary | ICD-10-CM

## 2023-09-19 DIAGNOSIS — T829XXS Unspecified complication of cardiac and vascular prosthetic device, implant and graft, sequela: Secondary | ICD-10-CM

## 2023-09-19 DIAGNOSIS — T829XXA Unspecified complication of cardiac and vascular prosthetic device, implant and graft, initial encounter: Secondary | ICD-10-CM

## 2023-09-19 DIAGNOSIS — D6851 Activated protein C resistance: Secondary | ICD-10-CM | POA: Diagnosis present

## 2023-09-19 DIAGNOSIS — G4733 Obstructive sleep apnea (adult) (pediatric): Secondary | ICD-10-CM | POA: Diagnosis present

## 2023-09-19 DIAGNOSIS — D696 Thrombocytopenia, unspecified: Secondary | ICD-10-CM | POA: Insufficient documentation

## 2023-09-19 DIAGNOSIS — Z9181 History of falling: Secondary | ICD-10-CM

## 2023-09-19 DIAGNOSIS — Z800 Family history of malignant neoplasm of digestive organs: Secondary | ICD-10-CM

## 2023-09-19 DIAGNOSIS — Z833 Family history of diabetes mellitus: Secondary | ICD-10-CM

## 2023-09-19 HISTORY — PX: TEMPORARY DIALYSIS CATHETER: CATH118312

## 2023-09-19 LAB — BASIC METABOLIC PANEL WITH GFR
Anion gap: 9 (ref 5–15)
BUN: 31 mg/dL — ABNORMAL HIGH (ref 8–23)
CO2: 23 mmol/L (ref 22–32)
Calcium: 9.1 mg/dL (ref 8.9–10.3)
Chloride: 107 mmol/L (ref 98–111)
Creatinine, Ser: 8.32 mg/dL — ABNORMAL HIGH (ref 0.44–1.00)
GFR, Estimated: 5 mL/min — ABNORMAL LOW (ref >60)
Glucose, Bld: 89 mg/dL (ref 70–99)
Potassium: 6.5 mmol/L (ref 3.5–5.1)
Sodium: 139 mmol/L (ref 135–145)

## 2023-09-19 LAB — CBC
HCT: 31.6 % — ABNORMAL LOW (ref 36.0–46.0)
Hemoglobin: 9.4 g/dL — ABNORMAL LOW (ref 12.0–15.0)
MCH: 30.1 pg (ref 26.0–34.0)
MCHC: 29.7 g/dL — ABNORMAL LOW (ref 30.0–36.0)
MCV: 101.3 fL — ABNORMAL HIGH (ref 80.0–100.0)
Platelets: 115 10*3/uL — ABNORMAL LOW (ref 150–400)
RBC: 3.12 MIL/uL — ABNORMAL LOW (ref 3.87–5.11)
RDW: 15.7 % — ABNORMAL HIGH (ref 11.5–15.5)
WBC: 5.4 10*3/uL (ref 4.0–10.5)
nRBC: 0 % (ref 0.0–0.2)

## 2023-09-19 LAB — GLUCOSE, CAPILLARY: Glucose-Capillary: 117 mg/dL — ABNORMAL HIGH (ref 70–99)

## 2023-09-19 SURGERY — TEMPORARY DIALYSIS CATHETER
Anesthesia: LOCAL

## 2023-09-19 MED ORDER — SENNOSIDES-DOCUSATE SODIUM 8.6-50 MG PO TABS
1.0000 | ORAL_TABLET | Freq: Every evening | ORAL | Status: DC | PRN
  Administered 2023-09-21 – 2023-09-22 (×2): 1 via ORAL
  Filled 2023-09-19 (×2): qty 1

## 2023-09-19 MED ORDER — CHLORHEXIDINE GLUCONATE CLOTH 2 % EX PADS
6.0000 | MEDICATED_PAD | Freq: Every day | CUTANEOUS | Status: DC
  Administered 2023-09-20 – 2023-09-24 (×5): 6 via TOPICAL

## 2023-09-19 MED ORDER — LIDOCAINE-EPINEPHRINE (PF) 1 %-1:200000 IJ SOLN
INTRAMUSCULAR | Status: DC | PRN
  Administered 2023-09-19: 10 mL

## 2023-09-19 MED ORDER — CEFAZOLIN SODIUM-DEXTROSE 1-4 GM/50ML-% IV SOLN
INTRAVENOUS | Status: AC
  Filled 2023-09-19: qty 50

## 2023-09-19 MED ORDER — ALTEPLASE 2 MG IJ SOLR: 2.0000 mg | Freq: Once | INTRAMUSCULAR | Status: DC | PRN

## 2023-09-19 MED ORDER — CEFAZOLIN SODIUM-DEXTROSE 1-4 GM/50ML-% IV SOLN
1.0000 g | INTRAVENOUS | Status: DC
  Filled 2023-09-19: qty 50

## 2023-09-19 MED ORDER — HEPARIN SODIUM (PORCINE) 1000 UNIT/ML DIALYSIS: 1000.0000 [IU] | INTRAMUSCULAR | Status: DC | PRN

## 2023-09-19 MED ORDER — ACETAMINOPHEN 325 MG PO TABS
650.0000 mg | ORAL_TABLET | Freq: Four times a day (QID) | ORAL | Status: DC | PRN
  Administered 2023-09-22 – 2023-09-24 (×2): 650 mg via ORAL
  Filled 2023-09-19 (×2): qty 2

## 2023-09-19 MED ORDER — ACETAMINOPHEN 650 MG RE SUPP
650.0000 mg | Freq: Four times a day (QID) | RECTAL | Status: DC | PRN
  Administered 2023-09-23: 650 mg via RECTAL
  Filled 2023-09-19 (×2): qty 1

## 2023-09-19 MED ORDER — HEPARIN SODIUM (PORCINE) 1000 UNIT/ML IJ SOLN
INTRAMUSCULAR | Status: AC
  Filled 2023-09-19: qty 10

## 2023-09-19 MED ORDER — ONDANSETRON HCL 4 MG PO TABS
4.0000 mg | ORAL_TABLET | Freq: Four times a day (QID) | ORAL | Status: DC | PRN
Start: 2023-09-19 — End: 2023-09-25

## 2023-09-19 MED ORDER — INSULIN ASPART 100 UNIT/ML IJ SOLN
0.0000 [IU] | Freq: Three times a day (TID) | INTRAMUSCULAR | Status: DC
  Administered 2023-09-20: 1 [IU] via SUBCUTANEOUS
  Administered 2023-09-21: 2 [IU] via SUBCUTANEOUS
  Administered 2023-09-24 (×2): 1 [IU] via SUBCUTANEOUS
  Filled 2023-09-19 (×4): qty 1

## 2023-09-19 MED ORDER — ONDANSETRON HCL 4 MG/2ML IJ SOLN: 4.0000 mg | Freq: Four times a day (QID) | INTRAMUSCULAR | Status: DC | PRN

## 2023-09-19 MED ORDER — INSULIN ASPART 100 UNIT/ML IJ SOLN: 0.0000 [IU] | Freq: Every day | INTRAMUSCULAR | Status: DC

## 2023-09-19 MED ORDER — HYDROCODONE-ACETAMINOPHEN 5-325 MG PO TABS
1.0000 | ORAL_TABLET | Freq: Once | ORAL | Status: AC
  Administered 2023-09-19: 1 via ORAL
  Filled 2023-09-19 (×2): qty 1

## 2023-09-19 SURGICAL SUPPLY — 1 items: KIT DIALYSIS CATH TRI 30X13 (CATHETERS) IMPLANT

## 2023-09-19 NOTE — Progress Notes (Addendum)
 Hemodialysis note  Received patient in bed to unit. Alert and oriented.  Informed consent signed and in chart.  Tx duration: 2 hours and 33 mins. Patient refused to finish her HD today and signed the waiver. Dr. Zelda Hickman was notified.  Patient tolerated well. Transported back to room, alert without acute distress.  Report given to patient's RN.   Access used: Right femoral HD catheter Access issues: high arterial pressure  Total UF removed: 0 Medication(s) given:  Norco Vicodin tab PO  Post HD weight: unable to obtain. bedd scale is not working    [facility]

## 2023-09-19 NOTE — ED Provider Notes (Signed)
   [facility] Provider Note    Event Date/Time   First MD Initiated Contact with Patient 09/19/23 1258     (approximate)   History   Vascular Access Problem   HPI  Minami Ancelet is a 73 y.o. female with end-stage renal disease with left AV graft fistula sent from dialysis for access issues.     Physical Exam   Triage Vital Signs: ED Triage Vitals  Encounter Vitals Group     BP 09/19/23 1253 127/61     Systolic BP Percentile --      Diastolic BP Percentile --      Pulse Rate 09/19/23 1253 86     Resp 09/19/23 1253 20     Temp 09/19/23 1253 98.2 F (36.8 C)     Temp Source 09/19/23 1253 Oral     SpO2 09/19/23 1253 100 %     Weight 09/19/23 1250 95.7 kg (211 lb)     Height 09/19/23 1250 1.626 m (5\' 4" )     Head Circumference --      Peak Flow --      Pain Score 09/19/23 1250 0     Pain Loc --      Pain Education --      Exclude from Growth Chart --     Most recent vital signs: Vitals:   09/19/23 1253  BP: 127/61  Pulse: 86  Resp: 20  Temp: 98.2 F (36.8 C)  SpO2: 100%     General: Awake, no distress.  CV:  Good peripheral perfusion.  Resp:  Normal effort.  Abd:  No distention.  Other:  Left AV graft fistula, no palpable thrill or pulse   ED Results / Procedures / Treatments   Labs (all labs ordered are listed, but only abnormal results are displayed) Labs Reviewed  CBC  BASIC METABOLIC PANEL WITH GFR     EKG     RADIOLOGY     PROCEDURES:  Critical Care performed:   Procedures   MEDICATIONS ORDERED IN ED: Medications - No data to display   IMPRESSION / MDM / ASSESSMENT AND PLAN / ED COURSE  I reviewed the triage vital signs and the nursing notes. Patient's presentation is most consistent with exacerbation of chronic illness.  Patient presents with vascular access issue, unable to obtain dialysis today.  Will check chemistries, will consult with vascular surgery  Discussed with Dr. Vonna Guardian of vascular  surgery, patient is n.p.o. he will likely take her to vascular lab today  Patient is difficult to obtain labs from, lab team is attempting to obtain labs      FINAL CLINICAL IMPRESSION(S) / ED DIAGNOSES   Final diagnoses:  Arteriovenous fistula occlusion, initial encounter [facility])     Rx / DC Orders   ED Discharge Orders     None        Note:  This document was prepared using Dragon voice recognition software and may include unintentional dictation errors.   Bryson Carbine, MD 09/19/23 1400

## 2023-09-19 NOTE — Op Note (Signed)
  OPERATIVE NOTE   PROCEDURE: Ultrasound guidance for vascular access right femoral vein Placement of a 30 cm triple lumen dialysis catheter right femoral vein  PRE-OPERATIVE DIAGNOSIS: 1. ESRD, hyperkalemia precluding declotting of her occluded AV access left arm  POST-OPERATIVE DIAGNOSIS: Same  SURGEON: Mikki Alexander, MD  ASSISTANT(S): None  ANESTHESIA: local  ESTIMATED BLOOD LOSS: Minimal   FINDING(S): 1.  None  SPECIMEN(S):  None  INDICATIONS:    Patient is a 73 y.o.female who presents with hyperkalemia and a clotted AV access. The hyperkalemia precluded declot of the arm access. Risks and benefits were discussed, and informed consent was obtained..  DESCRIPTION: After obtaining full informed written consent, the patient was laid flat in the bed.  The right groin was sterilely prepped and draped in a sterile surgical field was created. The right femoral vein was visualized with ultrasound and found to be widely patent. It was then accessed under direct guidance without difficulty with a Seldinger needle and a permanent image was recorded. A J-wire was then placed. After skin nick and dilatation, a 30 cm triple lumen dialysis catheter was placed over the wire and the wire was removed. The lumens withdrew dark red nonpulsatile blood and flushed easily with sterile saline. The catheter was secured to the skin with 3 nylon sutures. Sterile dressing was placed.  COMPLICATIONS: None  CONDITION: Stable  Mikki Alexander 09/19/2023 3:51 PM  This note was created with Dragon Medical transcription system. Any errors in dictation are purely unintentional.

## 2023-09-19 NOTE — ED Triage Notes (Addendum)
 Pt comes via EMS from [facility]. Pt has dialysis on Wed and went Saturday but not sure if it took per facility. Pt might have problem with port on left side.  VSS  Pt denies any sob.

## 2023-09-19 NOTE — Progress Notes (Signed)
 Received consult for this patient who arrived for vascular surgery procedure.  Potassium found to be 6.5.  Appreciate vascular placing HD temp cath, will perform dialysis to improve hyperkalemia tonight.  Formal consult in AM.

## 2023-09-19 NOTE — Consult Note (Addendum)
 [facility] Admission History & Physical  MRN : [id number]  Jody Nelson is a 73 y.o. ([birth year]) female who presents with chief complaint of  Chief Complaint  Patient presents with   Vascular Access Problem  .  History of Present Illness: I am asked to evaluate the patient by the dialysis center. The patient was sent here because they were unable to cannulate the access this morning. Furthermore the Center states there is no thrill or bruit. The patient states this is the first dialysis run to be missed. This problem is acute in onset and has been present for approximately 2 days. The patient is unaware of any other change.   Patient denies pain or tenderness overlying the access.  There is no pain with dialysis.  The patient denies hand pain or finger pain consistent with steal syndrome.    There have been past interventions or declots of this access.  The patient is not chronically hypotensive on dialysis.  Current Facility-Administered Medications  Medication Dose Route Frequency Provider Last Rate Last Admin   ceFAZolin  (ANCEF ) IVPB 1 g/50 mL premix  1 g Intravenous 30 min Pre-Op  Espiridion Supinski, Donald Frost, MD        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   At high risk for falls    Atypical chest pain    Bilateral lower extremity edema    Chronic right sided weakness    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   a.) MRI brain 02/27/2011: old lacunar infarct in RIGHT pons and medulla oblongata   Cryptogenic stroke (HCC) 10/11/2012    a.) CT brain 10/11/2012: tiny old lacunar infarct in head of caudate nucleus (left)   Cryptogenic stroke (HCC) 10/12/2012   a.) MRI brain 10/12/2012: multiple acute LEFT hemispheric infarcts   Cryptogenic stroke (HCC) 06/26/2014   a.) MRI brain 06/26/2014: acute RIGHT posterior frontal lobe infarct involving both and grey matter   Cryptogenic stroke (HCC) 07/29/2015   a.) MRI brain 07/29/2015: 2 foci of diffusion restriction in the LEFT parietal lobe consistent with acute infarction   Cryptogenic stroke (HCC) 07/24/2017   a.) MRI brain 07/24/2017: punctate focus of acute ischemia in the medial LEFT temporal lobe   DDD (degenerative disc disease), cervical    Depression    DOE (dyspnea on exertion)    Dysarthria as late effect of stroke    ESRD (end stage renal disease) on dialysis (HCC)    a.) M-W-F   GERD (gastroesophageal reflux disease)    Hallux valgus, bilateral    Heterozygous factor V Leiden mutation (HCC) 07/18/2018   History of cardiac catheterization 08/30/2008   a.) LHC 08/30/2008: EF >60%, LVEDP 18 mmHg, normal coronaries.   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   a.) 07/18/2018 --> 22 mol/L   Hypertension    Insomnia  a.) melatonin + trazodone  PRN   Long term current use of anticoagulant    a.) apixaban    Lymphedema of both lower extremities    Meralgia paresthetica, left lower limb    OSA (obstructive sleep apnea)    a.) does not require nocturnal PAP therapy   Osteoarthritis    Pneumonia    Pseudophakia of both eyes    Sarcoidosis    Secondary hyperparathyroidism of renal origin (HCC)    Type 2 diabetes mellitus treated with insulin  (HCC)    Vitamin D deficiency     Past Surgical History:  Procedure  Laterality Date   A/V FISTULAGRAM Left 12/28/2022   Procedure: A/V Fistulagram;  Surgeon: Jackquelyn Mass, MD;  Location: [facility];  Service: Cardiovascular;  Laterality: Left;   APPLICATION OF WOUND VAC Right 06/15/2022   Procedure: APPLICATION OF WOUND VAC;  Surgeon: Lorri Rota, MD;  Location: [facility];  Service: Orthopedics;  Laterality: Right;   AV FISTULA PLACEMENT Left 04/16/2022   Procedure: INSERTION OF ARTERIOVENOUS (AV) GORE-TEX GRAFT ARM (BRACHIAL AXILLARY);  Surgeon: Jackquelyn Mass, MD;  Location: [facility];  Service: Vascular;  Laterality: Left;   CESAREAN SECTION     DIALYSIS/PERMA CATHETER INSERTION N/A 10/14/2021   Procedure: DIALYSIS/PERMA CATHETER INSERTION;  Surgeon: Celso College, MD;  Location: [facility];  Service: Cardiovascular;  Laterality: N/A;   DIALYSIS/PERMA CATHETER INSERTION N/A 03/14/2023   Procedure: DIALYSIS/PERMA CATHETER INSERTION;  Surgeon: Celso College, MD;  Location: [facility];  Service: Cardiovascular;  Laterality: N/A;   DIALYSIS/PERMA CATHETER REMOVAL N/A 08/02/2022   Procedure: DIALYSIS/PERMA CATHETER REMOVAL;  Surgeon: Celso College, MD;  Location: [facility];  Service: Cardiovascular;  Laterality: N/A;   DIALYSIS/PERMA CATHETER REMOVAL N/A 06/27/2023   Procedure: DIALYSIS/PERMA CATHETER REMOVAL;  Surgeon: Celso College, MD;  Location: [facility];  Service: Cardiovascular;  Laterality: N/A;   FRACTURE SURGERY     leg right fracture several surgeries, rods   INCISION AND DRAINAGE OF WOUND Right 06/15/2022   Procedure: IRRIGATION AND DEBRIDEMENT WOUND;  Surgeon: Lorri Rota, MD;  Location: [facility];  Service: Orthopedics;  Laterality: Right;     Social History   Tobacco Use   Smoking status: Former    Types: Cigarettes   Smokeless tobacco: Never  Substance Use Topics   Alcohol use: Never   Drug use: Never     Family History  Problem Relation Age of Onset   Diabetes Mother    Colon cancer  Sister    Diabetes Sister    Cancer Maternal Uncle    Clotting disorder Paternal Grandmother    Colon cancer Paternal Grandfather     No family history of bleeding or clotting disorders, autoimmune disease or porphyria  Allergies  Allergen Reactions   Sulfamethoxazole-Trimethoprim     Other reaction(s): Kidney Disorder Other reaction(s): Kidney Disorder Hyperkalemia, AKI Hyperkalemia, AKI      REVIEW OF SYSTEMS (Negative unless checked)  Constitutional: [] Weight loss  [] Fever  [] Chills Cardiac: [] Chest pain   [] Chest pressure   [] Palpitations   [] Shortness of breath when laying flat   [] Shortness of breath at rest   [x] Shortness of breath with exertion. Vascular:  [] Pain in legs with walking   [] Pain in legs at rest   [] Pain in legs when laying flat   [] Claudication   [] Pain in feet when walking  [] Pain in feet at rest  [] Pain in feet when laying flat   []   History of DVT   [] Phlebitis   [] Swelling in legs   [] Varicose veins   [] Non-healing ulcers Pulmonary:   [] Uses home oxygen   [] Productive cough   [] Hemoptysis   [] Wheeze  [] COPD   [] Asthma Neurologic:  [] Dizziness  [] Blackouts   [] Seizures   [x] History of stroke   [] History of TIA  [] Aphasia   [] Temporary blindness   [] Dysphagia   [x] Weakness or numbness in arms   [x] Weakness or numbness in legs Musculoskeletal:  [] Arthritis   [] Joint swelling   [x] Joint pain   [] Low back pain Hematologic:  [] Easy bruising  [] Easy bleeding   [] Hypercoagulable state   [x] Anemic  [] Hepatitis Gastrointestinal:  [] Blood in stool   [] Vomiting blood  [x] Gastroesophageal reflux/heartburn   [] Difficulty swallowing. Genitourinary:  [x] Chronic kidney disease   [] Difficult urination  [] Frequent urination  [] Burning with urination   [] Blood in urine Skin:  [] Rashes   [] Ulcers   [] Wounds Psychological:  [] History of anxiety   []  History of major depression.  Physical Examination  Vitals:   09/19/23 1250 09/19/23 1253 09/19/23 1512  BP:  127/61   Pulse:  86    Resp:  20   Temp:  98.2 F (36.8 C) 97.9 F (36.6 C)  TempSrc:  Oral Oral  SpO2:  100%   Weight: 95.7 kg    Height: 5\' 4"  (1.626 m)     Body mass index is 36.22 kg/m. Gen: WD/WN, NAD Head: [place]/AT, No temporalis wasting.  Ear/Nose/Throat: Hearing grossly intact, nares w/o erythema or drainage, oropharynx w/o Erythema/Exudate,  Eyes: Conjunctiva clear, sclera non-icteric Neck: Trachea midline.  No JVD.  Pulmonary:  Good air movement, respirations not labored, no use of accessory muscles.  Cardiac: RRR, normal S1, S2. Vascular: no thrill or bruit in left arm access Vessel Right Left  Radial Palpable Palpable   Musculoskeletal:  Extremities without ischemic changes.  No deformity or atrophy. Mild LE swelling Neurologic: Sensation grossly intact in extremities.   Speech is limited Psychiatric: Judgment intact, Mood & affect appropriate for pt's clinical situation. Dermatologic: No rashes or ulcers noted.  No cellulitis or open wounds. Lymph : No Cervical, Axillary, or Inguinal lymphadenopathy.   CBC Lab Results  Component Value Date   WBC 5.4 09/19/2023   HGB 9.4 (L) 09/19/2023   HCT 31.6 (L) 09/19/2023   MCV 101.3 (H) 09/19/2023   PLT 115 (L) 09/19/2023    BMET    Component Value Date/Time   NA 139 09/19/2023 1407   K 6.5 (HH) 09/19/2023 1407   CL 107 09/19/2023 1407   CO2 23 09/19/2023 1407   GLUCOSE 89 09/19/2023 1407   BUN 31 (H) 09/19/2023 1407   CREATININE 8.32 (H) 09/19/2023 1407   CALCIUM  9.1 09/19/2023 1407   GFRNONAA 5 (L) 09/19/2023 1407   Estimated Creatinine Clearance: 7 mL/min (A) (by C-G formula based on SCr of 8.32 mg/dL (H)).  COAG Lab Results  Component Value Date   INR 1.6 (H) 03/13/2023   INR 1.2 07/22/2022   INR 1.8 (H) 03/02/2022    Radiology No results found.  Assessment/Plan 1.  Complication dialysis device with thrombosis AV access:  Patient's dialysis access is thrombosed. The patient will undergo thrombectomy using  interventional techniques.  The risks and benefits were described to the patient.  All questions were answered.  The patient agrees to proceed with angiography and intervention. Potassium will be drawn to ensure that it is an appropriate level prior to performing thrombectomy. 2.  End-stage renal disease requiring hemodialysis:  Patient will continue dialysis therapy without further interruption if a successful thrombectomy is not achieved then catheter will be placed. Dialysis has already been arranged since the patient missed their previous session 3.  Hypertension:  Patient will continue medical management; nephrology is following no changes in oral medications.     Mikki Alexander, MD  09/19/2023 3:22 PM  ADDENDUM: Her K was too high for thrombectomy, so a temporary dialysis catheter will need to be placed today and admitted to hospitalists for dialysis.  A declot can be done in the next couple of days.

## 2023-09-19 NOTE — H&P (Addendum)
 History and Physical  Jody Nelson WUJ:[id number] DOB: [birth year] DOA: 09/19/2023  PCP: Patient, No Pcp Per   Chief Complaint: Hyperkalemia  HPI: Jody Nelson is a 73 y.o. female with medical history significant for HFpEF, ESRD on HD MWF, DDD, depression, HTN, DDD, OSA, CAD, obesity, GERD, T2DM and CVA who presented to the ED for evaluation of vascular access problem.  Patient reports she went to HD on Friday and they were unable to dialyze her due to clotting of her LUE AVF.  She returned on Saturday and they were able to complete dialysis.  During her next HD session this morning, they were unable to cannulate the access site and there was no thrill or bruit.  Patient was sent to the ER for declotting of her AVF by vascular surgery. Patient was sent directly to the Cath Lab for thrombectomy however after the BMP was obtained, patient was found to be hyperkalemic with potassium of 6.5.  Nephrology was consulted for emergent dialysis and TRH was consulted for admission.  A temporary HD catheter was placed in the right femoral vein.  During my evaluation, patient has no acute concerns and denies any chest pain, shortness of breath, fevers, chills or left hand pain.  ED Course: Initial vitals stable with SBP in the 120s.  Labs significant for Hgb 9.4, WBC 5.4, sodium 139, K+ 6.5, BUN/creatinine 31/8.32.   Review of Systems: Please see HPI for pertinent positives and negatives. A complete 10 system review of systems are otherwise negative.  Past Medical History:  Diagnosis Date   (HFpEF) heart failure with preserved ejection fraction (HCC)    a.) TTE 10/12/2012: EF >55%, triv PR, G1DD; b.) TTE 10/18/2012: EF >55%, LVH, LAE, triv MT/TR/PR; c.) TTE 06/27/2014: EF >55%, mild MAC, G1DD; d.) TTE 03/18/2017: EF 60-65%, LAE, AoV sclerosis, G1DD; e.) TTE 07/13/2016: EF >55%, LVH, triv MR/TR; f.) TTE 01/17/2019: EF >55%, LVH, GLS -18.1%, triv PR   Anemia of chronic renal failure    Anticardiolipin antibody  positive 07/18/2018   At high risk for falls    Atypical chest pain    Bilateral lower extremity edema    Chronic right sided weakness    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   a.) MRI brain 02/27/2011: old lacunar infarct in RIGHT pons and medulla oblongata   Cryptogenic stroke (HCC) 10/11/2012   a.) CT brain 10/11/2012: tiny old lacunar infarct in head of caudate nucleus (left)   Cryptogenic stroke (HCC) 10/12/2012   a.) MRI brain 10/12/2012: multiple acute LEFT hemispheric infarcts   Cryptogenic stroke (HCC) 06/26/2014   a.) MRI brain 06/26/2014: acute RIGHT posterior frontal lobe infarct involving both and grey matter   Cryptogenic stroke (HCC) 07/29/2015   a.) MRI brain 07/29/2015: 2 foci of diffusion restriction in the LEFT parietal lobe consistent with acute infarction   Cryptogenic stroke (HCC) 07/24/2017   a.) MRI brain 07/24/2017: punctate focus of acute ischemia in the medial LEFT temporal lobe   DDD (degenerative disc disease), cervical    Depression    DOE (dyspnea on exertion)    Dysarthria as late effect of stroke    ESRD (end stage renal disease) on dialysis (HCC)    a.) M-W-F   GERD (gastroesophageal reflux disease)    Hallux valgus, bilateral    Heterozygous factor V Leiden mutation (HCC) 07/18/2018   History of cardiac catheterization 08/30/2008   a.) LHC 08/30/2008: EF >60%, LVEDP 18 mmHg, normal coronaries.   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   a.) 07/18/2018 --> 22 mol/L   Hypertension    Insomnia    a.) melatonin + trazodone  PRN   Long term current use of anticoagulant    a.) apixaban    Lymphedema of both lower extremities    Meralgia paresthetica, left lower limb    OSA (obstructive sleep  apnea)    a.) does not require nocturnal PAP therapy   Osteoarthritis    Pneumonia    Pseudophakia of both eyes    Sarcoidosis    Secondary hyperparathyroidism of renal origin (HCC)    Type 2 diabetes mellitus treated with insulin  (HCC)    Vitamin D deficiency    Past Surgical History:  Procedure Laterality Date   A/V FISTULAGRAM Left 12/28/2022   Procedure: A/V Fistulagram;  Surgeon: Jackquelyn Mass, MD;  Location: [facility];  Service: Cardiovascular;  Laterality: Left;   APPLICATION OF WOUND VAC Right 06/15/2022   Procedure: APPLICATION OF WOUND VAC;  Surgeon: Lorri Rota, MD;  Location: [facility];  Service: Orthopedics;  Laterality: Right;   AV FISTULA PLACEMENT Left 04/16/2022   Procedure: INSERTION OF ARTERIOVENOUS (AV) GORE-TEX GRAFT ARM (BRACHIAL AXILLARY);  Surgeon: Jackquelyn Mass, MD;  Location: [facility];  Service: Vascular;  Laterality: Left;   CESAREAN SECTION     DIALYSIS/PERMA CATHETER INSERTION N/A 10/14/2021   Procedure: DIALYSIS/PERMA CATHETER INSERTION;  Surgeon: Celso College, MD;  Location: [facility];  Service: Cardiovascular;  Laterality: N/A;   DIALYSIS/PERMA CATHETER INSERTION N/A 03/14/2023   Procedure: DIALYSIS/PERMA CATHETER INSERTION;  Surgeon: Celso College, MD;  Location: [facility];  Service: Cardiovascular;  Laterality: N/A;   DIALYSIS/PERMA CATHETER REMOVAL N/A 08/02/2022   Procedure: DIALYSIS/PERMA CATHETER REMOVAL;  Surgeon: Celso College, MD;  Location: [facility];  Service: Cardiovascular;  Laterality: N/A;   DIALYSIS/PERMA CATHETER REMOVAL N/A 06/27/2023   Procedure: DIALYSIS/PERMA CATHETER REMOVAL;  Surgeon: Celso College, MD;  Location: [facility];  Service: Cardiovascular;  Laterality: N/A;   FRACTURE SURGERY     leg right fracture several surgeries, rods   INCISION AND DRAINAGE OF WOUND Right 06/15/2022   Procedure: IRRIGATION AND DEBRIDEMENT WOUND;  Surgeon: Lorri Rota, MD;  Location: [facility];   Service: Orthopedics;  Laterality: Right;   Social History:  reports that she has quit smoking. Her smoking use included cigarettes. She has never used smokeless tobacco. She reports that she does not drink alcohol and does not use drugs.  Allergies  Allergen Reactions   Sulfamethoxazole-Trimethoprim     Other reaction(s): Kidney Disorder Other reaction(s): Kidney Disorder Hyperkalemia, AKI Hyperkalemia, AKI     Family History  Problem Relation Age of Onset   Diabetes Mother    Colon cancer Sister    Diabetes Sister    Cancer Maternal Uncle    Clotting disorder Paternal Grandmother    Colon cancer Paternal Grandfather      Prior to Admission medications   Medication Sig Start Date End Date Taking? Authorizing Provider  acetaminophen  (TYLENOL ) 325 MG tablet Take 2 tablets by mouth every 8 (eight) hours as needed.    [provider]  albuterol  (PROVENTIL ) (2.5 MG/3ML) 0.083% nebulizer solution Take 2.5 mg by nebulization every 6 (six) hours as needed for wheezing or shortness of breath. 10/21/22  [provider]  albuterol  (VENTOLIN  HFA) 108 (90 Base) MCG/ACT inhaler Inhale 1-2 puffs into the lungs every 4 (four) hours as needed for wheezing or shortness of breath.    [provider]  apixaban  (ELIQUIS ) 5 MG TABS tablet Take 1 tablet (5 mg total) by mouth 2 (two) times daily. 07/27/22   Brenna Cam, MD  aspirin  81 MG chewable tablet Chew 81 mg by mouth daily.    [provider]  atorvastatin  (LIPITOR ) 80 MG tablet Take 80 mg by mouth daily. 02/19/21   [provider]  calcitRIOL  (ROCALTROL ) 0.25 MCG capsule Take 0.25 mcg by mouth daily. 10/22/21   [provider]  calcium  acetate (PHOSLO ) 667 MG capsule Take one capsule by mouth twice daily with food on Mondays, Wednesdays, and Fridays. Take one capsule 3 times daily on Tuesdays, Thursdays, Saturdays, and Sundays. 06/10/22   [provider]  Calcium  Carb-Cholecalciferol  (OYSTER SHELL CALCIUM  W/D) 500-5 MG-MCG TABS Take 1 tablet by mouth every Tuesday, Thursday, Saturday, and Sunday. 02/19/21   [provider]  carvedilol  (COREG ) 25 MG tablet Take 1 tablet by mouth 2 (two) times daily with a meal. 02/19/21   [provider]  diclofenac Sodium (VOLTAREN) 1 % GEL Apply 2 g topically every 4 (four) hours as needed (pain). Apply bilateral hips    [provider]  docusate sodium  (COLACE) 100 MG capsule Take 100 mg by mouth 2 (two) times daily.    [provider]  ferrous sulfate  324 (65 Fe) MG TBEC Take 1 tablet by mouth daily. 02/19/21   [provider]  fluticasone  (FLONASE ) 50 MCG/ACT nasal spray Place 1 spray into both nostrils daily. 10/18/22   [provider]  folic acid  (FOLVITE ) 1 MG tablet Take 1 mg by mouth daily. 02/19/21   [provider]  HYDROcodone -acetaminophen  (NORCO/VICODIN) 5-325 MG tablet Take 1 tablet by mouth every 6 (six) hours as needed for moderate pain.    [provider]  insulin  glargine (LANTUS ) 100 UNIT/ML Solostar Pen Inject 10 Units into the skin at bedtime. 02/17/21   [provider]  insulin  lispro (HUMALOG) 100 UNIT/ML injection Inject 2-12 Units into the skin in the morning, at noon, and at bedtime. Per sliding scale    [provider]  latanoprost  (XALATAN ) 0.005 % ophthalmic solution Place 1 drop into both eyes at bedtime. 02/08/22   [provider]  lidocaine -prilocaine  (EMLA ) cream Apply 1 application  topically 3 (three) times a week. On mon, wed, and friday    [provider]  loratadine (CLARITIN) 10 MG tablet Take 10 mg by mouth daily.    [provider]  magnesium  hydroxide (MILK OF MAGNESIA) 400 MG/5ML suspension Take 30 mLs by mouth daily as needed for mild constipation.    [provider]  montelukast  (SINGULAIR ) 10 MG tablet Take 10 mg by mouth daily. 02/19/21   [provider]  Multiple  Vitamins-Minerals (HEALTHY EYES SUPERVISION 2) CAPS Take 1 capsule by mouth 2 (two) times daily.    [provider]  omeprazole (PRILOSEC) 10 MG capsule Take 10 mg by mouth daily. 02/25/22   [provider]  OZEMPIC, 1 MG/DOSE, 4 MG/3ML SOPN Inject 1 mg into the skin every Friday. 02/18/23   [provider]  polyethylene glycol (MIRALAX  / GLYCOLAX ) 17 g packet Take 17 g by mouth daily.    [provider]  pregabalin  (LYRICA ) 25 MG capsule Take 25 mg by mouth daily.    [provider]  torsemide  (DEMADEX ) 10 MG tablet Take 10 mg by mouth daily. 10/22/21   [provider]    Physical Exam: BP (!) 112/50 (BP Location: Right Leg)   Pulse 85   Temp 98.1 F (36.7 C) (Oral)   Resp 20   Ht 5\' 4"  (1.626 m)   Wt 95.7 kg   SpO2 90%   BMI 36.22 kg/m  General: Pleasant, well-appearing elderly woman laying in bed. No acute distress. HEENT: [place]/AT. Anicteric sclera CV: RRR. No murmurs, rubs, or gallops. 1-2+ BLE edema.  Pulmonary: Lungs CTAB. Normal effort. Bibasilar rales. Diminished lung sounds throughout. Abdominal: Soft, nontender, nondistended. Normal bowel sounds. Extremities: LUE AVF w/ no palpable thrill. R groin HD catheter stable in place. Skin: Warm and dry. No obvious rash or lesions. Neuro: A&Ox3. Moves all extremities. Normal sensation to light touch. No focal deficit. Psych: Normal mood and affect          Labs on Admission:  Basic Metabolic Panel: Recent Labs  Lab 09/19/23 1407  NA 139  K 6.5*  CL 107  CO2 23  GLUCOSE 89  BUN 31*  CREATININE 8.32*  CALCIUM  9.1   Liver Function Tests: No results for input(s): "AST", "ALT", "ALKPHOS", "BILITOT", "PROT", "ALBUMIN " in the last 168 hours. No results for input(s): "LIPASE", "AMYLASE" in the last 168 hours. No results for input(s): "AMMONIA" in the last 168 hours. CBC: Recent Labs  Lab 09/19/23 1407  WBC 5.4  HGB 9.4*  HCT 31.6*  MCV 101.3*  PLT 115*   Cardiac  Enzymes: No results for input(s): "CKTOTAL", "CKMB", "CKMBINDEX", "TROPONINI" in the last 168 hours. BNP (last 3 results) No results for input(s): "BNP" in the last 8760 hours.  ProBNP (last 3 results) No results for input(s): "PROBNP" in the last 8760 hours.  CBG: No results for input(s): "GLUCAP" in the last 168 hours.  Radiological Exams on Admission: PERIPHERAL VASCULAR CATHETERIZATION Result Date: 09/19/2023 See surgical note for result.  Assessment/Plan Danyeal Gamel is a 73 y.o. female with medical history significant for HFpEF, ESRD on HD MWF, depression, HTN, DDD, OSA, CAD, obesity, GERD, T2DM and CVA who presented to the ED for evaluation of vascular access problem and found to have hyperkalemia.  # Hyperkalemia # ESRD on HD MWF - Missed HD due to inability to cannulized access site - BMP prior to declotting showed potassium of 6.5. - Nephrology consulted for HD after placement of temporary HD catheter in the right groin by VVS - Emergent HD tonight - Check EKG - Trend renal function  # Vascular access problem - LUE AVF thrombosed and unable to cannulized by HD center - Vascular surgery following for declotting once hyperkalemia has been treated  # T2DM - Last A1c 7.3% 6 months ago - SSI with meals, CBG monitoring - Follow-up repeat A1c  # Class II obesity Body mass index is 36.22 kg/m. Filed Weights   09/19/23 1250  Weight: 95.7 kg  - Follow-up with PCP in the outpatient for nutrition and weight loss counseling  # HFpEF # HLD # HTN # GERD # Degenerative disc disease - Patient's med rec still pending.  Please resume home meds once this is completed.  DVT prophylaxis: Heparin     Code Status: Full Code  Consults called: Vascular surgery, nephrology  Family Communication: No family at bedside  Severity of Illness: The appropriate patient status for this patient is OBSERVATION. Observation status is judged to be reasonable and necessary in order to  provide  the required intensity of service to ensure the patient's safety. The patient's presenting symptoms, physical exam findings, and initial radiographic and laboratory data in the context of their medical condition is felt to place them at decreased risk for further clinical deterioration. Furthermore, it is anticipated that the patient will be medically stable for discharge from the hospital within 2 midnights of admission.   Level of care: Telemetry Medical   This record has been created using Conservation officer, historic buildings. Errors have been sought and corrected, but may not always be located. Such creation errors do not reflect on the standard of care.   Vita Grip, MD 09/19/2023, 9:04 PM [facility] Pager: [phone] Isaiah 41:10   If 7PM-7AM, please contact night-coverage [email or web address] Password [email or web address]

## 2023-09-20 ENCOUNTER — Encounter: Payer: Self-pay | Admitting: Vascular Surgery

## 2023-09-20 DIAGNOSIS — N2581 Secondary hyperparathyroidism of renal origin: Secondary | ICD-10-CM | POA: Diagnosis present

## 2023-09-20 DIAGNOSIS — D6852 Prothrombin gene mutation: Secondary | ICD-10-CM | POA: Diagnosis present

## 2023-09-20 DIAGNOSIS — E785 Hyperlipidemia, unspecified: Secondary | ICD-10-CM

## 2023-09-20 DIAGNOSIS — E669 Obesity, unspecified: Secondary | ICD-10-CM | POA: Insufficient documentation

## 2023-09-20 DIAGNOSIS — Z8673 Personal history of transient ischemic attack (TIA), and cerebral infarction without residual deficits: Secondary | ICD-10-CM

## 2023-09-20 DIAGNOSIS — E871 Hypo-osmolality and hyponatremia: Secondary | ICD-10-CM | POA: Insufficient documentation

## 2023-09-20 DIAGNOSIS — Z794 Long term (current) use of insulin: Secondary | ICD-10-CM | POA: Diagnosis not present

## 2023-09-20 DIAGNOSIS — T829XXA Unspecified complication of cardiac and vascular prosthetic device, implant and graft, initial encounter: Secondary | ICD-10-CM

## 2023-09-20 DIAGNOSIS — I69322 Dysarthria following cerebral infarction: Secondary | ICD-10-CM | POA: Diagnosis not present

## 2023-09-20 DIAGNOSIS — E1169 Type 2 diabetes mellitus with other specified complication: Secondary | ICD-10-CM

## 2023-09-20 DIAGNOSIS — E66812 Obesity, class 2: Secondary | ICD-10-CM | POA: Diagnosis present

## 2023-09-20 DIAGNOSIS — T829XXS Unspecified complication of cardiac and vascular prosthetic device, implant and graft, sequela: Secondary | ICD-10-CM | POA: Diagnosis not present

## 2023-09-20 DIAGNOSIS — D869 Sarcoidosis, unspecified: Secondary | ICD-10-CM | POA: Diagnosis present

## 2023-09-20 DIAGNOSIS — T82898A Other specified complication of vascular prosthetic devices, implants and grafts, initial encounter: Secondary | ICD-10-CM | POA: Diagnosis present

## 2023-09-20 DIAGNOSIS — F32A Depression, unspecified: Secondary | ICD-10-CM | POA: Diagnosis present

## 2023-09-20 DIAGNOSIS — T82858A Stenosis of vascular prosthetic devices, implants and grafts, initial encounter: Secondary | ICD-10-CM | POA: Diagnosis not present

## 2023-09-20 DIAGNOSIS — Z992 Dependence on renal dialysis: Secondary | ICD-10-CM | POA: Diagnosis not present

## 2023-09-20 DIAGNOSIS — D696 Thrombocytopenia, unspecified: Secondary | ICD-10-CM | POA: Insufficient documentation

## 2023-09-20 DIAGNOSIS — I5032 Chronic diastolic (congestive) heart failure: Secondary | ICD-10-CM | POA: Diagnosis present

## 2023-09-20 DIAGNOSIS — E875 Hyperkalemia: Secondary | ICD-10-CM | POA: Diagnosis present

## 2023-09-20 DIAGNOSIS — D631 Anemia in chronic kidney disease: Secondary | ICD-10-CM | POA: Diagnosis present

## 2023-09-20 DIAGNOSIS — E1122 Type 2 diabetes mellitus with diabetic chronic kidney disease: Secondary | ICD-10-CM | POA: Diagnosis present

## 2023-09-20 DIAGNOSIS — K219 Gastro-esophageal reflux disease without esophagitis: Secondary | ICD-10-CM | POA: Diagnosis present

## 2023-09-20 DIAGNOSIS — Z6836 Body mass index (BMI) 36.0-36.9, adult: Secondary | ICD-10-CM | POA: Diagnosis not present

## 2023-09-20 DIAGNOSIS — I132 Hypertensive heart and chronic kidney disease with heart failure and with stage 5 chronic kidney disease, or end stage renal disease: Secondary | ICD-10-CM | POA: Diagnosis present

## 2023-09-20 DIAGNOSIS — T82868A Thrombosis of vascular prosthetic devices, implants and grafts, initial encounter: Secondary | ICD-10-CM | POA: Diagnosis present

## 2023-09-20 DIAGNOSIS — Y832 Surgical operation with anastomosis, bypass or graft as the cause of abnormal reaction of the patient, or of later complication, without mention of misadventure at the time of the procedure: Secondary | ICD-10-CM | POA: Diagnosis present

## 2023-09-20 DIAGNOSIS — D638 Anemia in other chronic diseases classified elsewhere: Secondary | ICD-10-CM

## 2023-09-20 DIAGNOSIS — E1151 Type 2 diabetes mellitus with diabetic peripheral angiopathy without gangrene: Secondary | ICD-10-CM | POA: Diagnosis present

## 2023-09-20 DIAGNOSIS — N186 End stage renal disease: Secondary | ICD-10-CM | POA: Diagnosis present

## 2023-09-20 DIAGNOSIS — I2510 Atherosclerotic heart disease of native coronary artery without angina pectoris: Secondary | ICD-10-CM | POA: Diagnosis present

## 2023-09-20 DIAGNOSIS — D6851 Activated protein C resistance: Secondary | ICD-10-CM | POA: Diagnosis present

## 2023-09-20 LAB — GLUCOSE, CAPILLARY
Glucose-Capillary: 85 mg/dL (ref 70–99)
Glucose-Capillary: 160 mg/dL — ABNORMAL HIGH (ref 70–99)
Glucose-Capillary: 62 mg/dL — ABNORMAL LOW (ref 70–99)
Glucose-Capillary: 77 mg/dL (ref 70–99)
Glucose-Capillary: 103 mg/dL — ABNORMAL HIGH (ref 70–99)
Glucose-Capillary: 120 mg/dL — ABNORMAL HIGH (ref 70–99)

## 2023-09-20 LAB — RENAL FUNCTION PANEL
Albumin: 2.5 g/dL — ABNORMAL LOW (ref 3.5–5.0)
Anion gap: 7 (ref 5–15)
BUN: 20 mg/dL (ref 8–23)
CO2: 26 mmol/L (ref 22–32)
Calcium: 8.1 mg/dL — ABNORMAL LOW (ref 8.9–10.3)
Chloride: 99 mmol/L (ref 98–111)
Creatinine, Ser: 5.9 mg/dL — ABNORMAL HIGH (ref 0.44–1.00)
GFR, Estimated: 7 mL/min — ABNORMAL LOW (ref >60)
Glucose, Bld: 91 mg/dL (ref 70–99)
Phosphorus: 3.5 mg/dL (ref 2.5–4.6)
Potassium: 3.8 mmol/L (ref 3.5–5.1)
Sodium: 133 mmol/L — ABNORMAL LOW (ref 135–145)

## 2023-09-20 LAB — BASIC METABOLIC PANEL WITH GFR
Anion gap: 7 (ref 5–15)
BUN: 19 mg/dL (ref 8–23)
CO2: 27 mmol/L (ref 22–32)
Calcium: 8 mg/dL — ABNORMAL LOW (ref 8.9–10.3)
Chloride: 101 mmol/L (ref 98–111)
Creatinine, Ser: 5.7 mg/dL — ABNORMAL HIGH (ref 0.44–1.00)
GFR, Estimated: 7 mL/min — ABNORMAL LOW (ref >60)
Glucose, Bld: 104 mg/dL — ABNORMAL HIGH (ref 70–99)
Potassium: 3.8 mmol/L (ref 3.5–5.1)
Sodium: 135 mmol/L (ref 135–145)

## 2023-09-20 LAB — CBC
HCT: 26.5 % — ABNORMAL LOW (ref 36.0–46.0)
Hemoglobin: 8.1 g/dL — ABNORMAL LOW (ref 12.0–15.0)
MCH: 30.8 pg (ref 26.0–34.0)
MCHC: 30.6 g/dL (ref 30.0–36.0)
MCV: 100.8 fL — ABNORMAL HIGH (ref 80.0–100.0)
Platelets: 135 10*3/uL — ABNORMAL LOW (ref 150–400)
RBC: 2.63 MIL/uL — ABNORMAL LOW (ref 3.87–5.11)
RDW: 15.6 % — ABNORMAL HIGH (ref 11.5–15.5)
WBC: 4.5 10*3/uL (ref 4.0–10.5)
nRBC: 0 % (ref 0.0–0.2)

## 2023-09-20 LAB — HEPATITIS C ANTIBODY: HCV Ab: NONREACTIVE

## 2023-09-20 LAB — MAGNESIUM: Magnesium: 2.3 mg/dL (ref 1.7–2.4)

## 2023-09-20 LAB — TROPONIN I (HIGH SENSITIVITY)
Troponin I (High Sensitivity): 3 ng/L (ref <18)
Troponin I (High Sensitivity): 4 ng/L (ref <18)

## 2023-09-20 MED ORDER — ORAL CARE MOUTH RINSE: 15.0000 mL | OROMUCOSAL | Status: DC | PRN

## 2023-09-20 MED ORDER — APIXABAN 5 MG PO TABS
5.0000 mg | ORAL_TABLET | Freq: Two times a day (BID) | ORAL | Status: DC
  Administered 2023-09-20 – 2023-09-24 (×10): 5 mg via ORAL
  Filled 2023-09-20 (×10): qty 1

## 2023-09-20 MED ORDER — CALCIUM ACETATE (PHOS BINDER) 667 MG PO CAPS
667.0000 mg | ORAL_CAPSULE | Freq: Three times a day (TID) | ORAL | Status: DC
  Administered 2023-09-20 – 2023-09-24 (×9): 667 mg via ORAL
  Filled 2023-09-20 (×9): qty 1

## 2023-09-20 MED ORDER — HYDROCODONE-ACETAMINOPHEN 5-325 MG PO TABS
1.0000 | ORAL_TABLET | Freq: Four times a day (QID) | ORAL | Status: DC | PRN
  Administered 2023-09-22 – 2023-09-24 (×4): 1 via ORAL
  Filled 2023-09-20 (×4): qty 1

## 2023-09-20 MED ORDER — LATANOPROST 0.005 % OP SOLN
1.0000 [drp] | Freq: Every day | OPHTHALMIC | Status: DC
  Administered 2023-09-20 – 2023-09-23 (×4): 1 [drp] via OPHTHALMIC
  Filled 2023-09-20 (×2): qty 2.5

## 2023-09-20 MED ORDER — FLUTICASONE PROPIONATE 50 MCG/ACT NA SUSP
1.0000 | Freq: Every day | NASAL | Status: DC
Start: 2023-09-20 — End: 2023-09-25
  Filled 2023-09-20: qty 16

## 2023-09-20 MED ORDER — FOLIC ACID 1 MG PO TABS
1.0000 mg | ORAL_TABLET | Freq: Every day | ORAL | Status: DC
  Administered 2023-09-20 – 2023-09-24 (×5): 1 mg via ORAL
  Filled 2023-09-20 (×5): qty 1

## 2023-09-20 MED ORDER — LORATADINE 10 MG PO TABS
10.0000 mg | ORAL_TABLET | Freq: Every day | ORAL | Status: DC
  Administered 2023-09-20 – 2023-09-24 (×5): 10 mg via ORAL
  Filled 2023-09-20 (×5): qty 1

## 2023-09-20 MED ORDER — LIDOCAINE-PRILOCAINE 2.5-2.5 % EX CREA
1.0000 | TOPICAL_CREAM | CUTANEOUS | Status: DC
  Filled 2023-09-20: qty 5

## 2023-09-20 MED ORDER — ATORVASTATIN CALCIUM 20 MG PO TABS
80.0000 mg | ORAL_TABLET | Freq: Every day | ORAL | Status: DC
  Administered 2023-09-20 – 2023-09-24 (×5): 80 mg via ORAL
  Filled 2023-09-20 (×5): qty 4

## 2023-09-20 MED ORDER — ASPIRIN 81 MG PO CHEW
81.0000 mg | CHEWABLE_TABLET | Freq: Every day | ORAL | Status: DC
  Administered 2023-09-20 – 2023-09-24 (×5): 81 mg via ORAL
  Filled 2023-09-20 (×5): qty 1

## 2023-09-20 MED ORDER — DICLOFENAC SODIUM 1 % EX GEL: 2.0000 g | CUTANEOUS | Status: DC | PRN

## 2023-09-20 MED ORDER — MONTELUKAST SODIUM 10 MG PO TABS
10.0000 mg | ORAL_TABLET | Freq: Every day | ORAL | Status: DC
  Administered 2023-09-20 – 2023-09-24 (×5): 10 mg via ORAL
  Filled 2023-09-20 (×5): qty 1

## 2023-09-20 MED ORDER — CALCITRIOL 0.25 MCG PO CAPS: 0.2500 ug | ORAL_CAPSULE | Freq: Every day | ORAL | Status: DC

## 2023-09-20 MED ORDER — PANTOPRAZOLE SODIUM 40 MG PO TBEC
40.0000 mg | DELAYED_RELEASE_TABLET | Freq: Every day | ORAL | Status: DC
  Administered 2023-09-20 – 2023-09-24 (×5): 40 mg via ORAL
  Filled 2023-09-20 (×5): qty 1

## 2023-09-20 MED ORDER — PREGABALIN 25 MG PO CAPS
25.0000 mg | ORAL_CAPSULE | Freq: Every day | ORAL | Status: DC
  Administered 2023-09-20 – 2023-09-24 (×4): 25 mg via ORAL
  Filled 2023-09-20 (×5): qty 1

## 2023-09-20 NOTE — Assessment & Plan Note (Signed)
 Occluded left fistula.  Case discussed with vascular surgery and procedure scheduled for Friday.  Continue aspirin  and Eliquis .

## 2023-09-20 NOTE — Assessment & Plan Note (Signed)
 Potassium improved today to 3.8.

## 2023-09-20 NOTE — Assessment & Plan Note (Signed)
 On aspirin  Plavix  and Lipitor 

## 2023-09-20 NOTE — Assessment & Plan Note (Signed)
 Platelet count has gone up and down in the past.  Check hepatitis C.

## 2023-09-20 NOTE — Plan of Care (Signed)
  Problem: Clinical Measurements: Goal: Ability to maintain clinical measurements within normal limits will improve Outcome: Progressing Goal: Will remain free from infection Outcome: Progressing Goal: Diagnostic test results will improve Outcome: Progressing Goal: Respiratory complications will improve Outcome: Progressing Goal: Cardiovascular complication will be avoided Outcome: Progressing   Problem: Activity: Goal: Risk for activity intolerance will decrease Outcome: Progressing   Problem: Nutrition: Goal: Adequate nutrition will be maintained Outcome: Progressing   Problem: Coping: Goal: Level of anxiety will decrease Outcome: Progressing   Problem: Elimination: Goal: Will not experience complications related to bowel motility Outcome: Progressing Goal: Will not experience complications related to urinary retention Outcome: Progressing   Problem: Pain Managment: Goal: General experience of comfort will improve and/or be controlled Outcome: Progressing

## 2023-09-20 NOTE — Assessment & Plan Note (Signed)
 Last hemoglobin 8.1, last ferritin 678

## 2023-09-20 NOTE — Progress Notes (Signed)
  Chaplain On-Call responded to Spiritual Care Consult Order from Redge Cancel, MD.  The request was for Advance Directives information for the patient.  Chaplain met the patient and provided the AD packet and education for her. Patient stated her understanding.  Chaplain Dean Every., [facility]

## 2023-09-20 NOTE — Progress Notes (Signed)
 Progress Note   Patient: Jody Nelson WUJ:[id number] DOB: [birth year] DOA: 09/19/2023     0 DOS: the patient was seen and examined on 09/20/2023   Brief hospital course: 73 y.o. female with medical history significant for HFpEF, ESRD on HD MWF, DDD, depression, HTN, DDD, OSA, CAD, obesity, GERD, T2DM and CVA who presented to the ED for evaluation of vascular access problem.  Patient reports she went to HD on Friday and they were unable to dialyze her due to clotting of her LUE AVF.  She returned on Saturday and they were able to complete dialysis.  During her next HD session this morning, they were unable to cannulate the access site and there was no thrill or bruit.  Patient was sent to the ER for declotting of her AVF by vascular surgery. Patient was sent directly to the Cath Lab for thrombectomy however after the BMP was obtained, patient was found to be hyperkalemic with potassium of 6.5.  Nephrology was consulted for emergent dialysis and TRH was consulted for admission.  A temporary HD catheter was placed in the right femoral vein.  During my evaluation, patient has no acute concerns and denies any chest pain, shortness of breath, fevers, chills or left hand pain.   5/13.  Vascular surgery scheduled a declot procedure for Friday.  Will continue Eliquis  and aspirin .  Continue dialysis through temporary catheter.  Today's potassium 3.8.  Assessment and Plan: * Hyperkalemia Potassium improved today to 3.8.  Complication of vascular access for dialysis Occluded left fistula.  Case discussed with vascular surgery and procedure scheduled for Friday.  Continue aspirin  and Eliquis .  ESRD (end stage renal disease) (HCC) Continue dialysis through temporary catheter.  Thrombocytopenia (HCC) Platelet count has gone up and down in the past.  Check hepatitis C.  Hyponatremia Continue with dialysis  Obesity (BMI 30-39.9) Class II with BMI 36.22  Type 2 diabetes mellitus with hyperlipidemia  (HCC) Continue Lipitor .  On sliding scale.  History of stroke On aspirin  Plavix and Lipitor   Anemia of chronic disease Last hemoglobin 8.1, last ferritin 678        Subjective: Patient coming in for fistula being clotted.  Patient feels okay.  No pain.  Had hyperkalemia yesterday so required dialysis from temporary catheter to improve potassium.  Physical Exam: Vitals:   09/19/23 2058 09/20/23 0415 09/20/23 0459 09/20/23 0846  BP: (!) 112/50 (!) 100/50 (!) 109/36 105/68  Pulse: 85 91 98 98  Resp: 20 (!) 22 16 18   Temp: 98.1 F (36.7 C) 98 F (36.7 C) 98.7 F (37.1 C) 98.2 F (36.8 C)  TempSrc: Oral Oral    SpO2: 90% 100% 100% 100%  Weight:      Height:       Physical Exam HENT:     Head: Normocephalic.  Eyes:     General: Lids are normal.  Cardiovascular:     Rate and Rhythm: Normal rate and regular rhythm.     Heart sounds: Normal heart sounds, S1 normal and S2 normal.  Pulmonary:     Breath sounds: No decreased breath sounds, wheezing, rhonchi or rales.  Abdominal:     Palpations: Abdomen is soft.     Tenderness: There is no abdominal tenderness.  Musculoskeletal:     Right lower leg: Swelling present.     Left lower leg: Swelling present.  Skin:    General: Skin is warm.     Findings: No rash.  Neurological:     Mental Status: She is  alert.     Data Reviewed: Hemoglobin 8.1, white blood cell count 4.5, platelet count 135, sodium 133, creatinine 5.9  Family Communication: Updated patient's daughter on the phone  Disposition: Status is: Observation Potassium improved with dialysis through temporary catheter.  Declot procedure scheduled for Friday with vascular surgery.  Planned Discharge Destination: Long-term care    Time spent: 28 minutes  Author: Verla Glaze, MD 09/20/2023 1:16 PM  For on call review [email or web address].

## 2023-09-20 NOTE — Assessment & Plan Note (Signed)
 Continue Lipitor .  On sliding scale.

## 2023-09-20 NOTE — Assessment & Plan Note (Signed)
 Continue dialysis through temporary catheter.

## 2023-09-20 NOTE — Progress Notes (Signed)
 [facility] [place], [place] 09/20/23  Subjective:   Hospital day # 0 Patient is an established ESRD patient who was sent to the emergency room for clotted left AV graft.  Upon presentation to the ER on 09/19/2023 she was found to have elevated potassium of 6.5.  Vascular surgery was consulted and a right femoral dialysis catheter was placed.  Patient was emergently dialyzed and potassium was corrected.  This morning potassium level is 3.8. This morning she feels well.  Able to eat without nausea or vomiting.  Denies any acute shortness of breath.     Objective:  Vital signs in last 24 hours:  Temp:  [98 F (36.7 C)-98.7 F (37.1 C)] (P) 98.6 F (37 C) (05/13 1949) Pulse Rate:  [84-98] 88 (05/13 2000) Resp:  [16-22] (P) 16 (05/13 1949) BP: (100-127)/(36-74) (P) 129/44 (05/13 1949) SpO2:  [100 %] 100 % (05/13 2000)  Weight change:  Filed Weights   09/19/23 1250  Weight: 95.7 kg    Intake/Output:   No intake or output data in the 24 hours ending 09/20/23 2126   Physical Exam: General: Laying in the bed, no acute distress  HEENT Moist oral mucous membranes, anicteric sclera  Pulm/lungs Normal breathing effort on room air  CVS/Heart Regular, no rub or gallop  Abdomen:  Soft, obese, nontender  Extremities: No significant peripheral edema  Neurologic: Alert, oriented, answering questions appropriately  Skin: Warm, dry  Access: Left upper arm AV graft, no bruit or thrill   Femoral temporary dialysis catheter     Basic Metabolic Panel:  Recent Labs  Lab 09/19/23 1407 09/20/23 0028 09/20/23 0222  NA 139 135 133*  K 6.5* 3.8 3.8  CL 107 101 99  CO2 23 27 26   GLUCOSE 89 104* 91  BUN 31* 19 20  CREATININE 8.32* 5.70* 5.90*  CALCIUM  9.1 8.0* 8.1*  MG  --   --  2.3  PHOS  --   --  3.5     CBC: Recent Labs  Lab 09/19/23 1407 09/20/23 0222  WBC 5.4 4.5  HGB 9.4* 8.1*  HCT 31.6* 26.5*  MCV 101.3* 100.8*  PLT 115* 135*      Lab Results   Component Value Date   HEPBSAG NON REACTIVE 03/14/2023      Microbiology:  No results found for this or any previous visit (from the past 240 hours).  Coagulation Studies: No results for input(s): "LABPROT", "INR" in the last 72 hours.  Urinalysis: No results for input(s): "COLORURINE", "LABSPEC", "PHURINE", "GLUCOSEU", "HGBUR", "BILIRUBINUR", "KETONESUR", "PROTEINUR", "UROBILINOGEN", "NITRITE", "LEUKOCYTESUR" in the last 72 hours.  Invalid input(s): "APPERANCEUR"    Imaging: PERIPHERAL VASCULAR CATHETERIZATION Result Date: 09/19/2023 See surgical note for result.    Medications:     ceFAZolin  (ANCEF ) IV      apixaban   5 mg Oral BID   aspirin   81 mg Oral Daily   atorvastatin   80 mg Oral Daily   calcium  acetate  667 mg Oral TID WC   Chlorhexidine  Gluconate Cloth  6 each Topical Q0600   fluticasone   1 spray Each Nare Daily   folic acid   1 mg Oral Daily   insulin  aspart  0-5 Units Subcutaneous QHS   insulin  aspart  0-6 Units Subcutaneous TID WC   latanoprost   1 drop Both Eyes QHS   [START ON 09/21/2023] lidocaine -prilocaine   1 Application Topical Once per day on Monday Wednesday Friday   loratadine  10 mg Oral Daily   montelukast   10  mg Oral Daily   pantoprazole   40 mg Oral Daily   pregabalin   25 mg Oral Daily   acetaminophen  **OR** acetaminophen , diclofenac Sodium, HYDROcodone -acetaminophen , ondansetron  **OR** ondansetron  (ZOFRAN ) IV, mouth rinse, senna-docusate  Assessment/ Plan:  73 y.o. female with  heart failure with preserved ejection fraction, end-stage renal disease, degenerative joint disease, depression, hypertension, sleep apnea, coronary disease, obesity, GERD, diabetes type 2, history of stroke   admitted on 09/19/2023 for Arteriovenous fistula occlusion, initial encounter (HCC) [H84.696E] Hyperkalemia [E87.5] Complication of vascular access for dialysis, sequela [T82.9XXS]  1.  Hyperkalemia in the setting of ESRD Potassium was elevated at 6.5 at the  time of admission.  Patient underwent emergent dialysis and potassium is now corrected.  We discussed low potassium diet.  She was drinking orange juice at home.  Advised patient not to drink orange juice.  Avoid potatoes, fries, chips. Next hemodialysis planned for Wednesday.  2.  Complication of dialysis access. Patient's AV graft is clotted.  She will undergo angiogram tomorrow.  3.  Anemia and chronic kidney disease Hemoglobin 8.1 on 09/20/2023. We will continue ESA with dialysis.  4.  Secondary hyperparathyroidism Continue calcium  acetate with meals    LOS: 0 Clorine Swing 5/13/20259:26 PM  [facility] [place], [place] [phone]  Note: This note was prepared with Dragon dictation. Any transcription errors are unintentional

## 2023-09-20 NOTE — Hospital Course (Addendum)
 Hospital course / significant events:   HPI: 73 y.o. female with medical history significant for HFpEF, ESRD on HD MWF, DDD, depression, HTN, DDD, OSA, CAD, obesity, GERD, T2DM and CVA who presented to the ED for evaluation of vascular access problem.  Patient reports she went to HD on Friday and they were unable to dialyze her due to clotting of her LUE AVF.  She returned on Saturday and they were able to complete dialysis.  During her next HD session this morning, they were unable to cannulate the access site and there was no thrill or bruit.  Patient was sent to the ER for declotting of her AVF by vascular surgery.  05/12: Patient was sent to the Cath Lab for thrombectomy however after the BMP was obtained, patient was found to be hyperkalemic with potassium of 6.5.  Nephrology was consulted for emergent dialysis and TRH was consulted for admission.  A temporary HD catheter was placed in the right femoral vein.   05/13:  Vascular surgery scheduled a declot procedure for Friday 05/16.  Will continue Eliquis  and aspirin .  Continue dialysis through temporary catheter.  Today's potassium 3.8. 05/14-05/15: stable, awaiting vascular procedure 05/16: underwent LUE AV graft cannulation and thrombolysis/thrombectomy, balloon angioplasty - resolution of the thrombus with only the residual stenosis in the mid graft improved w/ ballon to only about 10% residual stenosis w/ good flow thru the graft. Dialysis this evening. Facility states can take her back over the weekend, if doing well anticipate d/c tomorrow  05/17: ***     Consultants:  Vascular surgery Nephrology   Procedures/Surgeries: 09/19/23: placement triple lumen dialysis catheter right femoral vein       ASSESSMENT & PLAN:   Hyperkalemia Potassium improved  Monitor BMP Dialysis per nephrology   Complication of vascular access for dialysis: Occluded left fistula.   S/p LUE AV graft cannulation and thrombolysis/thrombectomy, balloon  angioplasty - resolution of the thrombus and good blood flow thru graft  Continue aspirin  and Eliquis .   ESRD (end stage renal disease)  Continue dialysis through temporary catheter.   Thrombocytopenia  Platelet count has gone up and down in the past.   Check hepatitis C Monitor CBC   Hyponatremia, chronic  Continue with dialysis   Type 2 diabetes mellitus  SSI Hyperlipidemia Lipitor .     History of stroke On aspirin  Plavix and Lipitor    Anemia of chronic disease Last hemoglobin 8.1, last ferritin 678 Monitor CBC    Class 2 obesity based on BMI: Body mass index is 36.22 kg/m.[name] Significantly low or high BMI is associated with higher medical risk.  Underweight - under 18  overweight - 25 to 29 obese - 30 or more Class 1 obesity: BMI of 30.0 to 34 Class 2 obesity: BMI of 35.0 to 39 Class 3 obesity: BMI of 40.0 to 49 Super Morbid Obesity: BMI 50-59 Super-super Morbid Obesity: BMI 60+ Healthy nutrition and physical activity advised as adjunct to other disease management and risk reduction treatments    DVT prophylaxis: eliquis  IV fluids: no continuous IV fluids  Nutrition: renal Central lines / other devices: HD cath R groin  Code Status: FULL CODE ACP documentation reviewed: none on file in [facility]  TOC needs: back to long term care when able, PT/OT eval pending in case needing rehab which will require auth  Medical barriers to dispo:  Facility states can take her back over the weekend, if doing well after dialysis today and into tomorrow, anticipate d/c tomorrow

## 2023-09-20 NOTE — Assessment & Plan Note (Signed)
 Class II with BMI 36.22

## 2023-09-20 NOTE — Assessment & Plan Note (Signed)
Continue with dialysis ?

## 2023-09-20 NOTE — Progress Notes (Signed)
 Progress Note    09/20/2023 11:09 AM 1 Day Post-Op  Subjective:  Jody Nelson is a 73 y.o. female with end-stage renal disease with left AV graft fistula sent from dialysis for access issues.  Patient is resting comfortably in bed this morning.  No complaints overnight.  Temporary dialysis access is in the patient's right groin.  No complications.  Vitals all remained stable.  Review of Systems  Constitutional:  Constitutional negative. Eyes: Eyes negative.  Respiratory: Respiratory negative.  Cardiovascular: Cardiovascular negative.  GI: Gastrointestinal negative.  GU: Genitourinary negative. Musculoskeletal: Musculoskeletal negative.  Skin: Skin negative.  Neurological: Neurological negative. Psychiatric: Psychiatric negative.  All other systems reviewed and are negative   Vitals:   09/20/23 0459 09/20/23 0846  BP: (!) 109/36 105/68  Pulse: 98 98  Resp: 16 18  Temp: 98.7 F (37.1 C) 98.2 F (36.8 C)  SpO2: 100% 100%   Physical Exam: Cardiac:  RRR, normal S1-S2, no rubs clicks gallops or murmurs noted. Lungs: Normal labored breathing, no rales rhonchi or wheezing on auscultation noted. Incisions: Right femoral vein insertion of temporary dialysis catheter.  No complications to note no hematoma seroma. Extremities: Left upper extremity AV graft without pulsation or bruit. Abdomen: Morbidly obese, soft, nontender nondistended Neurologic: Alert and oriented x 3 follows commands and answers questions appropriately.  CBC    Component Value Date/Time   WBC 4.5 09/20/2023 0222   RBC 2.63 (L) 09/20/2023 0222   HGB 8.1 (L) 09/20/2023 0222   HCT 26.5 (L) 09/20/2023 0222   PLT 135 (L) 09/20/2023 0222   MCV 100.8 (H) 09/20/2023 0222   MCH 30.8 09/20/2023 0222   MCHC 30.6 09/20/2023 0222   RDW 15.6 (H) 09/20/2023 0222   LYMPHSABS 1.6 07/16/2023 2138   MONOABS 1.1 (H) 07/16/2023 2138   EOSABS 0.2 07/16/2023 2138   BASOSABS 0.0 07/16/2023 2138    BMET    Component  Value Date/Time   NA 133 (L) 09/20/2023 0222   K 3.8 09/20/2023 0222   CL 99 09/20/2023 0222   CO2 26 09/20/2023 0222   GLUCOSE 91 09/20/2023 0222   BUN 20 09/20/2023 0222   CREATININE 5.90 (H) 09/20/2023 0222   CALCIUM  8.1 (L) 09/20/2023 0222   GFRNONAA 7 (L) 09/20/2023 0222    INR    Component Value Date/Time   INR 1.6 (H) 03/13/2023 0630     Intake/Output Summary (Last 24 hours) at 09/20/2023 1109 Last data filed at 09/19/2023 2016 Gross per 24 hour  Intake --  Output 600 ml  Net -600 ml     Assessment/Plan:  73 y.o. female who presents to [facility] emergency department with clotted left upper extremity AV graft.  Patient is now postop day 1 from temporary dialysis catheter placement right femoral vein.  No complications to note.  1 Day Post-Op   PLAN Vascular surgery plans on taking the patient to the vascular lab on Friday, 09/23/2023 for left upper extremity fistulogram with possible intervention.  If unable to fix or declot her fistula patient will be placed in a dialysis permacatheter at the same time.  I discussed in detail at the bedside this morning with the patient and procedures, benefits, risk, and complications.  Patient verbalizes understanding and agrees to proceed with the procedure.  I answered all the patient's questions this morning.  Patient will be made n.p.o. after midnight the day of her procedure.   DVT prophylaxis: Eliquis  5 mg twice daily, aspirin  81 mg daily.  Lipitor  80 mg daily  Annamaria Barrette [facility] 09/20/2023 11:09 AM

## 2023-09-21 DIAGNOSIS — N186 End stage renal disease: Secondary | ICD-10-CM

## 2023-09-21 DIAGNOSIS — T82898A Other specified complication of vascular prosthetic devices, implants and grafts, initial encounter: Secondary | ICD-10-CM | POA: Diagnosis not present

## 2023-09-21 DIAGNOSIS — T829XXS Unspecified complication of cardiac and vascular prosthetic device, implant and graft, sequela: Secondary | ICD-10-CM

## 2023-09-21 DIAGNOSIS — E875 Hyperkalemia: Secondary | ICD-10-CM | POA: Diagnosis not present

## 2023-09-21 LAB — BASIC METABOLIC PANEL WITH GFR
Anion gap: 7 (ref 5–15)
BUN: 31 mg/dL — ABNORMAL HIGH (ref 8–23)
CO2: 26 mmol/L (ref 22–32)
Calcium: 8.5 mg/dL — ABNORMAL LOW (ref 8.9–10.3)
Chloride: 102 mmol/L (ref 98–111)
Creatinine, Ser: 7.65 mg/dL — ABNORMAL HIGH (ref 0.44–1.00)
GFR, Estimated: 5 mL/min — ABNORMAL LOW (ref >60)
Glucose, Bld: 85 mg/dL (ref 70–99)
Potassium: 4.5 mmol/L (ref 3.5–5.1)
Sodium: 135 mmol/L (ref 135–145)

## 2023-09-21 LAB — TYPE AND SCREEN
ABO/RH(D): A POS
Antibody Screen: NEGATIVE

## 2023-09-21 LAB — CBC
HCT: 29.7 % — ABNORMAL LOW (ref 36.0–46.0)
Hemoglobin: 9 g/dL — ABNORMAL LOW (ref 12.0–15.0)
MCH: 30.6 pg (ref 26.0–34.0)
MCHC: 30.3 g/dL (ref 30.0–36.0)
MCV: 101 fL — ABNORMAL HIGH (ref 80.0–100.0)
Platelets: 135 10*3/uL — ABNORMAL LOW (ref 150–400)
RBC: 2.94 MIL/uL — ABNORMAL LOW (ref 3.87–5.11)
RDW: 15.3 % (ref 11.5–15.5)
WBC: 4.1 10*3/uL (ref 4.0–10.5)
nRBC: 0 % (ref 0.0–0.2)

## 2023-09-21 LAB — HEMOGLOBIN A1C
Hgb A1c MFr Bld: 5 % (ref 4.8–5.6)
Mean Plasma Glucose: 97 mg/dL

## 2023-09-21 LAB — GLUCOSE, CAPILLARY
Glucose-Capillary: 97 mg/dL (ref 70–99)
Glucose-Capillary: 237 mg/dL — ABNORMAL HIGH (ref 70–99)
Glucose-Capillary: 114 mg/dL — ABNORMAL HIGH (ref 70–99)

## 2023-09-21 MED ORDER — HEPARIN SODIUM (PORCINE) 1000 UNIT/ML IJ SOLN
INTRAMUSCULAR | Status: AC
  Filled 2023-09-21: qty 10

## 2023-09-21 NOTE — Progress Notes (Addendum)
 PT Cancellation Note  Patient Details Name: Jody Nelson MRN: [id number] DOB: 1951-01-05   Cancelled Treatment:    Reason Eval/Treat Not Completed: Other (comment) Pt laying in bed in NAD, pleasant but ultimately declines PT today. She reports she was up to the recliner yesterday but had a lot of bleeding with the temp fem cath.  She reports she is scared to try to get up at this time and defers PT for today.  It appears she is scheduled for new fistula this week, she indicates she does not want to try working with PT until then. Addendum: 13:45, pt out of room for dialysis. Darice Edelman, DPT 09/21/2023, 10:27 AM

## 2023-09-21 NOTE — Progress Notes (Signed)
 Progress Note    09/21/2023 10:09 AM 2 Days Post-Op  Subjective:  Kelci Schoenbachler is a 73 y.o. female with end-stage renal disease with left AV graft fistula sent from dialysis for access issues.  Patient is resting comfortably in bed this morning.  No complaints overnight.  Temporary dialysis access is in the patient's right groin.  No complications.  Vitals all remained stable.   Review of Systems  Constitutional:  Constitutional negative. Eyes: Eyes negative.  Respiratory: Respiratory negative.  Cardiovascular: Cardiovascular negative.  GI: Gastrointestinal negative.  GU: Genitourinary negative. Musculoskeletal: Musculoskeletal negative.  Skin: Skin negative.  Neurological: Neurological negative. Psychiatric: Psychiatric negative.  All other systems reviewed and are negative   Vitals:   09/21/23 0745 09/21/23 0747  BP: (!) 126/44   Pulse: 90 92  Resp: 16   Temp: 98.4 F (36.9 C)   SpO2: (!) 77% 99%   Physical Exam: Cardiac:  RRR, normal S1-S2, no rubs clicks gallops or murmurs noted. Lungs: Normal labored breathing, no rales rhonchi or wheezing on auscultation noted. Incisions: Right femoral vein insertion of temporary dialysis catheter.  No complications to note no hematoma seroma. Extremities: Left upper extremity AV graft without pulsation or bruit. Abdomen: Morbidly obese, soft, nontender nondistended Neurologic: Alert and oriented x 3 follows commands and answers questions appropriately.    CBC    Component Value Date/Time   WBC 4.1 09/21/2023 0538   RBC 2.94 (L) 09/21/2023 0538   HGB 9.0 (L) 09/21/2023 0538   HCT 29.7 (L) 09/21/2023 0538   PLT 135 (L) 09/21/2023 0538   MCV 101.0 (H) 09/21/2023 0538   MCH 30.6 09/21/2023 0538   MCHC 30.3 09/21/2023 0538   RDW 15.3 09/21/2023 0538   LYMPHSABS 1.6 07/16/2023 2138   MONOABS 1.1 (H) 07/16/2023 2138   EOSABS 0.2 07/16/2023 2138   BASOSABS 0.0 07/16/2023 2138    BMET    Component Value Date/Time   NA 135  09/21/2023 0538   K 4.5 09/21/2023 0538   CL 102 09/21/2023 0538   CO2 26 09/21/2023 0538   GLUCOSE 85 09/21/2023 0538   BUN 31 (H) 09/21/2023 0538   CREATININE 7.65 (H) 09/21/2023 0538   CALCIUM  8.5 (L) 09/21/2023 0538   GFRNONAA 5 (L) 09/21/2023 0538    INR    Component Value Date/Time   INR 1.6 (H) 03/13/2023 0630    No intake or output data in the 24 hours ending 09/21/23 1009   Assessment/Plan:  73 y.o. female who presents to [facility] emergency department with clotted left upper extremity AV graft.  Patient is now postop day 2 from temporary dialysis catheter placement right femoral vein.  No complications to note.  2 Days Post-Op   Vascular surgery plans on taking patient to the vascular lab later today on 09/21/2023 for left upper extremity venogram with possible intervention and possible dialysis permacatheter placement.  I had a long detailed conversation with the patient at the bedside this morning with the patient regarding the procedure, benefits, risk, complications.  Patient verbalized understanding wishes to proceed.  I answered all the patient's questions this morning.  Patient did eat breakfast at 7:30 AM this morning.  Therefore procedure will need to be done later this afternoon.  I discussed the case in detail with Dr. Mikki Alexander MD and he agrees with the plan.  DVT prophylaxis: Eliquis  5 mg twice daily, aspirin  81 mg daily, and Lipitor  80 mg.   Annamaria Barrette [facility] 09/21/2023 10:09 AM

## 2023-09-21 NOTE — Progress Notes (Signed)
 Hemodialysis note  Received patient in bed to unit. Alert and oriented.  Informed consent signed and in chart.  Treatment Duration: 3.5 hours  Patient tolerated well. Transported back to room, alert without acute distress.  Report given to patient's RN.   Access used: Right Femoral HD Catheter Access issues: none  Total UF removed: 1L Medication(s) given:  none  Post HD weight: 134.6 kg   Jody Nelson [facility]

## 2023-09-21 NOTE — Progress Notes (Signed)
 [facility] [place], [place] 09/21/23  Subjective:   Hospital day # 1 Patient is an established ESRD patient who was sent to the emergency room for clotted left AV graft.  Upon presentation to the ER on 09/19/2023 she was found to have elevated potassium of 6.5.  Vascular surgery was consulted and a right femoral dialysis catheter was placed.    Patient seen sitting up in bed Alert Denies pain or discomfort.  Remains on room air   Objective:  Vital signs in last 24 hours:  Temp:  [97.7 F (36.5 C)-98.6 F (37 C)] 98.4 F (36.9 C) (05/14 0745) Pulse Rate:  [84-92] 92 (05/14 0747) Resp:  [16] 16 (05/14 0745) BP: (125-127)/(44-74) 126/44 (05/14 0745) SpO2:  [77 %-100 %] 99 % (05/14 0747)  Weight change:  Filed Weights   09/19/23 1250  Weight: 95.7 kg    Intake/Output:    Intake/Output Summary (Last 24 hours) at 09/21/2023 1205 Last data filed at 09/21/2023 1055 Gross per 24 hour  Intake 360 ml  Output --  Net 360 ml     Physical Exam: General: Laying in the bed, no acute distress  HEENT Moist oral mucous membranes, anicteric sclera  Pulm/lungs Normal breathing effort on room air  CVS/Heart Regular, no rub or gallop  Abdomen:  Soft, obese, nontender  Extremities: No significant peripheral edema  Neurologic: Alert, oriented, answering questions appropriately  Skin: Warm, dry  Access: Left upper arm AV graft, no bruit or thrill   Femoral temporary dialysis catheter     Basic Metabolic Panel:  Recent Labs  Lab 09/19/23 1407 09/20/23 0028 09/20/23 0222 09/21/23 0538  NA 139 135 133* 135  K 6.5* 3.8 3.8 4.5  CL 107 101 99 102  CO2 23 27 26 26   GLUCOSE 89 104* 91 85  BUN 31* 19 20 31*  CREATININE 8.32* 5.70* 5.90* 7.65*  CALCIUM  9.1 8.0* 8.1* 8.5*  MG  --   --  2.3  --   PHOS  --   --  3.5  --      CBC: Recent Labs  Lab 09/19/23 1407 09/20/23 0222 09/21/23 0538  WBC 5.4 4.5 4.1  HGB 9.4* 8.1* 9.0*  HCT 31.6* 26.5* 29.7*  MCV  101.3* 100.8* 101.0*  PLT 115* 135* 135*      Lab Results  Component Value Date   HEPBSAG NON REACTIVE 03/14/2023      Microbiology:  No results found for this or any previous visit (from the past 240 hours).  Coagulation Studies: No results for input(s): "LABPROT", "INR" in the last 72 hours.  Urinalysis: No results for input(s): "COLORURINE", "LABSPEC", "PHURINE", "GLUCOSEU", "HGBUR", "BILIRUBINUR", "KETONESUR", "PROTEINUR", "UROBILINOGEN", "NITRITE", "LEUKOCYTESUR" in the last 72 hours.  Invalid input(s): "APPERANCEUR"    Imaging: PERIPHERAL VASCULAR CATHETERIZATION Result Date: 09/19/2023 See surgical note for result.    Medications:     ceFAZolin  (ANCEF ) IV      apixaban   5 mg Oral BID   aspirin   81 mg Oral Daily   atorvastatin   80 mg Oral Daily   calcium  acetate  667 mg Oral TID WC   Chlorhexidine  Gluconate Cloth  6 each Topical Q0600   fluticasone   1 spray Each Nare Daily   folic acid   1 mg Oral Daily   insulin  aspart  0-5 Units Subcutaneous QHS   insulin  aspart  0-6 Units Subcutaneous TID WC   latanoprost   1 drop Both Eyes QHS   lidocaine -prilocaine   1 Application Topical Once  per day on Monday Wednesday Friday   loratadine  10 mg Oral Daily   montelukast   10 mg Oral Daily   pantoprazole   40 mg Oral Daily   pregabalin   25 mg Oral Daily   acetaminophen  **OR** acetaminophen , diclofenac Sodium, HYDROcodone -acetaminophen , ondansetron  **OR** ondansetron  (ZOFRAN ) IV, mouth rinse, senna-docusate  Assessment/ Plan:  73 y.o. female with  heart failure with preserved ejection fraction, end-stage renal disease, degenerative joint disease, depression, hypertension, sleep apnea, coronary disease, obesity, GERD, diabetes type 2, history of stroke   admitted on 09/19/2023 for Arteriovenous fistula occlusion, initial encounter (HCC) [O96.295M] Hyperkalemia [E87.5] Complication of vascular access for dialysis, sequela [T82.9XXS]  1.  Hyperkalemia in the setting of  ESRD Potassium was elevated at 6.5 at the time of admission.  Patient underwent emergent dialysis and potassium is now corrected.  We discussed low potassium diet.  She was drinking orange juice at home.  Advised patient not to drink orange juice.  Avoid potatoes, fries, chips.  Potassium 4.5 today. Will plan to dialyze patient later today.   2.  Complication of dialysis access. Patient's AV graft is clotted.  She will undergo angiogram on Friday  3.  Anemia and chronic kidney disease Hemoglobin 8.1 on 09/20/2023. We will continue ESA with dialysis.  4.  Secondary hyperparathyroidism Continue calcium  acetate with meals. Calcium  stable    LOS: 1 Anola King 5/14/202512:05 PM  [facility] [place], [place] [phone]  Note: This note was prepared with Dragon dictation. Any transcription errors are unintentional

## 2023-09-21 NOTE — Progress Notes (Signed)
 PROGRESS NOTE    Jody Nelson   ZOX:[id number] DOB: July 04, 1950  DOA: 09/19/2023 Date of Service: 09/21/23 which is hospital day 1  PCP: Patient, No Pcp Per    Hospital course / significant events:   HPI: 73 y.o. female with medical history significant for HFpEF, ESRD on HD MWF, DDD, depression, HTN, DDD, OSA, CAD, obesity, GERD, T2DM and CVA who presented to the ED for evaluation of vascular access problem.  Patient reports she went to HD on Friday and they were unable to dialyze her due to clotting of her LUE AVF.  She returned on Saturday and they were able to complete dialysis.  During her next HD session this morning, they were unable to cannulate the access site and there was no thrill or bruit.  Patient was sent to the ER for declotting of her AVF by vascular surgery.  05/12: Patient was sent to the Cath Lab for thrombectomy however after the BMP was obtained, patient was found to be hyperkalemic with potassium of 6.5.  Nephrology was consulted for emergent dialysis and TRH was consulted for admission.  A temporary HD catheter was placed in the right femoral vein.   05/13:  Vascular surgery scheduled a declot procedure for Friday 05/16.  Will continue Eliquis  and aspirin .  Continue dialysis through temporary catheter.  Today's potassium 3.8. 05/14: stable, vascular team was able to move up fistulagram to today      Consultants:  Vascular surgery Nephrology   Procedures/Surgeries: 09/19/23: placement triple lumen dialysis catheter right femoral vein       ASSESSMENT & PLAN:   Hyperkalemia Potassium improved  Monitor BMP Dialysis per nephrology   Complication of vascular access for dialysis: Occluded left fistula.   Pending LUE venogram  Continue aspirin  and Eliquis .   ESRD (end stage renal disease)  Continue dialysis through temporary catheter.   Thrombocytopenia  Platelet count has gone up and down in the past.   Check hepatitis C Monitor CBC   Hyponatremia,  chronic  Continue with dialysis   Type 2 diabetes mellitus  SSI Hyperlipidemia Lipitor .     History of stroke On aspirin  Plavix and Lipitor    Anemia of chronic disease Last hemoglobin 8.1, last ferritin 678 Monitor CBC    Class 2 obesity based on BMI: Body mass index is 36.22 kg/m.[name] Significantly low or high BMI is associated with higher medical risk.  Underweight - under 18  overweight - 25 to 29 obese - 30 or more Class 1 obesity: BMI of 30.0 to 34 Class 2 obesity: BMI of 35.0 to 39 Class 3 obesity: BMI of 40.0 to 49 Super Morbid Obesity: BMI 50-59 Super-super Morbid Obesity: BMI 60+ Healthy nutrition and physical activity advised as adjunct to other disease management and risk reduction treatments    DVT prophylaxis: eliquis  IV fluids: no continuous IV fluids  Nutrition: renal Central lines / other devices: HD cath R groin  Code Status: FULL CODE ACP documentation reviewed: none on file in [facility]  TOC needs: TBD Medical barriers to dispo: dialysis / fistula access. Expected medical readiness for discharge tomorrow              Subjective / Brief ROS:  Patient reports no concerns today  Denies CP/SOB.  Pain controlled.  Denies new weakness.  Tolerating diet but currently NPO pending procedure .  Reports no concerns w/ urination/defecation.   Family Communication: none at this time     Objective Findings:  Vitals:   09/21/23 1430 09/21/23  1500 09/21/23 1530 09/21/23 1600  BP: (!) 129/56 (!) 130/46 (!) 132/52 (!) 128/57  Pulse:  83 85 85  Resp: 12 12 13 16   Temp:      TempSrc:      SpO2: 98% 98% 98% 97%  Weight:      Height:        Intake/Output Summary (Last 24 hours) at 09/21/2023 1618 Last data filed at 09/21/2023 1055 Gross per 24 hour  Intake 360 ml  Output --  Net 360 ml   Filed Weights   09/19/23 1250 09/21/23 1334  Weight: 95.7 kg 135.7 kg    Examination:  Physical Exam Constitutional:      General: She is not in acute  distress. Cardiovascular:     Rate and Rhythm: Normal rate and regular rhythm.  Pulmonary:     Effort: Pulmonary effort is normal.     Breath sounds: Normal breath sounds.  Neurological:     Mental Status: She is alert.  Psychiatric:        Mood and Affect: Mood normal.        Behavior: Behavior normal.          Scheduled Medications:   apixaban   5 mg Oral BID   aspirin   81 mg Oral Daily   atorvastatin   80 mg Oral Daily   calcium  acetate  667 mg Oral TID WC   Chlorhexidine  Gluconate Cloth  6 each Topical Q0600   fluticasone   1 spray Each Nare Daily   folic acid   1 mg Oral Daily   insulin  aspart  0-5 Units Subcutaneous QHS   insulin  aspart  0-6 Units Subcutaneous TID WC   latanoprost   1 drop Both Eyes QHS   lidocaine -prilocaine   1 Application Topical Once per day on Monday Wednesday Friday   loratadine  10 mg Oral Daily   montelukast   10 mg Oral Daily   pantoprazole   40 mg Oral Daily   pregabalin   25 mg Oral Daily    Continuous Infusions:   ceFAZolin  (ANCEF ) IV      PRN Medications:  acetaminophen  **OR** acetaminophen , diclofenac Sodium, HYDROcodone -acetaminophen , ondansetron  **OR** ondansetron  (ZOFRAN ) IV, mouth rinse, senna-docusate  Antimicrobials from admission:  Anti-infectives (From admission, onward)    Start     Dose/Rate Route Frequency Ordered Stop   09/19/23 1600  ceFAZolin  (ANCEF ) IVPB 1 g/50 mL premix        1 g 100 mL/hr over 30 Minutes Intravenous 30 min pre-op  09/19/23 1418             Data Reviewed:  I have personally reviewed the following...  CBC: Recent Labs  Lab 09/19/23 1407 09/20/23 0222 09/21/23 0538  WBC 5.4 4.5 4.1  HGB 9.4* 8.1* 9.0*  HCT 31.6* 26.5* 29.7*  MCV 101.3* 100.8* 101.0*  PLT 115* 135* 135*   Basic Metabolic Panel: Recent Labs  Lab 09/19/23 1407 09/20/23 0028 09/20/23 0222 09/21/23 0538  NA 139 135 133* 135  K 6.5* 3.8 3.8 4.5  CL 107 101 99 102  CO2 23 27 26 26   GLUCOSE 89 104* 91 85  BUN 31*  19 20 31*  CREATININE 8.32* 5.70* 5.90* 7.65*  CALCIUM  9.1 8.0* 8.1* 8.5*  MG  --   --  2.3  --   PHOS  --   --  3.5  --    GFR: Estimated Creatinine Clearance: 9.3 mL/min (A) (by C-G formula based on SCr of 7.65 mg/dL (H)). Liver Function Tests: Recent Labs  Lab 09/20/23 0222  ALBUMIN  2.5*   No results for input(s): "LIPASE", "AMYLASE" in the last 168 hours. No results for input(s): "AMMONIA" in the last 168 hours. Coagulation Profile: No results for input(s): "INR", "PROTIME" in the last 168 hours. Cardiac Enzymes: No results for input(s): "CKTOTAL", "CKMB", "CKMBINDEX", "TROPONINI" in the last 168 hours. BNP (last 3 results) No results for input(s): "PROBNP" in the last 8760 hours. HbA1C: Recent Labs    09/20/23 0028  HGBA1C 5.0   CBG: Recent Labs  Lab 09/20/23 1739 09/20/23 1836 09/20/23 2100 09/21/23 0744 09/21/23 1128  GLUCAP 77 103* 120* 97 237*   Lipid Profile: No results for input(s): "CHOL", "HDL", "LDLCALC", "TRIG", "CHOLHDL", "LDLDIRECT" in the last 72 hours. Thyroid  Function Tests: No results for input(s): "TSH", "T4TOTAL", "FREET4", "T3FREE", "THYROIDAB" in the last 72 hours. Anemia Panel: No results for input(s): "VITAMINB12", "FOLATE", "FERRITIN", "TIBC", "IRON ", "RETICCTPCT" in the last 72 hours. Most Recent Urinalysis On File:  No results found for: "COLORURINE", "APPEARANCEUR", "LABSPEC", "PHURINE", "GLUCOSEU", "HGBUR", "BILIRUBINUR", "KETONESUR", "PROTEINUR", "UROBILINOGEN", "NITRITE", "LEUKOCYTESUR" Sepsis Labs: @LABRCNTIP (procalcitonin:4,lacticidven:4) Microbiology: No results found for this or any previous visit (from the past 240 hours).    Radiology Studies last 3 days: PERIPHERAL VASCULAR CATHETERIZATION Result Date: 09/19/2023 See surgical note for result.        Tyeler Goedken, DO [facility] 09/21/2023, 4:18 PM    Dictation software may have been used to generate the above note. Typos may occur and escape review  in typed/dictated notes. Please contact Dr Authur Leghorn directly for clarity if needed.  Staff may message me via secure chat in Epic  but this may not receive an immediate response,  please page me for urgent matters!  If 7PM-7AM, please contact night coverage [email or web address]

## 2023-09-22 DIAGNOSIS — E875 Hyperkalemia: Secondary | ICD-10-CM | POA: Diagnosis not present

## 2023-09-22 LAB — BASIC METABOLIC PANEL WITH GFR
Anion gap: 8 (ref 5–15)
BUN: 19 mg/dL (ref 8–23)
CO2: 28 mmol/L (ref 22–32)
Calcium: 8.3 mg/dL — ABNORMAL LOW (ref 8.9–10.3)
Chloride: 99 mmol/L (ref 98–111)
Creatinine, Ser: 4.84 mg/dL — ABNORMAL HIGH (ref 0.44–1.00)
GFR, Estimated: 9 mL/min — ABNORMAL LOW (ref >60)
Glucose, Bld: 143 mg/dL — ABNORMAL HIGH (ref 70–99)
Potassium: 4.1 mmol/L (ref 3.5–5.1)
Sodium: 135 mmol/L (ref 135–145)

## 2023-09-22 LAB — CBC
HCT: 26.8 % — ABNORMAL LOW (ref 36.0–46.0)
Hemoglobin: 8.5 g/dL — ABNORMAL LOW (ref 12.0–15.0)
MCH: 31.3 pg (ref 26.0–34.0)
MCHC: 31.7 g/dL (ref 30.0–36.0)
MCV: 98.5 fL (ref 80.0–100.0)
Platelets: 125 10*3/uL — ABNORMAL LOW (ref 150–400)
RBC: 2.72 MIL/uL — ABNORMAL LOW (ref 3.87–5.11)
RDW: 15.2 % (ref 11.5–15.5)
WBC: 5.8 10*3/uL (ref 4.0–10.5)
nRBC: 0 % (ref 0.0–0.2)

## 2023-09-22 LAB — GLUCOSE, CAPILLARY
Glucose-Capillary: 97 mg/dL (ref 70–99)
Glucose-Capillary: 115 mg/dL — ABNORMAL HIGH (ref 70–99)
Glucose-Capillary: 105 mg/dL — ABNORMAL HIGH (ref 70–99)
Glucose-Capillary: 130 mg/dL — ABNORMAL HIGH (ref 70–99)

## 2023-09-22 MED ORDER — LACTULOSE 10 GM/15ML PO SOLN
30.0000 g | Freq: Once | ORAL | Status: AC
  Administered 2023-09-22: 30 g via ORAL
  Filled 2023-09-22: qty 60

## 2023-09-22 NOTE — Progress Notes (Signed)
 PROGRESS NOTE    Jody Nelson   LOV:[id number] DOB: 1950-07-07  DOA: 09/19/2023 Date of Service: 09/22/23 which is hospital day 2  PCP: Patient, No Pcp Per    Hospital course / significant events:   HPI: 73 y.o. female with medical history significant for HFpEF, ESRD on HD MWF, DDD, depression, HTN, DDD, OSA, CAD, obesity, GERD, T2DM and CVA who presented to the ED for evaluation of vascular access problem.  Patient reports she went to HD on Friday and they were unable to dialyze her due to clotting of her LUE AVF.  She returned on Saturday and they were able to complete dialysis.  During her next HD session this morning, they were unable to cannulate the access site and there was no thrill or bruit.  Patient was sent to the ER for declotting of her AVF by vascular surgery.  05/12: Patient was sent to the Cath Lab for thrombectomy however after the BMP was obtained, patient was found to be hyperkalemic with potassium of 6.5.  Nephrology was consulted for emergent dialysis and TRH was consulted for admission.  A temporary HD catheter was placed in the right femoral vein.   05/13:  Vascular surgery scheduled a declot procedure for Friday 05/16.  Will continue Eliquis  and aspirin .  Continue dialysis through temporary catheter.  Today's potassium 3.8. 05/14-05/15: stable, awaiting vascular procedure     Consultants:  Vascular surgery Nephrology   Procedures/Surgeries: 09/19/23: placement triple lumen dialysis catheter right femoral vein       ASSESSMENT & PLAN:   Hyperkalemia Potassium improved  Monitor BMP Dialysis per nephrology   Complication of vascular access for dialysis: Occluded left fistula.   Pending LUE venogram  Continue aspirin  and Eliquis .   ESRD (end stage renal disease)  Continue dialysis through temporary catheter.   Thrombocytopenia  Platelet count has gone up and down in the past.   Check hepatitis C Monitor CBC   Hyponatremia, chronic  Continue with  dialysis   Type 2 diabetes mellitus  SSI Hyperlipidemia Lipitor .     History of stroke On aspirin  Plavix and Lipitor    Anemia of chronic disease Last hemoglobin 8.1, last ferritin 678 Monitor CBC    Class 2 obesity based on BMI: Body mass index is 36.22 kg/m.[name] Significantly low or high BMI is associated with higher medical risk.  Underweight - under 18  overweight - 25 to 29 obese - 30 or more Class 1 obesity: BMI of 30.0 to 34 Class 2 obesity: BMI of 35.0 to 39 Class 3 obesity: BMI of 40.0 to 49 Super Morbid Obesity: BMI 50-59 Super-super Morbid Obesity: BMI 60+ Healthy nutrition and physical activity advised as adjunct to other disease management and risk reduction treatments    DVT prophylaxis: eliquis  IV fluids: no continuous IV fluids  Nutrition: renal Central lines / other devices: HD cath R groin  Code Status: FULL CODE ACP documentation reviewed: none on file in [facility]  TOC needs: TBD, PT/OT eval pending  Medical barriers to dispo: dialysis / fistula access. Expected medical readiness for discharge tomorrow or day after              Subjective / Brief ROS:  Patient reports no concerns today  Denies CP/SOB.  Pain controlled.  Denies new weakness.   Reports no concerns w/ urination/defecation.   Family Communication: none at this time     Objective Findings:  Vitals:   09/21/23 1730 09/21/23 1932 09/22/23 0439 09/22/23 0837  BP: (!) 136/49 [name])  134/50 (!) 109/51 (!) 129/57  Pulse: 88 89 94 87  Resp: (!) 21 17 16 17   Temp: 98.3 F (36.8 C) 98 F (36.7 C) 98.6 F (37 C) 99.2 F (37.3 C)  TempSrc: Oral Oral Oral   SpO2: 96% 100% 91%   Weight: 134.6 kg     Height:        Intake/Output Summary (Last 24 hours) at 09/22/2023 1340 Last data filed at 09/21/2023 1730 Gross per 24 hour  Intake --  Output 1000 ml  Net -1000 ml   Filed Weights   09/19/23 1250 09/21/23 1334 09/21/23 1730  Weight: 95.7 kg 135.7 kg 134.6 kg     Examination:  Physical Exam Constitutional:      General: She is not in acute distress. Cardiovascular:     Rate and Rhythm: Normal rate and regular rhythm.  Pulmonary:     Effort: Pulmonary effort is normal.     Breath sounds: Normal breath sounds.  Neurological:     Mental Status: She is alert.  Psychiatric:        Mood and Affect: Mood normal.        Behavior: Behavior normal.          Scheduled Medications:   apixaban   5 mg Oral BID   aspirin   81 mg Oral Daily   atorvastatin   80 mg Oral Daily   calcium  acetate  667 mg Oral TID WC   Chlorhexidine  Gluconate Cloth  6 each Topical Q0600   fluticasone   1 spray Each Nare Daily   folic acid   1 mg Oral Daily   insulin  aspart  0-5 Units Subcutaneous QHS   insulin  aspart  0-6 Units Subcutaneous TID WC   lactulose   30 g Oral Once   latanoprost   1 drop Both Eyes QHS   lidocaine -prilocaine   1 Application Topical Once per day on Monday Wednesday Friday   loratadine  10 mg Oral Daily   montelukast   10 mg Oral Daily   pantoprazole   40 mg Oral Daily   pregabalin   25 mg Oral Daily    Continuous Infusions:   ceFAZolin  (ANCEF ) IV      PRN Medications:  acetaminophen  **OR** acetaminophen , diclofenac Sodium, HYDROcodone -acetaminophen , ondansetron  **OR** ondansetron  (ZOFRAN ) IV, mouth rinse, senna-docusate  Antimicrobials from admission:  Anti-infectives (From admission, onward)    Start     Dose/Rate Route Frequency Ordered Stop   09/19/23 1600  ceFAZolin  (ANCEF ) IVPB 1 g/50 mL premix        1 g 100 mL/hr over 30 Minutes Intravenous 30 min pre-op  09/19/23 1418             Data Reviewed:  I have personally reviewed the following...  CBC: Recent Labs  Lab 09/19/23 1407 09/20/23 0222 09/21/23 0538 09/22/23 0514  WBC 5.4 4.5 4.1 5.8  HGB 9.4* 8.1* 9.0* 8.5*  HCT 31.6* 26.5* 29.7* 26.8*  MCV 101.3* 100.8* 101.0* 98.5  PLT 115* 135* 135* 125*   Basic Metabolic Panel: Recent Labs  Lab 09/19/23 1407  09/20/23 0028 09/20/23 0222 09/21/23 0538 09/22/23 0514  NA 139 135 133* 135 135  K 6.5* 3.8 3.8 4.5 4.1  CL 107 101 99 102 99  CO2 23 27 26 26 28   GLUCOSE 89 104* 91 85 143*  BUN 31* 19 20 31* 19  CREATININE 8.32* 5.70* 5.90* 7.65* 4.84*  CALCIUM  9.1 8.0* 8.1* 8.5* 8.3*  MG  --   --  2.3  --   --  PHOS  --   --  3.5  --   --    GFR: Estimated Creatinine Clearance: 14.6 mL/min (A) (by C-G formula based on SCr of 4.84 mg/dL (H)). Liver Function Tests: Recent Labs  Lab 09/20/23 0222  ALBUMIN  2.5*   No results for input(s): "LIPASE", "AMYLASE" in the last 168 hours. No results for input(s): "AMMONIA" in the last 168 hours. Coagulation Profile: No results for input(s): "INR", "PROTIME" in the last 168 hours. Cardiac Enzymes: No results for input(s): "CKTOTAL", "CKMB", "CKMBINDEX", "TROPONINI" in the last 168 hours. BNP (last 3 results) No results for input(s): "PROBNP" in the last 8760 hours. HbA1C: Recent Labs    09/20/23 0028  HGBA1C 5.0   CBG: Recent Labs  Lab 09/21/23 0744 09/21/23 1128 09/21/23 2138 09/22/23 0839 09/22/23 1205  GLUCAP 97 237* 114* 97 115*   Lipid Profile: No results for input(s): "CHOL", "HDL", "LDLCALC", "TRIG", "CHOLHDL", "LDLDIRECT" in the last 72 hours. Thyroid  Function Tests: No results for input(s): "TSH", "T4TOTAL", "FREET4", "T3FREE", "THYROIDAB" in the last 72 hours. Anemia Panel: No results for input(s): "VITAMINB12", "FOLATE", "FERRITIN", "TIBC", "IRON ", "RETICCTPCT" in the last 72 hours. Most Recent Urinalysis On File:  No results found for: "COLORURINE", "APPEARANCEUR", "LABSPEC", "PHURINE", "GLUCOSEU", "HGBUR", "BILIRUBINUR", "KETONESUR", "PROTEINUR", "UROBILINOGEN", "NITRITE", "LEUKOCYTESUR" Sepsis Labs: @LABRCNTIP (procalcitonin:4,lacticidven:4) Microbiology: No results found for this or any previous visit (from the past 240 hours).    Radiology Studies last 3 days: PERIPHERAL VASCULAR CATHETERIZATION Result Date:  09/19/2023 See surgical note for result.        Carmeron Heady, DO [facility] 09/22/2023, 1:40 PM    Dictation software may have been used to generate the above note. Typos may occur and escape review in typed/dictated notes. Please contact Dr Authur Leghorn directly for clarity if needed.  Staff may message me via secure chat in Epic  but this may not receive an immediate response,  please page me for urgent matters!  If 7PM-7AM, please contact night coverage [email or web address]

## 2023-09-22 NOTE — Progress Notes (Signed)
 [facility] [place], [place] 09/22/23  Subjective:   Hospital day # 2 Patient is an established ESRD patient who was sent to the emergency room for clotted left AV graft.  Upon presentation to the ER on 09/19/2023 she was found to have elevated potassium of 6.5.  Vascular surgery was consulted and a right femoral dialysis catheter was placed.    Sitting up in bed Alert Breakfast tray at bedside Denies pain Denies shortness of breath   Objective:  Vital signs in last 24 hours:  Temp:  [98 F (36.7 C)-99.2 F (37.3 C)] 99.2 F (37.3 C) (05/15 0837) Pulse Rate:  [83-94] 87 (05/15 0837) Resp:  [12-21] 17 (05/15 0837) BP: (109-141)/(46-59) 129/57 (05/15 0837) SpO2:  [91 %-100 %] 91 % (05/15 0439) Weight:  [134.6 kg-135.7 kg] 134.6 kg (05/14 1730)  Weight change:  Filed Weights   09/19/23 1250 09/21/23 1334 09/21/23 1730  Weight: 95.7 kg 135.7 kg 134.6 kg    Intake/Output:    Intake/Output Summary (Last 24 hours) at 09/22/2023 1202 Last data filed at 09/21/2023 1730 Gross per 24 hour  Intake --  Output 1000 ml  Net -1000 ml     Physical Exam: General: Laying in the bed, no acute distress  HEENT Moist oral mucous membranes, anicteric sclera  Pulm/lungs Normal breathing effort on room air  CVS/Heart Regular, no rub or gallop  Abdomen:  Soft, obese, nontender  Extremities: No significant peripheral edema  Neurologic: Alert, oriented, answering questions appropriately  Skin: Warm, dry  Access: Left upper arm AV graft, no bruit or thrill   Femoral temporary dialysis catheter     Basic Metabolic Panel:  Recent Labs  Lab 09/19/23 1407 09/20/23 0028 09/20/23 0222 09/21/23 0538 09/22/23 0514  NA 139 135 133* 135 135  K 6.5* 3.8 3.8 4.5 4.1  CL 107 101 99 102 99  CO2 23 27 26 26 28   GLUCOSE 89 104* 91 85 143*  BUN 31* 19 20 31* 19  CREATININE 8.32* 5.70* 5.90* 7.65* 4.84*  CALCIUM  9.1 8.0* 8.1* 8.5* 8.3*  MG  --   --  2.3  --   --   PHOS   --   --  3.5  --   --      CBC: Recent Labs  Lab 09/19/23 1407 09/20/23 0222 09/21/23 0538 09/22/23 0514  WBC 5.4 4.5 4.1 5.8  HGB 9.4* 8.1* 9.0* 8.5*  HCT 31.6* 26.5* 29.7* 26.8*  MCV 101.3* 100.8* 101.0* 98.5  PLT 115* 135* 135* 125*      Lab Results  Component Value Date   HEPBSAG NON REACTIVE 03/14/2023      Microbiology:  No results found for this or any previous visit (from the past 240 hours).  Coagulation Studies: No results for input(s): "LABPROT", "INR" in the last 72 hours.  Urinalysis: No results for input(s): "COLORURINE", "LABSPEC", "PHURINE", "GLUCOSEU", "HGBUR", "BILIRUBINUR", "KETONESUR", "PROTEINUR", "UROBILINOGEN", "NITRITE", "LEUKOCYTESUR" in the last 72 hours.  Invalid input(s): "APPERANCEUR"    Imaging: No results found.    Medications:     ceFAZolin  (ANCEF ) IV      apixaban   5 mg Oral BID   aspirin   81 mg Oral Daily   atorvastatin   80 mg Oral Daily   calcium  acetate  667 mg Oral TID WC   Chlorhexidine  Gluconate Cloth  6 each Topical Q0600   fluticasone   1 spray Each Nare Daily   folic acid   1 mg Oral Daily   insulin  aspart  0-5 Units Subcutaneous QHS   insulin  aspart  0-6 Units Subcutaneous TID WC   latanoprost   1 drop Both Eyes QHS   lidocaine -prilocaine   1 Application Topical Once per day on Monday Wednesday Friday   loratadine  10 mg Oral Daily   montelukast   10 mg Oral Daily   pantoprazole   40 mg Oral Daily   pregabalin   25 mg Oral Daily   acetaminophen  **OR** acetaminophen , diclofenac Sodium, HYDROcodone -acetaminophen , ondansetron  **OR** ondansetron  (ZOFRAN ) IV, mouth rinse, senna-docusate  Assessment/ Plan:  73 y.o. female with  heart failure with preserved ejection fraction, end-stage renal disease, degenerative joint disease, depression, hypertension, sleep apnea, coronary disease, obesity, GERD, diabetes type 2, history of stroke   admitted on 09/19/2023 for Arteriovenous fistula occlusion, initial encounter (HCC)  [Z61.096E] Hyperkalemia [E87.5] Complication of vascular access for dialysis, sequela [T82.9XXS]  1.  Hyperkalemia in the setting of ESRD Potassium was elevated at 6.5 at the time of admission.  Patient underwent emergent dialysis and potassium is now corrected.  We discussed low potassium diet.  She was drinking orange juice at home.  Advised patient not to drink orange juice.  Avoid potatoes, fries, chips.  Potassium 4.1.  Patient received dialysis yesterday, UF 1 L achieved.  Next treatment scheduled for Friday.  2.  Complication of dialysis access. Patient's AV graft is clotted.  She will undergo angiogram on Friday  3.  Anemia and chronic kidney disease Hemoglobin 8.5 on 09/20/2023. We will continue ESA with dialysis.  4.  Secondary hyperparathyroidism Continue calcium  acetate with meals.  Calcium  acceptable at this time.    LOS: 2 Jody Nelson 5/15/202512:02 PM  [facility] [address] [place], [place] [phone]

## 2023-09-22 NOTE — Plan of Care (Signed)

## 2023-09-22 NOTE — TOC Initial Note (Signed)
 Transition of Care [facility]) - Initial/Assessment Note    Patient Details  Name: Jody Nelson MRN: [id number] Date of Birth: June 11, 1950  Transition of Care [facility]) CM/SW Contact:    Loman Risk, RN Phone Number: 09/22/2023, 12:33 PM  Clinical Narrative:                  Abran Abrahams at [facility] confirms that patient is LTC PT eval pending Plan for fistula gram tomorrow        Patient Goals and CMS Choice            Expected Discharge Plan and Services                                              Prior Living Arrangements/Services                       Activities of Daily Living   ADL Screening (condition at time of admission) Independently performs ADLs?: No Does the patient have a NEW difficulty with bathing/dressing/toileting/self-feeding that is expected to last >3 days?: No Does the patient have a NEW difficulty with getting in/out of bed, walking, or climbing stairs that is expected to last >3 days?: No Does the patient have a NEW difficulty with communication that is expected to last >3 days?: No Is the patient deaf or have difficulty hearing?: No Does the patient have difficulty seeing, even when wearing glasses/contacts?: No Does the patient have difficulty concentrating, remembering, or making decisions?: No  Permission Sought/Granted                  Emotional Assessment              Admission diagnosis:  Arteriovenous fistula occlusion, initial encounter (HCC) [T82.898A] Hyperkalemia [E87.5] Complication of vascular access for dialysis, sequela [T82.9XXS] Patient Active Problem List   Diagnosis Date Noted   Complication of vascular access for dialysis 09/20/2023   Obesity (BMI 30-39.9) 09/20/2023   Hyponatremia 09/20/2023   Thrombocytopenia (HCC) 09/20/2023   Complication of vascular access for dialysis, sequela 09/20/2023   Hyperkalemia 09/19/2023   Arteriovenous fistula occlusion (HCC) 09/19/2023   ESRD (end stage renal  disease) (HCC) 03/13/2023   Dyslipidemia 03/13/2023   Type 2 diabetes mellitus with peripheral neuropathy (HCC) 03/13/2023   Abscess of left upper extremity 03/12/2023   Slurred speech 07/23/2022   Acute CVA (cerebrovascular accident) (HCC) 07/22/2022   Multiple open wounds of lower leg 06/17/2022   Wound infection 06/14/2022   Decubitus ulcer of heel, bilateral 06/13/2022   Hypercoagulable state (positive anticardiolipin, elevated homocysteine, factor V Leiden deficiency) 06/13/2022   Osteomyelitis of right leg (HCC) 06/12/2022   Non-healing wound of right lower extremity 06/12/2022   Cellulitis and abscess of right leg 06/12/2022   Chronic anticoagulation 06/12/2022   ESRD on hemodialysis (HCC) 12/27/2021   (HFpEF) heart failure with preserved ejection fraction (HCC) 01/31/2021   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HCC) 10/02/2020   Hereditary and idiopathic neuropathy, unspecified 10/02/2020   History of falling 10/02/2020   Insomnia, unspecified 10/02/2020   Muscle weakness (generalized) 10/02/2020   Resistance to multiple antibiotics 10/02/2020   Unspecified abnormalities of gait and mobility 10/02/2020   Unspecified lack of coordination 10/02/2020   Left-sided weakness 10/02/2020   Anemia of chronic renal failure 10/02/2020   Multiple drug resistant organism (MDRO) culture positive 09/25/2020  History of stroke 06/11/2020   Uncontrolled type 2 diabetes mellitus with hyperglycemia, with long-term current use of insulin  (HCC) 06/11/2020   Urinary tract infection 06/11/2020   Heel ulcer, left, limited to breakdown of skin (HCC) 01/04/2020   Leg ulcer, left, limited to breakdown of skin (HCC) 01/04/2020   Lymphedema, not elsewhere classified 01/04/2020   Chest pain, atypical 09/10/2019   Anemia of chronic disease 09/19/2018   Cellulitis of left lower extremity 09/19/2018   Sepsis (HCC) 09/19/2018   Anticardiolipin antibody positive 08/16/2018   Elevated homocysteine 08/16/2018    Heterozygous factor V Leiden mutation (HCC) 08/16/2018   History of obstructive sleep apnea 07/18/2018   Ischemic stroke (HCC) 07/18/2018   Acute kidney failure, unspecified (HCC) 07/02/2018   Acute kidney injury superimposed on CKD (HCC) 06/29/2018   Encounter for other specified surgical aftercare 05/11/2018   Proteus (mirabilis) (morganii) as the cause of diseases classified elsewhere 05/11/2018   CKD (chronic kidney disease) stage 3, GFR 30-59 ml/min (HCC) 03/22/2018   Closed fracture of proximal end of left humerus 03/22/2018   Closed fracture of right tibial plateau with routine healing 03/22/2018   Hyperlipidemia with target LDL less than 70 07/14/2017   Morbid (severe) obesity due to excess calories (HCC) 10/20/2016   Unspecified sequelae of cerebral infarction 10/20/2016   Unspecified diastolic (congestive) heart failure (HCC) 10/20/2016   Type 2 diabetes mellitus with hyperlipidemia (HCC) 10/20/2016   Primary osteoarthritis of both knees 05/21/2016   History of influenza 05/04/2016   Dyspnea on effort 03/12/2016   GERD (gastroesophageal reflux disease) 01/27/2016   Calculus of gallbladder without cholecystitis without obstruction 10/21/2015   Pseudophakia of left eye 10/07/2015   Pseudophakia of right eye 08/26/2015   Morbid obesity with BMI of 50.0-59.9, adult (HCC) 07/29/2015   Lymphedema of both lower extremities 07/08/2015   Hallux valgus, acquired 07/08/2015   Onychomycosis due to dermatophyte 07/08/2015   At high risk for falls 12/10/2014   Essential hypertension 12/10/2014   Hypertension associated with type 2 diabetes mellitus (HCC) 12/10/2014   Vitamin D deficiency 11/21/2014   Diabetes mellitus with insulin  therapy (HCC) 10/13/2012   Sarcoidosis 01/31/2012   PCP:  Patient, No Pcp Per Pharmacy:   [facility] [place] [facility] - [place], [place] - [address] [place] [address] Phone: [phone] Fax: [phone]     Social  Drivers of Health (SDOH) Social History: SDOH Screenings   Food Insecurity: No Food Insecurity (09/19/2023)  Housing: Low Risk  (09/19/2023)  Transportation Needs: No Transportation Needs (09/19/2023)  Utilities: Not At Risk (09/19/2023)  Financial Resource Strain: Low Risk  (07/07/2023)   Received from [facility]  Physical Activity: Unknown (07/17/2017)   Received from [facility], [facility]  Social Connections: Moderately Isolated (09/19/2023)  Stress: No Stress Concern Present (11/01/2018)   Received from [facility]  Tobacco Use: Medium Risk (09/19/2023)   SDOH Interventions:     Readmission Risk Interventions     No data to display

## 2023-09-23 ENCOUNTER — Encounter
Admission: EM | Disposition: A | Discharge status: Skilled Nursing Facility | Payer: Self-pay | Source: Skilled Nursing Facility | Attending: Osteopathic Medicine

## 2023-09-23 DIAGNOSIS — N186 End stage renal disease: Secondary | ICD-10-CM | POA: Diagnosis not present

## 2023-09-23 DIAGNOSIS — T82868A Thrombosis of vascular prosthetic devices, implants and grafts, initial encounter: Secondary | ICD-10-CM | POA: Diagnosis not present

## 2023-09-23 DIAGNOSIS — T82898A Other specified complication of vascular prosthetic devices, implants and grafts, initial encounter: Secondary | ICD-10-CM | POA: Diagnosis not present

## 2023-09-23 DIAGNOSIS — T82858A Stenosis of vascular prosthetic devices, implants and grafts, initial encounter: Secondary | ICD-10-CM | POA: Diagnosis not present

## 2023-09-23 DIAGNOSIS — Z992 Dependence on renal dialysis: Secondary | ICD-10-CM | POA: Diagnosis not present

## 2023-09-23 DIAGNOSIS — T829XXS Unspecified complication of cardiac and vascular prosthetic device, implant and graft, sequela: Secondary | ICD-10-CM | POA: Diagnosis not present

## 2023-09-23 DIAGNOSIS — E875 Hyperkalemia: Secondary | ICD-10-CM | POA: Diagnosis not present

## 2023-09-23 HISTORY — PX: PERIPHERAL VASCULAR THROMBECTOMY: CATH118306

## 2023-09-23 LAB — CBC
HCT: 30.6 % — ABNORMAL LOW (ref 36.0–46.0)
Hemoglobin: 9.7 g/dL — ABNORMAL LOW (ref 12.0–15.0)
MCH: 31.2 pg (ref 26.0–34.0)
MCHC: 31.7 g/dL (ref 30.0–36.0)
MCV: 98.4 fL (ref 80.0–100.0)
Platelets: 121 10*3/uL — ABNORMAL LOW (ref 150–400)
RBC: 3.11 MIL/uL — ABNORMAL LOW (ref 3.87–5.11)
RDW: 14.6 % (ref 11.5–15.5)
WBC: 6.8 10*3/uL (ref 4.0–10.5)
nRBC: 0 % (ref 0.0–0.2)

## 2023-09-23 LAB — GLUCOSE, CAPILLARY
Glucose-Capillary: 103 mg/dL — ABNORMAL HIGH (ref 70–99)
Glucose-Capillary: 86 mg/dL (ref 70–99)
Glucose-Capillary: 86 mg/dL (ref 70–99)

## 2023-09-23 LAB — RENAL FUNCTION PANEL
Albumin: 2.5 g/dL — ABNORMAL LOW (ref 3.5–5.0)
Anion gap: 9 (ref 5–15)
BUN: 31 mg/dL — ABNORMAL HIGH (ref 8–23)
CO2: 27 mmol/L (ref 22–32)
Calcium: 8.1 mg/dL — ABNORMAL LOW (ref 8.9–10.3)
Chloride: 98 mmol/L (ref 98–111)
Creatinine, Ser: 7.12 mg/dL — ABNORMAL HIGH (ref 0.44–1.00)
GFR, Estimated: 6 mL/min — ABNORMAL LOW (ref >60)
Glucose, Bld: 164 mg/dL — ABNORMAL HIGH (ref 70–99)
Phosphorus: 5 mg/dL — ABNORMAL HIGH (ref 2.5–4.6)
Potassium: 4.6 mmol/L (ref 3.5–5.1)
Sodium: 134 mmol/L — ABNORMAL LOW (ref 135–145)

## 2023-09-23 SURGERY — PERIPHERAL VASCULAR THROMBECTOMY
Anesthesia: Moderate Sedation

## 2023-09-23 SURGERY — A/V FISTULAGRAM
Anesthesia: Moderate Sedation

## 2023-09-23 MED ORDER — HEPARIN SODIUM (PORCINE) 1000 UNIT/ML IJ SOLN
INTRAMUSCULAR | Status: DC | PRN
  Administered 2023-09-23: 4000 [IU] via INTRAVENOUS

## 2023-09-23 MED ORDER — FENTANYL CITRATE (PF) 100 MCG/2ML IJ SOLN
INTRAMUSCULAR | Status: DC | PRN
  Administered 2023-09-23: 50 ug via INTRAVENOUS
  Administered 2023-09-23: 12.5 ug via INTRAVENOUS

## 2023-09-23 MED ORDER — HEPARIN (PORCINE) IN NACL 1000-0.9 UT/500ML-% IV SOLN
INTRAVENOUS | Status: DC | PRN
  Administered 2023-09-23: 500 mL

## 2023-09-23 MED ORDER — MIDAZOLAM HCL 2 MG/2ML IJ SOLN
INTRAMUSCULAR | Status: AC
  Filled 2023-09-23: qty 2

## 2023-09-23 MED ORDER — ALTEPLASE 2 MG IJ SOLR
INTRAMUSCULAR | Status: AC
  Filled 2023-09-23: qty 4

## 2023-09-23 MED ORDER — HEPARIN SODIUM (PORCINE) 1000 UNIT/ML IJ SOLN
INTRAMUSCULAR | Status: AC
  Filled 2023-09-23: qty 10

## 2023-09-23 MED ORDER — EPOETIN ALFA-EPBX 10000 UNIT/ML IJ SOLN
INTRAMUSCULAR | Status: AC
  Filled 2023-09-23: qty 1

## 2023-09-23 MED ORDER — FENTANYL CITRATE PF 50 MCG/ML IJ SOSY
PREFILLED_SYRINGE | INTRAMUSCULAR | Status: AC
Start: 2023-09-23 — End: ?
  Filled 2023-09-23: qty 1

## 2023-09-23 MED ORDER — SODIUM CHLORIDE 0.9 % IV SOLN: INTRAVENOUS | Status: DC

## 2023-09-23 MED ORDER — FENTANYL CITRATE PF 50 MCG/ML IJ SOSY
PREFILLED_SYRINGE | INTRAMUSCULAR | Status: AC
  Filled 2023-09-23: qty 1

## 2023-09-23 MED ORDER — ALTEPLASE 1 MG/ML SYRINGE FOR VASCULAR PROCEDURE
INTRAMUSCULAR | Status: DC | PRN
  Administered 2023-09-23: 4 mg via INTRA_ARTERIAL

## 2023-09-23 MED ORDER — IODIXANOL 320 MG/ML IV SOLN
INTRAVENOUS | Status: DC | PRN
  Administered 2023-09-23: 30 mL

## 2023-09-23 MED ORDER — APIXABAN 5 MG PO TABS: 5.0000 mg | ORAL_TABLET | Freq: Two times a day (BID) | ORAL | Status: DC

## 2023-09-23 MED ORDER — MIDAZOLAM HCL 2 MG/2ML IJ SOLN
INTRAMUSCULAR | Status: DC | PRN
  Administered 2023-09-23: 1 mg via INTRAVENOUS
  Administered 2023-09-23: .5 mg via INTRAVENOUS

## 2023-09-23 MED ORDER — CEFAZOLIN SODIUM-DEXTROSE 1-4 GM/50ML-% IV SOLN
1.0000 g | INTRAVENOUS | Status: AC
  Administered 2023-09-23: 1 g via INTRAVENOUS

## 2023-09-23 MED ORDER — LIDOCAINE-EPINEPHRINE (PF) 1 %-1:200000 IJ SOLN
INTRAMUSCULAR | Status: DC | PRN
  Administered 2023-09-23: 10 mL

## 2023-09-23 MED ORDER — EPOETIN ALFA-EPBX 10000 UNIT/ML IJ SOLN
10000.0000 [IU] | INTRAMUSCULAR | Status: DC
  Administered 2023-09-23: 10000 [IU] via INTRAVENOUS
  Filled 2023-09-23: qty 2

## 2023-09-23 SURGICAL SUPPLY — 12 items
BALLOON LUTONIX 7X80X130 (BALLOONS) IMPLANT
CATH EMBOLECTOMY 5FR (BALLOONS) IMPLANT
CATH THROMBEC 7F 65 CLEANER15 (CATHETERS) IMPLANT
COVER PROBE ULTRASOUND 5X96 (MISCELLANEOUS) IMPLANT
DEVICE PRESTO INFLATION (MISCELLANEOUS) IMPLANT
DRAPE BRACHIAL (DRAPES) IMPLANT
KIT MICROPUNCTURE VSI 5F STIFF (SHEATH) IMPLANT
PACK ANGIOGRAPHY (CUSTOM PROCEDURE TRAY) ×1 IMPLANT
SHEATH BRITE TIP 6FRX5.5 (SHEATH) IMPLANT
SHEATH BRITE TIP 7FRX5.5 (SHEATH) IMPLANT
SUT MNCRL AB 4-0 PS2 18 (SUTURE) IMPLANT
WIRE SUPRACORE 190CM (WIRE) IMPLANT

## 2023-09-23 NOTE — H&P (View-Only) (Signed)
  Progress Note    09/23/2023 7:35 AM 4 Days Post-Op  Subjective:  Jody Nelson is a 73 y.o. female with end-stage renal disease with left AV graft fistula sent from dialysis for access issues. Patient is resting comfortably in bed this morning. No complaints overnight. Temporary dialysis access is in the patient's right groin. No complications. Vitals all remained stable.   Review of Systems  Constitutional:  Constitutional negative. Eyes: Eyes negative.  Respiratory: Respiratory negative.  Cardiovascular: Cardiovascular negative.  GI: Gastrointestinal negative.  GU: Genitourinary negative. Musculoskeletal: Musculoskeletal negative.  Skin: Skin negative.  Neurological: Neurological negative. Psychiatric: Psychiatric negative.  All other systems reviewed and are negative   Vitals:   09/22/23 2024 09/23/23 0332  BP: (!) 128/55 (!) 119/50  Pulse: (!) 104 97  Resp: 20 20  Temp: 99.5 F (37.5 C) 98.8 F (37.1 C)  SpO2: 100% 100%   Physical Exam: Cardiac:  RRR, normal S1-S2, no rubs clicks gallops or murmurs noted. Lungs: Normal labored breathing, no rales rhonchi or wheezing on auscultation noted. Incisions: Right femoral vein insertion of temporary dialysis catheter.  No complications to note no hematoma seroma. Extremities: Left upper extremity AV graft without pulsation or bruit. Abdomen: Morbidly obese, soft, nontender nondistended Neurologic: Alert and oriented x 3 follows commands and answers questions appropriately.  CBC    Component Value Date/Time   WBC 5.8 09/22/2023 0514   RBC 2.72 (L) 09/22/2023 0514   HGB 8.5 (L) 09/22/2023 0514   HCT 26.8 (L) 09/22/2023 0514   PLT 125 (L) 09/22/2023 0514   MCV 98.5 09/22/2023 0514   MCH 31.3 09/22/2023 0514   MCHC 31.7 09/22/2023 0514   RDW 15.2 09/22/2023 0514   LYMPHSABS 1.6 07/16/2023 2138   MONOABS 1.1 (H) 07/16/2023 2138   EOSABS 0.2 07/16/2023 2138   BASOSABS 0.0 07/16/2023 2138    BMET    Component Value  Date/Time   NA 135 09/22/2023 0514   K 4.1 09/22/2023 0514   CL 99 09/22/2023 0514   CO2 28 09/22/2023 0514   GLUCOSE 143 (H) 09/22/2023 0514   BUN 19 09/22/2023 0514   CREATININE 4.84 (H) 09/22/2023 0514   CALCIUM  8.3 (L) 09/22/2023 0514   GFRNONAA 9 (L) 09/22/2023 0514    INR    Component Value Date/Time   INR 1.6 (H) 03/13/2023 0630    No intake or output data in the 24 hours ending 09/23/23 0735   Assessment/Plan:  73 y.o. female is s/p 73 y.o. female who presents to [facility] emergency department with clotted left upper extremity AV graft.  Patient is now postop day 4 from temporary dialysis catheter placement right femoral vein.  No complications to note.  4 Days Post-Op   Vascular surgery plans on taking patient to the vascular lab later today on 09/23/2023 for left upper extremity venogram with possible intervention and possible dialysis permacatheter placement.   I had a long detailed conversation with the patient at the bedside this morning with the patient regarding the procedure, benefits, risk, complications.  Patient verbalized understanding wishes to proceed.  I answered all the patient's questions this morning.  Patient has been NPO since midnight.   I discussed the case in detail with Dr. Mikki Alexander MD and he agrees with the plan.   DVT prophylaxis: Eliquis  5 mg twice daily, aspirin  81 mg daily, and Lipitor  80 mg.   Annamaria Barrette [facility] 09/23/2023 7:35 AM

## 2023-09-23 NOTE — Plan of Care (Signed)

## 2023-09-23 NOTE — Interval H&P Note (Signed)
 History and Physical Interval Note:  09/23/2023 12:18 PM  Jody Nelson  has presented today for surgery, with the diagnosis of esrd, clotted access.  The various methods of treatment have been discussed with the patient and family. After consideration of risks, benefits and other options for treatment, the patient has consented to  Procedure(s): PERIPHERAL VASCULAR THROMBECTOMY (N/A) as a surgical intervention.  The patient's history has been reviewed, patient examined, no change in status, stable for surgery.  I have reviewed the patient's chart and labs.  Questions were answered to the patient's satisfaction.     Christophe Rising

## 2023-09-23 NOTE — Progress Notes (Signed)
 Hemodialysis Note:  Received patient in bed to unit. Alert and oriented. Informed consent singed and in chart.  Treatment initiated: 1525 Treatment completed: 1901  Access used: Right Femoral catheter Access issues: None  Patient tolerated well. Transported back to room, alert without acute distress. Report given to patient's RN.  Total UF removed: 2 Liters Medications given: Retacrit  10000 units IV  Post HD weight: 132.5 Kg  [facility]

## 2023-09-23 NOTE — Op Note (Signed)
 [facility]    OPERATIVE NOTE   PROCEDURE: 1.  Left brachial artery to axillary vein arteriovenous graft cannulation under ultrasound guidance in both a retrograde and then antegrade fashion crossing 2.  Left arm shuntogram and central venogram 3.  Catheter directed thrombolysis with 4 mg of TPA  4.  Mechanical thrombectomy to the left brachial artery to axillary vein AV graft with the the 7 [place] cleaner device 5.  Fogarty embolectomy for residual arterial plug 6.  Percutaneous transluminal angioplasty of the mid AV graft with 7 mm diameter by 8 cm length Lutonix drug-coated angioplasty balloon  PRE-OPERATIVE DIAGNOSIS: 1. ESRD 2.  Thrombosed left brachial artery to axillary vein arteriovenous graft  POST-OPERATIVE DIAGNOSIS: same as above   SURGEON: Mikki Alexander, MD  ANESTHESIA: local with Moderate Conscious Sedation for approximately 22 minutes using 1.5 mg of Versed  and 62.5 mcg of Fentanyl   ESTIMATED BLOOD LOSS: 10 cc  FINDING(S): Thrombosed graft with 70 to 75% stenosis in the midportion of the AV graft near the access site.  The central venous circulation was patent.  SPECIMEN(S):  None  CONTRAST: 30 cc  FLUORO TIME:  3.3 minutes  INDICATIONS: Patient is a 73 y.o.female who presents with a thrombosed left brachial artery to axillary vein arteriovenous graft.  The patient is scheduled for an attempted declot and shuntogram.  The patient is aware the risks include but are not limited to: bleeding, infection, thrombosis of the cannulated access, and possible anaphylactic reaction to the contrast.  The patient is aware of the risks of the procedure and elects to proceed forward.  DESCRIPTION: After full informed written consent was obtained, the patient was brought back to the angiography suite and placed supine upon the angiography table.  The patient was connected to monitoring equipment. Moderate conscious sedation was administered with a face to face  encounter with the patient throughout the procedure with my supervision of the RN administering medicines and monitoring the patient's vital signs, pulse oximetry, telemetry and mental status throughout from the start of the procedure until the patient was taken to the recovery room. The left arm was prepped and draped in the standard fashion for a percutaneous access intervention.  Under ultrasound guidance, the left brachial artery to axillary vein arteriovenous graft was cannulated with a micropuncture needle under direct ultrasound guidance due to the pulseless nature of the graft in both an antegrade and a retrograde fashion crossing, and permanent images were performed.  The microwire was advanced and the needle was exchanged for the a microsheath.  I then upsized to a 7 Fr Sheath and imaging was performed.  Hand injections were completed to image the access including the central venous system. This demonstrated no flow within the AV graft.  Based on the images, this patient will need extensive treatment to salvage the graft. I then gave the patient 4000 units of intravenous heparin .  I then placed a supra core wire into the brachial artery from the retrograde sheath and into the axillary vein from the antegrade sheath. 4 mg of TPA were deployed. This was allowed to dwell. Mechanical thrombectomy using the cleaner device 7 [place] was then performed throughout the graft and into the axillary vein. This uncovered a mid graft stenosis.  A residual arterial plug was also seen at the arterial anastomosis. An attempt to clear the arterial plug was done with 3 passes of the Fogarty embolectomy balloon. This resulted in resolution of the arterial plug, and clearance of the  arterial side of the graft. The arterial outflow was seen to be intact distally. The retrograde sheath was removed. I then turned my attention to the thrombus in the distal graft and the axillary vein. Mechanical thrombectomy was performed again  using the 7 [place] cleaner device. This resulted in resolution of the thrombus with only the residual stenosis in the mid graft.  I then elected to treat this with a 7 mm diameter by 8 cm length Lutonix drug-coated angioplasty balloon inflated to 12 atm for 1 minute.  Completion imaging showed only about a 10% residual stenosis with brisk flow through the graft.    Based on the completion imaging, no further intervention is necessary.  The wire and balloon were removed from the sheath.  A 4-0 Monocryl purse-string suture was sewn around the sheath.  The sheath was removed while tying down the suture.  A sterile bandage was applied to the puncture site.  COMPLICATIONS: None  CONDITION: Stable   Mikki Alexander 09/23/2023 1:23 PM   This note was created with Dragon Medical transcription system. Any errors in dictation are purely unintentional.

## 2023-09-23 NOTE — Progress Notes (Signed)
 Patient with large brown formed stool  with brown loose liquid stool.  Patient states "relief" from rectal and abdominal pain.

## 2023-09-23 NOTE — Progress Notes (Signed)
 PROGRESS NOTE    Jody Nelson   WUJ:[id number] DOB: 12-11-1950  DOA: 09/19/2023 Date of Service: 09/23/23 which is hospital day 3  PCP: Patient, No Pcp Per    Hospital course / significant events:   HPI: 73 y.o. female with medical history significant for HFpEF, ESRD on HD MWF, DDD, depression, HTN, DDD, OSA, CAD, obesity, GERD, T2DM and CVA who presented to the ED for evaluation of vascular access problem.  Patient reports she went to HD on Friday and they were unable to dialyze her due to clotting of her LUE AVF.  She returned on Saturday and they were able to complete dialysis.  During her next HD session this morning, they were unable to cannulate the access site and there was no thrill or bruit.  Patient was sent to the ER for declotting of her AVF by vascular surgery.  05/12: Patient was sent to the Cath Lab for thrombectomy however after the BMP was obtained, patient was found to be hyperkalemic with potassium of 6.5.  Nephrology was consulted for emergent dialysis and TRH was consulted for admission.  A temporary HD catheter was placed in the right femoral vein.   05/13:  Vascular surgery scheduled a declot procedure for Friday 05/16.  Will continue Eliquis  and aspirin .  Continue dialysis through temporary catheter.  Today's potassium 3.8. 05/14-05/15: stable, awaiting vascular procedure 05/16: underwent LUE AV graft cannulation and thrombolysis/thrombectomy, balloon angioplasty - resolution of the thrombus with only the residual stenosis in the mid graft improved w/ ballon to only about 10% residual stenosis w/ good flow thru the graft. Dialysis this evening. Facility states can take her back over the weekend, if doin gwell anticipate d/c tomorrow      Consultants:  Vascular surgery Nephrology   Procedures/Surgeries: 09/19/23: placement triple lumen dialysis catheter right femoral vein       ASSESSMENT & PLAN:   Hyperkalemia Potassium improved  Monitor BMP Dialysis per  nephrology   Complication of vascular access for dialysis: Occluded left fistula.   S/p LUE AV graft cannulation and thrombolysis/thrombectomy, balloon angioplasty - resolution of the thrombus and good blood flow thru graft  Continue aspirin  and Eliquis .   ESRD (end stage renal disease)  Continue dialysis through temporary catheter.   Thrombocytopenia  Platelet count has gone up and down in the past.   Check hepatitis C Monitor CBC   Hyponatremia, chronic  Continue with dialysis   Type 2 diabetes mellitus  SSI Hyperlipidemia Lipitor .     History of stroke On aspirin  Plavix and Lipitor    Anemia of chronic disease Last hemoglobin 8.1, last ferritin 678 Monitor CBC    Class 2 obesity based on BMI: Body mass index is 36.22 kg/m.Jody Nelson Significantly low or high BMI is associated with higher medical risk.  Underweight - under 18  overweight - 25 to 29 obese - 30 or more Class 1 obesity: BMI of 30.0 to 34 Class 2 obesity: BMI of 35.0 to 39 Class 3 obesity: BMI of 40.0 to 49 Super Morbid Obesity: BMI 50-59 Super-super Morbid Obesity: BMI 60+ Healthy nutrition and physical activity advised as adjunct to other disease management and risk reduction treatments    DVT prophylaxis: eliquis  IV fluids: no continuous IV fluids  Nutrition: renal Central lines / other devices: HD cath R groin  Code Status: FULL CODE ACP documentation reviewed: none on file in [facility]  TOC needs: back to long term care when able, PT/OT eval pending in case needing rehab which will require  auth  Medical barriers to dispo:  Facility states can take her back over the weekend, if doing well after dialysis today and into tomorrow, anticipate d/c tomorrow              Subjective / Brief ROS:  Patient reports no concerns today  Denies CP/SOB.  Pain controlled.  Denies new weakness.   Reports no concerns w/ urination/defecation.   Family Communication: none at this time     Objective  Findings:  Vitals:   09/23/23 1525 09/23/23 1530 09/23/23 1600 09/23/23 1630  BP: (!) 115/43 (!) 115/43 118/81 (!) 112/55  Pulse: 78 78 78 85  Resp: 11 12 13 15   Temp:      TempSrc:      SpO2: 98% 100% 100% 100%  Weight:      Height:       No intake or output data in the 24 hours ending 09/23/23 1701  Filed Weights   09/21/23 1334 09/21/23 1730 09/23/23 1501  Weight: 135.7 kg 134.6 kg 134.5 kg    Examination:  Physical Exam Constitutional:      General: She is not in acute distress. Cardiovascular:     Rate and Rhythm: Normal rate and regular rhythm.  Pulmonary:     Effort: Pulmonary effort is normal.     Breath sounds: Normal breath sounds.  Neurological:     Mental Status: She is alert.  Psychiatric:        Mood and Affect: Mood normal.        Behavior: Behavior normal.          Scheduled Medications:   apixaban   5 mg Oral BID   aspirin   81 mg Oral Daily   atorvastatin   80 mg Oral Daily   calcium  acetate  667 mg Oral TID WC   Chlorhexidine  Gluconate Cloth  6 each Topical Q0600   epoetin  alfa-epbx (RETACRIT ) injection  10,000 Units Intravenous Q M,W,F-1800   fluticasone   1 spray Each Nare Daily   folic acid   1 mg Oral Daily   insulin  aspart  0-5 Units Subcutaneous QHS   insulin  aspart  0-6 Units Subcutaneous TID WC   latanoprost   1 drop Both Eyes QHS   lidocaine -prilocaine   1 Application Topical Once per day on Monday Wednesday Friday   loratadine  10 mg Oral Daily   montelukast   10 mg Oral Daily   pantoprazole   40 mg Oral Daily   pregabalin   25 mg Oral Daily    Continuous Infusions:    PRN Medications:  acetaminophen  **OR** acetaminophen , diclofenac Sodium, HYDROcodone -acetaminophen , ondansetron  **OR** ondansetron  (ZOFRAN ) IV, mouth rinse, senna-docusate  Antimicrobials from admission:  Anti-infectives (From admission, onward)    Start     Dose/Rate Route Frequency Ordered Stop   09/23/23 1117  ceFAZolin  (ANCEF ) IVPB 1 g/50 mL premix        1  g 100 mL/hr over 30 Minutes Intravenous 30 min pre-op  09/23/23 1117 09/23/23 1322   09/19/23 1600  ceFAZolin  (ANCEF ) IVPB 1 g/50 mL premix  Status:  Discontinued        1 g 100 mL/hr over 30 Minutes Intravenous 30 min pre-op  09/19/23 1418 09/23/23 1118           Data Reviewed:  I have personally reviewed the following...  CBC: Recent Labs  Lab 09/19/23 1407 09/20/23 0222 09/21/23 0538 09/22/23 0514 09/23/23 1529  WBC 5.4 4.5 4.1 5.8 6.8  HGB 9.4* 8.1* 9.0* 8.5* 9.7*  HCT 31.6* 26.5* 29.7*  26.8* 30.6*  MCV 101.3* 100.8* 101.0* 98.5 98.4  PLT 115* 135* 135* 125* 121*   Basic Metabolic Panel: Recent Labs  Lab 09/20/23 0028 09/20/23 0222 09/21/23 0538 09/22/23 0514 09/23/23 1529  NA 135 133* 135 135 134*  K 3.8 3.8 4.5 4.1 4.6  CL 101 99 102 99 98  CO2 27 26 26 28 27   GLUCOSE 104* 91 85 143* 164*  BUN 19 20 31* 19 31*  CREATININE 5.70* 5.90* 7.65* 4.84* 7.12*  CALCIUM  8.0* 8.1* 8.5* 8.3* 8.1*  MG  --  2.3  --   --   --   PHOS  --  3.5  --   --  5.0*   GFR: Estimated Creatinine Clearance: 9.9 mL/min (A) (by C-G formula based on SCr of 7.12 mg/dL (H)). Liver Function Tests: Recent Labs  Lab 09/20/23 0222 09/23/23 1529  ALBUMIN  2.5* 2.5*   No results for input(s): "LIPASE", "AMYLASE" in the last 168 hours. No results for input(s): "AMMONIA" in the last 168 hours. Coagulation Profile: No results for input(s): "INR", "PROTIME" in the last 168 hours. Cardiac Enzymes: No results for input(s): "CKTOTAL", "CKMB", "CKMBINDEX", "TROPONINI" in the last 168 hours. BNP (last 3 results) No results for input(s): "PROBNP" in the last 8760 hours. HbA1C: No results for input(s): "HGBA1C" in the last 72 hours.  CBG: Recent Labs  Lab 09/22/23 1205 09/22/23 1658 09/22/23 2021 09/23/23 0804 09/23/23 1133  GLUCAP 115* 105* 130* 103* 86   Lipid Profile: No results for input(s): "CHOL", "HDL", "LDLCALC", "TRIG", "CHOLHDL", "LDLDIRECT" in the last 72 hours. Thyroid   Function Tests: No results for input(s): "TSH", "T4TOTAL", "FREET4", "T3FREE", "THYROIDAB" in the last 72 hours. Anemia Panel: No results for input(s): "VITAMINB12", "FOLATE", "FERRITIN", "TIBC", "IRON ", "RETICCTPCT" in the last 72 hours. Most Recent Urinalysis On File:  No results found for: "COLORURINE", "APPEARANCEUR", "LABSPEC", "PHURINE", "GLUCOSEU", "HGBUR", "BILIRUBINUR", "KETONESUR", "PROTEINUR", "UROBILINOGEN", "NITRITE", "LEUKOCYTESUR" Sepsis Labs: @LABRCNTIP (procalcitonin:4,lacticidven:4) Microbiology: No results found for this or any previous visit (from the past 240 hours).    Radiology Studies last 3 days: PERIPHERAL VASCULAR CATHETERIZATION Result Date: 09/23/2023 See surgical note for result.        Rosealyn Little, DO [facility] 09/23/2023, 5:01 PM    Dictation software may have been used to generate the above note. Typos may occur and escape review in typed/dictated notes. Please contact Dr Authur Leghorn directly for clarity if needed.  Staff may message me via secure chat in Epic  but this may not receive an immediate response,  please page me for urgent matters!  If 7PM-7AM, please contact night coverage [email or web address]

## 2023-09-23 NOTE — Care Management Important Message (Signed)
 Important Message  Patient Details  Name: Jody Nelson MRN: [id number] Date of Birth: 24-Mar-1951   Important Message Given:  Yes - Medicare IM     Anise Kerns 09/23/2023, 1:14 PM

## 2023-09-23 NOTE — Progress Notes (Signed)
  Progress Note    09/23/2023 7:35 AM 4 Days Post-Op  Subjective:  Jody Nelson is a 73 y.o. female with end-stage renal disease with left AV graft fistula sent from dialysis for access issues. Patient is resting comfortably in bed this morning. No complaints overnight. Temporary dialysis access is in the patient's right groin. No complications. Vitals all remained stable.   Review of Systems  Constitutional:  Constitutional negative. Eyes: Eyes negative.  Respiratory: Respiratory negative.  Cardiovascular: Cardiovascular negative.  GI: Gastrointestinal negative.  GU: Genitourinary negative. Musculoskeletal: Musculoskeletal negative.  Skin: Skin negative.  Neurological: Neurological negative. Psychiatric: Psychiatric negative.  All other systems reviewed and are negative   Vitals:   09/22/23 2024 09/23/23 0332  BP: (!) 128/55 (!) 119/50  Pulse: (!) 104 97  Resp: 20 20  Temp: 99.5 F (37.5 C) 98.8 F (37.1 C)  SpO2: 100% 100%   Physical Exam: Cardiac:  RRR, normal S1-S2, no rubs clicks gallops or murmurs noted. Lungs: Normal labored breathing, no rales rhonchi or wheezing on auscultation noted. Incisions: Right femoral vein insertion of temporary dialysis catheter.  No complications to note no hematoma seroma. Extremities: Left upper extremity AV graft without pulsation or bruit. Abdomen: Morbidly obese, soft, nontender nondistended Neurologic: Alert and oriented x 3 follows commands and answers questions appropriately.  CBC    Component Value Date/Time   WBC 5.8 09/22/2023 0514   RBC 2.72 (L) 09/22/2023 0514   HGB 8.5 (L) 09/22/2023 0514   HCT 26.8 (L) 09/22/2023 0514   PLT 125 (L) 09/22/2023 0514   MCV 98.5 09/22/2023 0514   MCH 31.3 09/22/2023 0514   MCHC 31.7 09/22/2023 0514   RDW 15.2 09/22/2023 0514   LYMPHSABS 1.6 07/16/2023 2138   MONOABS 1.1 (H) 07/16/2023 2138   EOSABS 0.2 07/16/2023 2138   BASOSABS 0.0 07/16/2023 2138    BMET    Component Value  Date/Time   NA 135 09/22/2023 0514   K 4.1 09/22/2023 0514   CL 99 09/22/2023 0514   CO2 28 09/22/2023 0514   GLUCOSE 143 (H) 09/22/2023 0514   BUN 19 09/22/2023 0514   CREATININE 4.84 (H) 09/22/2023 0514   CALCIUM  8.3 (L) 09/22/2023 0514   GFRNONAA 9 (L) 09/22/2023 0514    INR    Component Value Date/Time   INR 1.6 (H) 03/13/2023 0630    No intake or output data in the 24 hours ending 09/23/23 0735   Assessment/Plan:  73 y.o. female is s/p 73 y.o. female who presents to [facility] emergency department with clotted left upper extremity AV graft.  Patient is now postop day 4 from temporary dialysis catheter placement right femoral vein.  No complications to note.  4 Days Post-Op   Vascular surgery plans on taking patient to the vascular lab later today on 09/23/2023 for left upper extremity venogram with possible intervention and possible dialysis permacatheter placement.   I had a long detailed conversation with the patient at the bedside this morning with the patient regarding the procedure, benefits, risk, complications.  Patient verbalized understanding wishes to proceed.  I answered all the patient's questions this morning.  Patient has been NPO since midnight.   I discussed the case in detail with Dr. Mikki Alexander MD and he agrees with the plan.   DVT prophylaxis: Eliquis  5 mg twice daily, aspirin  81 mg daily, and Lipitor  80 mg.   Annamaria Barrette [facility] 09/23/2023 7:35 AM

## 2023-09-23 NOTE — Progress Notes (Signed)
 [facility] [place], [place] 09/23/23  Subjective:   Hospital day # 3 Patient is an established ESRD patient who was sent to the emergency room for clotted left AV graft.  Upon presentation to the ER on 09/19/2023 she was found to have elevated potassium of 6.5.  Vascular surgery was consulted and a right femoral dialysis catheter was placed.    Patient seen resting quietly Currently n.p.o. for vascular procedure   Objective:  Vital signs in last 24 hours:  Temp:  [98.6 F (37 C)-99.5 F (37.5 C)] 98.6 F (37 C) (05/16 0806) Pulse Rate:  [88-104] 88 (05/16 0806) Resp:  [18-20] 18 (05/16 0806) BP: (119-135)/(50-69) 135/57 (05/16 0806) SpO2:  [100 %] 100 % (05/16 0806)  Weight change:  Filed Weights   09/19/23 1250 09/21/23 1334 09/21/23 1730  Weight: 95.7 kg 135.7 kg 134.6 kg    Intake/Output:   No intake or output data in the 24 hours ending 09/23/23 1151    Physical Exam: General: Laying in the bed, no acute distress  HEENT Moist oral mucous membranes, anicteric sclera  Pulm/lungs Normal breathing effort on room air  CVS/Heart Regular, no rub or gallop  Abdomen:  Soft, obese, nontender  Extremities: No significant peripheral edema  Neurologic: Alert, oriented, answering questions appropriately  Skin: Warm, dry  Access: Left upper arm AV graft, no bruit or thrill   Femoral temporary dialysis catheter     Basic Metabolic Panel:  Recent Labs  Lab 09/19/23 1407 09/20/23 0028 09/20/23 0222 09/21/23 0538 09/22/23 0514  NA 139 135 133* 135 135  K 6.5* 3.8 3.8 4.5 4.1  CL 107 101 99 102 99  CO2 23 27 26 26 28   GLUCOSE 89 104* 91 85 143*  BUN 31* 19 20 31* 19  CREATININE 8.32* 5.70* 5.90* 7.65* 4.84*  CALCIUM  9.1 8.0* 8.1* 8.5* 8.3*  MG  --   --  2.3  --   --   PHOS  --   --  3.5  --   --      CBC: Recent Labs  Lab 09/19/23 1407 09/20/23 0222 09/21/23 0538 09/22/23 0514  WBC 5.4 4.5 4.1 5.8  HGB 9.4* 8.1* 9.0* 8.5*  HCT 31.6*  26.5* 29.7* 26.8*  MCV 101.3* 100.8* 101.0* 98.5  PLT 115* 135* 135* 125*      Lab Results  Component Value Date   HEPBSAG NON REACTIVE 03/14/2023      Microbiology:  No results found for this or any previous visit (from the past 240 hours).  Coagulation Studies: No results for input(s): "LABPROT", "INR" in the last 72 hours.  Urinalysis: No results for input(s): "COLORURINE", "LABSPEC", "PHURINE", "GLUCOSEU", "HGBUR", "BILIRUBINUR", "KETONESUR", "PROTEINUR", "UROBILINOGEN", "NITRITE", "LEUKOCYTESUR" in the last 72 hours.  Invalid input(s): "APPERANCEUR"    Imaging: No results found.    Medications:    sodium chloride       ceFAZolin  (ANCEF ) IV      [MAR Hold] apixaban   5 mg Oral BID   [MAR Hold] aspirin   81 mg Oral Daily   [MAR Hold] atorvastatin   80 mg Oral Daily   [MAR Hold] calcium  acetate  667 mg Oral TID WC   [MAR Hold] Chlorhexidine  Gluconate Cloth  6 each Topical Q0600   [MAR Hold] fluticasone   1 spray Each Nare Daily   [MAR Hold] folic acid   1 mg Oral Daily   [MAR Hold] insulin  aspart  0-5 Units Subcutaneous QHS   [MAR Hold] insulin  aspart  0-6 Units  Subcutaneous TID WC   [MAR Hold] latanoprost   1 drop Both Eyes QHS   [MAR Hold] lidocaine -prilocaine   1 Application Topical Once per day on Monday Wednesday Friday   [facility] Hold] loratadine  10 mg Oral Daily   [MAR Hold] montelukast   10 mg Oral Daily   [MAR Hold] pantoprazole   40 mg Oral Daily   [MAR Hold] pregabalin   25 mg Oral Daily   [MAR Hold] acetaminophen  **OR** [MAR Hold] acetaminophen , [MAR Hold] diclofenac Sodium, [MAR Hold] HYDROcodone -acetaminophen , [MAR Hold] ondansetron  **OR** [MAR Hold] ondansetron  (ZOFRAN ) IV, [MAR Hold] mouth rinse, [MAR Hold] senna-docusate  Assessment/ Plan:  73 y.o. female with  heart failure with preserved ejection fraction, end-stage renal disease, degenerative joint disease, depression, hypertension, sleep apnea, coronary disease, obesity, GERD, diabetes type 2, history  of stroke   admitted on 09/19/2023 for Arteriovenous fistula occlusion, initial encounter (HCC) [Z61.096E] Hyperkalemia [E87.5] Complication of vascular access for dialysis, sequela [T82.9XXS]  1.  Hyperkalemia in the setting of ESRD Potassium was elevated at 6.5 at the time of admission.  Patient underwent emergent dialysis and potassium is now corrected.  We discussed low potassium diet.  She was drinking orange juice at home.  Advised patient not to drink orange juice.  Avoid potatoes, fries, chips.  Patient scheduled to receive dialysis later today after vascular procedure.  2.  Complication of dialysis access. Patient's AV graft is clotted.  Currently n.p.o. for scheduled angiogram later today.  3.  Anemia and chronic kidney disease Hemoglobin 8.5 on 09/20/2023. We order ESA with dialysis.  4.  Secondary hyperparathyroidism Continue calcium  acetate with meals.  Will continue to monitor bone minerals during this admission.   LOS: 3 Anola King 5/16/202511:51 AM  [address] [place], [place] [phone]

## 2023-09-23 NOTE — Progress Notes (Signed)
 PT Cancellation Note  Patient Details Name: Jody Nelson MRN: [id number] DOB: 03-25-1951   Cancelled Treatment:    Reason Eval/Treat Not Completed: Other (comment): Pt with temp fem cath.  Per conversation with MD patient with likely upcoming procedure for perm cath placement and possible temp cath removal.  OK per MD to hold PT evaluation until after procedure.  Will attempt to see pt at a future date/time as medically appropriate.    Lavenia Post PT, DPT 09/23/23, 10:45 AM

## 2023-09-24 DIAGNOSIS — E875 Hyperkalemia: Secondary | ICD-10-CM | POA: Diagnosis not present

## 2023-09-24 LAB — GLUCOSE, CAPILLARY
Glucose-Capillary: 119 mg/dL — ABNORMAL HIGH (ref 70–99)
Glucose-Capillary: 157 mg/dL — ABNORMAL HIGH (ref 70–99)
Glucose-Capillary: 180 mg/dL — ABNORMAL HIGH (ref 70–99)
Glucose-Capillary: 160 mg/dL — ABNORMAL HIGH (ref 70–99)

## 2023-09-24 MED ORDER — SENNOSIDES-DOCUSATE SODIUM 8.6-50 MG PO TABS: 2.0000 | ORAL_TABLET | Freq: Every evening | ORAL | Status: AC | PRN

## 2023-09-24 MED ORDER — HYDROCODONE-ACETAMINOPHEN 5-325 MG PO TABS: 1.0000 | ORAL_TABLET | Freq: Four times a day (QID) | ORAL | 0 refills | Status: DC | PRN

## 2023-09-24 MED ORDER — PHENYLEPHRINE IN HARD FAT 0.25 % RE SUPP: 1.0000 | Freq: Two times a day (BID) | RECTAL | Status: DC | PRN

## 2023-09-24 MED ORDER — GERHARDT'S BUTT CREAM: 1.0000 | TOPICAL_CREAM | Freq: Four times a day (QID) | CUTANEOUS | Status: DC | PRN

## 2023-09-24 MED ORDER — PREGABALIN 25 MG PO CAPS: 25.0000 mg | ORAL_CAPSULE | Freq: Every day | ORAL | 0 refills | Status: AC

## 2023-09-24 MED ORDER — TORSEMIDE 10 MG PO TABS: 10.0000 mg | ORAL_TABLET | Freq: Every day | ORAL | Status: AC | PRN

## 2023-09-24 MED ORDER — GERHARDT'S BUTT CREAM
TOPICAL_CREAM | Freq: Four times a day (QID) | CUTANEOUS | Status: DC | PRN
  Filled 2023-09-24: qty 60

## 2023-09-24 MED ORDER — PHENYLEPHRINE IN HARD FAT 0.25 % RE SUPP
1.0000 | Freq: Two times a day (BID) | RECTAL | Status: DC | PRN
  Filled 2023-09-24: qty 1

## 2023-09-24 NOTE — Plan of Care (Signed)

## 2023-09-24 NOTE — TOC Progression Note (Signed)
 Transition of Care [facility]) - Progression Note    Patient Details  Name: Jody Nelson MRN: [id number] Date of Birth: 1951/02/06  Transition of Care [facility]) CM/SW Contact  [place] L Soph[place], [place] Phone Number: 09/24/2023, 2:44 PM  Clinical Narrative:     CSW spoke with Lonzell Robin at [facility] to arrange transportation for patient to [facility]. Patient will be transported by 10pm this evening.        Expected Discharge Plan and Services         Expected Discharge Date: 09/24/23                                     Social Determinants of Health (SDOH) Interventions SDOH Screenings   Food Insecurity: No Food Insecurity (09/19/2023)  Housing: Low Risk  (09/19/2023)  Transportation Needs: No Transportation Needs (09/19/2023)  Utilities: Not At Risk (09/19/2023)  Financial Resource Strain: Low Risk  (07/07/2023)   Received from [facility]  Physical Activity: Unknown (07/17/2017)   Received from [facility], [facility]  Social Connections: Moderately Isolated (09/19/2023)  Stress: No Stress Concern Present (11/01/2018)   Received from [facility]  Tobacco Use: Medium Risk (09/19/2023)    Readmission Risk Interventions     No data to display

## 2023-09-24 NOTE — Progress Notes (Signed)
 [facility]  PROGRESS NOTE   Subjective:   Patient seen at bedside.  He ate well today.  Had stable dialysis yesterday.  Objective:  Vital signs: Blood pressure (!) 110/52, pulse 95, temperature 98.7 F (37.1 C), temperature source Oral, resp. rate 18, height 5\' 4"  (1.626 m), weight 132.5 kg, SpO2 100%.  Intake/Output Summary (Last 24 hours) at 09/24/2023 1332 Last data filed at 09/24/2023 1100 Gross per 24 hour  Intake 470 ml  Output 2000 ml  Net -1530 ml   Filed Weights   09/21/23 1730 09/23/23 1501 09/23/23 1902  Weight: 134.6 kg 134.5 kg 132.5 kg     Physical Exam: General:  No acute distress  Head:  Normocephalic, atraumatic. Moist oral mucosal membranes  Eyes:  Anicteric  Neck:  Supple  Lungs:   Clear to auscultation, normal effort  Heart:  S1S2 no rubs  Abdomen:   Soft, nontender, bowel sounds present  Extremities:  peripheral edema.  Neurologic:  Awake, alert, following commands  Skin:  No lesions  Access:     Basic Metabolic Panel: Recent Labs  Lab 09/20/23 0028 09/20/23 0222 09/21/23 0538 09/22/23 0514 09/23/23 1529  NA 135 133* 135 135 134*  K 3.8 3.8 4.5 4.1 4.6  CL 101 99 102 99 98  CO2 27 26 26 28 27   GLUCOSE 104* 91 85 143* 164*  BUN 19 20 31* 19 31*  CREATININE 5.70* 5.90* 7.65* 4.84* 7.12*  CALCIUM  8.0* 8.1* 8.5* 8.3* 8.1*  MG  --  2.3  --   --   --   PHOS  --  3.5  --   --  5.0*   GFR: Estimated Creatinine Clearance: 9.8 mL/min (A) (by C-G formula based on SCr of 7.12 mg/dL (H)).  Liver Function Tests: Recent Labs  Lab 09/20/23 0222 09/23/23 1529  ALBUMIN  2.5* 2.5*   No results for input(s): "LIPASE", "AMYLASE" in the last 168 hours. No results for input(s): "AMMONIA" in the last 168 hours.  CBC: Recent Labs  Lab 09/19/23 1407 09/20/23 0222 09/21/23 0538 09/22/23 0514 09/23/23 1529  WBC 5.4 4.5 4.1 5.8 6.8  HGB 9.4* 8.1* 9.0* 8.5* 9.7*  HCT 31.6* 26.5* 29.7* 26.8* 30.6*  MCV 101.3* 100.8* 101.0* 98.5 98.4   PLT 115* 135* 135* 125* 121*     HbA1C: Hgb A1c MFr Bld  Date/Time Value Ref Range Status  09/20/2023 12:28 AM 5.0 4.8 - 5.6 % Final    Comment:    (NOTE)         Prediabetes: 5.7 - 6.4         Diabetes: >6.4         Glycemic control for adults with diabetes: <7.0   03/13/2023 04:54 AM 7.3 (H) 4.8 - 5.6 % Final    Comment:    (NOTE) Pre diabetes:          5.7%-6.4%  Diabetes:              >6.4%  Glycemic control for   <7.0% adults with diabetes     Urinalysis: No results for input(s): "COLORURINE", "LABSPEC", "PHURINE", "GLUCOSEU", "HGBUR", "BILIRUBINUR", "KETONESUR", "PROTEINUR", "UROBILINOGEN", "NITRITE", "LEUKOCYTESUR" in the last 72 hours.  Invalid input(s): "APPERANCEUR"    Imaging: PERIPHERAL VASCULAR CATHETERIZATION Result Date: 09/23/2023 See surgical note for result.    Medications:     apixaban   5 mg Oral BID   aspirin   81 mg Oral Daily   atorvastatin   80 mg Oral Daily   calcium  acetate  667 mg Oral TID WC   Chlorhexidine  Gluconate Cloth  6 each Topical Q0600   epoetin  alfa-epbx (RETACRIT ) injection  10,000 Units Intravenous Q M,W,F-1800   fluticasone   1 spray Each Nare Daily   folic acid   1 mg Oral Daily   insulin  aspart  0-5 Units Subcutaneous QHS   insulin  aspart  0-6 Units Subcutaneous TID WC   latanoprost   1 drop Both Eyes QHS   lidocaine -prilocaine   1 Application Topical Once per day on Monday Wednesday Friday   loratadine   10 mg Oral Daily   montelukast   10 mg Oral Daily   pantoprazole   40 mg Oral Daily   pregabalin   25 mg Oral Daily    Assessment/ Plan:     82 close female with history of diabetes, peripheral vascular disease, history of CVA, hypertension, coronary artery disease, congestive heart failure, end-stage renal disease on dialysis admitted for AV graft occlusion.  #1: AV graft thrombosis: Patient had successful thrombolysis yesterday.  #2: End-stage renal disease: She had dialysis with right groin catheter.  The catheter  was removed last night.  Had stable dialysis yesterday.  #3: Anemia: Continue Retacrit  and iron  at dialysis.  #4: Secondary hyperparathyroidism: Will continue calcium  acetate as ordered.  #5: Diabetes: Continue insulin  as ordered.  Patient is stable from renal standpoint for discharge today.  Labs and medications reviewed. Will continue to follow along with you.   LOS: 4 Loveta Dellis, MD [facility] 5/17/20251:32 PM

## 2023-09-24 NOTE — TOC Progression Note (Signed)
 Transition of Care [facility]) - Progression Note    Patient Details  Name: Jody Nelson MRN: [id number] Date of Birth: Oct 21, 1950  Transition of Care [facility]) CM/SW Contact  Joslyn Nim, RN Phone Number: 09/24/2023, 2:55 PM  Clinical Narrative:    MD asked if patient can go back to the facility today. CM called [facility] and apoke with Amy. CM informed her that patient will be d/c today. CM ask what is the number for nurse to call report number to fax the d/c summary. She states that nurse need to call the main number and ask for Buffy Johnson. Fax number is [phone].  There is no d/c order at this time.   Cm called daughter to inform her of possible d/c back to the facility. She is in agreement          Expected Discharge Plan and Services         Expected Discharge Date: 09/24/23                                     Social Determinants of Health (SDOH) Interventions SDOH Screenings   Food Insecurity: No Food Insecurity (09/19/2023)  Housing: Low Risk  (09/19/2023)  Transportation Needs: No Transportation Needs (09/19/2023)  Utilities: Not At Risk (09/19/2023)  Financial Resource Strain: Low Risk  (07/07/2023)   Received from [facility]  Physical Activity: Unknown (07/17/2017)   Received from [facility], [facility]  Social Connections: Moderately Isolated (09/19/2023)  Stress: No Stress Concern Present (11/01/2018)   Received [facility]  Tobacco Use: Medium Risk (09/19/2023)    Readmission Risk Interventions     No data to display

## 2023-09-24 NOTE — Progress Notes (Signed)
 Report called to [facility] facility for patient being discharged back today. Patient in no pain or distress at this time.

## 2023-09-24 NOTE — Discharge Summary (Signed)
 Physician Discharge Summary   Patient: Jody Nelson MRN: [id number]  DOB: 04-02-1951   Admit:     Date of Admission: 09/19/2023 Admitted from: long term care SNF   Discharge: Date of discharge: 09/24/23 Disposition: Skilled nursing facility Condition at discharge: good  CODE STATUS: FULL CODE     Discharge Physician: Melodi Sprung, DO [facility]     PCP: Patient, No Pcp Per  Recommendations for Outpatient Follow-up:  Follow up with PCP / SNF attending in 1-2 weeks Follow as directed with nephrology and vascular surgery    Discharge Instructions     Diet general   Complete by: As directed    Renal diet with fluid restriction 1500 mL / 24 h   Discharge wound care:   Complete by: As directed    As needed   Increase activity slowly   Complete by: As directed          Discharge Diagnoses: Principal Problem:   Hyperkalemia Active Problems:   Complication of vascular access for dialysis   ESRD (end stage renal disease) (HCC)   Anemia of chronic disease   History of stroke   Type 2 diabetes mellitus with hyperlipidemia (HCC)   Arteriovenous fistula occlusion (HCC)   Obesity (BMI 30-39.9)   Hyponatremia   Thrombocytopenia (HCC)   Complication of vascular access for dialysis, sequela      Hospital course / significant events:   HPI: 73 y.o. female with medical history significant for HFpEF, ESRD on HD MWF, DDD, depression, HTN, DDD, OSA, CAD, obesity, GERD, T2DM and CVA who presented to the ED for evaluation of vascular access problem.  Patient reports she went to HD on Friday and they were unable to dialyze her due to clotting of her LUE AVF.  She returned on Saturday and they were able to complete dialysis.  During her next HD session this morning, they were unable to cannulate the access site and there was no thrill or bruit.  Patient was sent to the ER for declotting of her AVF by vascular surgery.  05/12: Patient was sent to the Cath Lab  for thrombectomy however after the BMP was obtained, patient was found to be hyperkalemic with potassium of 6.5.  Nephrology was consulted for emergent dialysis and TRH was consulted for admission.  A temporary HD catheter was placed in the right femoral vein.   05/13:  Vascular surgery scheduled a declot procedure for Friday 05/16.  Will continue Eliquis  and aspirin .  Continue dialysis through temporary catheter.  Today's potassium 3.8. 05/14-05/15: stable, awaiting vascular procedure 05/16: underwent LUE AV graft cannulation and thrombolysis/thrombectomy, balloon angioplasty - resolution of the thrombus with only the residual stenosis in the mid graft improved w/ ballon to only about 10% residual stenosis w/ good flow thru the graft. Dialysis this evening. Facility states can take her back over the weekend, if doing well anticipate d/c tomorrow  05/17: doing well post-procedure, successful dialysis yesterday evening, stable for discharge      Consultants:  Vascular surgery Nephrology   Procedures/Surgeries: 09/19/23: placement triple lumen dialysis catheter right femoral vein       ASSESSMENT & PLAN:   Hyperkalemia Potassium improved  Monitor BMP Dialysis per nephrology   Complication of vascular access for dialysis: Occluded left fistula.   S/p LUE AV graft cannulation and thrombolysis/thrombectomy, balloon angioplasty - resolution of the thrombus and good blood flow thru graft  Continue aspirin  and Eliquis .   Hypotension Recommend to NOT administer opiates  if BP <110/70 Holding antihypertensives Prn torsemide  NOT daily  ESRD (end stage renal disease)  Continue dialysis through temporary catheter.   Thrombocytopenia  Platelet count has gone up and down in the past.   Check hepatitis C Monitor CBC   Hyponatremia, chronic  Continue with dialysis   Type 2 diabetes mellitus  SSI Hyperlipidemia Lipitor .     History of stroke On aspirin  Plavix and Lipitor    Anemia  of chronic disease Last hemoglobin 8.1, last ferritin 678 Monitor CBC    Class 2 obesity based on BMI: Body mass index is 36.22 kg/m.[name] Significantly low or high BMI is associated with higher medical risk.  Underweight - under 18  overweight - 25 to 29 obese - 30 or more Class 1 obesity: BMI of 30.0 to 34 Class 2 obesity: BMI of 35.0 to 39 Class 3 obesity: BMI of 40.0 to 49 Super Morbid Obesity: BMI 50-59 Super-super Morbid Obesity: BMI 60+ Healthy nutrition and physical activity advised as adjunct to other disease management and risk reduction treatments           Discharge Instructions  Allergies as of 09/24/2023       Reactions   Sulfamethoxazole-trimethoprim    Other reaction(s): Kidney Disorder Other reaction(s): Kidney Disorder Hyperkalemia, AKI Hyperkalemia, AKI        Medication List     STOP taking these medications    carvedilol  25 MG tablet Commonly known as: COREG    docusate sodium  100 MG capsule Commonly known as: COLACE       TAKE these medications    acetaminophen  325 MG tablet Commonly known as: TYLENOL  Take 2 tablets by mouth every 8 (eight) hours as needed.   albuterol  108 (90 Base) MCG/ACT inhaler Commonly known as: VENTOLIN  HFA Inhale 1-2 puffs into the lungs every 4 (four) hours as needed for wheezing or shortness of breath.   albuterol  (2.5 MG/3ML) 0.083% nebulizer solution Commonly known as: PROVENTIL  Take 2.5 mg by nebulization every 6 (six) hours as needed for wheezing or shortness of breath.   apixaban  5 MG Tabs tablet Commonly known as: ELIQUIS  Take 1 tablet (5 mg total) by mouth 2 (two) times daily.   aspirin  81 MG chewable tablet Chew 81 mg by mouth daily.   atorvastatin  80 MG tablet Commonly known as: LIPITOR  Take 80 mg by mouth daily.   calcitRIOL  0.25 MCG capsule Commonly known as: ROCALTROL  Take 0.25 mcg by mouth daily.   calcium  acetate 667 MG capsule Commonly known as: PHOSLO  Take one capsule by  mouth twice daily with food on Mondays, Wednesdays, and Fridays. Take one capsule 3 times daily on Tuesdays, Thursdays, Saturdays, and Sundays.   diclofenac  Sodium 1 % Gel Commonly known as: VOLTAREN  Apply 2 g topically every 4 (four) hours as needed (pain). Apply bilateral hips   ferrous sulfate  324 (65 Fe) MG Tbec Take 1 tablet by mouth daily.   fluticasone  50 MCG/ACT nasal spray Commonly known as: FLONASE  Place 1 spray into both nostrils daily.   folic acid  1 MG tablet Commonly known as: FOLVITE  Take 1 mg by mouth daily.   Gerhardt's butt cream Crea Apply 1 Application topically 4 (four) times daily as needed for irritation.   Healthy Eyes Supervision 2 Caps Take 1 capsule by mouth 2 (two) times daily.   HYDROcodone -acetaminophen  5-325 MG tablet Commonly known as: NORCO/VICODIN Take 1 tablet by mouth every 6 (six) hours as needed for moderate pain (pain score 4-6) or severe pain (pain score 7-10). What  changed: reasons to take this   insulin  glargine 100 UNIT/ML Solostar Pen Commonly known as: LANTUS  Inject 10 Units into the skin at bedtime.   insulin  lispro 100 UNIT/ML injection Commonly known as: HUMALOG Inject 2-12 Units into the skin in the morning, at noon, and at bedtime. Per sliding scale   latanoprost  0.005 % ophthalmic solution Commonly known as: XALATAN  Place 1 drop into both eyes at bedtime.   lidocaine -prilocaine  cream Commonly known as: EMLA  Apply 1 application  topically 3 (three) times a week. On mon, wed, and friday   loratadine  10 MG tablet Commonly known as: CLARITIN  Take 10 mg by mouth daily.   magnesium  hydroxide 400 MG/5ML suspension Commonly known as: MILK OF MAGNESIA Take 30 mLs by mouth daily as needed for mild constipation.   montelukast  10 MG tablet Commonly known as: SINGULAIR  Take 10 mg by mouth daily.   omeprazole 10 MG capsule Commonly known as: PRILOSEC Take 10 mg by mouth daily.   Oyster Shell Calcium  w/D 500-5 MG-MCG  Tabs Take 1 tablet by mouth every Tuesday, Thursday, Saturday, and Sunday.   Ozempic (2 MG/DOSE) 8 MG/3ML Sopn Generic drug: Semaglutide (2 MG/DOSE) Inject 8 mg into the skin once a week. Friday   phenylephrine  0.25 % suppository Commonly known as: (USE for PREPARATION-H) Place 1 suppository rectally 2 (two) times daily as needed for hemorrhoids.   polyethylene glycol 17 g packet Commonly known as: MIRALAX  / GLYCOLAX  Take 17 g by mouth daily.   pregabalin  25 MG capsule Commonly known as: LYRICA  Take 1 capsule (25 mg total) by mouth daily.   senna-docusate 8.6-50 MG tablet Commonly known as: Senokot-S Take 2 tablets by mouth at bedtime as needed for mild constipation.   torsemide  10 MG tablet Commonly known as: DEMADEX  Take 1 tablet (10 mg total) by mouth daily as needed. 1 tablet (10 mg total) by mouth daily as needed for up to 3 days for increased leg swelling, shortness of breath, weight gain 5+ lbs over 1-2 days. Seek medical care if these symptoms are not improving What changed:  when to take this reasons to take this additional instructions               Discharge Care Instructions  (From admission, onward)           Start     Ordered   09/24/23 0000  Discharge wound care:       Comments: As needed   09/24/23 1418              Allergies  Allergen Reactions   Sulfamethoxazole-Trimethoprim     Other reaction(s): Kidney Disorder Other reaction(s): Kidney Disorder Hyperkalemia, AKI Hyperkalemia, AKI      Subjective: pt feeling well this morning on rounds no complaints, tolerating diet, no chest pain or SOB, no dizziness/weakness   Discharge Exam: BP (!) 110/52 (BP Location: Right Arm)   Pulse 95   Temp 98.7 F (37.1 C) (Oral)   Resp 18   Ht 5\' 4"  (1.626 m)   Wt 132.5 kg   SpO2 100%   BMI 50.14 kg/m  General: Pt is alert, awake, not in acute distress Cardiovascular: RRR, S1/S2 Respiratory: CTA bilaterally, no wheezing, no  rhonchi Abdominal: Soft, NT, ND Extremities: trace/stable LE edema, no cyanosis     The results of significant diagnostics from this hospitalization (including imaging, microbiology, ancillary and laboratory) are listed below for reference.     Microbiology: No results found for this or any previous  visit (from the past 240 hours).   Labs: BNP (last 3 results) No results for input(s): "BNP" in the last 8760 hours. Basic Metabolic Panel: Recent Labs  Lab 09/20/23 0028 09/20/23 0222 09/21/23 0538 09/22/23 0514 09/23/23 1529  NA 135 133* 135 135 134*  K 3.8 3.8 4.5 4.1 4.6  CL 101 99 102 99 98  CO2 27 26 26 28 27   GLUCOSE 104* 91 85 143* 164*  BUN 19 20 31* 19 31*  CREATININE 5.70* 5.90* 7.65* 4.84* 7.12*  CALCIUM  8.0* 8.1* 8.5* 8.3* 8.1*  MG  --  2.3  --   --   --   PHOS  --  3.5  --   --  5.0*   Liver Function Tests: Recent Labs  Lab 09/20/23 0222 09/23/23 1529  ALBUMIN  2.5* 2.5*   No results for input(s): "LIPASE", "AMYLASE" in the last 168 hours. No results for input(s): "AMMONIA" in the last 168 hours. CBC: Recent Labs  Lab 09/19/23 1407 09/20/23 0222 09/21/23 0538 09/22/23 0514 09/23/23 1529  WBC 5.4 4.5 4.1 5.8 6.8  HGB 9.4* 8.1* 9.0* 8.5* 9.7*  HCT 31.6* 26.5* 29.7* 26.8* 30.6*  MCV 101.3* 100.8* 101.0* 98.5 98.4  PLT 115* 135* 135* 125* 121*   Cardiac Enzymes: No results for input(s): "CKTOTAL", "CKMB", "CKMBINDEX", "TROPONINI" in the last 168 hours. BNP: Invalid input(s): "POCBNP" CBG: Recent Labs  Lab 09/23/23 0804 09/23/23 1133 09/23/23 2113 09/24/23 0724 09/24/23 1122  GLUCAP 103* 86 86 119* 157*   D-Dimer No results for input(s): "DDIMER" in the last 72 hours. Hgb A1c No results for input(s): "HGBA1C" in the last 72 hours. Lipid Profile No results for input(s): "CHOL", "HDL", "LDLCALC", "TRIG", "CHOLHDL", "LDLDIRECT" in the last 72 hours. Thyroid  function studies No results for input(s): "TSH", "T4TOTAL", "T3FREE", "THYROIDAB"  in the last 72 hours.  Invalid input(s): "FREET3" Anemia work up No results for input(s): "VITAMINB12", "FOLATE", "FERRITIN", "TIBC", "IRON ", "RETICCTPCT" in the last 72 hours. Urinalysis No results found for: "COLORURINE", "APPEARANCEUR", "LABSPEC", "PHURINE", "GLUCOSEU", "HGBUR", "BILIRUBINUR", "KETONESUR", "PROTEINUR", "UROBILINOGEN", "NITRITE", "LEUKOCYTESUR" Sepsis Labs Recent Labs  Lab 09/20/23 0222 09/21/23 0538 09/22/23 0514 09/23/23 1529  WBC 4.5 4.1 5.8 6.8   Microbiology No results found for this or any previous visit (from the past 240 hours). Imaging PERIPHERAL VASCULAR CATHETERIZATION Result Date: 09/19/2023 See surgical note for result.     Time coordinating discharge: over 30 minutes  SIGNED:  Theodis Kinsel DO [facility]

## 2023-09-24 NOTE — TOC Progression Note (Signed)
 Transition of Care [facility]) - Progression Note    Patient Details  Name: Jody Nelson MRN: [id number] Date of Birth: [birth year]  Transition of Care [facility]) CM/SW Contact  [place] L Jacey Pelc, [place] Phone Number: 09/24/2023, 2:51 PM  Clinical Narrative:     CSW spoke with patient daughter and provided an update regarding discharge and transport to [facility].        Expected Discharge Plan and Services         Expected Discharge Date: 09/24/23                                     Social Determinants of Health (SDOH) Interventions SDOH Screenings   Food Insecurity: No Food Insecurity (09/19/2023)  Housing: Low Risk  (09/19/2023)  Transportation Needs: No Transportation Needs (09/19/2023)  Utilities: Not At Risk (09/19/2023)  Financial Resource Strain: Low Risk  (07/07/2023)   Received from [facility]  Physical Activity: Unknown (07/17/2017)   Received from [facility], [facility]  Social Connections: Moderately Isolated (09/19/2023)  Stress: No Stress Concern Present (11/01/2018)   Received from [facility]  Tobacco Use: Medium Risk (09/19/2023)    Readmission Risk Interventions     No data to display

## 2023-09-25 NOTE — Progress Notes (Signed)
 Patient alert and oriented x 4,no acute distress EMS at bedside,patient  discharge to [facility]

## 2023-09-26 ENCOUNTER — Encounter: Payer: Self-pay | Admitting: Vascular Surgery

## 2023-10-31 ENCOUNTER — Encounter (INDEPENDENT_AMBULATORY_CARE_PROVIDER_SITE_OTHER): Payer: 59

## 2023-10-31 ENCOUNTER — Ambulatory Visit (INDEPENDENT_AMBULATORY_CARE_PROVIDER_SITE_OTHER): Payer: 59 | Admitting: Vascular Surgery

## 2023-11-01 ENCOUNTER — Other Ambulatory Visit (INDEPENDENT_AMBULATORY_CARE_PROVIDER_SITE_OTHER): Payer: Self-pay | Admitting: Vascular Surgery

## 2023-11-01 DIAGNOSIS — N186 End stage renal disease: Secondary | ICD-10-CM

## 2023-11-02 NOTE — Progress Notes (Signed)
 MRN : [id number]  Jody Nelson is a 73 y.o. (Sep 29, 1950) female who presents with chief complaint of check access.  History of Present Illness:   The patient returns to the office for followup of their dialysis access.   The patient reports the function of the access has been stable. Patient denies difficulty with cannulation. The patient denies increased bleeding time after removing the needles. The patient denies hand pain or other symptoms consistent with steal phenomena.  No significant arm swelling.  The patient denies any complaints from the dialysis center or their nephrologist.  The patient denies redness or swelling at the access site. The patient denies fever or chills at home or while on dialysis.  No recent shortening of the patient's walking distance or new symptoms consistent with claudication.  No history of rest pain symptoms. No new ulcers or wounds of the lower extremities have occurred.  The patient denies amaurosis fugax or recent TIA symptoms. There are no recent neurological changes noted. There is no history of DVT, PE or superficial thrombophlebitis. No recent episodes of angina or shortness of breath documented.   Duplex ultrasound of the AV access shows a patent access.  The previously noted stenosis is not significantly changed compared to last study.  Flow volume today is 845 cc/min (previous flow volume was 848 cc/min)    No outpatient medications have been marked as taking for the 11/03/23 encounter (Appointment) with Jama, Cordella MATSU, MD.    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   At high risk for falls    Atypical chest pain    Bilateral lower extremity edema    Chronic right sided weakness    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   a.) MRI brain 02/27/2011: old lacunar infarct in RIGHT pons and medulla oblongata   Cryptogenic stroke (HCC) 10/11/2012   a.) CT brain 10/11/2012: tiny old lacunar infarct in head of caudate nucleus (left)   Cryptogenic stroke (HCC) 10/12/2012   a.) MRI brain 10/12/2012: multiple acute LEFT hemispheric infarcts   Cryptogenic stroke (HCC) 06/26/2014   a.) MRI brain 06/26/2014: acute RIGHT posterior frontal lobe infarct involving both and grey matter   Cryptogenic stroke (HCC) 07/29/2015   a.) MRI brain 07/29/2015: 2 foci of diffusion restriction in the LEFT parietal lobe consistent with acute infarction   Cryptogenic stroke (HCC) 07/24/2017   a.) MRI brain 07/24/2017: punctate focus of acute ischemia in the medial LEFT temporal lobe   DDD (degenerative disc disease), cervical    Depression    DOE (dyspnea on exertion)    Dysarthria as late effect of stroke    ESRD (end stage renal disease) on dialysis (HCC)    a.) M-W-F   GERD (gastroesophageal reflux disease)    Hallux valgus, bilateral    Heterozygous factor V Leiden mutation (HCC) 07/18/2018   History of cardiac catheterization 08/30/2008   a.) LHC 08/30/2008: EF >60%, LVEDP 18 mmHg,  normal coronaries.   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   a.) 07/18/2018 --> 22 mol/L   Hypertension    Insomnia    a.) melatonin + trazodone  PRN   Long term current use of anticoagulant    a.) apixaban    Lymphedema of both lower extremities     Meralgia paresthetica, left lower limb    OSA (obstructive sleep apnea)    a.) does not require nocturnal PAP therapy   Osteoarthritis    Pneumonia    Pseudophakia of both eyes    Sarcoidosis    Secondary hyperparathyroidism of renal origin (HCC)    Type 2 diabetes mellitus treated with insulin  (HCC)    Vitamin D deficiency     Past Surgical History:  Procedure Laterality Date   A/V FISTULAGRAM Left 12/28/2022   Procedure: A/V Fistulagram;  Surgeon: Jama Cordella MATSU, MD;  Location: [facility];  Service: Cardiovascular;  Laterality: Left;   APPLICATION OF WOUND VAC Right 06/15/2022   Procedure: APPLICATION OF WOUND VAC;  Surgeon: Tobie Priest, MD;  Location: [facility];  Service: Orthopedics;  Laterality: Right;   AV FISTULA PLACEMENT Left 04/16/2022   Procedure: INSERTION OF ARTERIOVENOUS (AV) GORE-TEX GRAFT ARM (BRACHIAL AXILLARY);  Surgeon: Jama Cordella MATSU, MD;  Location: [facility];  Service: Vascular;  Laterality: Left;   CESAREAN SECTION     DIALYSIS/PERMA CATHETER INSERTION N/A 10/14/2021   Procedure: DIALYSIS/PERMA CATHETER INSERTION;  Surgeon: Marea Selinda RAMAN, MD;  Location: [facility];  Service: Cardiovascular;  Laterality: N/A;   DIALYSIS/PERMA CATHETER INSERTION N/A 03/14/2023   Procedure: DIALYSIS/PERMA CATHETER INSERTION;  Surgeon: Marea Selinda RAMAN, MD;  Location: [facility];  Service: Cardiovascular;  Laterality: N/A;   DIALYSIS/PERMA CATHETER REMOVAL N/A 08/02/2022   Procedure: DIALYSIS/PERMA CATHETER REMOVAL;  Surgeon: Marea Selinda RAMAN, MD;  Location: [facility];  Service: Cardiovascular;  Laterality: N/A;   DIALYSIS/PERMA CATHETER REMOVAL N/A 06/27/2023   Procedure: DIALYSIS/PERMA CATHETER REMOVAL;  Surgeon: Marea Selinda RAMAN, MD;  Location: [facility];  Service: Cardiovascular;  Laterality: N/A;   FRACTURE SURGERY     leg right fracture several surgeries, rods   INCISION AND DRAINAGE OF WOUND Right 06/15/2022   Procedure: IRRIGATION AND  DEBRIDEMENT WOUND;  Surgeon: Tobie Priest, MD;  Location: [facility];  Service: Orthopedics;  Laterality: Right;   PERIPHERAL VASCULAR THROMBECTOMY N/A 09/23/2023   Procedure: PERIPHERAL VASCULAR THROMBECTOMY;  Surgeon: Marea Selinda RAMAN, MD;  Location: [facility];  Service: Cardiovascular;  Laterality: N/A;   TEMPORARY DIALYSIS CATHETER N/A 09/19/2023   Procedure: TEMPORARY DIALYSIS CATHETER;  Surgeon: Marea Selinda RAMAN, MD;  Location: [facility];  Service: Cardiovascular;  Laterality: N/A;    Social History Social History   Tobacco Use   Smoking status: Former    Current packs/day: 0.00    Types: Cigarettes    Quit date: 2020    Years since quitting: 5.4   Smokeless tobacco: Never  Substance Use Topics   Alcohol use: Never   Drug use: Never    Family History Family History  Problem Relation Age of Onset   Diabetes Mother    Colon cancer Sister    Diabetes Sister    Cancer Maternal Uncle    Clotting disorder Paternal  Grandmother    Colon cancer Paternal Grandfather     Allergies  Allergen Reactions   Sulfamethoxazole-Trimethoprim     Other reaction(s): Kidney Disorder Other reaction(s): Kidney Disorder Hyperkalemia, AKI Hyperkalemia, AKI      REVIEW OF SYSTEMS (Negative unless checked)  Constitutional: [] Weight loss  [] Fever  [] Chills Cardiac: [] Chest pain   [] Chest pressure   [] Palpitations   [] Shortness of breath when laying flat   [] Shortness of breath with exertion. Vascular:  [] Pain in legs with walking   [] Pain in legs at rest  [] History of DVT   [] Phlebitis   [] Swelling in legs   [] Varicose veins   [] Non-healing ulcers Pulmonary:   [] Uses home oxygen   [] Productive cough   [] Hemoptysis   [] Wheeze  [] COPD   [] Asthma Neurologic:  [] Dizziness   [] Seizures   [] History of stroke   [] History of TIA  [] Aphasia   [] Vissual changes   [] Weakness or numbness in arm   [] Weakness or numbness in leg Musculoskeletal:   [] Joint swelling   [] Joint pain   [] Low back  pain Hematologic:  [] Easy bruising  [] Easy bleeding   [] Hypercoagulable state   [] Anemic Gastrointestinal:  [] Diarrhea   [] Vomiting  [] Gastroesophageal reflux/heartburn   [] Difficulty swallowing. Genitourinary:  [x] Chronic kidney disease   [] Difficult urination  [] Frequent urination   [] Blood in urine Skin:  [] Rashes   [] Ulcers  Psychological:  [] History of anxiety   []  History of major depression.  Physical Examination  There were no vitals filed for this visit. There is no height or weight on file to calculate BMI. Gen: WD/WN, NAD Head: [place]/AT, No temporalis wasting.  Ear/Nose/Throat: Hearing grossly intact, nares w/o erythema or drainage Eyes: PER, EOMI, sclera nonicteric.  Neck: Supple, no gross masses or lesions.  No JVD.  Pulmonary:  Good air movement, no audible wheezing, no use of accessory muscles.  Cardiac: RRR, precordium non-hyperdynamic. Vascular:   left arm access good thrill good bruit Vessel Right Left  Radial Palpable Palpable  Brachial Palpable Palpable  Gastrointestinal: soft, non-distended. No guarding/no peritoneal signs.  Musculoskeletal: M/S 5/5 throughout.  No deformity.  Neurologic: CN 2-12 intact. Pain and light touch intact in extremities.  Symmetrical.  Speech is fluent. Motor exam as listed above. Psychiatric: Judgment intact, Mood & affect appropriate for pt's clinical situation. Dermatologic: No rashes or ulcers noted.  No changes consistent with cellulitis.   CBC Lab Results  Component Value Date   WBC 6.8 09/23/2023   HGB 9.7 (L) 09/23/2023   HCT 30.6 (L) 09/23/2023   MCV 98.4 09/23/2023   PLT 121 (L) 09/23/2023    BMET    Component Value Date/Time   NA 134 (L) 09/23/2023 1529   K 4.6 09/23/2023 1529   CL 98 09/23/2023 1529   CO2 27 09/23/2023 1529   GLUCOSE 164 (H) 09/23/2023 1529   BUN 31 (H) 09/23/2023 1529   CREATININE 7.12 (H) 09/23/2023 1529   CALCIUM  8.1 (L) 09/23/2023 1529   GFRNONAA 6 (L) 09/23/2023 1529   CrCl cannot be  calculated (Patient's most recent lab result is older than the maximum 21 days allowed.).  COAG Lab Results  Component Value Date   INR 1.6 (H) 03/13/2023   INR 1.2 07/22/2022   INR 1.8 (H) 03/02/2022    Radiology No results found.   Assessment/Plan 1. ESRD on hemodialysis (HCC) (Primary) Recommend:  The patient is doing well and currently has adequate dialysis access. The patient's dialysis center is not reporting any access issues. Flow pattern  is stable when compared to the prior ultrasound.  The patient should have a duplex ultrasound of the dialysis access in 6 months. The patient will follow-up with me in the office after each ultrasound   - VAS US  DUPLEX DIALYSIS ACCESS (AVF, AVG); Future  2. Essential hypertension Continue antihypertensive medications as already ordered, these medications have been reviewed and there are no changes at this time.  3. Acute CVA (cerebrovascular accident) (HCC) Continue antiplatelet therapy no changes at this time  4. Gastroesophageal reflux disease, unspecified whether esophagitis present Continue PPI as already ordered, this medication has been reviewed and there are no changes at this time.  Avoidence of caffeine and alcohol  Moderate elevation of the head of the bed   5. Diabetes mellitus with insulin  therapy (HCC) Continue hypoglycemic medications as already ordered, these medications have been reviewed and there are no changes at this time.  Hgb A1C to be monitored as already arranged by primary service    Cordella Shawl, MD  11/02/2023 1:20 PM

## 2023-11-03 ENCOUNTER — Ambulatory Visit (INDEPENDENT_AMBULATORY_CARE_PROVIDER_SITE_OTHER): Payer: 59

## 2023-11-03 ENCOUNTER — Ambulatory Visit (INDEPENDENT_AMBULATORY_CARE_PROVIDER_SITE_OTHER): Payer: 59 | Admitting: Vascular Surgery

## 2023-11-03 VITALS — BP 136/83 | HR 82 | Ht 64.0 in | Wt 292.0 lb

## 2023-11-03 DIAGNOSIS — Z992 Dependence on renal dialysis: Secondary | ICD-10-CM

## 2023-11-03 DIAGNOSIS — N186 End stage renal disease: Secondary | ICD-10-CM

## 2023-11-03 DIAGNOSIS — K219 Gastro-esophageal reflux disease without esophagitis: Secondary | ICD-10-CM | POA: Diagnosis not present

## 2023-11-03 DIAGNOSIS — E119 Type 2 diabetes mellitus without complications: Secondary | ICD-10-CM

## 2023-11-03 DIAGNOSIS — I639 Cerebral infarction, unspecified: Secondary | ICD-10-CM | POA: Diagnosis not present

## 2023-11-03 DIAGNOSIS — I10 Essential (primary) hypertension: Secondary | ICD-10-CM

## 2023-11-03 DIAGNOSIS — Z794 Long term (current) use of insulin: Secondary | ICD-10-CM

## 2023-11-06 ENCOUNTER — Encounter (INDEPENDENT_AMBULATORY_CARE_PROVIDER_SITE_OTHER): Payer: Self-pay | Admitting: Vascular Surgery

## 2024-01-30 ENCOUNTER — Inpatient Hospital Stay
Admission: EM | Admit: 2024-01-30 | Discharge: 2024-02-03 | DRG: 252 | Disposition: A | Discharge status: Skilled Nursing Facility | Source: Skilled Nursing Facility | Attending: Internal Medicine | Admitting: Internal Medicine

## 2024-01-30 ENCOUNTER — Other Ambulatory Visit: Payer: Self-pay

## 2024-01-30 DIAGNOSIS — D631 Anemia in chronic kidney disease: Secondary | ICD-10-CM | POA: Diagnosis present

## 2024-01-30 DIAGNOSIS — T82510A Breakdown (mechanical) of surgically created arteriovenous fistula, initial encounter: Secondary | ICD-10-CM | POA: Diagnosis present

## 2024-01-30 DIAGNOSIS — N186 End stage renal disease: Secondary | ICD-10-CM | POA: Diagnosis present

## 2024-01-30 DIAGNOSIS — E1140 Type 2 diabetes mellitus with diabetic neuropathy, unspecified: Secondary | ICD-10-CM | POA: Diagnosis present

## 2024-01-30 DIAGNOSIS — D6852 Prothrombin gene mutation: Secondary | ICD-10-CM | POA: Diagnosis present

## 2024-01-30 DIAGNOSIS — E785 Hyperlipidemia, unspecified: Secondary | ICD-10-CM | POA: Diagnosis present

## 2024-01-30 DIAGNOSIS — G4733 Obstructive sleep apnea (adult) (pediatric): Secondary | ICD-10-CM | POA: Diagnosis present

## 2024-01-30 DIAGNOSIS — T82898A Other specified complication of vascular prosthetic devices, implants and grafts, initial encounter: Secondary | ICD-10-CM | POA: Diagnosis not present

## 2024-01-30 DIAGNOSIS — T82868A Thrombosis of vascular prosthetic devices, implants and grafts, initial encounter: Secondary | ICD-10-CM | POA: Diagnosis not present

## 2024-01-30 DIAGNOSIS — I69322 Dysarthria following cerebral infarction: Secondary | ICD-10-CM

## 2024-01-30 DIAGNOSIS — N2581 Secondary hyperparathyroidism of renal origin: Secondary | ICD-10-CM | POA: Diagnosis present

## 2024-01-30 DIAGNOSIS — Z992 Dependence on renal dialysis: Secondary | ICD-10-CM

## 2024-01-30 DIAGNOSIS — E875 Hyperkalemia: Secondary | ICD-10-CM | POA: Diagnosis not present

## 2024-01-30 DIAGNOSIS — E1122 Type 2 diabetes mellitus with diabetic chronic kidney disease: Secondary | ICD-10-CM | POA: Diagnosis present

## 2024-01-30 DIAGNOSIS — Z961 Presence of intraocular lens: Secondary | ICD-10-CM | POA: Diagnosis present

## 2024-01-30 DIAGNOSIS — Z7901 Long term (current) use of anticoagulants: Secondary | ICD-10-CM

## 2024-01-30 DIAGNOSIS — Z832 Family history of diseases of the blood and blood-forming organs and certain disorders involving the immune mechanism: Secondary | ICD-10-CM

## 2024-01-30 DIAGNOSIS — M5030 Other cervical disc degeneration, unspecified cervical region: Secondary | ICD-10-CM | POA: Diagnosis present

## 2024-01-30 DIAGNOSIS — G4700 Insomnia, unspecified: Secondary | ICD-10-CM | POA: Diagnosis present

## 2024-01-30 DIAGNOSIS — I132 Hypertensive heart and chronic kidney disease with heart failure and with stage 5 chronic kidney disease, or end stage renal disease: Secondary | ICD-10-CM | POA: Diagnosis present

## 2024-01-30 DIAGNOSIS — Z882 Allergy status to sulfonamides status: Secondary | ICD-10-CM

## 2024-01-30 DIAGNOSIS — D696 Thrombocytopenia, unspecified: Secondary | ICD-10-CM | POA: Diagnosis present

## 2024-01-30 DIAGNOSIS — Z7982 Long term (current) use of aspirin: Secondary | ICD-10-CM

## 2024-01-30 DIAGNOSIS — Z87891 Personal history of nicotine dependence: Secondary | ICD-10-CM

## 2024-01-30 DIAGNOSIS — Y832 Surgical operation with anastomosis, bypass or graft as the cause of abnormal reaction of the patient, or of later complication, without mention of misadventure at the time of the procedure: Secondary | ICD-10-CM | POA: Diagnosis present

## 2024-01-30 DIAGNOSIS — D6851 Activated protein C resistance: Secondary | ICD-10-CM | POA: Diagnosis present

## 2024-01-30 DIAGNOSIS — Z833 Family history of diabetes mellitus: Secondary | ICD-10-CM

## 2024-01-30 DIAGNOSIS — Z7985 Long-term (current) use of injectable non-insulin antidiabetic drugs: Secondary | ICD-10-CM

## 2024-01-30 DIAGNOSIS — Z6841 Body Mass Index (BMI) 40.0 and over, adult: Secondary | ICD-10-CM

## 2024-01-30 DIAGNOSIS — I5032 Chronic diastolic (congestive) heart failure: Secondary | ICD-10-CM | POA: Diagnosis present

## 2024-01-30 DIAGNOSIS — Z79899 Other long term (current) drug therapy: Secondary | ICD-10-CM

## 2024-01-30 DIAGNOSIS — K219 Gastro-esophageal reflux disease without esophagitis: Secondary | ICD-10-CM | POA: Diagnosis present

## 2024-01-30 DIAGNOSIS — Z794 Long term (current) use of insulin: Secondary | ICD-10-CM

## 2024-01-30 LAB — MRSA NEXT GEN BY PCR, NASAL: MRSA by PCR Next Gen: NOT DETECTED

## 2024-01-30 LAB — CBC
HCT: 36.8 % (ref 36.0–46.0)
Hemoglobin: 11.5 g/dL — ABNORMAL LOW (ref 12.0–15.0)
MCH: 29.8 pg (ref 26.0–34.0)
MCHC: 31.3 g/dL (ref 30.0–36.0)
MCV: 95.3 fL (ref 80.0–100.0)
Platelets: 81 K/uL — ABNORMAL LOW (ref 150–400)
RBC: 3.86 MIL/uL — ABNORMAL LOW (ref 3.87–5.11)
RDW: 16.6 % — ABNORMAL HIGH (ref 11.5–15.5)
WBC: 5.4 K/uL (ref 4.0–10.5)
nRBC: 0 % (ref 0.0–0.2)

## 2024-01-30 LAB — COMPREHENSIVE METABOLIC PANEL WITH GFR
ALT: 6 U/L (ref 0–44)
AST: 13 U/L — ABNORMAL LOW (ref 15–41)
Albumin: 3.2 g/dL — ABNORMAL LOW (ref 3.5–5.0)
Alkaline Phosphatase: 52 U/L (ref 38–126)
Anion gap: 12 (ref 5–15)
BUN: 51 mg/dL — ABNORMAL HIGH (ref 8–23)
CO2: 24 mmol/L (ref 22–32)
Calcium: 9.9 mg/dL (ref 8.9–10.3)
Chloride: 100 mmol/L (ref 98–111)
Creatinine, Ser: 11.59 mg/dL — ABNORMAL HIGH (ref 0.44–1.00)
GFR, Estimated: 3 mL/min — ABNORMAL LOW (ref >60)
Glucose, Bld: 97 mg/dL (ref 70–99)
Potassium: 5.9 mmol/L — ABNORMAL HIGH (ref 3.5–5.1)
Sodium: 136 mmol/L (ref 135–145)
Total Bilirubin: 1.2 mg/dL (ref 0.0–1.2)
Total Protein: 7.1 g/dL (ref 6.5–8.1)

## 2024-01-30 LAB — BASIC METABOLIC PANEL WITH GFR
Anion gap: 11 (ref 5–15)
BUN: 57 mg/dL — ABNORMAL HIGH (ref 8–23)
CO2: 23 mmol/L (ref 22–32)
Calcium: 9.8 mg/dL (ref 8.9–10.3)
Chloride: 102 mmol/L (ref 98–111)
Creatinine, Ser: 11.91 mg/dL — ABNORMAL HIGH (ref 0.44–1.00)
GFR, Estimated: 3 mL/min — ABNORMAL LOW (ref >60)
Glucose, Bld: 145 mg/dL — ABNORMAL HIGH (ref 70–99)
Potassium: 5.3 mmol/L — ABNORMAL HIGH (ref 3.5–5.1)
Sodium: 136 mmol/L (ref 135–145)

## 2024-01-30 LAB — GLUCOSE, CAPILLARY
Glucose-Capillary: 68 mg/dL — ABNORMAL LOW (ref 70–99)
Glucose-Capillary: 91 mg/dL (ref 70–99)
Glucose-Capillary: 131 mg/dL — ABNORMAL HIGH (ref 70–99)

## 2024-01-30 LAB — HEPATITIS B SURFACE ANTIGEN: Hepatitis B Surface Ag: NONREACTIVE

## 2024-01-30 MED ORDER — SODIUM ZIRCONIUM CYCLOSILICATE 10 G PO PACK
10.0000 g | PACK | Freq: Once | ORAL | Status: AC
  Administered 2024-01-30: 10 g via ORAL
  Filled 2024-01-30: qty 1

## 2024-01-30 MED ORDER — CALCIUM GLUCONATE-NACL 1-0.675 GM/50ML-% IV SOLN
1.0000 g | Freq: Once | INTRAVENOUS | Status: AC
  Administered 2024-01-30: 1000 mg via INTRAVENOUS
  Filled 2024-01-30: qty 50

## 2024-01-30 MED ORDER — PANTOPRAZOLE SODIUM 40 MG PO TBEC
40.0000 mg | DELAYED_RELEASE_TABLET | Freq: Every day | ORAL | Status: DC
  Administered 2024-02-01 – 2024-02-03 (×2): 40 mg via ORAL
  Filled 2024-01-30 (×3): qty 1

## 2024-01-30 MED ORDER — INSULIN ASPART 100 UNIT/ML IV SOLN
5.0000 [IU] | Freq: Once | INTRAVENOUS | Status: AC
  Administered 2024-01-30: 5 [IU] via INTRAVENOUS
  Filled 2024-01-30: qty 0.05

## 2024-01-30 MED ORDER — CALCITRIOL 0.25 MCG PO CAPS
0.2500 ug | ORAL_CAPSULE | Freq: Every day | ORAL | Status: DC
  Administered 2024-01-30 – 2024-02-03 (×4): 0.25 ug via ORAL
  Filled 2024-01-30 (×5): qty 1

## 2024-01-30 MED ORDER — ASPIRIN 81 MG PO CHEW
81.0000 mg | CHEWABLE_TABLET | Freq: Every day | ORAL | Status: DC
Start: 2024-01-30 — End: 2024-01-30

## 2024-01-30 MED ORDER — POLYETHYLENE GLYCOL 3350 17 G PO PACK
17.0000 g | PACK | Freq: Every day | ORAL | Status: DC
  Administered 2024-01-30 – 2024-02-03 (×5): 17 g via ORAL
  Filled 2024-01-30 (×5): qty 1

## 2024-01-30 MED ORDER — INSULIN ASPART 100 UNIT/ML IJ SOLN: 0.0000 [IU] | Freq: Every day | INTRAMUSCULAR | Status: DC

## 2024-01-30 MED ORDER — FERROUS SULFATE 325 (65 FE) MG PO TABS
325.0000 mg | ORAL_TABLET | Freq: Every day | ORAL | Status: DC
  Administered 2024-01-30 – 2024-02-03 (×5): 325 mg via ORAL
  Filled 2024-01-30 (×5): qty 1

## 2024-01-30 MED ORDER — SODIUM CHLORIDE 0.9% FLUSH
3.0000 mL | Freq: Two times a day (BID) | INTRAVENOUS | Status: DC
  Administered 2024-01-30 – 2024-02-03 (×6): 3 mL via INTRAVENOUS

## 2024-01-30 MED ORDER — DEXTROSE 50 % IV SOLN
1.0000 | Freq: Once | INTRAVENOUS | Status: AC
Start: 2024-01-30 — End: 2024-01-30
  Administered 2024-01-30: 50 mL via INTRAVENOUS
  Filled 2024-01-30: qty 50

## 2024-01-30 MED ORDER — ALBUTEROL SULFATE HFA 108 (90 BASE) MCG/ACT IN AERS: 1.0000 | INHALATION_SPRAY | RESPIRATORY_TRACT | Status: DC | PRN

## 2024-01-30 MED ORDER — PREGABALIN 25 MG PO CAPS
25.0000 mg | ORAL_CAPSULE | Freq: Every day | ORAL | Status: DC
  Administered 2024-01-31 – 2024-02-03 (×4): 25 mg via ORAL
  Filled 2024-01-30 (×4): qty 1

## 2024-01-30 MED ORDER — ALBUTEROL SULFATE (2.5 MG/3ML) 0.083% IN NEBU: 2.5000 mg | INHALATION_SOLUTION | RESPIRATORY_TRACT | Status: DC | PRN

## 2024-01-30 MED ORDER — INSULIN GLARGINE 100 UNIT/ML ~~LOC~~ SOLN
10.0000 [IU] | Freq: Every day | SUBCUTANEOUS | Status: DC
Start: 2024-01-30 — End: 2024-01-30
  Filled 2024-01-30: qty 0.1

## 2024-01-30 MED ORDER — PATIROMER SORBITEX CALCIUM 8.4 G PO PACK
25.2000 g | PACK | Freq: Every day | ORAL | Status: AC
  Administered 2024-01-30 – 2024-01-31 (×2): 25.2 g via ORAL
  Filled 2024-01-30 (×3): qty 3

## 2024-01-30 MED ORDER — HYDROCODONE-ACETAMINOPHEN 5-325 MG PO TABS
1.0000 | ORAL_TABLET | Freq: Four times a day (QID) | ORAL | Status: DC | PRN
  Administered 2024-01-30 – 2024-01-31 (×2): 1 via ORAL
  Filled 2024-01-30 (×3): qty 1

## 2024-01-30 MED ORDER — ACETAMINOPHEN 650 MG RE SUPP: 650.0000 mg | Freq: Four times a day (QID) | RECTAL | Status: DC | PRN

## 2024-01-30 MED ORDER — LATANOPROST 0.005 % OP SOLN
1.0000 [drp] | Freq: Every day | OPHTHALMIC | Status: DC
  Administered 2024-01-30 – 2024-02-02 (×4): 1 [drp] via OPHTHALMIC
  Filled 2024-01-30: qty 2.5

## 2024-01-30 MED ORDER — ATORVASTATIN CALCIUM 20 MG PO TABS
80.0000 mg | ORAL_TABLET | Freq: Every evening | ORAL | Status: DC
  Administered 2024-01-30 – 2024-02-03 (×5): 80 mg via ORAL
  Filled 2024-01-30 (×5): qty 4

## 2024-01-30 MED ORDER — MONTELUKAST SODIUM 10 MG PO TABS
10.0000 mg | ORAL_TABLET | Freq: Every evening | ORAL | Status: DC
  Administered 2024-01-30 – 2024-02-03 (×5): 10 mg via ORAL
  Filled 2024-01-30 (×5): qty 1

## 2024-01-30 MED ORDER — INSULIN ASPART 100 UNIT/ML IJ SOLN
0.0000 [IU] | Freq: Three times a day (TID) | INTRAMUSCULAR | Status: DC
  Administered 2024-02-03: 1 [IU] via SUBCUTANEOUS
  Filled 2024-01-30 (×2): qty 1

## 2024-01-30 MED ORDER — CALCIUM ACETATE (PHOS BINDER) 667 MG PO CAPS
667.0000 mg | ORAL_CAPSULE | Freq: Three times a day (TID) | ORAL | Status: DC
  Administered 2024-01-30 – 2024-02-03 (×9): 667 mg via ORAL
  Filled 2024-01-30 (×10): qty 1

## 2024-01-30 MED ORDER — FOLIC ACID 1 MG PO TABS
1.0000 mg | ORAL_TABLET | Freq: Every day | ORAL | Status: DC
  Administered 2024-01-30 – 2024-02-03 (×5): 1 mg via ORAL
  Filled 2024-01-30 (×5): qty 1

## 2024-01-30 MED ORDER — ACETAMINOPHEN 325 MG PO TABS
650.0000 mg | ORAL_TABLET | Freq: Four times a day (QID) | ORAL | Status: DC | PRN
  Administered 2024-02-01: 650 mg via ORAL

## 2024-01-30 MED ORDER — SODIUM ZIRCONIUM CYCLOSILICATE 10 G PO PACK
10.0000 g | PACK | Freq: Every day | ORAL | Status: AC
  Administered 2024-01-31: 10 g via ORAL
  Filled 2024-01-30: qty 1

## 2024-01-30 MED ORDER — LORATADINE 10 MG PO TABS
10.0000 mg | ORAL_TABLET | Freq: Every day | ORAL | Status: DC
  Administered 2024-01-30 – 2024-02-03 (×5): 10 mg via ORAL
  Filled 2024-01-30 (×5): qty 1

## 2024-01-30 NOTE — Consult Note (Signed)
 Hospital Consult    Reason for Consult:  Malfunction of Dialysis Access  Requesting Physician:  Dr Nilsa Cross MD  MRN #:  [id number]  History of Present Illness: This is a 73 y.o. female with history of ESRD on HD presenting to the emergency department for evaluation of dialysis access problem.  On Friday, patient attended to her HD session, was only able to complete 1 hour as they were having issues accessing her fistula.  Today, the facility reportedly spoke with patient's vascular team and they recommended patient present to the ER.  Last full dialysis session was on Wednesday.  They did not attempt to access patient's site today.   In May the patient presented with the inability to dialyze due to clotted fistula.  Patient initially sent to the vascular lab for thrombectomy but was severely hyperkalemic and a temporary HD catheter was placed.  She later underwent an AV graft cannulation with thrombectomy and angioplasty.  Post this she was able to dialyze and was then discharged home.  Vascular surgery was consulted to reassess patient's AV fistula.  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   At high risk for falls    Atypical chest pain    Bilateral lower extremity edema    Chronic right sided weakness    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   a.) MRI brain 02/27/2011: old lacunar infarct in RIGHT pons and medulla oblongata   Cryptogenic stroke (HCC) 10/11/2012   a.) CT brain 10/11/2012: tiny old lacunar infarct in head of caudate nucleus (left)    Cryptogenic stroke (HCC) 10/12/2012   a.) MRI brain 10/12/2012: multiple acute LEFT hemispheric infarcts   Cryptogenic stroke (HCC) 06/26/2014   a.) MRI brain 06/26/2014: acute RIGHT posterior frontal lobe infarct involving both and grey matter   Cryptogenic stroke (HCC) 07/29/2015   a.) MRI brain 07/29/2015: 2 foci of diffusion restriction in the LEFT parietal lobe consistent with acute infarction   Cryptogenic stroke (HCC) 07/24/2017   a.) MRI brain 07/24/2017: punctate focus of acute ischemia in the medial LEFT temporal lobe   DDD (degenerative disc disease), cervical    Depression    DOE (dyspnea on exertion)    Dysarthria as late effect of stroke    ESRD (end stage renal disease) on dialysis (HCC)    a.) M-W-F   GERD (gastroesophageal reflux disease)    Hallux valgus, bilateral    Heterozygous factor V Leiden mutation (HCC) 07/18/2018   History of cardiac catheterization 08/30/2008   a.) LHC 08/30/2008: EF >60%, LVEDP 18 mmHg, normal coronaries.   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   a.) 07/18/2018 --> 22 mol/L   Hypertension    Insomnia    a.) melatonin + trazodone  PRN   Long term current use of anticoagulant    a.) apixaban    Lymphedema of  both lower extremities    Meralgia paresthetica, left lower limb    OSA (obstructive sleep apnea)    a.) does not require nocturnal PAP therapy   Osteoarthritis    Pneumonia    Pseudophakia of both eyes    Sarcoidosis    Secondary hyperparathyroidism of renal origin (HCC)    Type 2 diabetes mellitus treated with insulin  (HCC)    Vitamin D deficiency     Past Surgical History:  Procedure Laterality Date   A/V FISTULAGRAM Left 12/28/2022   Procedure: A/V Fistulagram;   Surgeon: Jama Cordella MATSU, MD;  Location: [facility];  Service: Cardiovascular;  Laterality: Left;   APPLICATION OF WOUND VAC Right 06/15/2022   Procedure: APPLICATION OF WOUND VAC;  Surgeon: Tobie Priest, MD;  Location: [facility];  Service: Orthopedics;  Laterality: Right;   AV FISTULA PLACEMENT Left 04/16/2022   Procedure: INSERTION OF ARTERIOVENOUS (AV) GORE-TEX GRAFT ARM (BRACHIAL AXILLARY);  Surgeon: Jama Cordella MATSU, MD;  Location: [facility];  Service: Vascular;  Laterality: Left;   CESAREAN SECTION     DIALYSIS/PERMA CATHETER INSERTION N/A 10/14/2021   Procedure: DIALYSIS/PERMA CATHETER INSERTION;  Surgeon: Marea Selinda RAMAN, MD;  Location: [facility];  Service: Cardiovascular;  Laterality: N/A;   DIALYSIS/PERMA CATHETER INSERTION N/A 03/14/2023   Procedure: DIALYSIS/PERMA CATHETER INSERTION;  Surgeon: Marea Selinda RAMAN, MD;  Location: [facility];  Service: Cardiovascular;  Laterality: N/A;   DIALYSIS/PERMA CATHETER REMOVAL N/A 08/02/2022   Procedure: DIALYSIS/PERMA CATHETER REMOVAL;  Surgeon: Marea Selinda RAMAN, MD;  Location: [facility];  Service: Cardiovascular;  Laterality: N/A;   DIALYSIS/PERMA CATHETER REMOVAL N/A 06/27/2023   Procedure: DIALYSIS/PERMA CATHETER REMOVAL;  Surgeon: Marea Selinda RAMAN, MD;  Location: [facility];  Service: Cardiovascular;  Laterality: N/A;   FRACTURE SURGERY     leg right fracture several surgeries, rods   INCISION AND DRAINAGE OF WOUND Right 06/15/2022   Procedure: IRRIGATION AND DEBRIDEMENT WOUND;  Surgeon: Tobie Priest, MD;  Location: [facility];  Service: Orthopedics;  Laterality: Right;   PERIPHERAL VASCULAR THROMBECTOMY N/A 09/23/2023   Procedure: PERIPHERAL VASCULAR THROMBECTOMY;  Surgeon: Marea Selinda RAMAN, MD;  Location: [facility];  Service: Cardiovascular;  Laterality: N/A;   TEMPORARY DIALYSIS CATHETER N/A 09/19/2023   Procedure: TEMPORARY DIALYSIS CATHETER;  Surgeon: Marea Selinda RAMAN, MD;  Location: [facility];   Service: Cardiovascular;  Laterality: N/A;    Allergies  Allergen Reactions   Sulfamethoxazole-Trimethoprim     Other reaction(s): Kidney Disorder Other reaction(s): Kidney Disorder Hyperkalemia, AKI Hyperkalemia, AKI     Prior to Admission medications   Medication Sig Start Date End Date Taking? Authorizing Provider  acetaminophen  (TYLENOL ) 325 MG tablet Take 2 tablets by mouth every 8 (eight) hours as needed.    [provider]  albuterol  (PROVENTIL ) (2.5 MG/3ML) 0.083% nebulizer solution Take 2.5 mg by nebulization every 6 (six) hours as needed for wheezing or shortness of breath. 10/21/22   [provider]  albuterol  (VENTOLIN  HFA) 108 (90 Base) MCG/ACT inhaler Inhale 1-2 puffs into the lungs every 4 (four) hours as needed for wheezing or shortness of breath.    [provider]  apixaban  (ELIQUIS ) 5 MG TABS tablet Take 1 tablet (5 mg total) by mouth 2 (two) times daily. 07/27/22   Maree Hue, MD  aspirin  81 MG chewable tablet Chew 81 mg by mouth daily.    [provider]  atorvastatin  (LIPITOR ) 80 MG tablet Take 80 mg  by mouth daily. 02/19/21   [provider]  calcitRIOL  (ROCALTROL ) 0.25 MCG capsule Take 0.25 mcg by mouth daily. 10/22/21   [provider]  calcium  acetate (PHOSLO ) 667 MG capsule Take one capsule by mouth twice daily with food on Mondays, Wednesdays, and Fridays. Take one capsule 3 times daily on Tuesdays, Thursdays, Saturdays, and Sundays. 06/10/22   [provider]  Calcium  Carb-Cholecalciferol (OYSTER SHELL CALCIUM  W/D) 500-5 MG-MCG TABS Take 1 tablet by mouth every Tuesday, Thursday, Saturday, and Sunday. 02/19/21   [provider]  diclofenac  Sodium (VOLTAREN ) 1 % GEL Apply 2 g topically every 4 (four) hours as needed (pain). Apply bilateral hips    [provider]  ferrous sulfate  324 (65 Fe) MG TBEC Take 1 tablet by mouth daily. 02/19/21   [provider]  fluticasone  (FLONASE )  50 MCG/ACT nasal spray Place 1 spray into both nostrils daily. 10/18/22   [provider]  folic acid  (FOLVITE ) 1 MG tablet Take 1 mg by mouth daily. 02/19/21   [provider]  HYDROcodone -acetaminophen  (NORCO/VICODIN) 5-325 MG tablet Take 1 tablet by mouth every 6 (six) hours as needed for moderate pain (pain score 4-6) or severe pain (pain score 7-10). 09/24/23   Alexander, Natalie, DO  insulin  glargine (LANTUS ) 100 UNIT/ML Solostar Pen Inject 10 Units into the skin at bedtime. 02/17/21   [provider]  insulin  lispro (HUMALOG) 100 UNIT/ML injection Inject 2-12 Units into the skin in the morning, at noon, and at bedtime. Per sliding scale    [provider]  latanoprost  (XALATAN ) 0.005 % ophthalmic solution Place 1 drop into both eyes at bedtime. 02/08/22   [provider]  lidocaine -prilocaine  (EMLA ) cream Apply 1 application  topically 3 (three) times a week. On mon, wed, and friday    [provider]  loratadine  (CLARITIN ) 10 MG tablet Take 10 mg by mouth daily.    [provider]  magnesium  hydroxide (MILK OF MAGNESIA) 400 MG/5ML suspension Take 30 mLs by mouth daily as needed for mild constipation.    [provider]  montelukast  (SINGULAIR ) 10 MG tablet Take 10 mg by mouth daily. 02/19/21   [provider]  Multiple Vitamins-Minerals (HEALTHY EYES SUPERVISION 2) CAPS Take 1 capsule by mouth 2 (two) times daily.    [provider]  Nystatin (GERHARDT'S BUTT CREAM) CREA Apply 1 Application topically 4 (four) times daily as needed for irritation. 09/24/23   Alexander, Natalie, DO  omeprazole (PRILOSEC) 10 MG capsule Take 10 mg by mouth daily. 02/25/22   [provider]  OZEMPIC, 2 MG/DOSE, 8 MG/3ML SOPN Inject 8 mg into the skin once a week. Friday 08/26/23   [provider]  phenylephrine  (,USE FOR PREPARATION-H,) 0.25 % suppository Place 1 suppository rectally 2 (two) times daily as needed  for hemorrhoids. 09/24/23   Alexander, Natalie, DO  polyethylene glycol (MIRALAX  / GLYCOLAX ) 17 g packet Take 17 g by mouth daily.    [provider]  pregabalin  (LYRICA ) 25 MG capsule Take 1 capsule (25 mg total) by mouth daily. 09/24/23   Alexander, Natalie, DO  senna-docusate (SENOKOT-S) 8.6-50 MG tablet Take 2 tablets by mouth at bedtime as needed for mild constipation. 09/24/23   Alexander, Natalie, DO  torsemide  (DEMADEX ) 10 MG tablet Take 1 tablet (10 mg total) by mouth daily as needed. 1 tablet (10 mg total) by mouth daily as needed for up to 3 days for increased leg swelling, shortness of breath, weight gain 5+ lbs over  1-2 days. Seek medical care if these symptoms are not improving 09/24/23   Marsa Edelman, DO    Social History   Socioeconomic History   Marital status: Widowed    Spouse name: Not on file   Number of children: Not on file   Years of education: Not on file   Highest education level: Not on file  Occupational History   Not on file  Tobacco Use   Smoking status: Former    Current packs/day: 0.00    Types: Cigarettes    Quit date: 2020    Years since quitting: 5.7   Smokeless tobacco: Never  Vaping Use   Vaping status: Not on file  Substance and Sexual Activity   Alcohol use: Never   Drug use: Never   Sexual activity: Not on file  Other Topics Concern   Not on file  Social History Narrative   Not on file   Social Drivers of Health   Financial Resource Strain: Low Risk  (07/07/2023)   Received from [facility]   Overall Financial Resource Strain (CARDIA)    Difficulty of Paying Living Expenses: Not hard at all  Food Insecurity: No Food Insecurity (09/19/2023)   Hunger Vital Sign    Worried About Running Out of Food in the Last Year: Never true    Ran Out of Food in the Last Year: Never true  Transportation Needs: No Transportation Needs (09/19/2023)   PRAPARE - Administrator, Civil Service (Medical): No    Lack  of Transportation (Non-Medical): No  Physical Activity: Unknown (07/17/2017)   Received from [facility]   Exercise Vital Sign    Days of Exercise per Week: Patient declined    Minutes of Exercise per Session: Patient declined  Stress: No Stress Concern Present (11/01/2018)   Received from [facility] - Occupational Stress Questionnaire    Feeling of Stress : Not at all  Social Connections: Moderately Isolated (09/19/2023)   Social Connection and Isolation Panel    Frequency of Communication with Friends and Family: Three times a week    Frequency of Social Gatherings with Friends and Family: Once a week    Attends Religious Services: 1 to 4 times per year    Active Member of [employer] or Organizations: No    Attends Banker Meetings: Never    Marital Status: Widowed  Intimate Partner Violence: Not At Risk (09/19/2023)   Humiliation, Afraid, Rape, and Kick questionnaire    Fear of Current or Ex-Partner: No    Emotionally Abused: No    Physically Abused: No    Sexually Abused: No     Family History  Problem Relation Age of Onset   Diabetes Mother    Colon cancer Sister    Diabetes Sister    Cancer Maternal Uncle    Clotting disorder Paternal Grandmother    Colon cancer Paternal Grandfather     ROS: Otherwise negative unless mentioned in HPI  Physical Examination  Vitals:   01/30/24 0952  BP: (!) 147/75  Pulse: 84  Resp: 20  Temp: 97.7 F (36.5 C)  SpO2: 93%   Body mass index is 40.17 kg/m.  General:  WDWN in NAD Gait: Not observed HENT: WNL, normocephalic Pulmonary: normal non-labored breathing, without Rales, rhonchi,  wheezing Cardiac: regular, without  Murmurs, rubs or gallops; without carotid bruits Abdomen: Positive bowel sounds throughout, soft, NT/ND, no masses Skin:  without rashes Vascular Exam/Pulses: Palpable pulses throughout except no thrill and bruit in Left  Upper Extremity  A/V Fistula noted.  Extremities: without ischemic changes, without Gangrene , without cellulitis; without open wounds;  Musculoskeletal: no muscle wasting or atrophy  Neurologic: A&O X 3;  No focal weakness or paresthesias are detected; speech is fluent/normal Psychiatric:  The pt has Normal affect. Lymph:  Unremarkable  CBC    Component Value Date/Time   WBC 6.8 09/23/2023 1529   RBC 3.11 (L) 09/23/2023 1529   HGB 9.7 (L) 09/23/2023 1529   HCT 30.6 (L) 09/23/2023 1529   PLT 121 (L) 09/23/2023 1529   MCV 98.4 09/23/2023 1529   MCH 31.2 09/23/2023 1529   MCHC 31.7 09/23/2023 1529   RDW 14.6 09/23/2023 1529   LYMPHSABS 1.6 07/16/2023 2138   MONOABS 1.1 (H) 07/16/2023 2138   EOSABS 0.2 07/16/2023 2138   BASOSABS 0.0 07/16/2023 2138    BMET    Component Value Date/Time   NA 134 (L) 09/23/2023 1529   K 4.6 09/23/2023 1529   CL 98 09/23/2023 1529   CO2 27 09/23/2023 1529   GLUCOSE 164 (H) 09/23/2023 1529   BUN 31 (H) 09/23/2023 1529   CREATININE 7.12 (H) 09/23/2023 1529   CALCIUM  8.1 (L) 09/23/2023 1529   GFRNONAA 6 (L) 09/23/2023 1529    COAGS: Lab Results  Component Value Date   INR 1.6 (H) 03/13/2023   INR 1.2 07/22/2022   INR 1.8 (H) 03/02/2022     Non-Invasive Vascular Imaging:   None Ordered   Statin:  Yes.   Beta Blocker:  No. Aspirin :  Yes.   ACEI:  No. ARB:  No. CCB use:  No Other antiplatelets/anticoagulants:  Yes.   Eliquis  5 mg BID    ASSESSMENT/PLAN: This is a 73 y.o. female has a long history of chronic kidney disease and on hemodialysis.  Patient presents to [facility] [place] emergency department after not being able to complete dialysis on Friday.  Last full dialysis session was on Wednesday of last week.  No noted thrill or bruit or pulse to patient's left upper extremity AV fistula today.  Therefore vascular surgery plans on taking the patient to the vascular lab for temporary dialysis catheter placement tomorrow 01/31/2024.  I discussed  in detail at the bedside with the patient today the procedure, benefits, risk, complications.  Patient verbalized understanding wishes to proceed to soon as possible.  Patient does not need to be n.p.o. after midnight tonight for procedure tomorrow.  Patient was given oral dose of Lokelma  with cardioprotective IV insulin  along with 1 amp of D50 in the emergency department.  Once the patient has been dialyzed and all electrolytes are stable patient will then be rescheduled to come back to the lab for a left upper extremity AV fistula thrombectomy to reestablish her dialysis access.   -I discussed the case in detail with Dr. Cordella Shawl MD and he agrees with the plan.   [name] [facility] 01/30/2024 10:17 AM

## 2024-01-30 NOTE — ED Triage Notes (Signed)
 Pt arrives via EMS from [facility]. Pt went for dialysis on Friday and was only able to have an hr of her session due to a vascular access problem. Facility was able to contact pt vascular team and they advised pt come to the ED. Pt last full dialysis was last Wednesday.

## 2024-01-30 NOTE — ED Provider Notes (Addendum)
 [facility] Provider Note    Event Date/Time   First MD Initiated Contact with Patient 01/30/24 [phone]     (approximate)   History   Vascular Access Problem   HPI  Jody Nelson is a 73 year old female with history of ESRD on HD presenting to the emergency department for evaluation of dialysis access problem.  On Friday, patient attended to her HD session, was only able to complete 1 hour as they were having issues accessing her fistula.  Today, the facility reportedly spoke with patient's vascular team and they recommended patient present to the ER.  Last full dialysis session was on Wednesday.  They did not attempt to access patient's site today.  Patient denies chest pain, shortness of breath.  Reviewed discharge summary from 09/24/2023.  At that time patient presented with inability to dialyze due to clotted fistula.  Patient initially sent to Cath Lab for thrombectomy, did have hyperkalemia, temporary HD catheter was placed.  On 5/16 underwent AV graft cannulation with thrombectomy and angioplasty, able to dialyze following this.       Physical Exam   Triage Vital Signs: ED Triage Vitals  Encounter Vitals Group     BP 01/30/24 0952 (!) 147/75     Girls Systolic BP Percentile --      Girls Diastolic BP Percentile --      Boys Systolic BP Percentile --      Boys Diastolic BP Percentile --      Pulse Rate 01/30/24 0952 84     Resp 01/30/24 0952 20     Temp 01/30/24 0952 97.7 F (36.5 C)     Temp Source 01/30/24 0952 Oral     SpO2 01/30/24 0952 93 %     Weight 01/30/24 0950 288 lb (130.6 kg)     Height 01/30/24 0950 5' 11 (1.803 m)     Head Circumference --      Peak Flow --      Pain Score 01/30/24 0950 0     Pain Loc --      Pain Education --      Exclude from Growth Chart --     Most recent vital signs: Vitals:   01/30/24 0952 01/30/24 1145  BP: (!) 147/75 (!) 142/78  Pulse: 84 74  Resp: 20 10  Temp: 97.7 F (36.5 C) 98 F (36.7 C)   SpO2: 93% 100%     General: Awake, interactive  CV:  Regular rate, good peripheral perfusion.  Resp:  Unlabored respirations.  Abd:  Nondistended.  Neuro:  Symmetric facial movement, fluid speech MSK:  Left upper extremity fistula without clear palpable thrill   ED Results / Procedures / Treatments   Labs (all labs ordered are listed, but only abnormal results are displayed) Labs Reviewed  CBC - Abnormal; Notable for the following components:      Result Value   RBC 3.86 (*)    Hemoglobin 11.5 (*)    RDW 16.6 (*)    Platelets 81 (*)    All other components within normal limits  COMPREHENSIVE METABOLIC PANEL WITH GFR - Abnormal; Notable for the following components:   Potassium 5.9 (*)    BUN 51 (*)    Creatinine, Ser 11.59 (*)    Albumin  3.2 (*)    AST 13 (*)    GFR, Estimated 3 (*)    All other components within normal limits     EKG EKG independently reviewed and interpreted by myself demonstrates:  RADIOLOGY Imaging independently reviewed and interpreted by myself demonstrates:   Formal Radiology Read:  No results found.  PROCEDURES:  Critical Care performed: Yes, see critical care procedure note(s)  CRITICAL CARE Performed by: Nilsa Dade   Total critical care time: 32 minutes  Critical care time was exclusive of separately billable procedures and treating other patients.  Critical care was necessary to treat or prevent imminent or life-threatening deterioration.  Critical care was time spent personally by me on the following activities: development of treatment plan with patient and/or surrogate as well as nursing, discussions with consultants, evaluation of patient's response to treatment, examination of patient, obtaining history from patient or surrogate, ordering and performing treatments and interventions, ordering and review of laboratory studies, ordering and review of radiographic studies, pulse oximetry and re-evaluation of patient's  condition.   Procedures   MEDICATIONS ORDERED IN ED: Medications  insulin  aspart (novoLOG ) injection 5 Units (has no administration in time range)    And  dextrose  50 % solution 50 mL (has no administration in time range)  sodium zirconium cyclosilicate  (LOKELMA ) packet 10 g (has no administration in time range)  calcium  gluconate 1 g/ 50 mL sodium chloride  IVPB (has no administration in time range)     IMPRESSION / MDM / ASSESSMENT AND PLAN / ED COURSE  I reviewed the triage vital signs and the nursing notes.  Differential diagnosis includes, but is not limited to, clotted dialysis fistula, other access complication, hyperkalemia  Patient's presentation is most consistent with acute presentation with potential threat to life or bodily function.  73 year old female presenting with dialysis complication issue, history of similar.  Stable vitals on presentation.  Concerns for clotted fistula based on history.  Case discussed with Dr. Jama, he was not personally aware of this patient.  She did unfortunately eat at 8:30 AM today.  Will obtain labs, discuss further with vascular surgery for planning.  Significant delay in obtaining labs due to difficult access.  Blood work unable to obtain by [employer], phlebotomy.  IV team was able to obtain access.  Potassium resulted at 5.9.  This was discussed with Dr. Jama.  The declogging procedure unfortunately causes red blood cell breakdown which increases potassium so patient will need dialysis prior to being able to proceed with this.  He will evaluate patient for temporary dialysis placement.  In the setting of this, he does recommend admission to hospitalist service.  Will reach out to hospitalist team. Clinical Course as of 01/30/24 1526  Mon Jan 30, 2024  1525 Case discussed with hospitalist team.  They will evaluate for anticipated admission. [NR]    Clinical Course User Index [NR] Dade Nilsa, MD     FINAL CLINICAL IMPRESSION(S) / ED  DIAGNOSES   Final diagnoses:  Thrombosis of kidney dialysis arteriovenous graft, initial encounter (HCC)  Hyperkalemia     Rx / DC Orders   ED Discharge Orders     None        Note:  This document was prepared using Dragon voice recognition software and may include unintentional dictation errors.   Dade Nilsa, MD 01/30/24 1511    Dade Nilsa, MD 01/30/24 [phone]

## 2024-01-30 NOTE — H&P (Signed)
 History and Physical    Patient: Evia Goldsmith FMW:[id number] DOB: 04-20-1951 DOA: 01/30/2024 DOS: the patient was seen and examined on 01/30/2024 PCP: [facility]  Patient coming from: [facility] Mobility: Primarily wheelchair.  2+ assist.  An active PT at this time.  Patient states she is in the process of transferring to a nursing facility in [facility] .  Chief Complaint:  Chief Complaint  Patient presents with   Vascular Access Problem   HPI: Brynnlee Cumpian is a 73 y.o. 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  Patient was sent to the ED from her nursing facility.  Apparently unable to complete dialysis this past Friday due to malfunctioning AVF in left upper extremity.  Was informed by vascular to send the patient to the ED for evaluation.  Full vascular consultation pending at time of evaluation but apparently given patient's current potassium of 5.9 recommendation was to place tunneled catheter for patient to undergo dialysis today with eventual declotting procedure of AVF within the next 24 hours.  Clinically patient is stable.  She has received a dose of Lokelma .  She has no respiratory symptoms.  She has trace bilateral lower extremity edema.  Full service has been asked to evaluate the patient for admission.  Review of Systems: As mentioned in the history of present illness. All other systems reviewed and are negative.  Past Medical History:  Diagnosis Date   (HFpEF) heart failure with preserved ejection fraction (HCC)    a.) TTE 10/12/2012: EF >55%, triv PR, G1DD; b.) TTE 10/18/2012: EF >55%, LVH, LAE, triv MT/TR/PR; c.) TTE 06/27/2014: EF >55%, mild MAC, G1DD; d.) TTE 03/18/2017: EF  60-65%, LAE, AoV sclerosis, G1DD; e.) TTE 07/13/2016: EF >55%, LVH, triv MR/TR; f.) TTE 01/17/2019: EF >55%, LVH, GLS -18.1%, triv PR   Anemia of chronic renal failure    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   At high risk for falls    Atypical chest pain    Bilateral lower extremity edema    Chronic right sided weakness    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   a.) MRI brain 02/27/2011: old lacunar infarct in RIGHT pons and medulla oblongata   Cryptogenic stroke (HCC) 10/11/2012   a.) CT brain 10/11/2012: tiny old lacunar infarct in head of caudate nucleus (left)   Cryptogenic stroke (HCC) 10/12/2012   a.) MRI brain 10/12/2012: multiple acute LEFT hemispheric infarcts   Cryptogenic stroke (HCC) 06/26/2014   a.) MRI brain 06/26/2014: acute RIGHT posterior frontal lobe infarct involving both and grey matter   Cryptogenic stroke (HCC) 07/29/2015   a.) MRI brain 07/29/2015: 2 foci of diffusion restriction in the LEFT parietal lobe consistent with acute infarction   Cryptogenic stroke (HCC) 07/24/2017   a.) MRI brain 07/24/2017: punctate focus of acute ischemia in the medial LEFT temporal lobe   DDD (degenerative disc disease), cervical    Depression    DOE (dyspnea on exertion)    Dysarthria as late effect of stroke    ESRD (end stage renal disease) on dialysis (HCC)    a.) M-W-F   GERD (gastroesophageal reflux disease)    Hallux valgus, bilateral    Heterozygous factor V Leiden mutation 07/18/2018   History of cardiac catheterization 08/30/2008   a.) LHC 08/30/2008: EF >60%, LVEDP 18  mmHg, normal coronaries.   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   a.) 07/18/2018 --> 22 mol/L   Hypertension    Insomnia    a.)  melatonin + trazodone  PRN   Long term current use of anticoagulant    a.) apixaban    Lymphedema of both lower extremities    Meralgia paresthetica, left lower limb    OSA (obstructive sleep apnea)    a.) does not require nocturnal PAP therapy   Osteoarthritis    Pneumonia    Pseudophakia of both eyes    Sarcoidosis    Secondary hyperparathyroidism of renal origin    Type 2 diabetes mellitus treated with insulin  (HCC)    Vitamin D deficiency    Past Surgical History:  Procedure Laterality Date   A/V FISTULAGRAM Left 12/28/2022   Procedure: A/V Fistulagram;  Surgeon: Jama Cordella MATSU, MD;  Location: [facility];  Service: Cardiovascular;  Laterality: Left;   APPLICATION OF WOUND VAC Right 06/15/2022   Procedure: APPLICATION OF WOUND VAC;  Surgeon: Tobie Priest, MD;  Location: [facility];  Service: Orthopedics;  Laterality: Right;   AV FISTULA PLACEMENT Left 04/16/2022   Procedure: INSERTION OF ARTERIOVENOUS (AV) GORE-TEX GRAFT ARM (BRACHIAL AXILLARY);  Surgeon: Jama Cordella MATSU, MD;  Location: [facility];  Service: Vascular;  Laterality: Left;   CESAREAN SECTION     DIALYSIS/PERMA CATHETER INSERTION N/A 10/14/2021   Procedure: DIALYSIS/PERMA CATHETER INSERTION;  Surgeon: Marea Selinda RAMAN, MD;  Location: [facility];  Service: Cardiovascular;  Laterality: N/A;   DIALYSIS/PERMA CATHETER INSERTION N/A 03/14/2023   Procedure: DIALYSIS/PERMA CATHETER INSERTION;  Surgeon: Marea Selinda RAMAN, MD;  Location: [facility];  Service: Cardiovascular;  Laterality: N/A;   DIALYSIS/PERMA CATHETER REMOVAL N/A 08/02/2022   Procedure: DIALYSIS/PERMA CATHETER REMOVAL;  Surgeon: Marea Selinda RAMAN, MD;  Location: [facility];  Service: Cardiovascular;  Laterality: N/A;   DIALYSIS/PERMA CATHETER REMOVAL N/A 06/27/2023   Procedure: DIALYSIS/PERMA CATHETER REMOVAL;  Surgeon: Marea Selinda RAMAN, MD;  Location: [facility];  Service: Cardiovascular;  Laterality: N/A;   FRACTURE SURGERY     leg  right fracture several surgeries, rods   INCISION AND DRAINAGE OF WOUND Right 06/15/2022   Procedure: IRRIGATION AND DEBRIDEMENT WOUND;  Surgeon: Tobie Priest, MD;  Location: [facility];  Service: Orthopedics;  Laterality: Right;   PERIPHERAL VASCULAR THROMBECTOMY N/A 09/23/2023   Procedure: PERIPHERAL VASCULAR THROMBECTOMY;  Surgeon: Marea Selinda RAMAN, MD;  Location: [facility];  Service: Cardiovascular;  Laterality: N/A;   TEMPORARY DIALYSIS CATHETER N/A 09/19/2023   Procedure: TEMPORARY DIALYSIS CATHETER;  Surgeon: Marea Selinda RAMAN, MD;  Location: [facility];  Service: Cardiovascular;  Laterality: N/A;   Social History:  reports that she quit smoking about 5 years ago. Her smoking use included cigarettes. She has never used smokeless tobacco. She reports that she does not drink alcohol and does not use drugs.  Allergies  Allergen Reactions   Sulfamethoxazole-Trimethoprim     Other reaction(s): Kidney Disorder Other reaction(s): Kidney Disorder Hyperkalemia, AKI Hyperkalemia, AKI     Family History  Problem Relation Age of Onset   Diabetes Mother    Colon cancer Sister    Diabetes Sister    Cancer Maternal Uncle    Clotting disorder  Paternal Grandmother    Colon cancer Paternal Grandfather     Prior to Admission medications   Medication Sig Start Date End Date Taking? Authorizing Provider  acetaminophen  (TYLENOL ) 325 MG tablet Take 2 tablets by mouth every 8 (eight) hours as needed.    [provider]  albuterol  (PROVENTIL ) (2.5 MG/3ML) 0.083% nebulizer solution Take 2.5 mg by nebulization every 6 (six) hours as needed for wheezing or shortness of breath. 10/21/22   [provider]  albuterol  (VENTOLIN  HFA) 108 (90 Base) MCG/ACT inhaler Inhale 1-2 puffs into the lungs every 4 (four) hours as needed for wheezing or shortness of breath.    [provider]  apixaban  (ELIQUIS ) 5 MG TABS tablet Take 1 tablet (5 mg total) by mouth 2 (two) times daily.  07/27/22   Maree Hue, MD  aspirin  81 MG chewable tablet Chew 81 mg by mouth daily.    [provider]  atorvastatin  (LIPITOR ) 80 MG tablet Take 80 mg by mouth daily. 02/19/21   [provider]  calcitRIOL  (ROCALTROL ) 0.25 MCG capsule Take 0.25 mcg by mouth daily. 10/22/21   [provider]  calcium  acetate (PHOSLO ) 667 MG capsule Take one capsule by mouth twice daily with food on Mondays, Wednesdays, and Fridays. Take one capsule 3 times daily on Tuesdays, Thursdays, Saturdays, and Sundays. 06/10/22   [provider]  Calcium  Carb-Cholecalciferol (OYSTER SHELL CALCIUM  W/D) 500-5 MG-MCG TABS Take 1 tablet by mouth every Tuesday, Thursday, Saturday, and Sunday. 02/19/21   [provider]  diclofenac  Sodium (VOLTAREN ) 1 % GEL Apply 2 g topically every 4 (four) hours as needed (pain). Apply bilateral hips    [provider]  ferrous sulfate  324 (65 Fe) MG TBEC Take 1 tablet by mouth daily. 02/19/21   [provider]  fluticasone  (FLONASE ) 50 MCG/ACT nasal spray Place 1 spray into both nostrils daily. 10/18/22   [provider]  folic acid  (FOLVITE ) 1 MG tablet Take 1 mg by mouth daily. 02/19/21   [provider]  HYDROcodone -acetaminophen  (NORCO/VICODIN) 5-325 MG tablet Take 1 tablet by mouth every 6 (six) hours as needed for moderate pain (pain score 4-6) or severe pain (pain score 7-10). 09/24/23   Alexander, Natalie, DO  insulin  glargine (LANTUS ) 100 UNIT/ML Solostar Pen Inject 10 Units into the skin at bedtime. 02/17/21   [provider]  insulin  lispro (HUMALOG) 100 UNIT/ML injection Inject 2-12 Units into the skin in the morning, at noon, and at bedtime. Per sliding scale    [provider]  latanoprost  (XALATAN ) 0.005 % ophthalmic solution Place 1 drop into both eyes at bedtime. 02/08/22   [provider]  lidocaine -prilocaine  (EMLA ) cream Apply 1 application  topically 3 (three) times a week. On  mon, wed, and friday    [provider]  loratadine  (CLARITIN ) 10 MG tablet Take 10 mg by mouth daily.    [provider]  magnesium  hydroxide (MILK OF MAGNESIA) 400 MG/5ML suspension Take 30 mLs by mouth daily as needed for mild constipation.    [provider]  montelukast  (SINGULAIR ) 10 MG tablet Take 10 mg by mouth daily. 02/19/21   [provider]  Multiple Vitamins-Minerals (HEALTHY EYES SUPERVISION 2) CAPS Take 1 capsule by mouth 2 (two) times daily.    [provider]  Nystatin (GERHARDT'S BUTT CREAM) CREA Apply 1 Application topically 4 (four) times daily as needed for irritation. 09/24/23   Alexander, Natalie, DO  omeprazole (PRILOSEC) 10 MG capsule Take 10 mg by  mouth daily. 02/25/22   [provider]  OZEMPIC, 2 MG/DOSE, 8 MG/3ML SOPN Inject 8 mg into the skin once a week. Friday 08/26/23   [provider]  phenylephrine  (,USE FOR PREPARATION-H,) 0.25 % suppository Place 1 suppository rectally 2 (two) times daily as needed for hemorrhoids. 09/24/23   Alexander, Natalie, DO  polyethylene glycol (MIRALAX  / GLYCOLAX ) 17 g packet Take 17 g by mouth daily.    [provider]  pregabalin  (LYRICA ) 25 MG capsule Take 1 capsule (25 mg total) by mouth daily. 09/24/23   Alexander, Natalie, DO  senna-docusate (SENOKOT-S) 8.6-50 MG tablet Take 2 tablets by mouth at bedtime as needed for mild constipation. 09/24/23   Alexander, Natalie, DO  torsemide  (DEMADEX ) 10 MG tablet Take 1 tablet (10 mg total) by mouth daily as needed. 1 tablet (10 mg total) by mouth daily as needed for up to 3 days for increased leg swelling, shortness of breath, weight gain 5+ lbs over 1-2 days. Seek medical care if these symptoms are not improving 09/24/23   Marsa Edelman, DO    Physical Exam: Vitals:   01/30/24 0950 01/30/24 0952 01/30/24 1145  BP:  (!) 147/75 (!) 142/78  Pulse:  84 74  Resp:  20 10  Temp:  97.7 F (36.5 C) 98 F (36.7 C)   TempSrc:  Oral   SpO2:  93% 100%  Weight: 130.6 kg    Height: 5' 11 (1.803 m)     Constitutional: NAD, calm, comfortable Respiratory: clear to auscultation bilaterally, no wheezing, no crackles. Normal respiratory effort. No accessory muscle use room air.  Cardiovascular: Regular rate and rhythm, no murmurs / rubs / gallops.  1+ bilateral lower extremity edema. 2+ pedal pulses.  Abdomen: no tenderness, no masses palpated.  Bowel sounds positive.  Musculoskeletal: no clubbing / cyanosis. No joint deformity upper and lower extremities. Good ROM, no contractures. Normal muscle tone.  Skin: no rashes, lesions, ulcers. No induration Neurologic: CN 2-12 grossly intact. Sensation intact, DTR normal. Strength 4/5 x all 4 extremities.  Psychiatric: Normal judgment and insight. Alert and oriented x 3. Normal mood.     Data Reviewed:  Sodium 136, potassium 5.9, glucose 97, BUN 51, creatinine 11.59, albumin  3.2  WBC 5.4, differential not obtained, hemoglobin 11.5, platelets 81,000  Assessment and Plan: Malfunctioning dialysis access CKD 5 MWF Acute hyperkalemia Plan is to initially place tunneled catheter though can proceed with dialysis tonight In addition to Lokelma  patient was also given cardioprotective IV insulin  along with 1 amp of D50 by the EDP Once hyperkalemia corrected plan is to pursue declotting procedure of left upper extremity AVF Last full dialysis session Wednesday 1 week ago From a respiratory standpoint she is stable without shortness of breath - she does have 1+ bilateral lower extremity edema but has known bilateral lower extremity lymphedema as well BMD medications as per outpatient doses Repeat electrolyte panel at 6 PM  Hypercoagulopathy Known anticardiolipin antibody positive as well as factor V Leiden positive On Eliquis  5 mg twice daily prior to admission Hold Eliquis  for pretunneled cath placement with last dose known PM 9/21  Diabetes mellitus 2 Follow CBGs  and provide SSI renal dosing Insulin  glargine 10 units at bedtime  Dyslipidemia Continue statin  Peripheral neuropathy Continue low-dose Lyrica  as well as low-dose Vicodin     Advance Care Planning:   Code Status: Full Code   VTE prophylaxis: Preadmission Eliquis  on hold.  SCDs  Consults: Nephrology, vascular services  Family Communication: Patient only  Severity of Illness: The appropriate patient status for this patient is OBSERVATION. Observation status is judged to be reasonable and necessary in order to provide the required intensity of service to ensure the patient's safety. The patient's presenting symptoms, physical exam findings, and initial radiographic and laboratory data in the context of their medical condition is felt to place them at decreased risk for further clinical deterioration. Furthermore, it is anticipated that the patient will be medically stable for discharge from the hospital within 2 midnights of admission.   Author: Isaiah Lever, NP 01/30/2024 3:55 PM  For on call review [email or web address].

## 2024-01-30 NOTE — ED Notes (Signed)
 Pt difficult stick. RN notified provider

## 2024-01-31 ENCOUNTER — Encounter
Admission: EM | Disposition: A | Discharge status: Skilled Nursing Facility | Payer: Self-pay | Source: Skilled Nursing Facility | Attending: Internal Medicine

## 2024-01-31 DIAGNOSIS — E1122 Type 2 diabetes mellitus with diabetic chronic kidney disease: Secondary | ICD-10-CM | POA: Diagnosis not present

## 2024-01-31 DIAGNOSIS — N186 End stage renal disease: Secondary | ICD-10-CM | POA: Diagnosis not present

## 2024-01-31 DIAGNOSIS — E875 Hyperkalemia: Secondary | ICD-10-CM | POA: Diagnosis not present

## 2024-01-31 DIAGNOSIS — T82898A Other specified complication of vascular prosthetic devices, implants and grafts, initial encounter: Secondary | ICD-10-CM | POA: Diagnosis not present

## 2024-01-31 LAB — CBC
HCT: 31.9 % — ABNORMAL LOW (ref 36.0–46.0)
Hemoglobin: 10.1 g/dL — ABNORMAL LOW (ref 12.0–15.0)
MCH: 29.8 pg (ref 26.0–34.0)
MCHC: 31.7 g/dL (ref 30.0–36.0)
MCV: 94.1 fL (ref 80.0–100.0)
Platelets: 99 K/uL — ABNORMAL LOW (ref 150–400)
RBC: 3.39 MIL/uL — ABNORMAL LOW (ref 3.87–5.11)
RDW: 16.4 % — ABNORMAL HIGH (ref 11.5–15.5)
WBC: 5.7 K/uL (ref 4.0–10.5)
nRBC: 0 % (ref 0.0–0.2)

## 2024-01-31 LAB — BASIC METABOLIC PANEL WITH GFR
Anion gap: 12 (ref 5–15)
BUN: 56 mg/dL — ABNORMAL HIGH (ref 8–23)
CO2: 23 mmol/L (ref 22–32)
Calcium: 9.7 mg/dL (ref 8.9–10.3)
Chloride: 100 mmol/L (ref 98–111)
Creatinine, Ser: 12.02 mg/dL — ABNORMAL HIGH (ref 0.44–1.00)
GFR, Estimated: 3 mL/min — ABNORMAL LOW (ref >60)
Glucose, Bld: 96 mg/dL (ref 70–99)
Potassium: 5.8 mmol/L — ABNORMAL HIGH (ref 3.5–5.1)
Sodium: 135 mmol/L (ref 135–145)

## 2024-01-31 LAB — GLUCOSE, CAPILLARY
Glucose-Capillary: 78 mg/dL (ref 70–99)
Glucose-Capillary: 112 mg/dL — ABNORMAL HIGH (ref 70–99)
Glucose-Capillary: 84 mg/dL (ref 70–99)
Glucose-Capillary: 128 mg/dL — ABNORMAL HIGH (ref 70–99)

## 2024-01-31 LAB — POTASSIUM
Potassium: 6.5 mmol/L (ref 3.5–5.1)
Potassium: 6.1 mmol/L — ABNORMAL HIGH (ref 3.5–5.1)

## 2024-01-31 SURGERY — TEMPORARY DIALYSIS CATHETER
Anesthesia: Moderate Sedation

## 2024-01-31 MED ORDER — CALCIUM GLUCONATE-NACL 1-0.675 GM/50ML-% IV SOLN
1.0000 g | Freq: Once | INTRAVENOUS | Status: AC
  Administered 2024-02-01: 1000 mg via INTRAVENOUS
  Filled 2024-01-31: qty 50

## 2024-01-31 MED ORDER — CHLORHEXIDINE GLUCONATE CLOTH 2 % EX PADS
6.0000 | MEDICATED_PAD | Freq: Every day | CUTANEOUS | Status: DC
  Administered 2024-01-31 – 2024-02-02 (×3): 6 via TOPICAL

## 2024-01-31 MED ORDER — DEXTROSE 50 % IV SOLN
1.0000 | Freq: Once | INTRAVENOUS | Status: AC
  Administered 2024-02-01: 50 mL via INTRAVENOUS
  Filled 2024-01-31: qty 50

## 2024-01-31 MED ORDER — INSULIN ASPART 100 UNIT/ML IV SOLN
5.0000 [IU] | Freq: Once | INTRAVENOUS | Status: AC
  Administered 2024-02-01: 5 [IU] via INTRAVENOUS
  Filled 2024-01-31: qty 0.05

## 2024-01-31 MED ORDER — SODIUM ZIRCONIUM CYCLOSILICATE 10 G PO PACK
10.0000 g | PACK | Freq: Once | ORAL | Status: AC
Start: 2024-02-01 — End: 2024-02-01
  Administered 2024-02-01: 10 g via ORAL
  Filled 2024-01-31: qty 1

## 2024-01-31 NOTE — Progress Notes (Signed)
  Received patient in bed to unit.   Informed consent signed and in chart.    TX duration:2:45     Transported by  Hand-off given to patient's nurse.    Access used: L AVG Access issues: cannulated x2 with good results , however as tx continued poor arterial needle flow rate.  NP  made aware and pt will go for fistulagram on 02/02/24    Pt  needing to go to bathroom, I offered pt bedpan several times and pt stated that she cannot use a bedpan, she has tried and it never works.  Pt asked to be taken off her tx and sent back to room to use bedside commode.  Pt signed off 45 min early  Total UF removed: 1.4L Medication(s) given: nobe Post HD VS: wnl      Olivia Hurst LPN [facility]

## 2024-01-31 NOTE — Progress Notes (Signed)
 Progress Note    01/31/2024 8:03 AM * No surgery found *  Subjective:  Jody Nelson is a 73 yo female who presents to [facility] emergency department with a suspected left upper extremity fistula that does not work.  I long conversation with the hemodialysis nurse this morning who had seen her on an outpatient basis yesterday.  Apparently he was told that her previous dialysis session ran for 2 hours and it had some alarms but that her fistula did not work was not completely clotted off.  The outpatient dialysis clinic she goes to be short staffed so they were diverting patients to another clinic and the patient did not want to go so she came to the hospital.  Patient is resting comfortably in bed this morning no complaints.  Vitals are remained stable.   Vitals:   01/31/24 0522 01/31/24 0759  BP: (!) 129/59 (!) 119/53  Pulse: 85 82  Resp: 16 16  Temp: 98.5 F (36.9 C) 97.6 F (36.4 C)  SpO2: 99% 100%   Physical Exam: Cardiac:  RRR, S1 and S2.  No murmurs Lungs: Nonlabored breathing lungs clear throughout on auscultation no rales rhonchi or wheezing Incisions: None Extremities: All extremities warm to touch with palpable pulses.  Left upper extremity with AV fistula. Abdomen: Positive bowel sounds throughout, morbidly obese, soft, nontender nondistended. Neurologic: Alert and oriented x 3, answers questions follows commands.  CBC    Component Value Date/Time   WBC 5.7 01/31/2024 0443   RBC 3.39 (L) 01/31/2024 0443   HGB 10.1 (L) 01/31/2024 0443   HCT 31.9 (L) 01/31/2024 0443   PLT 99 (L) 01/31/2024 0443   MCV 94.1 01/31/2024 0443   MCH 29.8 01/31/2024 0443   MCHC 31.7 01/31/2024 0443   RDW 16.4 (H) 01/31/2024 0443   LYMPHSABS 1.6 07/16/2023 2138   MONOABS 1.1 (H) 07/16/2023 2138   EOSABS 0.2 07/16/2023 2138   BASOSABS 0.0 07/16/2023 2138    BMET    Component Value Date/Time   NA 135 01/31/2024 0443   K 5.8 (H) 01/31/2024 0443   CL 100 01/31/2024 0443   CO2 23  01/31/2024 0443   GLUCOSE 96 01/31/2024 0443   BUN 56 (H) 01/31/2024 0443   CREATININE 12.02 (H) 01/31/2024 0443   CALCIUM  9.7 01/31/2024 0443   GFRNONAA 3 (L) 01/31/2024 0443    INR    Component Value Date/Time   INR 1.6 (H) 03/13/2023 0630     Intake/Output Summary (Last 24 hours) at 01/31/2024 0803 Last data filed at 01/30/2024 2356 Gross per 24 hour  Intake 290 ml  Output --  Net 290 ml     Assessment/Plan:  73 y.o. female who presented to [facility] emergency department yesterday with suspected nonfunctioning left upper extremity AV graft.* No surgery found *   PLAN After a long conversation with the hemodialysis nurse who took care of her outpatient yesterday's been determined that her AV fistula graft is functioning but not properly.  Therefore vascular surgery asks nephrology to take her to dialysis today access the graft and see if hemodialysis can be done.  This will save us  from taking her to the vascular lab today putting a temporary catheter in.  If she can be dialyzed without any difficulties we can schedule outpatient fistulogram.  If there is difficulties with the hemodialysis we can take her to the lab later this week after they run dialysis on her today.  Patient's potassiums continue to run high at 5.8 this morning even  after being treated for hyperkalemia.  Nephrology is made aware.   DVT prophylaxis: Heparin  with dialysis.   [name] [facility] 01/31/2024 8:03 AM

## 2024-01-31 NOTE — Progress Notes (Addendum)
 [facility]  ROUNDING NOTE   Subjective:   Jody Nelson 73 y.o. female with  heart failure with preserved ejection fraction, end-stage renal disease, degenerative joint disease, depression, hypertension, sleep apnea, coronary disease, obesity, GERD, diabetes type 2, history of stroke. She presents to ED with dialysis access concerns and has been admitted under observation for Hyperkalemia [E87.5] Acute hyperkalemia [E87.5] Thrombosis of kidney dialysis arteriovenous graft, initial encounter [T82.868A].  Patient is known to our practice and receives outpatient dialysis treatments at [facility] on a MWF schedule, supervised by Dr. Dennise.  Last full dialysis treatment received on Wednesday.  Patient did receive about 1 hour of treatment on Friday before multiple machines alarms due to access.  Patient has no complaints at this time.  Currently sitting up in bed preparing to eat breakfast.  Patient remains on room air, denies shortness of breath.  Left upper arm access positive for bruit and thrill.  Labs on ED arrival concerning for potassium 5.9, BUN 51, creatinine 11.59 with GFR 3, and hemoglobin 11.5.  Vascular has been consulted to evaluate dialysis access.  We have been consulted to manage dialysis needs during this admission.   Objective:  Vital signs in last 24 hours:  Temp:  [97.5 F (36.4 C)-98.5 F (36.9 C)] 97.6 F (36.4 C) (09/23 0759) Pulse Rate:  [80-85] 82 (09/23 0759) Resp:  [14-16] 16 (09/23 0759) BP: (119-147)/(53-73) 119/53 (09/23 0759) SpO2:  [99 %-100 %] 100 % (09/23 0759)  Weight change:  Filed Weights   01/30/24 0950  Weight: 130.6 kg    Intake/Output: I/O last 3 completed shifts: In: 290 [P.O.:240; IV Piggyback:50] Out: -    Intake/Output this shift:  No intake/output data recorded.  Physical Exam: General: NAD  Head: Normocephalic, atraumatic. Moist oral mucosal membranes  Eyes: Anicteric  Lungs:  Clear to auscultation   Heart: Regular rate and rhythm  Abdomen:  Soft, nontender  Extremities: 1+ peripheral edema.  Neurologic: Awake, alert, conversant  Skin: Warm,dry, no rash  Access: Left upper aVF    Basic Metabolic Panel: Recent Labs  Lab 01/30/24 1402 01/30/24 2010 01/31/24 0443  NA 136 136 135  K 5.9* 5.3* 5.8*  CL 100 102 100  CO2 24 23 23   GLUCOSE 97 145* 96  BUN 51* 57* 56*  CREATININE 11.59* 11.91* 12.02*  CALCIUM  9.9 9.8 9.7    Liver Function Tests: Recent Labs  Lab 01/30/24 1402  AST 13*  ALT 6  ALKPHOS 52  BILITOT 1.2  PROT 7.1  ALBUMIN  3.2*   No results for input(s): LIPASE, AMYLASE in the last 168 hours. No results for input(s): AMMONIA in the last 168 hours.  CBC: Recent Labs  Lab 01/30/24 1402 01/31/24 0443  WBC 5.4 5.7  HGB 11.5* 10.1*  HCT 36.8 31.9*  MCV 95.3 94.1  PLT 81* 99*    Cardiac Enzymes: No results for input(s): CKTOTAL, CKMB, CKMBINDEX, TROPONINI in the last 168 hours.  BNP: Invalid input(s): POCBNP  CBG: Recent Labs  Lab 01/30/24 1715 01/30/24 1837 01/30/24 2145 01/31/24 0803 01/31/24 1139  GLUCAP 68* 91 131* 78 112*    Microbiology: Results for orders placed or performed during the hospital encounter of 01/30/24  MRSA Next Gen by PCR, Nasal     Status: None   Collection Time: 01/30/24  6:25 PM   Specimen: Nasal Mucosa; Nasal Swab  Result Value Ref Range Status   MRSA by PCR Next Gen NOT DETECTED NOT DETECTED Final    Comment: (  NOTE) The GeneXpert MRSA Assay (FDA approved for NASAL specimens only), is one component of a comprehensive MRSA colonization surveillance program. It is not intended to diagnose MRSA infection nor to guide or monitor treatment for MRSA infections. Test performance is not FDA approved in patients less than 67 years old. Performed at [facility], [address] [place], [place] [address]     Coagulation Studies: No results for input(s): LABPROT, INR in the last 72  hours.  Urinalysis: No results for input(s): COLORURINE, LABSPEC, PHURINE, GLUCOSEU, HGBUR, BILIRUBINUR, KETONESUR, PROTEINUR, UROBILINOGEN, NITRITE, LEUKOCYTESUR in the last 72 hours.  Invalid input(s): APPERANCEUR    Imaging: No results found.   Medications:     atorvastatin   80 mg Oral QPM   calcitRIOL   0.25 mcg Oral Daily   calcium  acetate  667 mg Oral TID WC   Chlorhexidine  Gluconate Cloth  6 each Topical Q0600   ferrous sulfate   325 mg Oral Daily   folic acid   1 mg Oral Daily   insulin  aspart  0-5 Units Subcutaneous QHS   insulin  aspart  0-6 Units Subcutaneous TID WC   latanoprost   1 drop Both Eyes QHS   loratadine   10 mg Oral Daily   montelukast   10 mg Oral QPM   pantoprazole   40 mg Oral Daily   polyethylene glycol  17 g Oral Daily   pregabalin   25 mg Oral Daily   sodium chloride  flush  3 mL Intravenous Q12H   acetaminophen  **OR** acetaminophen , albuterol , HYDROcodone -acetaminophen   Assessment/ Plan:  [name] is a 73 y.o.  female with  heart failure with preserved ejection fraction, end-stage renal disease, degenerative joint disease, depression, hypertension, sleep apnea, coronary disease, obesity, GERD, diabetes type 2, history of stroke. She presents to ED with dialysis access concerns and has been admitted under observation for Hyperkalemia [E87.5] Acute hyperkalemia [E87.5] Thrombosis of kidney dialysis arteriovenous graft, initial encounter [T82.868A]  [facility]/MWF/left AVF  Nonfunctioning dialysis access, concern at outpatient clinic that access is clotted off.  Vascular surgery has been consulted.  Access positive for bruit and thrill.  Will attempt to use for treatment today and update vascular if HD temp cath is needed. Update: Access initially functioning well however began to have multiple arterial alarms with decreased blood flow of 300.  Vascular updated and will perform fistulogram on Thursday.  2.   End-stage renal disease on hemodialysis.  Last treatment received on Wednesday.  Will attempt dialysis today.  3. Anemia of chronic kidney disease Lab Results  Component Value Date   HGB 10.1 (L) 01/31/2024    Hemoglobin 10.1, acceptable for renal patient.  Patient does receive Mircera at outpatient clinic.  4.  Hypertension with chronic kidney disease.  Home regimen includes carvedilol .  Currently held at this time.  5. Secondary Hyperparathyroidism: with outpatient labs not available  Lab Results  Component Value Date   CALCIUM  9.7 01/31/2024   CAION 1.13 (L) 04/16/2022   PHOS 5.0 (H) 09/23/2023    Patient prescribed calcium  carbonate-cholecalciferol outpatient.  Will continue to monitor bone minerals during this admission.   LOS: 0 Bohdan Macho 9/23/20251:11 PM

## 2024-01-31 NOTE — Progress Notes (Signed)
 PROGRESS NOTE    Jody Nelson  FMW:[id number] DOB: 08/15/1950 DOA: 01/30/2024 PCP: [place]  Chief Complaint  Patient presents with   Vascular Access Problem    Hospital Course:  Jody Nelson is a 73 y.o. 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 Hospital course as below  Subjective: Was examined at bedside, new to me today.  Denies any new complaints today. Vascular and nephrology following   Objective: Vitals:   01/30/24 1652 01/30/24 2027 01/31/24 0522 01/31/24 0759  BP: (!) 147/73 126/66 (!) 129/59 (!) 119/53  Pulse:  80 85 82  Resp: 14 16 16 16   Temp: (!) 97.5 F (36.4 C) 98.1 F (36.7 C) 98.5 F (36.9 C) 97.6 F (36.4 C)  TempSrc: Oral   Oral  SpO2:  100% 99% 100%  Weight:      Height:        Intake/Output Summary (Last 24 hours) at 01/31/2024 0859 Last data filed at 01/30/2024 2356 Gross per 24 hour  Intake 290 ml  Output --  Net 290 ml   Filed Weights   01/30/24 0950  Weight: 130.6 kg    Examination: Constitutional: NAD, calm, comfortable Respiratory: clear to auscultation bilaterally, no wheezing, no crackles Cardiovascular: Regular rate and rhythm, no murmurs / rubs / gallops.  1+ bilateral lower extremity edema Abdomen: no tenderness, no masses palpated.  Bowel sounds positive.  Musculoskeletal: no clubbing / cyanosis. No joint deformity upper and lower extremities. Good ROM, no contractures. Normal muscle tone.  Skin: no rashes, lesions, ulcers. No induration Neurologic: CN 2-12 grossly intact. Sensation intact, DTR normal. Strength 4/5 x all 4 extremities.  Psychiatric: Normal judgment and insight. Alert and oriented x 3. Normal mood  Assessment & Plan:    Malfunctioning AF Fistula ESRD on HD - Seen by vascular surgery, appreciate recs - Vascular recommend nephrology to take her to dialysis today to access the graft - Initially functioning well, later had multiple arterial alarms with decreased blood flow. - Vascular to plan for fistulogram on Thursday  Mild hyperkalemia - s/p Lokelma , Insulin + D50 - s/p hemodialysis today   Anticardiolipin antibody Factor V Leiden - On Eliquis  at home - hold  Diabetes mellitus 2 - SSI  - Lantus  on hold  Dyslipidemia - Continue statin   Peripheral neuropathy - Continue low-dose Lyrica  as well as low-dose Vicodin  DVT prophylaxis: SCD   Code Status: Full Code Disposition:  TBD  Consultants:  Vascular surgery Nephrology  Procedures:  None  Antimicrobials:  Anti-infectives (From admission, onward)    None       Data Reviewed: I have personally reviewed following labs and imaging studies CBC: Recent Labs  Lab 01/30/24 1402 01/31/24 0443  WBC 5.4 5.7  HGB 11.5* 10.1*  HCT 36.8 31.9*  MCV 95.3 94.1  PLT 81* 99*   Basic Metabolic Panel: Recent Labs  Lab 01/30/24 1402 01/30/24 2010 01/31/24 0443  NA 136 136 135  K 5.9* 5.3* 5.8*  CL 100 102 100  CO2 24 23 23   GLUCOSE 97 145* 96  BUN 51* 57* 56*  CREATININE 11.59* 11.91* 12.02*  CALCIUM  9.9 9.8 9.7   GFR: Estimated Creatinine Clearance: 6.4 mL/min (A) (by C-G formula based on SCr of 12.02 mg/dL (H)).  Liver Function Tests: Recent Labs  Lab 01/30/24 1402  AST 13*  ALT 6  ALKPHOS 52  BILITOT 1.2  PROT 7.1  ALBUMIN  3.2*   CBG: Recent Labs  Lab 01/30/24 1715 01/30/24 1837 01/30/24 2145 01/31/24 0803  GLUCAP 68* 91 131* 78    Recent Results (from the past 240 hours)  MRSA Next Gen by PCR, Nasal     Status: None   Collection Time: 01/30/24  6:25 PM   Specimen: Nasal Mucosa; Nasal Swab  Result Value Ref Range Status   MRSA by PCR Next Gen NOT DETECTED NOT DETECTED Final    Comment: (NOTE) The  GeneXpert MRSA Assay (FDA approved for NASAL specimens only), is one component of a comprehensive MRSA colonization surveillance program. It is not intended to diagnose MRSA infection nor to guide or monitor treatment for MRSA infections. Test performance is not FDA approved in patients less than 23 years old. Performed at [facility], [address], [place], [place] [address]      Radiology Studies: No results found.  Scheduled Meds:  atorvastatin   80 mg Oral QPM   calcitRIOL   0.25 mcg Oral Daily   calcium  acetate  667 mg Oral TID WC   ferrous sulfate   325 mg Oral Daily   folic acid   1 mg Oral Daily   insulin  aspart  0-5 Units Subcutaneous QHS   insulin  aspart  0-6 Units Subcutaneous TID WC   latanoprost   1 drop Both Eyes QHS   loratadine   10 mg Oral Daily   montelukast   10 mg Oral QPM   pantoprazole   40 mg Oral Daily   patiromer   25.2 g Oral Daily   polyethylene glycol  17 g Oral Daily   pregabalin   25 mg Oral Daily   sodium chloride  flush  3 mL Intravenous Q12H   sodium zirconium cyclosilicate   10 g Oral Daily   Continuous Infusions:   LOS: 0 days  MDM: Patient is high risk for one or more organ failure.  They necessitate ongoing hospitalization for continued IV therapies and subsequent lab monitoring. Total time spent interpreting labs and vitals, reviewing the medical record, coordinating care amongst consultants and care team members, directly assessing and discussing care with the patient and/or family: 55 min Laree Lock, MD [facility]  To contact the attending physician between 7A-7P please use Epic Chat. To contact the covering physician during after hours 7P-7A, please review Amion.  01/31/2024, 8:59 AM   *This document has been created with the assistance of dictation software. Please excuse typographical errors. *

## 2024-01-31 NOTE — Progress Notes (Addendum)
   01/31/24 1415  Spiritual Encounters  Type of Visit Attempt (pt unavailable)  Care provided to: Pt not available  Conversation partners present during encounter Nurse  Referral source Patient request  Reason for visit Advance directives  OnCall Visit No   Chaplain visited Dialysis where patient was located in response to a Consult for an AD.  Patient was sleeping and so Chaplain shared with staff that she was there to provide and explain the AD.  Since patient was sleeping, Chaplain left the documentation and asked staff to let patient know she was there and for them to enter another Consult if patient needed any help understanding the document.    Rev. Rana M. Nicholaus, M.Div. Chaplain Resident  [facility]

## 2024-01-31 NOTE — Care Management Obs Status (Signed)
 MEDICARE OBSERVATION STATUS NOTIFICATION   Patient Details  Name: Jody Nelson MRN: [id number] Date of Birth: 12/11/1950   Medicare Observation Status Notification Given:  Chaney BRANDY CHRISTIANE LELON, CMA 01/31/2024, 11:30 AM

## 2024-01-31 NOTE — Progress Notes (Addendum)
 Pt receives outpt HD at [facility] on MWF. Navigator following to assist with any HD needs.  Suzen Satchel Dialysis Navigator [phone]

## 2024-02-01 DIAGNOSIS — N186 End stage renal disease: Secondary | ICD-10-CM | POA: Diagnosis present

## 2024-02-01 DIAGNOSIS — D6852 Prothrombin gene mutation: Secondary | ICD-10-CM | POA: Diagnosis present

## 2024-02-01 DIAGNOSIS — T82868A Thrombosis of vascular prosthetic devices, implants and grafts, initial encounter: Secondary | ICD-10-CM | POA: Diagnosis present

## 2024-02-01 DIAGNOSIS — I5032 Chronic diastolic (congestive) heart failure: Secondary | ICD-10-CM | POA: Diagnosis present

## 2024-02-01 DIAGNOSIS — Y832 Surgical operation with anastomosis, bypass or graft as the cause of abnormal reaction of the patient, or of later complication, without mention of misadventure at the time of the procedure: Secondary | ICD-10-CM | POA: Diagnosis present

## 2024-02-01 DIAGNOSIS — G4733 Obstructive sleep apnea (adult) (pediatric): Secondary | ICD-10-CM | POA: Diagnosis present

## 2024-02-01 DIAGNOSIS — M5030 Other cervical disc degeneration, unspecified cervical region: Secondary | ICD-10-CM | POA: Diagnosis present

## 2024-02-01 DIAGNOSIS — K219 Gastro-esophageal reflux disease without esophagitis: Secondary | ICD-10-CM | POA: Diagnosis present

## 2024-02-01 DIAGNOSIS — E1122 Type 2 diabetes mellitus with diabetic chronic kidney disease: Secondary | ICD-10-CM | POA: Diagnosis present

## 2024-02-01 DIAGNOSIS — E875 Hyperkalemia: Secondary | ICD-10-CM | POA: Diagnosis present

## 2024-02-01 DIAGNOSIS — T82858A Stenosis of vascular prosthetic devices, implants and grafts, initial encounter: Secondary | ICD-10-CM | POA: Diagnosis not present

## 2024-02-01 DIAGNOSIS — Z6841 Body Mass Index (BMI) 40.0 and over, adult: Secondary | ICD-10-CM | POA: Diagnosis not present

## 2024-02-01 DIAGNOSIS — T82898A Other specified complication of vascular prosthetic devices, implants and grafts, initial encounter: Secondary | ICD-10-CM | POA: Diagnosis not present

## 2024-02-01 DIAGNOSIS — I69322 Dysarthria following cerebral infarction: Secondary | ICD-10-CM | POA: Diagnosis not present

## 2024-02-01 DIAGNOSIS — I132 Hypertensive heart and chronic kidney disease with heart failure and with stage 5 chronic kidney disease, or end stage renal disease: Secondary | ICD-10-CM | POA: Diagnosis present

## 2024-02-01 DIAGNOSIS — E785 Hyperlipidemia, unspecified: Secondary | ICD-10-CM | POA: Diagnosis present

## 2024-02-01 DIAGNOSIS — D696 Thrombocytopenia, unspecified: Secondary | ICD-10-CM | POA: Diagnosis present

## 2024-02-01 DIAGNOSIS — D631 Anemia in chronic kidney disease: Secondary | ICD-10-CM | POA: Diagnosis present

## 2024-02-01 DIAGNOSIS — G4700 Insomnia, unspecified: Secondary | ICD-10-CM | POA: Diagnosis present

## 2024-02-01 DIAGNOSIS — Z961 Presence of intraocular lens: Secondary | ICD-10-CM | POA: Diagnosis present

## 2024-02-01 DIAGNOSIS — E1140 Type 2 diabetes mellitus with diabetic neuropathy, unspecified: Secondary | ICD-10-CM | POA: Diagnosis present

## 2024-02-01 DIAGNOSIS — D6851 Activated protein C resistance: Secondary | ICD-10-CM | POA: Diagnosis present

## 2024-02-01 DIAGNOSIS — N2581 Secondary hyperparathyroidism of renal origin: Secondary | ICD-10-CM | POA: Diagnosis present

## 2024-02-01 DIAGNOSIS — T82510A Breakdown (mechanical) of surgically created arteriovenous fistula, initial encounter: Secondary | ICD-10-CM | POA: Diagnosis present

## 2024-02-01 DIAGNOSIS — Z992 Dependence on renal dialysis: Secondary | ICD-10-CM | POA: Diagnosis not present

## 2024-02-01 DIAGNOSIS — Z794 Long term (current) use of insulin: Secondary | ICD-10-CM | POA: Diagnosis not present

## 2024-02-01 LAB — BASIC METABOLIC PANEL WITH GFR
Anion gap: 12 (ref 5–15)
BUN: 44 mg/dL — ABNORMAL HIGH (ref 8–23)
CO2: 23 mmol/L (ref 22–32)
Calcium: 9.5 mg/dL (ref 8.9–10.3)
Chloride: 100 mmol/L (ref 98–111)
Creatinine, Ser: 9.8 mg/dL — ABNORMAL HIGH (ref 0.44–1.00)
GFR, Estimated: 4 mL/min — ABNORMAL LOW (ref >60)
Glucose, Bld: 110 mg/dL — ABNORMAL HIGH (ref 70–99)
Potassium: 5.6 mmol/L — ABNORMAL HIGH (ref 3.5–5.1)
Sodium: 135 mmol/L (ref 135–145)

## 2024-02-01 LAB — GLUCOSE, CAPILLARY
Glucose-Capillary: 138 mg/dL — ABNORMAL HIGH (ref 70–99)
Glucose-Capillary: 92 mg/dL (ref 70–99)
Glucose-Capillary: 146 mg/dL — ABNORMAL HIGH (ref 70–99)
Glucose-Capillary: 153 mg/dL — ABNORMAL HIGH (ref 70–99)

## 2024-02-01 MED ORDER — ACETAMINOPHEN 325 MG PO TABS
ORAL_TABLET | ORAL | Status: AC
  Filled 2024-02-01: qty 2

## 2024-02-01 MED ORDER — SODIUM ZIRCONIUM CYCLOSILICATE 10 G PO PACK
10.0000 g | PACK | Freq: Once | ORAL | Status: AC
Start: 2024-02-01 — End: 2024-02-01
  Administered 2024-02-01: 10 g via ORAL
  Filled 2024-02-01: qty 1

## 2024-02-01 MED ORDER — PATIROMER SORBITEX CALCIUM 8.4 G PO PACK
25.2000 g | PACK | Freq: Once | ORAL | Status: AC
  Administered 2024-02-01: 25.2 g via ORAL
  Filled 2024-02-01: qty 3

## 2024-02-01 MED ORDER — PENTAFLUOROPROP-TETRAFLUOROETH EX AERO
INHALATION_SPRAY | CUTANEOUS | Status: AC
  Filled 2024-02-01: qty 30

## 2024-02-01 NOTE — Plan of Care (Signed)
  Problem: Education: Goal: Ability to describe self-care measures that may prevent or decrease complications (Diabetes Survival Skills Education) will improve Outcome: Progressing   Problem: Coping: Goal: Ability to adjust to condition or change in health will improve Outcome: Progressing   Problem: Fluid Volume: Goal: Ability to maintain a balanced intake and output will improve Outcome: Progressing   Problem: Metabolic: Goal: Ability to maintain appropriate glucose levels will improve Outcome: Progressing   Problem: Skin Integrity: Goal: Risk for impaired skin integrity will decrease Outcome: Progressing   Problem: Tissue Perfusion: Goal: Adequacy of tissue perfusion will improve Outcome: Progressing   Problem: Education: Goal: Knowledge of General Education information will improve Description: Including pain rating scale, medication(s)/side effects and non-pharmacologic comfort measures Outcome: Progressing   Problem: Activity: Goal: Risk for activity intolerance will decrease Outcome: Progressing   Problem: Nutrition: Goal: Adequate nutrition will be maintained Outcome: Progressing   Problem: Coping: Goal: Level of anxiety will decrease Outcome: Progressing   Problem: Elimination: Goal: Will not experience complications related to bowel motility Outcome: Progressing   Problem: Safety: Goal: Ability to remain free from injury will improve Outcome: Progressing   Problem: Skin Integrity: Goal: Risk for impaired skin integrity will decrease Outcome: Progressing

## 2024-02-01 NOTE — Progress Notes (Signed)
 Patient brought back to floor from dialysis, no complaint at the moment.

## 2024-02-01 NOTE — Progress Notes (Signed)
 Patient left floor on bed , A/O . taken down for dialysis by transport personnel.

## 2024-02-01 NOTE — H&P (View-Only) (Signed)
  Progress Note    02/01/2024 11:23 AM * No surgery found *  Subjective:  Jody Nelson is a 73 yo female  who presents to [facility] emergency department with a suspected left upper extremity fistula that does not work well.   Patient underwent session of hemodialysis yesterday.  Fistula was able to be accessed program with high pressures and is very slow running in order to complete a run of hemodialysis.  Therefore was determined the patient will need a left upper extremity fistulogram.   Patient is resting comfortably in bed in hemodialysis this morning no complaints.  Vitals are remained stable.   Vitals:   02/01/24 1044 02/01/24 1100  BP: 132/69 138/67  Pulse: 83 85  Resp: 14 11  Temp:    SpO2: 100% 100%   Physical Exam: Cardiac:  RRR, S1 and S2.  No murmurs Lungs: Nonlabored breathing lungs clear throughout on auscultation no rales rhonchi or wheezing Incisions: None Extremities: All extremities warm to touch with palpable pulses.  Left upper extremity with AV fistula. Abdomen: Positive bowel sounds throughout, morbidly obese, soft, nontender nondistended. Neurologic: Alert and oriented x 3, answers questions follows commands.  CBC    Component Value Date/Time   WBC 5.7 01/31/2024 0443   RBC 3.39 (L) 01/31/2024 0443   HGB 10.1 (L) 01/31/2024 0443   HCT 31.9 (L) 01/31/2024 0443   PLT 99 (L) 01/31/2024 0443   MCV 94.1 01/31/2024 0443   MCH 29.8 01/31/2024 0443   MCHC 31.7 01/31/2024 0443   RDW 16.4 (H) 01/31/2024 0443   LYMPHSABS 1.6 07/16/2023 2138   MONOABS 1.1 (H) 07/16/2023 2138   EOSABS 0.2 07/16/2023 2138   BASOSABS 0.0 07/16/2023 2138    BMET    Component Value Date/Time   NA 135 02/01/2024 0536   K 5.6 (H) 02/01/2024 0536   CL 100 02/01/2024 0536   CO2 23 02/01/2024 0536   GLUCOSE 110 (H) 02/01/2024 0536   BUN 44 (H) 02/01/2024 0536   CREATININE 9.80 (H) 02/01/2024 0536   CALCIUM  9.5 02/01/2024 0536   GFRNONAA 4 (L) 02/01/2024 0536    INR     Component Value Date/Time   INR 1.6 (H) 03/13/2023 0630     Intake/Output Summary (Last 24 hours) at 02/01/2024 1123 Last data filed at 02/01/2024 0418 Gross per 24 hour  Intake 170 ml  Output 1400 ml  Net -1230 ml     Assessment/Plan:  73 y.o. female who presented to [facility] emergency department yesterday with suspected nonfunctioning left upper extremity AV graft.  * No surgery found *   PLAN Surgery plans on taking the patient to the vascular lab tomorrow on 02/02/2024 for a left upper extremity fistulogram.  Patient had high pressures and a limited run of dialysis today and therefore will need a procedure.  I discussed at the bedside hemodialysis with the patient today the procedure, benefits, risk, complications.  Patient verbalized understanding wishes to proceed.  Patient will remain n.p.o. after midnight tonight for procedure tomorrow patient's current BUN is 44 and creatinine is 9.80 with a GFR of 4.  Patient is a 3 days a week hemodialysis patient.  Patient's potassium this morning was 5.6 but that was prior to hemodialysis today.  Will follow-up on the patient's potassium tomorrow morning before procedure.  DVT prophylaxis: Heparin  with dialysis.   [name] [facility] 02/01/2024 11:23 AM

## 2024-02-01 NOTE — Progress Notes (Addendum)
 Progress Note    Jody Nelson  FMW:[id number] DOB: 09/03/1950  DOA: 01/30/2024 PCP: [facility]      Brief Narrative:    Medical records reviewed and are as summarized below:  Jody Nelson is a 73 y.o. 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      Assessment/Plan:   Principal Problem:   Acute hyperkalemia Active Problems:   Thrombosis of renal dialysis arteriovenous graft   Body mass index is 40.17 kg/m.  (Morbid obesity)   Malfunctioning left arm AV fistula: Plan for fistulogram by vascular surgeon tomorrow. ESRD: She will have hemodialysis today.  Follow-up with nephrologist. Hyperkalemia: Improving, down from 6.5-5.6.  S/p treatment with Lokelma  and Veltassa .  She was also given IV insulin , IV dextrose  and IV calcium  gluconate.  Monitor BMP.   Factor V Leiden mutation, positive anticardiolipin antibody: Eliquis  on hold.   Type II DM: NovoLog  as needed for hyperglycemia. Lantus  on hold.   Comorbidities include chronic thrombocytopenia, peripheral neuropathy, dyslipidemia.    Diet Order             Diet renal with fluid restriction Fluid restriction: 1200 mL Fluid; Room service appropriate? Yes; Fluid consistency: Thin  Diet effective now                                  Consultants: Nephrologist Vascular surgeon  Procedures: None    Medications:    atorvastatin   80 mg Oral QPM   calcitRIOL   0.25 mcg Oral Daily   calcium  acetate  667 mg Oral TID WC   Chlorhexidine  Gluconate Cloth  6 each Topical Q0600   ferrous sulfate   325 mg Oral Daily   folic acid   1 mg Oral Daily   insulin  aspart  0-5 Units Subcutaneous QHS    insulin  aspart  0-6 Units Subcutaneous TID WC   latanoprost   1 drop Both Eyes QHS   loratadine   10 mg Oral Daily   montelukast   10 mg Oral QPM   pantoprazole   40 mg Oral Daily   polyethylene glycol  17 g Oral Daily   pregabalin   25 mg Oral Daily   sodium chloride  flush  3 mL Intravenous Q12H   Continuous Infusions:   Anti-infectives (From admission, onward)    None              Family Communication/Anticipated D/C date and plan/Code Status   DVT prophylaxis: SCDs Start: 01/30/24 1555     Code Status: Full Code  Family Communication: None Disposition Plan: Plan to discharge to nursing   Status is: Observation The patient will require care spanning > 2 midnights and should be moved to inpatient because: Malfunctioning left arm AV fistula       Subjective:   Interval events noted.  No new complaints.  No shortness of breath or chest pain.  Objective:    Vitals:   02/01/24 0113 02/01/24 0716 02/01/24 0800 02/01/24 1021  BP: (!) 94/50 121/62 101/63   Pulse: 97 90 88   Resp: 18 17 16    Temp: 98.2 F (36.8 C) 97.8 F (36.6 C) (!) 97.3 F (36.3 C) 97.8 F (36.6 C)  TempSrc: Oral  Oral Oral  SpO2: 100% 99%    Weight:      Height:       No data found.   Intake/Output Summary (Last 24 hours) at 02/01/2024 1029 Last data filed at 02/01/2024 0418 Gross per 24 hour  Intake 170 ml  Output 1400 ml  Net -1230 ml   Filed Weights   01/30/24 0950  Weight: 130.6 kg    Exam:  GEN: NAD SKIN: Warm and dry EYES: No pallor or icterus ENT: MMM CV: RRR PULM: CTA B ABD: soft, obese, NT, +BS CNS: AAO x 3, non focal EXT: Bilateral leg edema, no tenderness        Data Reviewed:   I have personally reviewed following labs and imaging studies:  Labs: Labs show the following:   Basic Metabolic Panel: Recent Labs  Lab 01/30/24 1402 01/30/24 2010 01/31/24 0443 01/31/24 2002 01/31/24 2245 02/01/24 0536  NA 136 136 135  --   --  135  K 5.9*  5.3* 5.8*   < > 6.1* 5.6*  CL 100 102 100  --   --  100  CO2 24 23 23   --   --  23  GLUCOSE 97 145* 96  --   --  110*  BUN 51* 57* 56*  --   --  44*  CREATININE 11.59* 11.91* 12.02*  --   --  9.80*  CALCIUM  9.9 9.8 9.7  --   --  9.5   < > = values in this interval not displayed.   GFR Estimated Creatinine Clearance: 7.9 mL/min (A) (by C-G formula based on SCr of 9.8 mg/dL (H)). Liver Function Tests: Recent Labs  Lab 01/30/24 1402  AST 13*  ALT 6  ALKPHOS 52  BILITOT 1.2  PROT 7.1  ALBUMIN  3.2*   No results for input(s): LIPASE, AMYLASE in the last 168 hours. No results for input(s): AMMONIA in the last 168 hours. Coagulation profile No results for input(s): INR, PROTIME in the last 168 hours.  CBC: Recent Labs  Lab 01/30/24 1402 01/31/24 0443  WBC 5.4 5.7  HGB 11.5* 10.1*  HCT 36.8 31.9*  MCV 95.3 94.1  PLT 81* 99*   Cardiac Enzymes: No results for input(s): CKTOTAL, CKMB, CKMBINDEX, TROPONINI in the last 168 hours. BNP (last 3 results) No results for input(s): PROBNP in the last 8760 hours. CBG: Recent Labs  Lab 01/31/24 1139 01/31/24 1745 01/31/24 2118 02/01/24 0110 02/01/24 0719  GLUCAP 112* 84 128* 138* 92   D-Dimer: No results for input(s): DDIMER in the last 72 hours. Hgb A1c: No results for input(s): HGBA1C in the last 72 hours. Lipid Profile: No results for input(s): CHOL, HDL, LDLCALC, TRIG, CHOLHDL, LDLDIRECT in the last 72 hours. Thyroid  function studies: No results for input(s): TSH, T4TOTAL, T3FREE, THYROIDAB in the last 72 hours.  Invalid input(s): FREET3 Anemia work up: No results for input(s): VITAMINB12, FOLATE, FERRITIN, TIBC, IRON , RETICCTPCT in the last 72 hours. Sepsis Labs: Recent Labs  Lab 01/30/24 1402 01/31/24 0443  WBC 5.4 5.7    Microbiology Recent Results (from the past 240 hours)  MRSA Next Gen by PCR, Nasal     Status: None   Collection Time: 01/30/24   6:25 PM   Specimen: Nasal Mucosa; Nasal Swab  Result Value Ref Range Status   MRSA by PCR Next Gen NOT DETECTED NOT DETECTED Final    Comment: (NOTE) The GeneXpert MRSA Assay (FDA approved for NASAL specimens only), is one component of a  comprehensive MRSA colonization surveillance program. It is not intended to diagnose MRSA infection nor to guide or monitor treatment for MRSA infections. Test performance is not FDA approved in patients less than 3 years old. Performed at [facility], [address] [place], [place] [address]     Procedures and diagnostic studies:  No results found.             LOS: 0 days   Jody Nelson  [facility]   Pager on [email or web address]. If 7PM-7AM, please contact night-coverage at [email or web address]     02/01/2024, 10:29 AM

## 2024-02-01 NOTE — Progress Notes (Addendum)
 [facility]  ROUNDING NOTE   Subjective:   Jody Nelson 73 y.o. female with  heart failure with preserved ejection fraction, end-stage renal disease, degenerative joint disease, depression, hypertension, sleep apnea, coronary disease, obesity, GERD, diabetes type 2, history of stroke. She presents to ED with dialysis access concerns and has been admitted under observation for Hyperkalemia [E87.5] Acute hyperkalemia [E87.5] Thrombosis of kidney dialysis arteriovenous graft, initial encounter [T82.868A].  Patient is known to our practice and receives outpatient dialysis treatments at [facility] on a MWF schedule, supervised by Dr. Dennise.    Update: Patient seen and evaluated during dialysis   HEMODIALYSIS FLOWSHEET:  Blood Flow Rate (mL/min): 399 mL/min Arterial Pressure (mmHg): -200.59 mmHg Venous Pressure (mmHg): 246.65 mmHg TMP (mmHg): -0.8 mmHg Ultrafiltration Rate (mL/min): 600 mL/min Dialysate Flow Rate (mL/min): 300 ml/min  No complaints to offer today   Objective:  Vital signs in last 24 hours:  Temp:  [97.3 F (36.3 C)-98.2 F (36.8 C)] 97.8 F (36.6 C) (09/24 1021) Pulse Rate:  [76-97] 83 (09/24 1044) Resp:  [10-20] 14 (09/24 1044) BP: (94-149)/(50-94) 132/69 (09/24 1044) SpO2:  [96 %-100 %] 100 % (09/24 1044)  Weight change:  Filed Weights   01/30/24 0950  Weight: 130.6 kg    Intake/Output: I/O last 3 completed shifts: In: 410 [P.O.:360; IV Piggyback:50] Out: 1400 [Other:1400]   Intake/Output this shift:  No intake/output data recorded.  Physical Exam: General: NAD  Head: Normocephalic, atraumatic. Moist oral mucosal membranes  Eyes: Anicteric  Lungs:  Clear to auscultation  Heart: Regular rate and rhythm  Abdomen:  Soft, nontender  Extremities: 1+ peripheral edema.  Neurologic: Awake, alert, conversant  Skin: Warm,dry, no rash  Access: Left upper aVF    Basic Metabolic Panel: Recent Labs  Lab 01/30/24 1402  01/30/24 2010 01/31/24 0443 01/31/24 2002 01/31/24 2245 02/01/24 0536  NA 136 136 135  --   --  135  K 5.9* 5.3* 5.8* 6.5* 6.1* 5.6*  CL 100 102 100  --   --  100  CO2 24 23 23   --   --  23  GLUCOSE 97 145* 96  --   --  110*  BUN 51* 57* 56*  --   --  44*  CREATININE 11.59* 11.91* 12.02*  --   --  9.80*  CALCIUM  9.9 9.8 9.7  --   --  9.5    Liver Function Tests: Recent Labs  Lab 01/30/24 1402  AST 13*  ALT 6  ALKPHOS 52  BILITOT 1.2  PROT 7.1  ALBUMIN  3.2*   No results for input(s): LIPASE, AMYLASE in the last 168 hours. No results for input(s): AMMONIA in the last 168 hours.  CBC: Recent Labs  Lab 01/30/24 1402 01/31/24 0443  WBC 5.4 5.7  HGB 11.5* 10.1*  HCT 36.8 31.9*  MCV 95.3 94.1  PLT 81* 99*    Cardiac Enzymes: No results for input(s): CKTOTAL, CKMB, CKMBINDEX, TROPONINI in the last 168 hours.  BNP: Invalid input(s): POCBNP  CBG: Recent Labs  Lab 01/31/24 1139 01/31/24 1745 01/31/24 2118 02/01/24 0110 02/01/24 0719  GLUCAP 112* 84 128* 138* 92    Microbiology: Results for orders placed or performed during the hospital encounter of 01/30/24  MRSA Next Gen by PCR, Nasal     Status: None   Collection Time: 01/30/24  6:25 PM   Specimen: Nasal Mucosa; Nasal Swab  Result Value Ref Range Status   MRSA by PCR Next Gen NOT DETECTED NOT  DETECTED Final    Comment: (NOTE) The GeneXpert MRSA Assay (FDA approved for NASAL specimens only), is one component of a comprehensive MRSA colonization surveillance program. It is not intended to diagnose MRSA infection nor to guide or monitor treatment for MRSA infections. Test performance is not FDA approved in patients less than 59 years old. Performed at [facility], [address] [place], [place] [address]     Coagulation Studies: No results for input(s): LABPROT, INR in the last 72 hours.  Urinalysis: No results for input(s): COLORURINE, LABSPEC, PHURINE,  GLUCOSEU, HGBUR, BILIRUBINUR, KETONESUR, PROTEINUR, UROBILINOGEN, NITRITE, LEUKOCYTESUR in the last 72 hours.  Invalid input(s): APPERANCEUR    Imaging: No results found.   Medications:     atorvastatin   80 mg Oral QPM   calcitRIOL   0.25 mcg Oral Daily   calcium  acetate  667 mg Oral TID WC   Chlorhexidine  Gluconate Cloth  6 each Topical Q0600   ferrous sulfate   325 mg Oral Daily   folic acid   1 mg Oral Daily   insulin  aspart  0-5 Units Subcutaneous QHS   insulin  aspart  0-6 Units Subcutaneous TID WC   latanoprost   1 drop Both Eyes QHS   loratadine   10 mg Oral Daily   montelukast   10 mg Oral QPM   pantoprazole   40 mg Oral Daily   polyethylene glycol  17 g Oral Daily   pregabalin   25 mg Oral Daily   sodium chloride  flush  3 mL Intravenous Q12H   acetaminophen  **OR** acetaminophen , albuterol , HYDROcodone -acetaminophen   Assessment/ Plan:  [name] is a 73 y.o.  female with  heart failure with preserved ejection fraction, end-stage renal disease, degenerative joint disease, depression, hypertension, sleep apnea, coronary disease, obesity, GERD, diabetes type 2, history of stroke. She presents to ED with dialysis access concerns and has been admitted under observation for Hyperkalemia [E87.5] Acute hyperkalemia [E87.5] Thrombosis of kidney dialysis arteriovenous graft, initial encounter [T82.868A]  [facility]/MWF/left AVF  Nonfunctioning dialysis access, concern at outpatient clinic that access is clotted off.  Vascular surgery has been consulted.  Access positive for bruit and thrill.  Will attempt to use for treatment today and update vascular if HD temp cath is needed. Update: Attempted treatment yesterday, initially ran well, then began alarming, even with decreased blood flow. Vascular  will perform fistulogram on Thursday.  2.  End-stage renal disease on hemodialysis.  Receiving treatment today, UF 1L as tolerated. Next treatment  scheduled for Friday.    3. Anemia of chronic kidney disease Lab Results  Component Value Date   HGB 10.1 (L) 01/31/2024    Hemoglobin 10.1.  Patient does receive Mircera at outpatient clinic.  4.  Hypertension with chronic kidney disease.  Home regimen includes carvedilol .  Blood pressure 138/67 during dialysis  5. Secondary Hyperparathyroidism: with outpatient labs not available  Lab Results  Component Value Date   CALCIUM  9.5 02/01/2024   CAION 1.13 (L) 04/16/2022   PHOS 5.0 (H) 09/23/2023    Patient prescribed calcium  carbonate-cholecalciferol outpatient.  Calcium  stable  6.  Hyperkalemia, potassium 5.6.  Attempted to perform dialysis today however due to malfunctioning access, treatment terminated early.  Will order Lokelma  10 g and Veltassa  25.2 g once to manage elevated levels.   LOS: 0 Rylin Saez 9/24/202510:48 AM

## 2024-02-01 NOTE — Progress Notes (Signed)
  Progress Note    02/01/2024 11:23 AM * No surgery found *  Subjective:  Jody Nelson is a 73 yo female  who presents to [facility] emergency department with a suspected left upper extremity fistula that does not work well.   Patient underwent session of hemodialysis yesterday.  Fistula was able to be accessed program with high pressures and is very slow running in order to complete a run of hemodialysis.  Therefore was determined the patient will need a left upper extremity fistulogram.   Patient is resting comfortably in bed in hemodialysis this morning no complaints.  Vitals are remained stable.   Vitals:   02/01/24 1044 02/01/24 1100  BP: 132/69 138/67  Pulse: 83 85  Resp: 14 11  Temp:    SpO2: 100% 100%   Physical Exam: Cardiac:  RRR, S1 and S2.  No murmurs Lungs: Nonlabored breathing lungs clear throughout on auscultation no rales rhonchi or wheezing Incisions: None Extremities: All extremities warm to touch with palpable pulses.  Left upper extremity with AV fistula. Abdomen: Positive bowel sounds throughout, morbidly obese, soft, nontender nondistended. Neurologic: Alert and oriented x 3, answers questions follows commands.  CBC    Component Value Date/Time   WBC 5.7 01/31/2024 0443   RBC 3.39 (L) 01/31/2024 0443   HGB 10.1 (L) 01/31/2024 0443   HCT 31.9 (L) 01/31/2024 0443   PLT 99 (L) 01/31/2024 0443   MCV 94.1 01/31/2024 0443   MCH 29.8 01/31/2024 0443   MCHC 31.7 01/31/2024 0443   RDW 16.4 (H) 01/31/2024 0443   LYMPHSABS 1.6 07/16/2023 2138   MONOABS 1.1 (H) 07/16/2023 2138   EOSABS 0.2 07/16/2023 2138   BASOSABS 0.0 07/16/2023 2138    BMET    Component Value Date/Time   NA 135 02/01/2024 0536   K 5.6 (H) 02/01/2024 0536   CL 100 02/01/2024 0536   CO2 23 02/01/2024 0536   GLUCOSE 110 (H) 02/01/2024 0536   BUN 44 (H) 02/01/2024 0536   CREATININE 9.80 (H) 02/01/2024 0536   CALCIUM  9.5 02/01/2024 0536   GFRNONAA 4 (L) 02/01/2024 0536    INR     Component Value Date/Time   INR 1.6 (H) 03/13/2023 0630     Intake/Output Summary (Last 24 hours) at 02/01/2024 1123 Last data filed at 02/01/2024 0418 Gross per 24 hour  Intake 170 ml  Output 1400 ml  Net -1230 ml     Assessment/Plan:  73 y.o. female who presented to [facility] emergency department yesterday with suspected nonfunctioning left upper extremity AV graft.  * No surgery found *   PLAN Surgery plans on taking the patient to the vascular lab tomorrow on 02/02/2024 for a left upper extremity fistulogram.  Patient had high pressures and a limited run of dialysis today and therefore will need a procedure.  I discussed at the bedside hemodialysis with the patient today the procedure, benefits, risk, complications.  Patient verbalized understanding wishes to proceed.  Patient will remain n.p.o. after midnight tonight for procedure tomorrow patient's current BUN is 44 and creatinine is 9.80 with a GFR of 4.  Patient is a 3 days a week hemodialysis patient.  Patient's potassium this morning was 5.6 but that was prior to hemodialysis today.  Will follow-up on the patient's potassium tomorrow morning before procedure.  DVT prophylaxis: Heparin  with dialysis.   Jody Nelson [facility] 02/01/2024 11:23 AM

## 2024-02-01 NOTE — Progress Notes (Signed)
 Patient return to floor from dialysis, alert and oriented, call bell given, side table with in reach. Tech ordered lunch.

## 2024-02-02 ENCOUNTER — Encounter: Payer: Self-pay | Admitting: Vascular Surgery

## 2024-02-02 ENCOUNTER — Encounter
Admission: EM | Disposition: A | Discharge status: Skilled Nursing Facility | Payer: Self-pay | Source: Skilled Nursing Facility | Attending: Internal Medicine

## 2024-02-02 DIAGNOSIS — E875 Hyperkalemia: Secondary | ICD-10-CM | POA: Diagnosis not present

## 2024-02-02 DIAGNOSIS — T82858A Stenosis of vascular prosthetic devices, implants and grafts, initial encounter: Secondary | ICD-10-CM

## 2024-02-02 HISTORY — PX: A/V FISTULAGRAM: CATH118298

## 2024-02-02 LAB — BASIC METABOLIC PANEL WITH GFR
Anion gap: 11 (ref 5–15)
BUN: 42 mg/dL — ABNORMAL HIGH (ref 8–23)
CO2: 25 mmol/L (ref 22–32)
Calcium: 9.7 mg/dL (ref 8.9–10.3)
Chloride: 100 mmol/L (ref 98–111)
Creatinine, Ser: 9.97 mg/dL — ABNORMAL HIGH (ref 0.44–1.00)
GFR, Estimated: 4 mL/min — ABNORMAL LOW (ref >60)
Glucose, Bld: 97 mg/dL (ref 70–99)
Potassium: 4.9 mmol/L (ref 3.5–5.1)
Sodium: 136 mmol/L (ref 135–145)

## 2024-02-02 LAB — CBC
HCT: 34 % — ABNORMAL LOW (ref 36.0–46.0)
Hemoglobin: 10.9 g/dL — ABNORMAL LOW (ref 12.0–15.0)
MCH: 29.5 pg (ref 26.0–34.0)
MCHC: 32.1 g/dL (ref 30.0–36.0)
MCV: 92.1 fL (ref 80.0–100.0)
Platelets: 103 K/uL — ABNORMAL LOW (ref 150–400)
RBC: 3.69 MIL/uL — ABNORMAL LOW (ref 3.87–5.11)
RDW: 16.1 % — ABNORMAL HIGH (ref 11.5–15.5)
WBC: 4.6 K/uL (ref 4.0–10.5)
nRBC: 0 % (ref 0.0–0.2)

## 2024-02-02 LAB — GLUCOSE, CAPILLARY
Glucose-Capillary: 125 mg/dL — ABNORMAL HIGH (ref 70–99)
Glucose-Capillary: 91 mg/dL (ref 70–99)
Glucose-Capillary: 83 mg/dL (ref 70–99)
Glucose-Capillary: 142 mg/dL — ABNORMAL HIGH (ref 70–99)

## 2024-02-02 LAB — HEPATITIS B SURFACE ANTIBODY, QUANTITATIVE: Hep B S AB Quant (Post): <3.5 m[IU]/mL — ABNORMAL LOW

## 2024-02-02 SURGERY — A/V FISTULAGRAM
Anesthesia: Moderate Sedation | Laterality: Left

## 2024-02-02 MED ORDER — DIPHENHYDRAMINE HCL 50 MG/ML IJ SOLN: 50.0000 mg | Freq: Once | INTRAMUSCULAR | Status: DC | PRN

## 2024-02-02 MED ORDER — MIDAZOLAM HCL 2 MG/2ML IJ SOLN
INTRAMUSCULAR | Status: AC
  Filled 2024-02-02: qty 2

## 2024-02-02 MED ORDER — HEPARIN SODIUM (PORCINE) 1000 UNIT/ML IJ SOLN
INTRAMUSCULAR | Status: DC | PRN
  Administered 2024-02-02: 4000 [IU] via INTRAVENOUS

## 2024-02-02 MED ORDER — APIXABAN 5 MG PO TABS
5.0000 mg | ORAL_TABLET | Freq: Two times a day (BID) | ORAL | Status: DC
Start: 2024-02-02 — End: 2024-02-03
  Administered 2024-02-02 – 2024-02-03 (×2): 5 mg via ORAL
  Filled 2024-02-02 (×2): qty 1

## 2024-02-02 MED ORDER — HEPARIN (PORCINE) IN NACL 1000-0.9 UT/500ML-% IV SOLN
INTRAVENOUS | Status: DC | PRN
  Administered 2024-02-02: 1000 mL

## 2024-02-02 MED ORDER — LIDOCAINE HCL (PF) 1 % IJ SOLN
INTRAMUSCULAR | Status: DC | PRN
  Administered 2024-02-02 (×2): 5 mL via INTRADERMAL

## 2024-02-02 MED ORDER — SODIUM CHLORIDE 0.9 % IV SOLN: INTRAVENOUS | Status: DC

## 2024-02-02 MED ORDER — FENTANYL CITRATE (PF) 100 MCG/2ML IJ SOLN
INTRAMUSCULAR | Status: DC | PRN
  Administered 2024-02-02: 50 ug via INTRAVENOUS
  Administered 2024-02-02: 25 ug via INTRAVENOUS

## 2024-02-02 MED ORDER — METHYLPREDNISOLONE SODIUM SUCC 125 MG IJ SOLR: 125.0000 mg | Freq: Once | INTRAMUSCULAR | Status: DC | PRN

## 2024-02-02 MED ORDER — CEFAZOLIN SODIUM-DEXTROSE 1-4 GM/50ML-% IV SOLN
1.0000 g | INTRAVENOUS | Status: AC
  Administered 2024-02-02: 1 g via INTRAVENOUS
  Filled 2024-02-02: qty 50

## 2024-02-02 MED ORDER — IODIXANOL 320 MG/ML IV SOLN
INTRAVENOUS | Status: DC | PRN
  Administered 2024-02-02: 25 mL via INTRAVENOUS

## 2024-02-02 MED ORDER — MIDAZOLAM HCL 2 MG/ML PO SYRP: 8.0000 mg | ORAL_SOLUTION | Freq: Once | ORAL | Status: DC | PRN

## 2024-02-02 MED ORDER — MIDAZOLAM HCL 2 MG/2ML IJ SOLN
INTRAMUSCULAR | Status: DC | PRN
  Administered 2024-02-02: 2 mg via INTRAVENOUS
  Administered 2024-02-02: .5 mg via INTRAVENOUS

## 2024-02-02 MED ORDER — HEPARIN SODIUM (PORCINE) 1000 UNIT/ML IJ SOLN
INTRAMUSCULAR | Status: AC
Start: 2024-02-02 — End: 2024-02-02
  Filled 2024-02-02: qty 10

## 2024-02-02 MED ORDER — FENTANYL CITRATE (PF) 100 MCG/2ML IJ SOLN
INTRAMUSCULAR | Status: AC
  Filled 2024-02-02: qty 2

## 2024-02-02 MED ORDER — FAMOTIDINE 20 MG PO TABS: 40.0000 mg | ORAL_TABLET | Freq: Once | ORAL | Status: DC | PRN

## 2024-02-02 MED ORDER — CEFAZOLIN SODIUM-DEXTROSE 1-4 GM/50ML-% IV SOLN
INTRAVENOUS | Status: AC
  Filled 2024-02-02: qty 50

## 2024-02-02 SURGICAL SUPPLY — 13 items
BALLOON DORADO 6X40X80 (BALLOONS) IMPLANT
BALLOON DORADO 8X60X80 (BALLOONS) IMPLANT
COVER PROBE ULTRASOUND 5X96 (MISCELLANEOUS) IMPLANT
DEVICE PRESTO INFLATION (MISCELLANEOUS) IMPLANT
DRAPE BRACHIAL (DRAPES) IMPLANT
GOWN STRL REUS W/ TWL LRG LVL3 (GOWN DISPOSABLE) ×1 IMPLANT
NDL ENTRY 21GA 7CM ECHOTIP (NEEDLE) IMPLANT
NEEDLE ENTRY 21GA 7CM ECHOTIP (NEEDLE) ×1 IMPLANT
PACK ANGIOGRAPHY (CUSTOM PROCEDURE TRAY) ×1 IMPLANT
SET INTRO CAPELLA COAXIAL (SET/KITS/TRAYS/PACK) IMPLANT
SHEATH BRITE TIP 6FRX5.5 (SHEATH) IMPLANT
SUT MNCRL AB 4-0 PS2 18 (SUTURE) IMPLANT
WIRE SUPRACORE 190CM (WIRE) IMPLANT

## 2024-02-02 NOTE — Progress Notes (Signed)
 Progress Note    Jody Nelson  FMW:[id number] DOB: [birth year]  DOA: 01/30/2024 PCP: [facility]      Brief Narrative:    Medical records reviewed and are as summarized below:  Jody Nelson is a 73 y.o. 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      Assessment/Plan:   Principal Problem:   Acute hyperkalemia Active Problems:   Thrombosis of renal dialysis arteriovenous graft   Body mass index is 41.88 kg/m.  (Morbid obesity)   Malfunctioning left arm AV fistula: S/p percutaneous transluminal angioplasty of midportion of the graft on 02/02/2024.  Plan for hemodialysis tomorrow using repaired left AV fistula. ESRD: Follow-up with nephrologist. Hyperkalemia: Improved.   S/p treatment with Lokelma  and Veltassa .  She was also given IV insulin , IV dextrose  and IV calcium  gluconate.  Monitor BMP.   Factor V Leiden mutation, positive anticardiolipin antibody: Resume Eliquis     Type II DM: NovoLog  as needed for hyperglycemia. Lantus  on hold.   Comorbidities include chronic thrombocytopenia, peripheral neuropathy, dyslipidemia.    Diet Order             Diet renal/carb modified with fluid restriction Fluid consistency: Thin  Diet effective now                                  Consultants: Nephrologist Vascular surgeon  Procedures: S/p percutaneous transluminal angioplasty of midportion of left AV graft on 02/02/2024    Medications:    apixaban   5 mg Oral BID   atorvastatin   80 mg Oral QPM   calcitRIOL   0.25 mcg Oral Daily   calcium  acetate  667 mg Oral TID WC   Chlorhexidine  Gluconate Cloth  6 each Topical Q0600   ferrous sulfate   325  mg Oral Daily   folic acid   1 mg Oral Daily   insulin  aspart  0-5 Units Subcutaneous QHS   insulin  aspart  0-6 Units Subcutaneous TID WC   latanoprost   1 drop Both Eyes QHS   loratadine   10 mg Oral Daily   montelukast   10 mg Oral QPM   pantoprazole   40 mg Oral Daily   polyethylene glycol  17 g Oral Daily   pregabalin   25 mg Oral Daily   sodium chloride  flush  3 mL Intravenous Q12H   Continuous Infusions:   Anti-infectives (From admission, onward)    Start     Dose/Rate Route Frequency Ordered Stop   02/02/24 0708  ceFAZolin  (ANCEF ) IVPB 1 g/50 mL premix        1 g 100 mL/hr over 30 Minutes Intravenous 30 min pre-op  02/02/24 0708 02/02/24 0858              Family Communication/Anticipated D/C date and plan/Code Status   DVT prophylaxis: SCDs Start: 01/30/24 1555 apixaban  (ELIQUIS ) tablet 5 mg     Code Status: Full Code  Family Communication: None Disposition Plan: Plan to discharge to nursing  Status is: Inpatient Remains inpatient appropriate because: Dysfunctional left AV fistula.  Needs to have hemodialysis with AV fistula s/p transluminal angioplasty.         Subjective:   No acute events overnight.  She had fistulogram this  morning.  No complaints.  Objective:    Vitals:   02/02/24 0915 02/02/24 0930 02/02/24 0945 02/02/24 0959  BP: 123/73 129/88 133/70 (!) 147/77  Pulse: 86 85  84  Resp: 11 13 14 18   Temp: (!) 96.4 F (35.8 C)  (!) 96.8 F (36 C) (!) 97.5 F (36.4 C)  TempSrc: Temporal  Temporal   SpO2: 97% 98% 95%   Weight:      Height:       No data found.   Intake/Output Summary (Last 24 hours) at 02/02/2024 1335 Last data filed at 02/01/2024 1900 Gross per 24 hour  Intake 240 ml  Output --  Net 240 ml   Filed Weights   01/30/24 0950 02/01/24 1209  Weight: 130.6 kg (!) 136.2 kg    Exam:   GEN: NAD SKIN: Warm and dry EYES: No pallor or icterus ENT: MMM CV: RRR PULM: CTA B ABD: soft, obese, NT, +BS CNS: AAO x 3, non  focal EXT: Bilateral leg edema.  No erythema or tenderness.  Left arm AV fistula with palpable thrill and audible bruit        Data Reviewed:   I have personally reviewed following labs and imaging studies:  Labs: Labs show the following:   Basic Metabolic Panel: Recent Labs  Lab 01/30/24 1402 01/30/24 2010 01/31/24 0443 01/31/24 2002 02/01/24 0536 02/02/24 0710  NA 136 136 135  --  135 136  K 5.9* 5.3* 5.8*   < > 5.6* 4.9  CL 100 102 100  --  100 100  CO2 24 23 23   --  23 25  GLUCOSE 97 145* 96  --  110* 97  BUN 51* 57* 56*  --  44* 42*  CREATININE 11.59* 11.91* 12.02*  --  9.80* 9.97*  CALCIUM  9.9 9.8 9.7  --  9.5 9.7   < > = values in this interval not displayed.   GFR Estimated Creatinine Clearance: 7.9 mL/min (A) (by C-G formula based on SCr of 9.97 mg/dL (H)). Liver Function Tests: Recent Labs  Lab 01/30/24 1402  AST 13*  ALT 6  ALKPHOS 52  BILITOT 1.2  PROT 7.1  ALBUMIN  3.2*   No results for input(s): LIPASE, AMYLASE in the last 168 hours. No results for input(s): AMMONIA in the last 168 hours. Coagulation profile No results for input(s): INR, PROTIME in the last 168 hours.  CBC: Recent Labs  Lab 01/30/24 1402 01/31/24 0443 02/02/24 0710  WBC 5.4 5.7 4.6  HGB 11.5* 10.1* 10.9*  HCT 36.8 31.9* 34.0*  MCV 95.3 94.1 92.1  PLT 81* 99* 103*   Cardiac Enzymes: No results for input(s): CKTOTAL, CKMB, CKMBINDEX, TROPONINI in the last 168 hours. BNP (last 3 results) No results for input(s): PROBNP in the last 8760 hours. CBG: Recent Labs  Lab 02/01/24 1356 02/01/24 1545 02/01/24 2200 02/02/24 0734 02/02/24 0912  GLUCAP 125* 146* 153* 91 83   D-Dimer: No results for input(s): DDIMER in the last 72 hours. Hgb A1c: No results for input(s): HGBA1C in the last 72 hours. Lipid Profile: No results for input(s): CHOL, HDL, LDLCALC, TRIG, CHOLHDL, LDLDIRECT in the last 72 hours. Thyroid  function studies: No  results for input(s): TSH, T4TOTAL, T3FREE, THYROIDAB in the last 72 hours.  Invalid input(s): FREET3 Anemia work up: No results for input(s): VITAMINB12, FOLATE, FERRITIN, TIBC, IRON , RETICCTPCT in the last 72 hours. Sepsis Labs: Recent Labs  Lab 01/30/24 1402 01/31/24 0443 02/02/24 0710  WBC 5.4 5.7 4.6  Microbiology Recent Results (from the past 240 hours)  MRSA Next Gen by PCR, Nasal     Status: None   Collection Time: 01/30/24  6:25 PM   Specimen: Nasal Mucosa; Nasal Swab  Result Value Ref Range Status   MRSA by PCR Next Gen NOT DETECTED NOT DETECTED Final    Comment: (NOTE) The GeneXpert MRSA Assay (FDA approved for NASAL specimens only), is one component of a comprehensive MRSA colonization surveillance program. It is not intended to diagnose MRSA infection nor to guide or monitor treatment for MRSA infections. Test performance is not FDA approved in patients less than 84 years old. Performed at [facility], [address] [place], [place] [address]     Procedures and diagnostic studies:  PERIPHERAL VASCULAR CATHETERIZATION Result Date: 02/02/2024 See surgical note for result.              LOS: 1 day   Jody Nelson  [facility]   Pager on [email or web address]. If 7PM-7AM, please contact night-coverage at [email or web address]     02/02/2024, 1:35 PM

## 2024-02-02 NOTE — Plan of Care (Signed)
   Problem: Coping: Goal: Ability to adjust to condition or change in health will improve Outcome: Progressing   Problem: Fluid Volume: Goal: Ability to maintain a balanced intake and output will improve Outcome: Progressing   Problem: Metabolic: Goal: Ability to maintain appropriate glucose levels will improve Outcome: Progressing   Problem: Nutritional: Goal: Maintenance of adequate nutrition will improve Outcome: Progressing

## 2024-02-02 NOTE — Interval H&P Note (Signed)
 History and Physical Interval Note:  02/02/2024 8:03 AM  Jody Nelson  has presented today for surgery, with the diagnosis of ESRD.  The various methods of treatment have been discussed with the patient and family. After consideration of risks, benefits and other options for treatment, the patient has consented to  Procedure(s): A/V Fistulagram (Left) as a surgical intervention.  The patient's history has been reviewed, patient examined, no change in status, stable for surgery.  I have reviewed the patient's chart and labs.  Questions were answered to the patient's satisfaction.     Cordella Shawl

## 2024-02-02 NOTE — NC FL2 (Signed)
 [place]  MEDICAID FL2 LEVEL OF CARE FORM     IDENTIFICATION  Patient Name: Jody Nelson Birthdate: Nov 07, 1950 Sex: female Admission Date (Current Location): 01/30/2024  [facility] and [place] Number:  Chiropodist and Address:         Provider Number: [id number]  Attending Physician Name and Address:  Jens Durand, MD  Relative Name and Phone Number:       Current Level of Care: Hospital Recommended Level of Care: Skilled Nursing Facility Prior Approval Number:    Date Approved/Denied:   PASRR Number: [id number]  Discharge Plan: SNF    Current Diagnoses: Patient Active Problem List   Diagnosis Date Noted   Acute hyperkalemia 01/30/2024   Thrombosis of renal dialysis arteriovenous graft 01/30/2024   Complication of vascular access for dialysis 09/20/2023   Obesity (BMI 30-39.9) 09/20/2023   Hyponatremia 09/20/2023   Thrombocytopenia 09/20/2023   Complication of vascular access for dialysis, sequela 09/20/2023   Hyperkalemia 09/19/2023   Arteriovenous fistula occlusion 09/19/2023   ESRD (end stage renal disease) (HCC) 03/13/2023   Dyslipidemia 03/13/2023   Type 2 diabetes mellitus with peripheral neuropathy (HCC) 03/13/2023   Abscess of left upper extremity 03/12/2023   Slurred speech 07/23/2022   Acute CVA (cerebrovascular accident) (HCC) 07/22/2022   Multiple open wounds of lower leg 06/17/2022   Wound infection 06/14/2022   Decubitus ulcer of heel, bilateral 06/13/2022   Hypercoagulable state (positive anticardiolipin, elevated homocysteine, factor V Leiden deficiency) 06/13/2022   Osteomyelitis of right leg (HCC) 06/12/2022   Non-healing wound of right lower extremity 06/12/2022   Cellulitis and abscess of right leg 06/12/2022   Chronic anticoagulation 06/12/2022   ESRD on hemodialysis (HCC) 12/27/2021   (HFpEF) heart failure with preserved ejection fraction (HCC) 01/31/2021   Activated protein C resistance 10/02/2020   Hereditary and idiopathic  neuropathy, unspecified 10/02/2020   History of falling 10/02/2020   Insomnia, unspecified 10/02/2020   Muscle weakness (generalized) 10/02/2020   Resistance to multiple antibiotics 10/02/2020   Unspecified abnormalities of gait and mobility 10/02/2020   Unspecified lack of coordination 10/02/2020   Left-sided weakness 10/02/2020   Anemia of chronic renal failure 10/02/2020   Multiple drug resistant organism (MDRO) culture positive 09/25/2020   History of stroke 06/11/2020   Uncontrolled type 2 diabetes mellitus with hyperglycemia, with long-term current use of insulin  (HCC) 06/11/2020   Urinary tract infection 06/11/2020   Heel ulcer, left, limited to breakdown of skin (HCC) 01/04/2020   Leg ulcer, left, limited to breakdown of skin (HCC) 01/04/2020   Lymphedema, not elsewhere classified 01/04/2020   Chest pain, atypical 09/10/2019   Anemia of chronic disease 09/19/2018   Cellulitis of left lower extremity 09/19/2018   Sepsis (HCC) 09/19/2018   Anticardiolipin antibody positive 08/16/2018   Elevated homocysteine 08/16/2018   Heterozygous factor V Leiden mutation 08/16/2018   History of obstructive sleep apnea 07/18/2018   Ischemic stroke (HCC) 07/18/2018   Acute kidney failure, unspecified 07/02/2018   Acute kidney injury superimposed on CKD 06/29/2018   Encounter for other specified surgical aftercare 05/11/2018   Proteus (mirabilis) (morganii) as the cause of diseases classified elsewhere 05/11/2018   CKD (chronic kidney disease) stage 3, GFR 30-59 ml/min (HCC) 03/22/2018   Closed fracture of proximal end of left humerus 03/22/2018   Closed fracture of right tibial plateau with routine healing 03/22/2018   Hyperlipidemia with target LDL less than 70 07/14/2017   Morbid (severe) obesity due to excess calories (HCC) 10/20/2016   Unspecified sequelae of cerebral  infarction 10/20/2016   Unspecified diastolic (congestive) heart failure (HCC) 10/20/2016   Type 2 diabetes mellitus  with hyperlipidemia (HCC) 10/20/2016   Primary osteoarthritis of both knees 05/21/2016   History of influenza 05/04/2016   Dyspnea on effort 03/12/2016   GERD (gastroesophageal reflux disease) 01/27/2016   Calculus of gallbladder without cholecystitis without obstruction 10/21/2015   Pseudophakia of left eye 10/07/2015   Pseudophakia of right eye 08/26/2015   Morbid obesity with BMI of 50.0-59.9, adult (HCC) 07/29/2015   Lymphedema of both lower extremities 07/08/2015   Hallux valgus, acquired 07/08/2015   Onychomycosis due to dermatophyte 07/08/2015   At high risk for falls 12/10/2014   Essential hypertension 12/10/2014   Hypertension associated with type 2 diabetes mellitus (HCC) 12/10/2014   Vitamin D deficiency 11/21/2014   Diabetes mellitus with insulin  therapy (HCC) 10/13/2012   Sarcoidosis 01/31/2012    Orientation RESPIRATION BLADDER Height & Weight     Self, Time, Situation, Place  Normal Continent Weight: (!) 136.2 kg Height:  5' 11 (180.3 cm)  BEHAVIORAL SYMPTOMS/MOOD NEUROLOGICAL BOWEL NUTRITION STATUS      Continent Diet (renal carb modified)  AMBULATORY STATUS COMMUNICATION OF NEEDS Skin   Extensive Assist Verbally Normal                       Personal Care Assistance Level of Assistance              Functional Limitations Info             SPECIAL CARE FACTORS FREQUENCY                       Contractures Contractures Info: Not present    Additional Factors Info  Code Status, Allergies Code Status Info: full Allergies Info: Sulfamethoxazole-trimethoprim           Current Medications (02/02/2024):  This is the current hospital active medication list Current Facility-Administered Medications  Medication Dose Route Frequency Provider Last Rate Last Admin   acetaminophen  (TYLENOL ) tablet 650 mg  650 mg Oral Q6H PRN Schnier, Gregory G, MD   650 mg at 02/01/24 1040   Or   acetaminophen  (TYLENOL ) suppository 650 mg  650 mg Rectal Q6H  PRN Schnier, Gregory G, MD       albuterol  (PROVENTIL ) (2.5 MG/3ML) 0.083% nebulizer solution 2.5 mg  2.5 mg Nebulization Q4H PRN Schnier, Gregory G, MD       apixaban  (ELIQUIS ) tablet 5 mg  5 mg Oral BID Jens Durand, MD       atorvastatin  (LIPITOR ) tablet 80 mg  80 mg Oral QPM Schnier, Gregory G, MD   80 mg at 02/01/24 1854   calcitRIOL  (ROCALTROL ) capsule 0.25 mcg  0.25 mcg Oral Daily Schnier, Gregory G, MD   0.25 mcg at 02/01/24 1003   calcium  acetate (PHOSLO ) capsule 667 mg  667 mg Oral TID WC Schnier, Gregory G, MD   667 mg at 02/02/24 1320   Chlorhexidine  Gluconate Cloth 2 % PADS 6 each  6 each Topical Q0600 Schnier, Cordella MATSU, MD   6 each at 02/02/24 9350   ferrous sulfate  tablet 325 mg  325 mg Oral Daily Schnier, Gregory G, MD   325 mg at 02/02/24 1320   folic acid  (FOLVITE ) tablet 1 mg  1 mg Oral Daily Schnier, Gregory G, MD   1 mg at 02/02/24 1320   HYDROcodone -acetaminophen  (NORCO/VICODIN) 5-325 MG per tablet 1 tablet  1 tablet Oral Q6H PRN  Schnier, Cordella MATSU, MD   1 tablet at 01/31/24 1006   insulin  aspart (novoLOG ) injection 0-5 Units  0-5 Units Subcutaneous QHS Schnier, Cordella MATSU, MD       insulin  aspart (novoLOG ) injection 0-6 Units  0-6 Units Subcutaneous TID WC Schnier, Gregory G, MD       latanoprost  (XALATAN ) 0.005 % ophthalmic solution 1 drop  1 drop Both Eyes QHS Schnier, Cordella MATSU, MD   1 drop at 02/01/24 2257   loratadine  (CLARITIN ) tablet 10 mg  10 mg Oral Daily Schnier, Gregory G, MD   10 mg at 02/02/24 1320   montelukast  (SINGULAIR ) tablet 10 mg  10 mg Oral QPM Schnier, Gregory G, MD   10 mg at 02/01/24 1854   pantoprazole  (PROTONIX ) EC tablet 40 mg  40 mg Oral Daily Schnier, Gregory G, MD   40 mg at 02/01/24 0502   polyethylene glycol (MIRALAX  / GLYCOLAX ) packet 17 g  17 g Oral Daily Schnier, Gregory G, MD   17 g at 02/02/24 1318   pregabalin  (LYRICA ) capsule 25 mg  25 mg Oral Daily Schnier, Gregory G, MD   25 mg at 02/02/24 1320   sodium chloride  flush (NS) 0.9 %  injection 3 mL  3 mL Intravenous Q12H Schnier, Cordella MATSU, MD   3 mL at 02/01/24 2257     Discharge Medications: Please see discharge summary for a list of discharge medications.  Relevant Imaging Results:  Relevant Lab Results:   Additional Information SSN [id number]  Corean ONEIDA Haddock, RN

## 2024-02-02 NOTE — Plan of Care (Signed)
 CHG bath given this AM. Pt has been NPO since midnight    Problem: Education: Goal: Ability to describe self-care measures that may prevent or decrease complications (Diabetes Survival Skills Education) will improve 02/02/2024 0655 by Tobie Harlie NOVAK, RN Outcome: Progressing 02/02/2024 0655 by Tobie Harlie NOVAK, RN Outcome: Progressing Goal: Individualized Educational Video(s) 02/02/2024 0655 by Tobie Harlie NOVAK, RN Outcome: Progressing 02/02/2024 0655 by Tobie Harlie NOVAK, RN Outcome: Progressing   Problem: Coping: Goal: Ability to adjust to condition or change in health will improve Outcome: Progressing   Problem: Fluid Volume: Goal: Ability to maintain a balanced intake and output will improve Outcome: Progressing   Problem: Health Behavior/Discharge Planning: Goal: Ability to identify and utilize available resources and services will improve Outcome: Progressing Goal: Ability to manage health-related needs will improve Outcome: Progressing   Problem: Metabolic: Goal: Ability to maintain appropriate glucose levels will improve Outcome: Progressing   Problem: Nutritional: Goal: Maintenance of adequate nutrition will improve Outcome: Progressing Goal: Progress toward achieving an optimal weight will improve Outcome: Progressing   Problem: Skin Integrity: Goal: Risk for impaired skin integrity will decrease Outcome: Progressing   Problem: Tissue Perfusion: Goal: Adequacy of tissue perfusion will improve Outcome: Progressing   Problem: Education: Goal: Knowledge of General Education information will improve Description: Including pain rating scale, medication(s)/side effects and non-pharmacologic comfort measures Outcome: Progressing   Problem: Health Behavior/Discharge Planning: Goal: Ability to manage health-related needs will improve Outcome: Progressing   Problem: Clinical Measurements: Goal: Ability to maintain clinical measurements within normal  limits will improve Outcome: Progressing Goal: Will remain free from infection Outcome: Progressing Goal: Diagnostic test results will improve Outcome: Progressing Goal: Respiratory complications will improve Outcome: Progressing Goal: Cardiovascular complication will be avoided Outcome: Progressing   Problem: Activity: Goal: Risk for activity intolerance will decrease Outcome: Progressing   Problem: Nutrition: Goal: Adequate nutrition will be maintained Outcome: Progressing   Problem: Coping: Goal: Level of anxiety will decrease Outcome: Progressing   Problem: Elimination: Goal: Will not experience complications related to bowel motility Outcome: Progressing Goal: Will not experience complications related to urinary retention Outcome: Progressing   Problem: Pain Managment: Goal: General experience of comfort will improve and/or be controlled Outcome: Progressing   Problem: Safety: Goal: Ability to remain free from injury will improve Outcome: Progressing   Problem: Skin Integrity: Goal: Risk for impaired skin integrity will decrease Outcome: Progressing

## 2024-02-02 NOTE — Op Note (Signed)
 OPERATIVE NOTE   PROCEDURE: Contrast injection left brachial axillary AV access Percutaneous transluminal angioplasty of the midportion of the graft.  For segment millimeters and the arterial portion of the graft to 6 mm  PRE-OPERATIVE DIAGNOSIS: Complication of dialysis access                                                       End Stage Renal Disease  POST-OPERATIVE DIAGNOSIS: same as above   SURGEON: Cordella JUDITHANN Shawl, M.D.  ANESTHESIA: Conscious sedation was administered under my direct supervision by the interventional radiology RN. IV Versed  plus fentanyl  were utilized. Continuous ECG, pulse oximetry and blood pressure was monitored throughout the entire procedure.  Conscious sedation was for a total of 41 minutes.Jody Nelson BLOOD LOSS: minimal  FINDING(S): Stricture of the AV graft  SPECIMEN(S):  None  CONTRAST: 25 cc  FLUOROSCOPY TIME: 1.9 minutes  INDICATIONS: Jody Nelson is a 73 y.o. female who  presents with malfunctioning left arm AV graft AV access.  The patient is scheduled for angiography with possible intervention of the AV access.  The patient is aware the risks include but are not limited to: bleeding, infection, thrombosis of the cannulated access, and possible anaphylactic reaction to the contrast.  The patient acknowledges if the access can not be salvaged a tunneled catheter will be needed and will be placed during this procedure.  The patient is aware of the risks of the procedure and elects to proceed with the angiogram and intervention.  DESCRIPTION: After full informed written consent was obtained, the patient was brought back to the Special Procedure suite and placed supine position.  Appropriate cardiopulmonary monitors were placed.  The left arm was prepped and draped in the standard fashion.  Appropriate timeout is called. The left brachial axillary AV graft was cannulated with a micropuncture needle in antegrade direction.  Cannulation was performed  with ultrasound guidance. Ultrasound was placed in a sterile sleeve, the AV access was interrogated and noted to be echolucent and compressible indicating patency. Image was recorded for the permanent record. The puncture is performed under continuous ultrasound visualization.   The microwire was advanced and the needle was exchanged for  a microsheath.  The J-wire was then advanced and a 6 Fr sheath inserted.  Hand injections were completed to image the access from the arterial anastomosis through the entire access.  The central venous structures were also imaged by hand injections.  Imaging demonstrates diffuse greater than 50% stenosis in the midportion where the graft is being accessed.  More proximal to this the graft is widely patent.  Previous venous outflow's is widely patent.  Axillary and central veins are widely patent.  Reflux imaging there is a greater than 80% stenosis at the level of the anastomosis.  The visualized portion of the brachial artery widely patent.  Based on the images,  4000 units of heparin  was given and a wire was negotiated through the strictures within the venous portion of the graft.  An 8 mm x 60 mm Dorado balloon was used.  Inflation was to 22 atm.  Follow-up imaging demonstrates complete resolution of the stricture with rapid flow of contrast through the graft, the central venous anatomy is preserved.  The ultrasound was returned to the field and the graft imaged in the more proximal segment.  Lidocaine  was infiltrated the graft was imaged and noted to be echolucent and compressible indicating patency.  Microneedle was inserted into the graft under direct ultrasound visualization.  Microwire followed by micro sheath J-wire followed by a 6 French sheath.  The super core wire was then advanced through the arterial anastomosis and into the distal brachial artery.  A 6 mm x 40 mm Dorado balloon was then advanced across the arterial portion down to the level of the  anastomosis.  Inflation was to 22 atm.  Follow-up imaging demonstrated wide patency which can pleat resolution of the stenosis  Following with H2O angioplasties there is less than 10% residual stenosis throughout with rapid flow of contrast.  A 4-0 Monocryl purse-string suture was sewn around the sheath.  The sheath was removed and light pressure was applied.  A sterile bandage was applied to the puncture site.    COMPLICATIONS: None  CONDITION: Jody Nelson, M.D [facility] Office: [phone]  02/02/2024 9:28 AM

## 2024-02-03 DIAGNOSIS — E875 Hyperkalemia: Secondary | ICD-10-CM | POA: Diagnosis not present

## 2024-02-03 LAB — RENAL FUNCTION PANEL
Albumin: 2.4 g/dL — ABNORMAL LOW (ref 3.5–5.0)
Anion gap: 15 (ref 5–15)
BUN: 49 mg/dL — ABNORMAL HIGH (ref 8–23)
CO2: 24 mmol/L (ref 22–32)
Calcium: 9.9 mg/dL (ref 8.9–10.3)
Chloride: 99 mmol/L (ref 98–111)
Creatinine, Ser: 10.95 mg/dL — ABNORMAL HIGH (ref 0.44–1.00)
GFR, Estimated: 3 mL/min — ABNORMAL LOW (ref >60)
Glucose, Bld: 100 mg/dL — ABNORMAL HIGH (ref 70–99)
Phosphorus: 5.5 mg/dL — ABNORMAL HIGH (ref 2.5–4.6)
Potassium: 5 mmol/L (ref 3.5–5.1)
Sodium: 138 mmol/L (ref 135–145)

## 2024-02-03 LAB — GLUCOSE, CAPILLARY
Glucose-Capillary: 105 mg/dL — ABNORMAL HIGH (ref 70–99)
Glucose-Capillary: 131 mg/dL — ABNORMAL HIGH (ref 70–99)
Glucose-Capillary: 95 mg/dL (ref 70–99)
Glucose-Capillary: 193 mg/dL — ABNORMAL HIGH (ref 70–99)

## 2024-02-03 LAB — CBC
HCT: 32.3 % — ABNORMAL LOW (ref 36.0–46.0)
Hemoglobin: 10.3 g/dL — ABNORMAL LOW (ref 12.0–15.0)
MCH: 29.9 pg (ref 26.0–34.0)
MCHC: 31.9 g/dL (ref 30.0–36.0)
MCV: 93.9 fL (ref 80.0–100.0)
Platelets: 134 K/uL — ABNORMAL LOW (ref 150–400)
RBC: 3.44 MIL/uL — ABNORMAL LOW (ref 3.87–5.11)
RDW: 16.3 % — ABNORMAL HIGH (ref 11.5–15.5)
WBC: 4.5 K/uL (ref 4.0–10.5)
nRBC: 0 % (ref 0.0–0.2)

## 2024-02-03 NOTE — Progress Notes (Signed)
 Pasrr number [id number]

## 2024-02-03 NOTE — Progress Notes (Signed)
 Hemodialysis Note:  Received patient in bed to unit. Alert and oriented. Informed consent singed and in chart.  Treatment initiated: 0814 Treatment completed: 1150  Access used: Left AVF Access issues: None  Patient tolerated well. Transported back to room, alert without acute distress. Report given to patient's RN.  Total UF removed: 2 liters Medications given: None  Post HD weight: 130.7 Kg  [facility]

## 2024-02-03 NOTE — Progress Notes (Signed)
 [facility]  ROUNDING NOTE   Subjective:   Jody Nelson 73 y.o. female with  heart failure with preserved ejection fraction, end-stage renal disease, degenerative joint disease, depression, hypertension, sleep apnea, coronary disease, obesity, GERD, diabetes type 2, history of stroke. She presents to ED with dialysis access concerns and has been admitted under observation for Hyperkalemia [E87.5] Acute hyperkalemia [E87.5] Thrombosis of kidney dialysis arteriovenous graft, initial encounter [T82.868A].  Patient is known to our practice and receives outpatient dialysis treatments at [facility] on a MWF schedule, supervised by Dr. Dennise.    Update:  Patient seen and evaluated during dialysis   HEMODIALYSIS FLOWSHEET:  Blood Flow Rate (mL/min): 399 mL/min Arterial Pressure (mmHg): -261.4 mmHg Venous Pressure (mmHg): 166.25 mmHg TMP (mmHg): 8.89 mmHg Ultrafiltration Rate (mL/min): 828 mL/min Dialysate Flow Rate (mL/min): 300 ml/min  Tolerating treatment well Access functioning well  Objective:  Vital signs in last 24 hours:  Temp:  [97.3 F (36.3 C)-98.1 F (36.7 C)] 97.8 F (36.6 C) (09/26 0756) Pulse Rate:  [79-88] 88 (09/26 1000) Resp:  [11-24] 20 (09/26 1000) BP: (106-146)/(58-77) 107/63 (09/26 1000) SpO2:  [98 %-100 %] 100 % (09/26 1000) Weight:  [131.3 kg] 131.3 kg (09/26 0756)  Weight change:  Filed Weights   01/30/24 0950 02/01/24 1209 02/03/24 0756  Weight: 130.6 kg (!) 136.2 kg 131.3 kg    Intake/Output: I/O last 3 completed shifts: In: 120 [P.O.:120] Out: -    Intake/Output this shift:  No intake/output data recorded.  Physical Exam: General: NAD  Head: Normocephalic, atraumatic. Moist oral mucosal membranes  Eyes: Anicteric  Lungs:  Clear to auscultation  Heart: Regular rate and rhythm  Abdomen:  Soft, nontender  Extremities: 1+ peripheral edema.  Neurologic: Awake, alert, conversant  Skin: Warm,dry, no rash  Access:  Left upper aVF    Basic Metabolic Panel: Recent Labs  Lab 01/30/24 2010 01/31/24 0443 01/31/24 2002 01/31/24 2245 02/01/24 0536 02/02/24 0710 02/03/24 0833  NA 136 135  --   --  135 136 138  K 5.3* 5.8* 6.5* 6.1* 5.6* 4.9 5.0  CL 102 100  --   --  100 100 99  CO2 23 23  --   --  23 25 24   GLUCOSE 145* 96  --   --  110* 97 100*  BUN 57* 56*  --   --  44* 42* 49*  CREATININE 11.91* 12.02*  --   --  9.80* 9.97* 10.95*  CALCIUM  9.8 9.7  --   --  9.5 9.7 9.9  PHOS  --   --   --   --   --   --  5.5*    Liver Function Tests: Recent Labs  Lab 01/30/24 1402 02/03/24 0833  AST 13*  --   ALT 6  --   ALKPHOS 52  --   BILITOT 1.2  --   PROT 7.1  --   ALBUMIN  3.2* 2.4*   No results for input(s): LIPASE, AMYLASE in the last 168 hours. No results for input(s): AMMONIA in the last 168 hours.  CBC: Recent Labs  Lab 01/30/24 1402 01/31/24 0443 02/02/24 0710 02/03/24 0833  WBC 5.4 5.7 4.6 4.5  HGB 11.5* 10.1* 10.9* 10.3*  HCT 36.8 31.9* 34.0* 32.3*  MCV 95.3 94.1 92.1 93.9  PLT 81* 99* 103* 134*    Cardiac Enzymes: No results for input(s): CKTOTAL, CKMB, CKMBINDEX, TROPONINI in the last 168 hours.  BNP: Invalid input(s): POCBNP  CBG: Recent Labs  Lab 02/02/24 0734 02/02/24 0912 02/02/24 1139 02/02/24 1552 02/02/24 2127  GLUCAP 91 83 105* 142* 131*    Microbiology: Results for orders placed or performed during the hospital encounter of 01/30/24  MRSA Next Gen by PCR, Nasal     Status: None   Collection Time: 01/30/24  6:25 PM   Specimen: Nasal Mucosa; Nasal Swab  Result Value Ref Range Status   MRSA by PCR Next Gen NOT DETECTED NOT DETECTED Final    Comment: (NOTE) The GeneXpert MRSA Assay (FDA approved for NASAL specimens only), is one component of a comprehensive MRSA colonization surveillance program. It is not intended to diagnose MRSA infection nor to guide or monitor treatment for MRSA infections. Test performance is not FDA approved  in patients less than 17 years old. Performed at [facility], [address] [place], [place] [address]     Coagulation Studies: No results for input(s): LABPROT, INR in the last 72 hours.  Urinalysis: No results for input(s): COLORURINE, LABSPEC, PHURINE, GLUCOSEU, HGBUR, BILIRUBINUR, KETONESUR, PROTEINUR, UROBILINOGEN, NITRITE, LEUKOCYTESUR in the last 72 hours.  Invalid input(s): APPERANCEUR    Imaging: PERIPHERAL VASCULAR CATHETERIZATION Result Date: 02/02/2024 See surgical note for result.    Medications:     apixaban   5 mg Oral BID   atorvastatin   80 mg Oral QPM   calcitRIOL   0.25 mcg Oral Daily   calcium  acetate  667 mg Oral TID WC   Chlorhexidine  Gluconate Cloth  6 each Topical Q0600   ferrous sulfate   325 mg Oral Daily   folic acid   1 mg Oral Daily   insulin  aspart  0-5 Units Subcutaneous QHS   insulin  aspart  0-6 Units Subcutaneous TID WC   latanoprost   1 drop Both Eyes QHS   loratadine   10 mg Oral Daily   montelukast   10 mg Oral QPM   pantoprazole   40 mg Oral Daily   polyethylene glycol  17 g Oral Daily   pregabalin   25 mg Oral Daily   sodium chloride  flush  3 mL Intravenous Q12H   acetaminophen  **OR** acetaminophen , albuterol , HYDROcodone -acetaminophen   Assessment/ Plan:  Jody Nelson is a 73 y.o.  female with  heart failure with preserved ejection fraction, end-stage renal disease, degenerative joint disease, depression, hypertension, sleep apnea, coronary disease, obesity, GERD, diabetes type 2, history of stroke. She presents to ED with dialysis access concerns and has been admitted under observation for Hyperkalemia [E87.5] Acute hyperkalemia [E87.5] Thrombosis of kidney dialysis arteriovenous graft, initial encounter [T82.868A]  [facility]/MWF/left AVF  Nonfunctioning dialysis access, concern at outpatient clinic that access is clotted off.  Vascular surgery has been consulted.  Access  positive for bruit and thrill.  Will attempt to use for treatment today and update vascular if HD temp cath is needed. Update: Appreciate vascular applying balloon to access stricture and restoring patency.   2.  End-stage renal disease on hemodialysis.  Receiving dialysis today, UF 1.5-2L as tolerated. Next treatment scheduled for Monday. HD access functioning well   3. Anemia of chronic kidney disease Lab Results  Component Value Date   HGB 10.3 (L) 02/03/2024    Hemoglobin 10.3.  Patient does receive Mircera at outpatient clinic.  4.  Hypertension with chronic kidney disease.  Home regimen includes carvedilol .  Blood pressure 107/63 during dialysis  5. Secondary Hyperparathyroidism: with outpatient labs not available  Lab Results  Component Value Date   CALCIUM  9.9 02/03/2024   CAION 1.13 (L) 04/16/2022   PHOS 5.5 (  H) 02/03/2024    Patient prescribed calcium  carbonate-cholecalciferol outpatient.  Calcium  stable  6.  Hyperkalemia, potassium 5.6.  Corrected to 5.0   LOS: 2 Ellie Spickler 9/26/202510:27 AM

## 2024-02-03 NOTE — Discharge Summary (Signed)
 Physician Discharge Summary   Patient: Jody Nelson MRN: [id number] DOB: 11-23-1950  Admit date:     01/30/2024  Discharge date: 02/03/24  Discharge Physician: AIDA CHO   PCP: [facility]   Recommendations at discharge:   Follow-up with physician in the next home within 3 days of discharge Continue outpatient hemodialysis as scheduled on Mondays, Wednesdays and Fridays  Discharge Diagnoses: Principal Problem:   Acute hyperkalemia Active Problems:   Thrombosis of renal dialysis arteriovenous graft  Resolved Problems:   * No resolved hospital problems. [facility] Course:  Jody Nelson is a 73 y.o. 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     Assessment and Plan:  Malfunctioning left arm AV fistula: S/p percutaneous transluminal angioplasty of midportion of the graft on 02/02/2024.   She had hemodialysis today via left arm access without any issues. ESRD: Follow-up with nephrologist for hemodialysis as an outpatient. Hyperkalemia: Improved.   S/p treatment with Lokelma  and Veltassa .  She was also given IV insulin , IV dextrose  and IV calcium  gluconate.  Monitor BMP.     Factor V Leiden mutation, positive anticardiolipin antibody: Continue Eliquis       Type II DM: NovoLog  as needed for hyperglycemia. Lantus  on hold.     Comorbidities include chronic thrombocytopenia, peripheral neuropathy, dyslipidemia.     She is deemed medically stable for discharge today.      Consultants: Nephrologist, vascular surgeon Procedures performed: S/p percutaneous transluminal angioplasty of midportion of the graft on 02/02/2024.  Disposition: Skilled nursing facility Diet  recommendation:  Discharge Diet Orders (From admission, onward)     Start     Ordered   02/03/24 0000  Diet - low sodium heart healthy        02/03/24 1339   02/03/24 0000  Diet renal/carb modified with fluid restriction        02/03/24 1339           Renal diet, renal diet DISCHARGE MEDICATION: Allergies as of 02/03/2024       Reactions   Sulfamethoxazole-trimethoprim    Other reaction(s): Kidney Disorder Other reaction(s): Kidney Disorder Hyperkalemia, AKI Hyperkalemia, AKI        Medication List     STOP taking these medications    aspirin  81 MG chewable tablet   HYDROcodone -acetaminophen  5-325 MG tablet Commonly known as: NORCO/VICODIN   magnesium  hydroxide 400 MG/5ML suspension Commonly known as: MILK OF MAGNESIA       TAKE these medications    acetaminophen  325 MG tablet Commonly known as: TYLENOL  Take 2 tablets by mouth every 8 (eight) hours as needed.   albuterol  108 (90 Base) MCG/ACT inhaler Commonly known as: VENTOLIN  HFA Inhale 1-2 puffs into the lungs every 4 (four) hours as needed for wheezing or shortness of breath.   albuterol  (2.5 MG/3ML) 0.083% nebulizer solution Commonly known as: PROVENTIL  Take 2.5 mg by nebulization every 6 (six) hours as needed for wheezing or shortness of breath.   apixaban  5 MG Tabs tablet Commonly known as: ELIQUIS  Take 1 tablet (5 mg total) by mouth 2 (two) times daily.   atorvastatin  80 MG tablet Commonly known as: LIPITOR  Take 80 mg by mouth daily.   calcitRIOL  0.25 MCG capsule Commonly known as: ROCALTROL  Take 0.25 mcg by mouth  daily.   calcium  acetate 667 MG capsule Commonly known as: PHOSLO  Take one capsule by mouth twice daily with food on Mondays, Wednesdays, and Fridays. Take one capsule 3 times daily on Tuesdays, Thursdays, Saturdays, and Sundays.   carvedilol  12.5 MG tablet Commonly known as: COREG  Take 12.5 mg by mouth 2 (two) times daily.   ciclopirox 0.77 % cream Commonly known as:  LOPROX Apply 1 Application topically daily.   diclofenac  Sodium 1 % Gel Commonly known as: VOLTAREN  Apply 2 g topically every 4 (four) hours as needed (pain). Apply bilateral hips   ferrous sulfate  324 (65 Fe) MG Tbec Take 1 tablet by mouth daily.   fluticasone  50 MCG/ACT nasal spray Commonly known as: FLONASE  Place 1 spray into both nostrils daily.   folic acid  1 MG tablet Commonly known as: FOLVITE  Take 1 mg by mouth daily.   Gerhardt's butt cream Crea Apply 1 Application topically 4 (four) times daily as needed for irritation.   Healthy Eyes Supervision 2 Caps Take 1 capsule by mouth 2 (two) times daily.   insulin  glargine 100 UNIT/ML Solostar Pen Commonly known as: LANTUS  Inject 6 Units into the skin at bedtime.   insulin  lispro 100 UNIT/ML injection Commonly known as: HUMALOG Inject 2-12 Units into the skin in the morning, at noon, and at bedtime. Per sliding scale   latanoprost  0.005 % ophthalmic solution Commonly known as: XALATAN  Place 1 drop into both eyes at bedtime.   lidocaine -prilocaine  cream Commonly known as: EMLA  Apply 1 application  topically 3 (three) times a week. On mon, wed, and friday   loratadine  10 MG tablet Commonly known as: CLARITIN  Take 10 mg by mouth daily.   montelukast  10 MG tablet Commonly known as: SINGULAIR  Take 10 mg by mouth daily.   omeprazole 10 MG capsule Commonly known as: PRILOSEC Take 10 mg by mouth daily.   Oyster Shell Calcium  w/D 500-5 MG-MCG Tabs Take 1 tablet by mouth every Tuesday, Thursday, Saturday, and Sunday.   Ozempic (2 MG/DOSE) 8 MG/3ML Sopn Generic drug: Semaglutide (2 MG/DOSE) Inject 8 mg into the skin once a week. Friday   phenylephrine  0.25 % suppository Commonly known as: (USE for PREPARATION-H) Place 1 suppository rectally 2 (two) times daily as needed for hemorrhoids.   polyethylene glycol 17 g packet Commonly known as: MIRALAX  / GLYCOLAX  Take 17 g by mouth daily.   pregabalin  25 MG  capsule Commonly known as: LYRICA  Take 1 capsule (25 mg total) by mouth daily.   senna-docusate 8.6-50 MG tablet Commonly known as: Senokot-S Take 2 tablets by mouth at bedtime as needed for mild constipation.   torsemide  10 MG tablet Commonly known as: DEMADEX  Take 1 tablet (10 mg total) by mouth daily as needed. 1 tablet (10 mg total) by mouth daily as needed for up to 3 days for increased leg swelling, shortness of breath, weight gain 5+ lbs over 1-2 days. Seek medical care if these symptoms are not improving        Contact information for after-discharge care     Destination     [facility] .   Service: Skilled Nursing Contact information: [address] [place]  [phone]                    Discharge Exam: Fredricka Weights   02/01/24 1209 02/03/24 0756 02/03/24 1150  Weight: (!) 136.2 kg 131.3 kg 130.7 kg   GEN: NAD SKIN: Warm and dry EYES: No pallor or  icterus ENT: MMM CV: RRR PULM: CTA B ABD: soft, obese, NT, +BS CNS: AAO x 3, non focal EXT: No edema or tenderness   Condition at discharge: good  The results of significant diagnostics from this hospitalization (including imaging, microbiology, ancillary and laboratory) are listed below for reference.   Imaging Studies: PERIPHERAL VASCULAR CATHETERIZATION Result Date: 02/02/2024 See surgical note for result.   Microbiology: Results for orders placed or performed during the hospital encounter of 01/30/24  MRSA Next Gen by PCR, Nasal     Status: None   Collection Time: 01/30/24  6:25 PM   Specimen: Nasal Mucosa; Nasal Swab  Result Value Ref Range Status   MRSA by PCR Next Gen NOT DETECTED NOT DETECTED Final    Comment: (NOTE) The GeneXpert MRSA Assay (FDA approved for NASAL specimens only), is one component of a comprehensive MRSA colonization surveillance program. It is not intended to diagnose MRSA infection nor to guide or monitor treatment for MRSA  infections. Test performance is not FDA approved in patients less than 52 years old. Performed at [facility], [address] [place], [place] [address]     Labs: CBC: Recent Labs  Lab 01/30/24 1402 01/31/24 0443 02/02/24 0710 02/03/24 0833  WBC 5.4 5.7 4.6 4.5  HGB 11.5* 10.1* 10.9* 10.3*  HCT 36.8 31.9* 34.0* 32.3*  MCV 95.3 94.1 92.1 93.9  PLT 81* 99* 103* 134*   Basic Metabolic Panel: Recent Labs  Lab 01/30/24 2010 01/31/24 0443 01/31/24 2002 01/31/24 2245 02/01/24 0536 02/02/24 0710 02/03/24 0833  NA 136 135  --   --  135 136 138  K 5.3* 5.8* 6.5* 6.1* 5.6* 4.9 5.0  CL 102 100  --   --  100 100 99  CO2 23 23  --   --  23 25 24   GLUCOSE 145* 96  --   --  110* 97 100*  BUN 57* 56*  --   --  44* 42* 49*  CREATININE 11.91* 12.02*  --   --  9.80* 9.97* 10.95*  CALCIUM  9.8 9.7  --   --  9.5 9.7 9.9  PHOS  --   --   --   --   --   --  5.5*   Liver Function Tests: Recent Labs  Lab 01/30/24 1402 02/03/24 0833  AST 13*  --   ALT 6  --   ALKPHOS 52  --   BILITOT 1.2  --   PROT 7.1  --   ALBUMIN  3.2* 2.4*   CBG: Recent Labs  Lab 02/02/24 0912 02/02/24 1139 02/02/24 1552 02/02/24 2127 02/03/24 1216  GLUCAP 83 105* 142* 131* 95    Discharge time spent: greater than 30 minutes.  Signed: AIDA CHO, MD [facility] 02/03/2024

## 2024-02-03 NOTE — Progress Notes (Addendum)
 Attempted to call report  x2 to [facility]. Unable to reach nurse. Will notify transport.Pt transported via [facility] to facility.

## 2024-02-03 NOTE — Plan of Care (Signed)

## 2024-02-03 NOTE — TOC Transition Note (Signed)
 Transition of Care [facility]) - Discharge Note   Patient Details  Name: Jody Nelson MRN: [id number] Date of Birth: 1951/05/02  [facility] CM/SW Contact:  Alfonso Rummer, LCSW Phone Number: 02/03/2024, 4:34 PM   Clinical Narrative:     [name] [name] and advise of dx plans. Pt agrees to transition back to [facility] Pt will transport via [facility]. TOC signing off   Final next level of care: Skilled Nursing Facility Barriers to Discharge: Barriers Resolved   Patient Goals and CMS Choice            Discharge Placement              Patient chooses bed at: [facility] Patient to be transferred to facility by: [facility] Name of family member notified: [phone]  Cassandria Silvan Patient and family notified of of transfer: 02/03/24  Discharge Plan and Services Additional resources added to the After Visit Summary for                                       Social Drivers of Health (SDOH) Interventions SDOH Screenings   Food Insecurity: No Food Insecurity (01/30/2024)  Housing: Low Risk  (01/30/2024)  Transportation Needs: No Transportation Needs (01/30/2024)  Utilities: Not At Risk (01/30/2024)  Financial Resource Strain: Low Risk  (07/07/2023)   Received from [facility]  Physical Activity: Unknown (07/17/2017)   Received from [facility]  Social Connections: Moderately Isolated (01/30/2024)  Stress: No Stress Concern Present (11/01/2018)   Received from [facility]  Tobacco Use: Medium Risk (01/30/2024)     Readmission Risk Interventions     No data to display

## 2024-02-03 NOTE — Plan of Care (Signed)
  Problem: Fluid Volume: Goal: Ability to maintain a balanced intake and output will improve Outcome: Completed/Met   Problem: Health Behavior/Discharge Planning: Goal: Ability to identify and utilize available resources and services will improve Outcome: Completed/Met Goal: Ability to manage health-related needs will improve Outcome: Completed/Met   Problem: Metabolic: Goal: Ability to maintain appropriate glucose levels will improve Outcome: Completed/Met   Problem: Skin Integrity: Goal: Risk for impaired skin integrity will decrease Outcome: Completed/Met

## 2024-02-03 NOTE — Progress Notes (Signed)
 D/c orders noted. Contacted pt out-pt hd clinic, [facility] MWF, to inform of pt d/c and expected arrival back at clinic 02/06/2024. D/c summary has been faxed over. No further assistance needed at this time.    Suzen Satchel Dialysis Navigator [phone]

## 2024-02-03 NOTE — Care Management Important Message (Signed)
 Important Message  Patient Details  Name: Jody Nelson MRN: [id number] Date of Birth: November 10, 1950   Important Message Given:  Yes - Medicare IM     Jody Nelson 02/03/2024, 4:21 PM

## 2024-04-18 NOTE — Progress Notes (Signed)
 MRN : [id number]  Jody Nelson is a 73 y.o. (12-27-1950) female who presents with chief complaint of check access.  History of Present Illness:  The patient returns to the office for followup status post intervention of their dialysis access on 02/02/2024.   PROCEDURE: Percutaneous transluminal angioplasty of the midportion of the graft.  For segment millimeters and the arterial portion of the graft to 6 mm  Following the intervention the access function has improved, although the flow rates have remained about the same. The patient has not been experiencing increased bleeding times following decannulation and the patient denies increased recirculation.  She does note that some people still have difficulty with accessing her graft but others do just fine.  The patient denies an increase in arm swelling. At the present time the patient denies hand pain.  No recent shortening of the patient's walking distance or new symptoms consistent with claudication.  No history of rest pain symptoms. No new ulcers or wounds of the lower extremities have occurred.  The patient denies amaurosis fugax or recent TIA symptoms. There are no recent neurological changes noted. There is no history of DVT, PE or superficial thrombophlebitis. No recent episodes of angina or shortness of breath documented.   Duplex ultrasound of the AV access shows a patent access.  The previously noted stenosis is improved compared to last study.  Flow volume today is 737 cc/min (previous flow volume was 845 cc/min)    No outpatient medications have been marked as taking for the 04/19/24 encounter (Appointment) with Jama, Cordella MATSU, MD.    Past Medical History:  Diagnosis Date   (HFpEF) heart failure with preserved ejection fraction (HCC)    a.) TTE 10/12/2012: EF >55%, triv PR, G1DD; b.) TTE 10/18/2012: EF >55%, LVH, LAE, triv MT/TR/PR; c.) TTE 06/27/2014: EF >55%, mild MAC, G1DD; d.) TTE 03/18/2017: EF  60-65%, LAE, AoV sclerosis, G1DD; e.) TTE 07/13/2016: EF >55%, LVH, triv MR/TR; f.) TTE 01/17/2019: EF >55%, LVH, GLS -18.1%, triv PR   Anemia of chronic renal failure    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   At high risk for falls    Atypical chest pain    Bilateral lower extremity edema    Chronic right sided weakness    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   a.) MRI brain 02/27/2011: old lacunar infarct in RIGHT pons and medulla oblongata   Cryptogenic stroke (HCC) 10/11/2012   a.) CT brain 10/11/2012: tiny old lacunar infarct in head of caudate nucleus (left)   Cryptogenic stroke (HCC) 10/12/2012   a.) MRI brain 10/12/2012: multiple acute LEFT hemispheric infarcts   Cryptogenic stroke (HCC) 06/26/2014   a.) MRI brain 06/26/2014: acute RIGHT posterior frontal lobe infarct involving both and grey matter   Cryptogenic stroke (HCC) 07/29/2015   a.) MRI brain 07/29/2015: 2 foci of diffusion restriction in the LEFT parietal lobe consistent with acute infarction   Cryptogenic stroke (HCC) 07/24/2017   a.) MRI brain 07/24/2017: punctate focus of acute ischemia in the medial LEFT temporal lobe   DDD (degenerative disc disease), cervical    Depression    DOE (dyspnea on exertion)    Dysarthria as late effect of stroke    ESRD (end stage renal disease) on dialysis (HCC)    a.) M-W-F   GERD (gastroesophageal reflux disease)    Hallux valgus, bilateral    Heterozygous factor V Leiden mutation  07/18/2018   History of cardiac catheterization 08/30/2008   a.) LHC 08/30/2008: EF >60%, LVEDP 18 mmHg, normal coronaries.   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   a.) 07/18/2018 --> 22 mol/L   Hypertension    Insomnia    a.)  melatonin + trazodone  PRN   Long term current use of anticoagulant    a.) apixaban    Lymphedema of both lower extremities    Meralgia paresthetica, left lower limb    OSA (obstructive sleep apnea)    a.) does not require nocturnal PAP therapy   Osteoarthritis    Pneumonia    Pseudophakia of both eyes    Sarcoidosis    Secondary hyperparathyroidism of renal origin    Type 2 diabetes mellitus treated with insulin  (HCC)    Vitamin D deficiency     Past Surgical History:  Procedure Laterality Date   A/V FISTULAGRAM Left 12/28/2022   Procedure: A/V Fistulagram;  Surgeon: Jama Cordella MATSU, MD;  Location: [facility];  Service: Cardiovascular;  Laterality: Left;   A/V FISTULAGRAM Left 02/02/2024   Procedure: A/V Fistulagram;  Surgeon: Jama Cordella MATSU, MD;  Location: [facility];  Service: Cardiovascular;  Laterality: Left;   APPLICATION OF WOUND VAC Right 06/15/2022   Procedure: APPLICATION OF WOUND VAC;  Surgeon: Tobie Priest, MD;  Location: [facility] OR[facility];  Service: Orthopedics;  Laterality: Right;   AV FISTULA PLACEMENT Left 04/16/2022   Procedure: INSERTION OF ARTERIOVENOUS (AV) GORE-TEX GRAFT ARM (BRACHIAL AXILLARY);  Surgeon: Jama Cordella MATSU, MD;  Location: [facility] OR[facility];  Service: Vascular;  Laterality: Left;   CESAREAN SECTION     DIALYSIS/PERMA CATHETER INSERTION N/A 10/14/2021   Procedure: DIALYSIS/PERMA CATHETER INSERTION;  Surgeon: Marea Selinda RAMAN, MD;  Location: [facility];  Service: Cardiovascular;  Laterality: N/A;   DIALYSIS/PERMA CATHETER INSERTION N/A 03/14/2023   Procedure: DIALYSIS/PERMA CATHETER INSERTION;  Surgeon: Marea Selinda RAMAN, MD;  Location: [facility];  Service: Cardiovascular;  Laterality: N/A;   DIALYSIS/PERMA CATHETER REMOVAL N/A 08/02/2022   Procedure: DIALYSIS/PERMA CATHETER REMOVAL;  Surgeon: Marea Selinda RAMAN, MD;  Location: [facility];  Service: Cardiovascular;  Laterality: N/A;   DIALYSIS/PERMA CATHETER REMOVAL N/A 06/27/2023    Procedure: DIALYSIS/PERMA CATHETER REMOVAL;  Surgeon: Marea Selinda RAMAN, MD;  Location: [facility];  Service: Cardiovascular;  Laterality: N/A;   FRACTURE SURGERY     leg right fracture several surgeries, rods   INCISION AND DRAINAGE OF WOUND Right 06/15/2022   Procedure: IRRIGATION AND DEBRIDEMENT WOUND;  Surgeon: Tobie Priest, MD;  Location: [facility];  Service: Orthopedics;  Laterality: Right;   PERIPHERAL VASCULAR THROMBECTOMY N/A 09/23/2023   Procedure: PERIPHERAL VASCULAR THROMBECTOMY;  Surgeon: Marea Selinda RAMAN, MD;  Location: [facility];  Service: Cardiovascular;  Laterality: N/A;   TEMPORARY DIALYSIS CATHETER N/A 09/19/2023   Procedure: TEMPORARY DIALYSIS CATHETER;  Surgeon: Marea Selinda RAMAN, MD;  Location: [facility];  Service: Cardiovascular;  Laterality: N/A;    Social History Social History   Tobacco Use   Smoking status: Former    Current packs/day: 0.00    Types: Cigarettes    Quit date: 2020    Years since quitting: 5.9   Smokeless tobacco: Never  Substance Use Topics   Alcohol use:  Never   Drug use: Never    Family History Family History  Problem Relation Age of Onset   Diabetes Mother    Colon cancer Sister    Diabetes Sister    Cancer Maternal Uncle    Clotting disorder Paternal Grandmother    Colon cancer Paternal Grandfather     Allergies  Allergen Reactions   Sulfamethoxazole-Trimethoprim     Other reaction(s): Kidney Disorder Other reaction(s): Kidney Disorder Hyperkalemia, AKI Hyperkalemia, AKI      REVIEW OF SYSTEMS (Negative unless checked)  Constitutional: [] Weight loss  [] Fever  [] Chills Cardiac: [] Chest pain   [] Chest pressure   [] Palpitations   [] Shortness of breath when laying flat   [] Shortness of breath with exertion. Vascular:  [] Pain in legs with walking   [] Pain in legs at rest  [] History of DVT   [] Phlebitis   [] Swelling in legs   [] Varicose veins   [] Non-healing ulcers Pulmonary:   [] Uses home oxygen   [] Productive  cough   [] Hemoptysis   [] Wheeze  [] COPD   [] Asthma Neurologic:  [] Dizziness   [] Seizures   [] History of stroke   [] History of TIA  [] Aphasia   [] Vissual changes   [] Weakness or numbness in arm   [] Weakness or numbness in leg Musculoskeletal:   [] Joint swelling   [] Joint pain   [] Low back pain Hematologic:  [] Easy bruising  [] Easy bleeding   [] Hypercoagulable state   [] Anemic Gastrointestinal:  [] Diarrhea   [] Vomiting  [] Gastroesophageal reflux/heartburn   [] Difficulty swallowing. Genitourinary:  [x] Chronic kidney disease   [] Difficult urination  [] Frequent urination   [] Blood in urine Skin:  [] Rashes   [] Ulcers  Psychological:  [] History of anxiety   []  History of major depression.  Physical Examination  There were no vitals filed for this visit. There is no height or weight on file to calculate BMI. Gen: WD/WN, NAD Head: /AT, No temporalis wasting.  Ear/Nose/Throat: Hearing grossly intact, nares w/o erythema or drainage Eyes: PER, EOMI, sclera nonicteric.  Neck: Supple, no gross masses or lesions.  No JVD.  Pulmonary:  Good air movement, no audible wheezing, no use of accessory muscles.  Cardiac: RRR, precordium non-hyperdynamic. Vascular:   Left brachial axillary AV graft good thrill good bruit skin appears healthy Vessel Right Left  Radial Palpable Palpable  Brachial Palpable Palpable  Gastrointestinal: soft, non-distended. No guarding/no peritoneal signs.  Musculoskeletal: M/S 5/5 throughout.  No deformity.  Neurologic: CN 2-12 intact. Pain and light touch intact in extremities.  Symmetrical.  Speech is fluent. Motor exam as listed above. Psychiatric: Judgment intact, Mood & affect appropriate for pt's clinical situation. Dermatologic: No rashes or ulcers noted.  No changes consistent with cellulitis.   CBC Lab Results  Component Value Date   WBC 4.5 02/03/2024   HGB 10.3 (L) 02/03/2024   HCT 32.3 (L) 02/03/2024   MCV 93.9 02/03/2024   PLT 134 (L) 02/03/2024    BMET     Component Value Date/Time   NA 138 02/03/2024 0833   K 5.0 02/03/2024 0833   CL 99 02/03/2024 0833   CO2 24 02/03/2024 0833   GLUCOSE 100 (H) 02/03/2024 0833   BUN 49 (H) 02/03/2024 0833   CREATININE 10.95 (H) 02/03/2024 0833   CALCIUM  9.9 02/03/2024 0833   GFRNONAA 3 (L) 02/03/2024 0833   CrCl cannot be calculated (Patient's most recent lab result is older than the maximum 21 days allowed.).  COAG Lab Results  Component Value Date   INR 1.6 (H) 03/13/2023   INR  1.2 07/22/2022   INR 1.8 (H) 03/02/2022    Radiology No results found.   Assessment/Plan 1. ESRD (end stage renal disease) (HCC) (Primary) Recommend:  The patient is doing well and currently has adequate dialysis access.  The patient's dialysis center is not reporting any access issues.  Flow pattern is stable when compared to the prior ultrasound and the previously noted stenosis is now eliminated.  However, I did expect a slight improvement in the flow volume which appears to remain the same.  Given this I will see her back earlier than the typical 6 months and repeat an HDA at that time.  The patient should have a duplex ultrasound of the dialysis access in 3 months. The patient will follow-up with me in the office after each ultrasound   - VAS US  DUPLEX DIALYSIS ACCESS (AVF, AVG); Future  2. Essential hypertension Continue antihypertensive medications as already ordered, these medications have been reviewed and there are no changes at this time.  3. Gastroesophageal reflux disease, unspecified whether esophagitis present Continue PPI as already ordered, this medication has been reviewed and there are no changes at this time.  Avoidence of caffeine and alcohol  Moderate elevation of the head of the bed   4. Diabetes mellitus with insulin  therapy (HCC) Continue hypoglycemic medications as already ordered, these medications have been reviewed and there are no changes at this time.  Hgb A1C to be  monitored as already arranged by primary service  5. Hyperlipidemia with target LDL less than 70 Continue statin as ordered and reviewed, no changes at this time    Cordella Shawl, MD  04/18/2024 4:27 PM

## 2024-04-19 ENCOUNTER — Encounter (INDEPENDENT_AMBULATORY_CARE_PROVIDER_SITE_OTHER): Payer: Self-pay | Admitting: Vascular Surgery

## 2024-04-19 ENCOUNTER — Other Ambulatory Visit (INDEPENDENT_AMBULATORY_CARE_PROVIDER_SITE_OTHER)

## 2024-04-19 ENCOUNTER — Ambulatory Visit (INDEPENDENT_AMBULATORY_CARE_PROVIDER_SITE_OTHER): Admitting: Vascular Surgery

## 2024-04-19 VITALS — BP 135/80 | HR 83 | Resp 18 | Wt 288.0 lb

## 2024-04-19 DIAGNOSIS — N186 End stage renal disease: Secondary | ICD-10-CM

## 2024-04-19 DIAGNOSIS — I10 Essential (primary) hypertension: Secondary | ICD-10-CM

## 2024-04-19 DIAGNOSIS — K219 Gastro-esophageal reflux disease without esophagitis: Secondary | ICD-10-CM | POA: Diagnosis not present

## 2024-04-19 DIAGNOSIS — Z992 Dependence on renal dialysis: Secondary | ICD-10-CM

## 2024-04-19 DIAGNOSIS — E119 Type 2 diabetes mellitus without complications: Secondary | ICD-10-CM

## 2024-04-19 DIAGNOSIS — Z794 Long term (current) use of insulin: Secondary | ICD-10-CM | POA: Diagnosis not present

## 2024-04-19 DIAGNOSIS — E785 Hyperlipidemia, unspecified: Secondary | ICD-10-CM

## 2024-05-18 ENCOUNTER — Telehealth (INDEPENDENT_AMBULATORY_CARE_PROVIDER_SITE_OTHER): Payer: Self-pay

## 2024-05-18 NOTE — Telephone Encounter (Signed)
 I attempted to contact the patient to schedule her for a left arm fistulagram. I was unable to leave a message as the voicemail box was full. I then reached out to the patient's daughter and a message was left for a return call.

## 2024-05-25 ENCOUNTER — Telehealth (INDEPENDENT_AMBULATORY_CARE_PROVIDER_SITE_OTHER): Payer: Self-pay

## 2024-05-25 NOTE — Telephone Encounter (Signed)
 A fax was received from [facility] for the patient to have a declot or permcath insertion. Due to time constraints patient has been scheduled for a permcath insertion on 05/28/24 with a 1:15 pm arrival time to the [facility]. Pre-procedure instructions will be faxed to [facility].

## 2024-05-28 ENCOUNTER — Encounter
Admission: AD | Disposition: A | Discharge status: Nursing Home (Includes ICF) | Payer: Self-pay | Source: Skilled Nursing Facility | Attending: Internal Medicine

## 2024-05-28 ENCOUNTER — Other Ambulatory Visit: Payer: Self-pay

## 2024-05-28 ENCOUNTER — Inpatient Hospital Stay
Admission: AD | Admit: 2024-05-28 | Discharge: 2024-05-31 | DRG: 674 | Disposition: A | Discharge status: Other Acute Inpatient Hospital | Source: Skilled Nursing Facility | Attending: Internal Medicine | Admitting: Internal Medicine

## 2024-05-28 DIAGNOSIS — E559 Vitamin D deficiency, unspecified: Secondary | ICD-10-CM | POA: Diagnosis present

## 2024-05-28 DIAGNOSIS — E66813 Obesity, class 3: Secondary | ICD-10-CM | POA: Diagnosis present

## 2024-05-28 DIAGNOSIS — T82898A Other specified complication of vascular prosthetic devices, implants and grafts, initial encounter: Secondary | ICD-10-CM | POA: Diagnosis not present

## 2024-05-28 DIAGNOSIS — Z6841 Body Mass Index (BMI) 40.0 and over, adult: Secondary | ICD-10-CM

## 2024-05-28 DIAGNOSIS — E875 Hyperkalemia: Secondary | ICD-10-CM | POA: Diagnosis not present

## 2024-05-28 DIAGNOSIS — G8929 Other chronic pain: Secondary | ICD-10-CM | POA: Diagnosis present

## 2024-05-28 DIAGNOSIS — N2581 Secondary hyperparathyroidism of renal origin: Secondary | ICD-10-CM | POA: Diagnosis present

## 2024-05-28 DIAGNOSIS — G4733 Obstructive sleep apnea (adult) (pediatric): Secondary | ICD-10-CM | POA: Diagnosis present

## 2024-05-28 DIAGNOSIS — N186 End stage renal disease: Secondary | ICD-10-CM | POA: Diagnosis not present

## 2024-05-28 DIAGNOSIS — Z79899 Other long term (current) drug therapy: Secondary | ICD-10-CM

## 2024-05-28 DIAGNOSIS — D869 Sarcoidosis, unspecified: Secondary | ICD-10-CM | POA: Diagnosis present

## 2024-05-28 DIAGNOSIS — K219 Gastro-esophageal reflux disease without esophagitis: Secondary | ICD-10-CM | POA: Diagnosis present

## 2024-05-28 DIAGNOSIS — T829XXS Unspecified complication of cardiac and vascular prosthetic device, implant and graft, sequela: Secondary | ICD-10-CM

## 2024-05-28 DIAGNOSIS — Z961 Presence of intraocular lens: Secondary | ICD-10-CM | POA: Diagnosis present

## 2024-05-28 DIAGNOSIS — T82818A Embolism of vascular prosthetic devices, implants and grafts, initial encounter: Secondary | ICD-10-CM | POA: Diagnosis present

## 2024-05-28 DIAGNOSIS — D6851 Activated protein C resistance: Secondary | ICD-10-CM | POA: Diagnosis present

## 2024-05-28 DIAGNOSIS — Z800 Family history of malignant neoplasm of digestive organs: Secondary | ICD-10-CM

## 2024-05-28 DIAGNOSIS — Z833 Family history of diabetes mellitus: Secondary | ICD-10-CM

## 2024-05-28 DIAGNOSIS — Y832 Surgical operation with anastomosis, bypass or graft as the cause of abnormal reaction of the patient, or of later complication, without mention of misadventure at the time of the procedure: Secondary | ICD-10-CM | POA: Diagnosis present

## 2024-05-28 DIAGNOSIS — Z832 Family history of diseases of the blood and blood-forming organs and certain disorders involving the immune mechanism: Secondary | ICD-10-CM

## 2024-05-28 DIAGNOSIS — F411 Generalized anxiety disorder: Secondary | ICD-10-CM | POA: Diagnosis present

## 2024-05-28 DIAGNOSIS — T8241XA Breakdown (mechanical) of vascular dialysis catheter, initial encounter: Secondary | ICD-10-CM | POA: Diagnosis present

## 2024-05-28 DIAGNOSIS — Z992 Dependence on renal dialysis: Secondary | ICD-10-CM | POA: Diagnosis not present

## 2024-05-28 DIAGNOSIS — E1122 Type 2 diabetes mellitus with diabetic chronic kidney disease: Principal | ICD-10-CM | POA: Diagnosis present

## 2024-05-28 DIAGNOSIS — E785 Hyperlipidemia, unspecified: Secondary | ICD-10-CM | POA: Diagnosis present

## 2024-05-28 DIAGNOSIS — I10 Essential (primary) hypertension: Secondary | ICD-10-CM | POA: Diagnosis present

## 2024-05-28 DIAGNOSIS — Z87891 Personal history of nicotine dependence: Secondary | ICD-10-CM

## 2024-05-28 DIAGNOSIS — E119 Type 2 diabetes mellitus without complications: Secondary | ICD-10-CM

## 2024-05-28 DIAGNOSIS — Y712 Prosthetic and other implants, materials and accessory cardiovascular devices associated with adverse incidents: Secondary | ICD-10-CM | POA: Diagnosis present

## 2024-05-28 DIAGNOSIS — Z794 Long term (current) use of insulin: Secondary | ICD-10-CM

## 2024-05-28 DIAGNOSIS — D631 Anemia in chronic kidney disease: Secondary | ICD-10-CM | POA: Diagnosis present

## 2024-05-28 DIAGNOSIS — R5381 Other malaise: Secondary | ICD-10-CM | POA: Diagnosis present

## 2024-05-28 DIAGNOSIS — Z7985 Long-term (current) use of injectable non-insulin antidiabetic drugs: Secondary | ICD-10-CM

## 2024-05-28 DIAGNOSIS — Z9181 History of falling: Secondary | ICD-10-CM

## 2024-05-28 DIAGNOSIS — Z7901 Long term (current) use of anticoagulants: Secondary | ICD-10-CM

## 2024-05-28 DIAGNOSIS — F329 Major depressive disorder, single episode, unspecified: Secondary | ICD-10-CM | POA: Diagnosis present

## 2024-05-28 DIAGNOSIS — I132 Hypertensive heart and chronic kidney disease with heart failure and with stage 5 chronic kidney disease, or end stage renal disease: Secondary | ICD-10-CM | POA: Diagnosis present

## 2024-05-28 DIAGNOSIS — I152 Hypertension secondary to endocrine disorders: Secondary | ICD-10-CM | POA: Diagnosis present

## 2024-05-28 DIAGNOSIS — E1159 Type 2 diabetes mellitus with other circulatory complications: Secondary | ICD-10-CM | POA: Diagnosis present

## 2024-05-28 DIAGNOSIS — I5030 Unspecified diastolic (congestive) heart failure: Secondary | ICD-10-CM | POA: Diagnosis present

## 2024-05-28 DIAGNOSIS — I5032 Chronic diastolic (congestive) heart failure: Secondary | ICD-10-CM | POA: Diagnosis present

## 2024-05-28 HISTORY — PX: TEMPORARY DIALYSIS CATHETER: CATH118312

## 2024-05-28 LAB — GLUCOSE, CAPILLARY
Glucose-Capillary: 95 mg/dL (ref 70–99)
Glucose-Capillary: 43 mg/dL — CL (ref 70–99)
Glucose-Capillary: 98 mg/dL (ref 70–99)

## 2024-05-28 LAB — POTASSIUM (ARMC VASCULAR LAB ONLY): Potassium (ARMC vascular lab): 6.5 mmol/L (ref 3.5–5.1)

## 2024-05-28 LAB — MAGNESIUM: Magnesium: 2.8 mg/dL — ABNORMAL HIGH (ref 1.7–2.4)

## 2024-05-28 LAB — POTASSIUM: Potassium: 7 mmol/L (ref 3.5–5.1)

## 2024-05-28 LAB — HEPATITIS B SURFACE ANTIGEN: Hepatitis B Surface Ag: NONREACTIVE

## 2024-05-28 MED ORDER — FOLIC ACID 1 MG PO TABS
1.0000 mg | ORAL_TABLET | Freq: Every day | ORAL | Status: DC
  Administered 2024-05-29 – 2024-05-31 (×3): 1 mg via ORAL
  Filled 2024-05-28 (×3): qty 1

## 2024-05-28 MED ORDER — CALCITRIOL 0.25 MCG PO CAPS
0.2500 ug | ORAL_CAPSULE | Freq: Every day | ORAL | Status: DC
  Administered 2024-05-29 – 2024-05-31 (×3): 0.25 ug via ORAL
  Filled 2024-05-28 (×3): qty 1

## 2024-05-28 MED ORDER — CALCIUM ACETATE (PHOS BINDER) 667 MG PO CAPS
667.0000 mg | ORAL_CAPSULE | ORAL | Status: DC
  Administered 2024-05-29 – 2024-05-31 (×3): 667 mg via ORAL
  Filled 2024-05-28 (×3): qty 1

## 2024-05-28 MED ORDER — PANTOPRAZOLE SODIUM 40 MG PO TBEC
40.0000 mg | DELAYED_RELEASE_TABLET | Freq: Every day | ORAL | Status: DC
  Administered 2024-05-29 – 2024-05-31 (×3): 40 mg via ORAL
  Filled 2024-05-28 (×3): qty 1

## 2024-05-28 MED ORDER — PROSIGHT PO TABS
1.0000 | ORAL_TABLET | Freq: Two times a day (BID) | ORAL | Status: DC
  Administered 2024-05-29 – 2024-05-31 (×4): 1 via ORAL
  Filled 2024-05-28 (×5): qty 1

## 2024-05-28 MED ORDER — ALTEPLASE 2 MG IJ SOLR: 2.0000 mg | Freq: Once | INTRAMUSCULAR | Status: DC | PRN

## 2024-05-28 MED ORDER — FERROUS SULFATE 325 (65 FE) MG PO TABS
325.0000 mg | ORAL_TABLET | Freq: Every day | ORAL | Status: DC
  Administered 2024-05-29 – 2024-05-31 (×3): 325 mg via ORAL
  Filled 2024-05-28 (×3): qty 1

## 2024-05-28 MED ORDER — CALCIUM ACETATE (PHOS BINDER) 667 MG PO CAPS: 667.0000 mg | ORAL_CAPSULE | ORAL | Status: DC

## 2024-05-28 MED ORDER — FAMOTIDINE 20 MG PO TABS: 40.0000 mg | ORAL_TABLET | Freq: Once | ORAL | Status: DC | PRN

## 2024-05-28 MED ORDER — CHLORHEXIDINE GLUCONATE CLOTH 2 % EX PADS
6.0000 | MEDICATED_PAD | Freq: Every day | CUTANEOUS | Status: DC
  Administered 2024-05-29 – 2024-05-31 (×3): 6 via TOPICAL

## 2024-05-28 MED ORDER — ATORVASTATIN CALCIUM 20 MG PO TABS
80.0000 mg | ORAL_TABLET | Freq: Every day | ORAL | Status: DC
  Administered 2024-05-29 – 2024-05-31 (×3): 80 mg via ORAL
  Filled 2024-05-28 (×3): qty 4

## 2024-05-28 MED ORDER — METHYLPREDNISOLONE SODIUM SUCC 125 MG IJ SOLR: 125.0000 mg | Freq: Once | INTRAMUSCULAR | Status: DC | PRN

## 2024-05-28 MED ORDER — PREGABALIN 25 MG PO CAPS
25.0000 mg | ORAL_CAPSULE | Freq: Every day | ORAL | Status: DC
  Administered 2024-05-29 – 2024-05-31 (×3): 25 mg via ORAL
  Filled 2024-05-28 (×3): qty 1

## 2024-05-28 MED ORDER — CALCIUM GLUCONATE-NACL 1-0.675 GM/50ML-% IV SOLN
1.0000 g | Freq: Once | INTRAVENOUS | Status: DC
  Filled 2024-05-28: qty 50

## 2024-05-28 MED ORDER — INSULIN ASPART 100 UNIT/ML IJ SOLN
0.0000 [IU] | Freq: Three times a day (TID) | INTRAMUSCULAR | Status: DC
  Administered 2024-05-29: 2 [IU] via SUBCUTANEOUS
  Administered 2024-05-30: 1 [IU] via SUBCUTANEOUS
  Filled 2024-05-28: qty 0.09
  Filled 2024-05-28: qty 1

## 2024-05-28 MED ORDER — SODIUM CHLORIDE 0.9% FLUSH
3.0000 mL | Freq: Two times a day (BID) | INTRAVENOUS | Status: DC
  Administered 2024-05-28 – 2024-05-31 (×5): 3 mL via INTRAVENOUS

## 2024-05-28 MED ORDER — TORSEMIDE 20 MG PO TABS: 10.0000 mg | ORAL_TABLET | Freq: Every day | ORAL | Status: DC | PRN

## 2024-05-28 MED ORDER — ONDANSETRON HCL 4 MG/2ML IJ SOLN
4.0000 mg | Freq: Four times a day (QID) | INTRAMUSCULAR | Status: DC | PRN
  Administered 2024-05-29: 4 mg via INTRAVENOUS
  Filled 2024-05-28: qty 2

## 2024-05-28 MED ORDER — DIPHENHYDRAMINE HCL 50 MG/ML IJ SOLN: 50.0000 mg | Freq: Once | INTRAMUSCULAR | Status: DC | PRN

## 2024-05-28 MED ORDER — HYDROMORPHONE HCL 1 MG/ML IJ SOLN
1.0000 mg | Freq: Once | INTRAMUSCULAR | Status: AC | PRN
  Administered 2024-05-29: 1 mg via INTRAVENOUS
  Filled 2024-05-28: qty 1

## 2024-05-28 MED ORDER — ACETAMINOPHEN 325 MG PO TABS: 650.0000 mg | ORAL_TABLET | Freq: Four times a day (QID) | ORAL | Status: DC | PRN

## 2024-05-28 MED ORDER — OYSTER SHELL CALCIUM/D3 500-5 MG-MCG PO TABS
1.0000 | ORAL_TABLET | ORAL | Status: DC
  Administered 2024-05-29 – 2024-05-31 (×2): 1 via ORAL
  Filled 2024-05-28 (×2): qty 1

## 2024-05-28 MED ORDER — MIDAZOLAM HCL 2 MG/ML PO SYRP: 8.0000 mg | ORAL_SOLUTION | Freq: Once | ORAL | Status: DC | PRN

## 2024-05-28 MED ORDER — SENNOSIDES-DOCUSATE SODIUM 8.6-50 MG PO TABS: 1.0000 | ORAL_TABLET | Freq: Every evening | ORAL | Status: DC | PRN

## 2024-05-28 MED ORDER — ACETAMINOPHEN 650 MG RE SUPP: 650.0000 mg | Freq: Four times a day (QID) | RECTAL | Status: DC | PRN

## 2024-05-28 MED ORDER — MONTELUKAST SODIUM 10 MG PO TABS
10.0000 mg | ORAL_TABLET | Freq: Every day | ORAL | Status: DC
  Administered 2024-05-28 – 2024-05-30 (×3): 10 mg via ORAL
  Filled 2024-05-28 (×3): qty 1

## 2024-05-28 MED ORDER — SODIUM CHLORIDE 0.9 % IV SOLN: INTRAVENOUS | Status: DC

## 2024-05-28 MED ORDER — APIXABAN 5 MG PO TABS
5.0000 mg | ORAL_TABLET | Freq: Two times a day (BID) | ORAL | Status: DC
  Administered 2024-05-28 – 2024-05-31 (×6): 5 mg via ORAL
  Filled 2024-05-28 (×6): qty 1

## 2024-05-28 MED ORDER — CEFAZOLIN SODIUM-DEXTROSE 2-4 GM/100ML-% IV SOLN: 2.0000 g | INTRAVENOUS | Status: DC

## 2024-05-28 MED ORDER — HEPARIN SODIUM (PORCINE) 5000 UNIT/ML IJ SOLN
5000.0000 [IU] | Freq: Three times a day (TID) | INTRAMUSCULAR | Status: DC
  Filled 2024-05-28 (×2): qty 1

## 2024-05-28 MED ORDER — LATANOPROST 0.005 % OP SOLN
1.0000 [drp] | Freq: Every day | OPHTHALMIC | Status: DC
  Administered 2024-05-28 – 2024-05-30 (×3): 1 [drp] via OPHTHALMIC
  Filled 2024-05-28: qty 2.5

## 2024-05-28 MED ORDER — HEPARIN SODIUM (PORCINE) 1000 UNIT/ML DIALYSIS
1000.0000 [IU] | INTRAMUSCULAR | Status: DC | PRN
  Administered 2024-05-28 (×4): 1000 [IU]
  Filled 2024-05-28 (×4): qty 1

## 2024-05-28 MED ORDER — HEPARIN SODIUM (PORCINE) 1000 UNIT/ML IJ SOLN
INTRAMUSCULAR | Status: AC
  Filled 2024-05-28: qty 4

## 2024-05-28 MED ORDER — CARVEDILOL 12.5 MG PO TABS
12.5000 mg | ORAL_TABLET | Freq: Two times a day (BID) | ORAL | Status: DC
  Administered 2024-05-28 – 2024-05-30 (×5): 12.5 mg via ORAL
  Filled 2024-05-28 (×6): qty 1

## 2024-05-28 MED ORDER — LIDOCAINE-EPINEPHRINE (PF) 1 %-1:200000 IJ SOLN
INTRAMUSCULAR | Status: DC | PRN
  Administered 2024-05-28: 20 mL

## 2024-05-28 MED ORDER — CALCIUM ACETATE (PHOS BINDER) 667 MG PO CAPS
667.0000 mg | ORAL_CAPSULE | ORAL | Status: DC
  Administered 2024-05-30: 667 mg via ORAL
  Filled 2024-05-28: qty 1

## 2024-05-28 NOTE — Progress Notes (Signed)
 Plan was for permcath with sedation but K was markedly elevated at 7.  Will place a temp cath immediately and she will need admitted to the hospitalists for urgent dialysis.  Would plan permcath in next couple of days once K is better.

## 2024-05-28 NOTE — H&P (Signed)
 " [facility] Admission History & Physical  MRN : [id number]  Jody Nelson is a 74 y.o. ([birth year]) female who presents with chief complaint of No chief complaint on file. SABRA  History of Present Illness: Patient presents for PermCath placement.  Has had multiple interventions on her access and this has now failed.  No fevers or chills.  No current facility-administered medications for this encounter.    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   At high risk for falls    Atypical chest pain    Bilateral lower extremity edema    Chronic right sided weakness    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   a.) MRI brain 02/27/2011: old lacunar infarct in RIGHT pons and medulla oblongata   Cryptogenic stroke (HCC) 10/11/2012   a.) CT brain 10/11/2012: tiny old lacunar infarct in head of caudate nucleus (left)   Cryptogenic stroke (HCC) 10/12/2012   a.) MRI brain 10/12/2012: multiple acute LEFT hemispheric infarcts   Cryptogenic stroke (HCC) 06/26/2014   a.) MRI brain 06/26/2014: acute RIGHT posterior frontal lobe infarct involving both and grey matter   Cryptogenic stroke (HCC) 07/29/2015   a.) MRI brain 07/29/2015: 2 foci of diffusion restriction in the LEFT parietal lobe consistent with acute infarction   Cryptogenic stroke (HCC) 07/24/2017   a.) MRI brain 07/24/2017: punctate focus of acute ischemia in the medial LEFT temporal lobe   DDD (degenerative disc disease), cervical    Depression    DOE (dyspnea on exertion)     Dysarthria as late effect of stroke    ESRD (end stage renal disease) on dialysis (HCC)    a.) M-W-F   GERD (gastroesophageal reflux disease)    Hallux valgus, bilateral    Heterozygous factor V Leiden mutation 07/18/2018   History of cardiac catheterization 08/30/2008   a.) LHC 08/30/2008: EF >60%, LVEDP 18 mmHg, normal coronaries.   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   a.) 07/18/2018 --> 22 mol/L   Hypertension    Insomnia    a.) melatonin + trazodone  PRN   Long term current use of anticoagulant    a.) apixaban    Lymphedema of both lower extremities    Meralgia paresthetica, left lower limb    OSA (obstructive sleep apnea)    a.) does not require nocturnal PAP therapy   Osteoarthritis    Pneumonia    Pseudophakia of both eyes    Sarcoidosis    Secondary hyperparathyroidism of renal origin    Type 2 diabetes mellitus treated with insulin  (HCC)    Vitamin D deficiency     Past Surgical History:  Procedure Laterality Date   A/V FISTULAGRAM Left 12/28/2022   Procedure: A/V Fistulagram;  Surgeon: Jama Cordella MATSU, MD;  Location: [facility] INVASIVE CV LAB;  Service: Cardiovascular;  Laterality: Left;   A/V  FISTULAGRAM Left 02/02/2024   Procedure: A/V Fistulagram;  Surgeon: Jama Cordella MATSU, MD;  Location: [facility] INVASIVE CV LAB;  Service: Cardiovascular;  Laterality: Left;   APPLICATION OF WOUND VAC Right 06/15/2022   Procedure: APPLICATION OF WOUND VAC;  Surgeon: Tobie Priest, MD;  Location: [facility] ORS;  Service: Orthopedics;  Laterality: Right;   AV FISTULA PLACEMENT Left 04/16/2022   Procedure: INSERTION OF ARTERIOVENOUS (AV) GORE-TEX GRAFT ARM (BRACHIAL AXILLARY);  Surgeon: Jama Cordella MATSU, MD;  Location: [facility] ORS;   Service: Vascular;  Laterality: Left;   CESAREAN SECTION     DIALYSIS/PERMA CATHETER INSERTION N/A 10/14/2021   Procedure: DIALYSIS/PERMA CATHETER INSERTION;  Surgeon: Marea Selinda RAMAN, MD;  Location: [facility] INVASIVE CV LAB;  Service: Cardiovascular;  Laterality: N/A;   DIALYSIS/PERMA CATHETER INSERTION N/A 03/14/2023   Procedure: DIALYSIS/PERMA CATHETER INSERTION;  Surgeon: Marea Selinda RAMAN, MD;  Location: [facility] INV[facility];  Service: Cardiovascular;  Laterality: N/A;   DIALYSIS/PERMA CATHETER REMOVAL N/A 08/02/2022   Procedure: DIALYSIS/PERMA CATHETER REMOVAL;  Surgeon: Marea Selinda RAMAN, MD;  Location: [facility];  Service: Cardiovascular;  Laterality: N/A;   DIALYSIS/PERMA CATHETER REMOVAL N/A 06/27/2023   Procedure: DIALYSIS/PERMA CATHETER REMOVAL;  Surgeon: Marea Selinda RAMAN, MD;  Location: [facility] IN[facility];  Service: Cardiovascular;  Laterality: N/A;   FRACTURE SURGERY     leg right fracture several surgeries, rods   INCISION AND DRAINAGE OF WOUND Right 06/15/2022   Procedure: IRRIGATION AND DEBRIDEMENT WOUND;  Surgeon: Tobie Priest, MD;  Location: [facility] OR[facility];  Service: Orthopedics;  Laterality: Right;   PERIPHERAL VASCULAR THROMBECTOMY N/A 09/23/2023   Procedure: PERIPHERAL VASCULAR THROMBECTOMY;  Surgeon: Marea Selinda RAMAN, MD;  Location: [facility] INV[facility];  Service: Cardiovascular;  Laterality: N/A;   TEMPORARY DIALYSIS CATHETER N/A 09/19/2023   Procedure: TEMPORARY DIALYSIS CATHETER;  Surgeon: Marea Selinda RAMAN, MD;  Location: [facility] IN[facility];  Service: Cardiovascular;  Laterality: N/A;     Social History[1]   Family History  Problem Relation Age of Onset   Diabetes Mother    Colon cancer Sister    Diabetes Sister    Cancer Maternal Uncle    Clotting disorder Paternal Grandmother    Colon cancer Paternal Grandfather     Allergies[2]   REVIEW OF SYSTEMS (Negative unless checked)  Constitutional: [] Weight loss  [] Fever  [] Chills Cardiac: [] Chest pain   [] Chest pressure    [x] Palpitations   [] Shortness of breath when laying flat   [] Shortness of breath at rest   [x] Shortness of breath with exertion. Vascular:  [] Pain in legs with walking   [] Pain in legs at rest   [] Pain in legs when laying flat   [] Claudication   [] Pain in feet when walking  [] Pain in feet at rest  [] Pain in feet when laying flat   [] History of DVT   [] Phlebitis   [] Swelling in legs   [] Varicose veins   [] Non-healing ulcers Pulmonary:   [] Uses home oxygen   [] Productive cough   [] Hemoptysis   [] Wheeze  [] COPD   [] Asthma Neurologic:  [] Dizziness  [] Blackouts   [] Seizures   [x] History of stroke   [] History of TIA  [] Aphasia   [] Temporary blindness   [] Dysphagia   [] Weakness or numbness in arms   [] Weakness or numbness in legs Musculoskeletal:  [x] Arthritis   [] Joint swelling   [] Joint pain   [] Low back pain Hematologic:  [] Easy bruising  [] Easy bleeding   [] Hypercoagulable state   [x] Anemic  [] Hepatitis Gastrointestinal:  [] Blood in stool   []   Vomiting blood  [x] Gastroesophageal reflux/heartburn   [] Difficulty swallowing. Genitourinary:  [x] Chronic kidney disease   [] Difficult urination  [] Frequent urination  [] Burning with urination   [] Blood in urine Skin:  [] Rashes   [] Ulcers   [] Wounds Psychological:  [] History of anxiety   []  History of major depression.  Physical Examination  There were no vitals filed for this visit. There is no height or weight on file to calculate BMI. Gen: WD/WN, NAD Head: /AT, No temporalis wasting.  Ear/Nose/Throat: Hearing grossly intact, nares w/o erythema or drainage, oropharynx w/o Erythema/Exudate,  Eyes: Conjunctiva clear, sclera non-icteric Neck: Trachea midline.  No JVD.  Pulmonary:  Good air movement, respirations not labored, no use of accessory muscles.  Cardiac: RRR, normal S1, S2. Vascular:  Vessel Right Left  Radial Palpable Palpable           Musculoskeletal: M/S 5/5 throughout.  Extremities without ischemic changes.  No deformity or atrophy.   Neurologic: Sensation grossly intact in extremities.  Symmetrical.  Speech is fluent. Motor exam as listed above. Psychiatric: Judgment intact, Mood & affect appropriate for pt's clinical situation. Dermatologic: No rashes or ulcers noted.  No cellulitis or open wounds.      CBC Lab Results  Component Value Date   WBC 4.5 02/03/2024   HGB 10.3 (L) 02/03/2024   HCT 32.3 (L) 02/03/2024   MCV 93.9 02/03/2024   PLT 134 (L) 02/03/2024    BMET    Component Value Date/Time   NA 138 02/03/2024 0833   K 5.0 02/03/2024 0833   CL 99 02/03/2024 0833   CO2 24 02/03/2024 0833   GLUCOSE 100 (H) 02/03/2024 0833   BUN 49 (H) 02/03/2024 0833   CREATININE 10.95 (H) 02/03/2024 0833   CALCIUM  9.9 02/03/2024 0833   GFRNONAA 3 (L) 02/03/2024 0833   CrCl cannot be calculated (Patient's most recent lab result is older than the maximum 21 days allowed.).  COAG Lab Results  Component Value Date   INR 1.6 (H) 03/13/2023   INR 1.2 07/22/2022   INR 1.8 (H) 03/02/2022    Radiology No results found.   Assessment/Plan 1.  End-stage renal disease.  For PermCath placement today.  Risks and benefits discussed. 2.  Diabetes.  Underlying cause of renal failure and blood glucose control important in reducing the progression of atherosclerotic disease. Also, involved in wound healing. On appropriate medications. 3.  Hypertension.  An underlying cause of renal failure and blood pressure control important in reducing the progression of atherosclerotic disease. On appropriate oral medications.    Selinda Gu, MD  05/28/2024 12:32 PM        [1]  Social History Tobacco Use   Smoking status: Former    Current packs/day: 0.00    Types: Cigarettes    Quit date: 2020    Years since quitting: 6.0   Smokeless tobacco: Never  Substance Use Topics   Alcohol use: Never   Drug use: Never  [2]  Allergies Allergen Reactions   Sulfamethoxazole-Trimethoprim     Other reaction(s): Kidney  Disorder Other reaction(s): Kidney Disorder Hyperkalemia, AKI Hyperkalemia, AKI    "

## 2024-05-28 NOTE — Progress Notes (Signed)
 Lab draw K+: 7.0. MD made aware. Vascular team in & set up for a Temp cath placement at bedside.

## 2024-05-28 NOTE — Progress Notes (Signed)
 Pt. Tx to dialysis now via stretcher with RN escort, on monitor. Stable for tx. Spoke with admitting hospitalist Dr. Fernand.

## 2024-05-28 NOTE — Progress Notes (Signed)
 Pt receives outpt HD at [facility] MWF at 6:45a. Navigator following to assist with any HD needs.  Suzen Satchel Dialysis Navigator [phone]

## 2024-05-28 NOTE — H&P (Signed)
 " History and Physical    Jody Nelson FMW:[id number] DOB: 05/15/1950 DOA: 05/28/2024  DOS: the patient was seen and examined on 05/28/2024  PCP: [facility]   Patient coming from: SNF  I have personally briefly reviewed patient's old medical records in [facility], [facility] and [facility]  HPI:   Jody Nelson is a 74 y.o. year old female with medical history of hypertension, hyperlipidemia, type 2 diabetes, ESRD on HD MWF, HFpEF presenting from skilled nursing facility after missing HD due to problem with access.  States her fistula had clotted and she was unable to get dialysis on Friday.  She states he is not having any symptoms.  Denies any URI symptoms.  Denies any muscle aches or cramps.  Without any palpitations.  Seen in HD as patient was noted to have severe hyperkalemia on arrival and PermCath was placed.  Nephrology consulted after PermCath placement and initiated emergent dialysis.  Given need for admission, [facility] contacted for admission.  Review of Systems: As mentioned in the history of present illness. All other systems reviewed and are negative.   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   At high risk for falls    Atypical chest pain    Bilateral lower extremity edema    Chronic right sided weakness    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   a.) MRI brain 02/27/2011: old lacunar infarct in RIGHT pons and medulla oblongata   Cryptogenic stroke (HCC) 10/11/2012   a.) CT brain 10/11/2012: tiny old lacunar infarct in head of  caudate nucleus (left)   Cryptogenic stroke (HCC) 10/12/2012   a.) MRI brain 10/12/2012: multiple acute LEFT hemispheric infarcts   Cryptogenic stroke (HCC) 06/26/2014   a.) MRI brain 06/26/2014: acute RIGHT posterior frontal lobe infarct involving both and grey matter   Cryptogenic stroke (HCC) 07/29/2015   a.) MRI brain 07/29/2015: 2 foci of diffusion restriction in the LEFT parietal lobe consistent with acute infarction   Cryptogenic stroke (HCC) 07/24/2017   a.) MRI brain 07/24/2017: punctate focus of acute ischemia in the medial LEFT temporal lobe   DDD (degenerative disc disease), cervical    Depression    DOE (dyspnea on exertion)    Dysarthria as late effect of stroke    ESRD (end stage renal disease) on dialysis (HCC)    a.) M-W-F   GERD (gastroesophageal reflux disease)    Hallux valgus, bilateral    Heterozygous factor V Leiden mutation 07/18/2018   History of cardiac catheterization 08/30/2008   a.) LHC 08/30/2008: EF >60%, LVEDP 18 mmHg, normal coronaries.   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   a.) 07/18/2018 --> 22 mol/L   Hypertension    Insomnia    a.) melatonin + trazodone  PRN   Long term current use of anticoagulant    a.) apixaban    Lymphedema  of both lower extremities    Meralgia paresthetica, left lower limb    OSA (obstructive sleep apnea)    a.) does not require nocturnal PAP therapy   Osteoarthritis    Pneumonia    Pseudophakia of both eyes    Sarcoidosis    Secondary hyperparathyroidism of renal origin    Type 2 diabetes mellitus treated with insulin  (HCC)    Vitamin D deficiency     Past Surgical History:  Procedure Laterality Date   A/V FISTULAGRAM Left 12/28/2022   Procedure: A/V  Fistulagram;  Surgeon: Jama Cordella MATSU, MD;  Location: [facility];  Service: Cardiovascular;  Laterality: Left;   A/V FISTULAGRAM Left 02/02/2024   Procedure: A/V Fistulagram;  Surgeon: Jama Cordella MATSU, MD;  Location: [facility];  Service: Cardiovascular;  Laterality: Left;   APPLICATION OF WOUND VAC Right 06/15/2022   Procedure: APPLICATION OF WOUND VAC;  Surgeon: Tobie Priest, MD;  Location: [facility];  Service: Orthopedics;  Laterality: Right;   AV FISTULA PLACEMENT Left 04/16/2022   Procedure: INSERTION OF ARTERIOVENOUS (AV) GORE-TEX GRAFT ARM (BRACHIAL AXILLARY);  Surgeon: Jama Cordella MATSU, MD;  Location: [facility];  Service: Vascular;  Laterality: Left;   CESAREAN SECTION     DIALYSIS/PERMA CATHETER INSERTION N/A 10/14/2021   Procedure: DIALYSIS/PERMA CATHETER INSERTION;  Surgeon: Marea Selinda RAMAN, MD;  Location: [facility];  Service: Cardiovascular;  Laterality: N/A;   DIALYSIS/PERMA CATHETER INSERTION N/A 03/14/2023   Procedure: DIALYSIS/PERMA CATHETER INSERTION;  Surgeon: Marea Selinda RAMAN, MD;  Location: [facility];  Service: Cardiovascular;  Laterality: N/A;   DIALYSIS/PERMA CATHETER REMOVAL N/A 08/02/2022   Procedure: DIALYSIS/PERMA CATHETER REMOVAL;  Surgeon: Marea Selinda RAMAN, MD;  Location: [facility];  Service: Cardiovascular;  Laterality: N/A;   DIALYSIS/PERMA CATHETER REMOVAL N/A 06/27/2023   Procedure: DIALYSIS/PERMA CATHETER REMOVAL;  Surgeon: Marea Selinda RAMAN, MD;  Location: [facility];  Service: Cardiovascular;  Laterality: N/A;   FRACTURE SURGERY     leg right fracture several surgeries, rods   INCISION AND DRAINAGE OF WOUND Right 06/15/2022   Procedure: IRRIGATION AND DEBRIDEMENT WOUND;  Surgeon: Tobie Priest, MD;  Location: [facility];  Service: Orthopedics;  Laterality: Right;   PERIPHERAL VASCULAR THROMBECTOMY N/A 09/23/2023   Procedure: PERIPHERAL VASCULAR THROMBECTOMY;  Surgeon: Marea Selinda RAMAN, MD;  Location: [facility];   Service: Cardiovascular;  Laterality: N/A;   TEMPORARY DIALYSIS CATHETER N/A 09/19/2023   Procedure: TEMPORARY DIALYSIS CATHETER;  Surgeon: Marea Selinda RAMAN, MD;  Location: [facility];  Service: Cardiovascular;  Laterality: N/A;     Allergies[1]  Family History  Problem Relation Age of Onset   Diabetes Mother    Colon cancer Sister    Diabetes Sister    Cancer Maternal Uncle    Clotting disorder Paternal Grandmother    Colon cancer Paternal Grandfather     Prior to Admission medications  Medication Sig Start Date End Date Taking? Authorizing Provider  albuterol  (VENTOLIN  HFA) 108 (90 Base) MCG/ACT inhaler Inhale 1-2 puffs into the lungs every 4 (four) hours as needed for wheezing or shortness of breath.   Yes [provider]  apixaban  (ELIQUIS ) 5 MG TABS tablet Take 1 tablet (5 mg total) by mouth 2 (two) times daily. 07/27/22  Yes Maree Hue, MD  atorvastatin  (LIPITOR ) 80 MG tablet Take 80 mg by mouth daily. 02/19/21  Yes [provider]  calcitRIOL  (ROCALTROL ) 0.25 MCG capsule Take 0.25 mcg by mouth daily.  10/22/21  Yes [provider]  calcium  acetate (PHOSLO ) 667 MG capsule Take one capsule by mouth twice daily with food on Mondays, Wednesdays, and Fridays. Take one capsule 3 times daily on Tuesdays, Thursdays, Saturdays, and Sundays. 06/10/22  Yes [provider]  Calcium  Carb-Cholecalciferol  (OYSTER SHELL CALCIUM  W/D) 500-5 MG-MCG TABS Take 1 tablet by mouth every Tuesday, Thursday, Saturday, and Sunday. 02/19/21  Yes [provider]  carvedilol  (COREG ) 12.5 MG tablet Take 12.5 mg by mouth 2 (two) times daily. 10/27/23  Yes [provider]  folic acid  (FOLVITE ) 1 MG tablet Take 1 mg by mouth daily. 02/19/21  Yes [provider]  latanoprost  (XALATAN ) 0.005 % ophthalmic solution Place 1 drop into both eyes at bedtime. 02/08/22  Yes [provider]  lidocaine -prilocaine  (EMLA ) cream Apply 1 application  topically  3 (three) times a week. On mon, wed, and friday   Yes [provider]  montelukast  (SINGULAIR ) 10 MG tablet Take 10 mg by mouth daily. 02/19/21  Yes [provider]  Multiple Vitamins-Minerals (HEALTHY EYES SUPERVISION 2) CAPS Take 1 capsule by mouth 2 (two) times daily.   Yes [provider]  Nystatin (GERHARDT'S BUTT CREAM) CREA Apply 1 Application topically 4 (four) times daily as needed for irritation. 09/24/23  Yes Alexander, Natalie, DO  omeprazole (PRILOSEC) 10 MG capsule Take 10 mg by mouth daily. 02/25/22  Yes [provider]  phenylephrine  (,USE FOR PREPARATION-H,) 0.25 % suppository Place 1 suppository rectally 2 (two) times daily as needed for hemorrhoids. 09/24/23  Yes Alexander, Natalie, DO  polyethylene glycol (MIRALAX  / GLYCOLAX ) 17 g packet Take 17 g by mouth daily.   Yes [provider]  pregabalin  (LYRICA ) 25 MG capsule Take 1 capsule (25 mg total) by mouth daily. 09/24/23  Yes Alexander, Natalie, DO  torsemide  (DEMADEX ) 10 MG tablet Take 1 tablet (10 mg total) by mouth daily as needed. 1 tablet (10 mg total) by mouth daily as needed for up to 3 days for increased leg swelling, shortness of breath, weight gain 5+ lbs over 1-2 days. Seek medical care if these symptoms are not improving 09/24/23  Yes Alexander, Natalie, DO  acetaminophen  (TYLENOL ) 325 MG tablet Take 2 tablets by mouth every 8 (eight) hours as needed. Patient not taking: Reported on 05/28/2024    [provider]  albuterol  (PROVENTIL ) (2.5 MG/3ML) 0.083% nebulizer solution Take 2.5 mg by nebulization every 6 (six) hours as needed for wheezing or shortness of breath. Patient not taking: Reported on 05/28/2024 10/21/22   [provider]  ciclopirox (LOPROX) 0.77 % cream Apply 1 Application topically daily. Patient not taking: Reported on 04/19/2024    [provider]  diclofenac  Sodium (VOLTAREN ) 1 % GEL Apply 2 g topically every 4 (four) hours as needed  (pain). Apply bilateral hips    [provider]  ferrous sulfate  324 (65 Fe) MG TBEC Take 1 tablet by mouth daily. 02/19/21   [provider]  fluticasone  (FLONASE ) 50 MCG/ACT nasal spray Place 1 spray into both nostrils daily. Patient not taking: Reported on 05/28/2024 10/18/22   [provider]  HYDROcodone -acetaminophen  (NORCO/VICODIN) 5-325 MG tablet Take 1 tablet by mouth every 6 (six) hours as needed for moderate pain (pain score 4-6).    [provider]  insulin  glargine (LANTUS ) 100 UNIT/ML Solostar Pen Inject 6 Units into the skin at bedtime. Patient taking differently: Inject 4 Units into the skin at bedtime. 02/17/21   [provider]  insulin  lispro (HUMALOG)  100 UNIT/ML injection Inject 2-12 Units into the skin in the morning, at noon, and at bedtime. Per sliding scale    [provider]  loratadine  (CLARITIN ) 10 MG tablet Take 10 mg by mouth every other day.    [provider]  OZEMPIC, 2 MG/DOSE, 8 MG/3ML SOPN Inject 8 mg into the skin once a week. Friday 08/26/23   [provider]  senna-docusate (SENOKOT-S) 8.6-50 MG tablet Take 2 tablets by mouth at bedtime as needed for mild constipation. 09/24/23   Alexander, Natalie, DO  shark liver oil-cocoa butter (PREPARATION H) 0.25-3-85.5 % suppository Place 1 suppository rectally as needed for hemorrhoids.    [provider]    Social History:  reports that she quit smoking about 6 years ago. Her smoking use included cigarettes. She has never used smokeless tobacco. She reports that she does not drink alcohol and does not use drugs.    Physical Exam: Vitals:   05/28/24 1303 05/28/24 1616  BP: 124/64 (!) 152/68  Pulse: 80 81  Resp: 20 20  Temp: 97.9 F (36.6 C) 97.6 F (36.4 C)  TempSrc: Oral Oral  SpO2: 94% 100%  Weight: 128.4 kg 128.3 kg  Height: 5' 4 (1.626 m)     Gen: NAD HENT: NCAT CV: normal heart sounds Lung: CTAB Abd: No TTP, normal  bowel sounds MSK: No asymmetry, catheter in right groin Neuro: alert and oriented x 4   Labs on Admission: I have personally reviewed following labs and imaging studies  CBC: No results for input(s): WBC, NEUTROABS, HGB, HCT, MCV, PLT in the last 168 hours. Basic Metabolic Panel: Recent Labs  Lab 05/28/24 1405  K 7.0*   GFR: CrCl cannot be calculated (Patient's most recent lab result is older than the maximum 21 days allowed.). Liver Function Tests: No results for input(s): AST, ALT, ALKPHOS, BILITOT, PROT, ALBUMIN  in the last 168 hours. No results for input(s): LIPASE, AMYLASE in the last 168 hours. No results for input(s): AMMONIA in the last 168 hours. Coagulation Profile: No results for input(s): INR, PROTIME in the last 168 hours. Cardiac Enzymes: No results for input(s): CKTOTAL, CKMB, CKMBINDEX, TROPONINI, TROPONINIHS in the last 168 hours. BNP (last 3 results) No results for input(s): BNP in the last 8760 hours. HbA1C: No results for input(s): HGBA1C in the last 72 hours. CBG: Recent Labs  Lab 05/28/24 1335  GLUCAP 95   Lipid Profile: No results for input(s): CHOL, HDL, LDLCALC, TRIG, CHOLHDL, LDLDIRECT in the last 72 hours. Thyroid  Function Tests: No results for input(s): TSH, T4TOTAL, FREET4, T3FREE, THYROIDAB in the last 72 hours. Anemia Panel: No results for input(s): VITAMINB12, FOLATE, FERRITIN, TIBC, IRON , RETICCTPCT in the last 72 hours. Urine analysis: No results found for: COLORURINE, APPEARANCEUR, LABSPEC, PHURINE, GLUCOSEU, HGBUR, BILIRUBINUR, KETONESUR, PROTEINUR, UROBILINOGEN, NITRITE, LEUKOCYTESUR  Radiological Exams on Admission: I have personally reviewed images PERIPHERAL VASCULAR CATHETERIZATION Result Date: 05/28/2024 See surgical note for result.   EKG: My personal interpretation of EKG shows: Pending  Assessment/Plan Principal  Problem:   [facility] Active Problems:   Acute hyperkalemia   (HFpEF) heart failure with preserved ejection fraction (HCC)   Type II diabetes mellitus (HCC)   GERD (gastroesophageal reflux disease)   Hyperlipidemia with target LDL less than 70   Hypertension associated with type 2 diabetes mellitus (HCC)   Vitamin D deficiency   ESRD needing dialysis/Severe hyperkalemia: Patient presented after missing dialysis on Friday for new access and was found to be severely hyperkalemic  and underwent emergent dialysis.  Repeat renal panel pending.  Will follow-up on potassium. Ordered multiple lab work and communicated with nursing staff to get this collected to check post HD potassium. 1 g calcium  gluconate given.  Nephrology consulted and following.  Chronic Problems: Restart home meds once med reconciliation is completed.  HTN: continue home meds HLD: continue home meds CHF: Continue home meds T2DM: On Ozempic, start on SSI and titrate GERD: continue home PPI MDD/GAD: continue home meds  VTE prophylaxis: Eliquis  Diet: Renal diet Code Status:  Full Code Telemetry:  Admission status: Observation, Telemetry bed Patient is from: Home Anticipated d/c is to: Home Anticipated d/c is in: 1-2 days   Family Communication: Updated at bedside  Consults called: Nephrology   Severity of Illness: The appropriate patient status for this patient is OBSERVATION. Observation status is judged to be reasonable and necessary in order to provide the required intensity of service to ensure the patient's safety. The patient's presenting symptoms, physical exam findings, and initial radiographic and laboratory data in the context of their medical condition is felt to place them at decreased risk for further clinical deterioration. Furthermore, it is anticipated that the patient will be medically stable for discharge from the hospital within 2 midnights of admission.    Morene Bathe, MD Jolynn DEL. [facility]     [1]  Allergies Allergen Reactions   Sulfamethoxazole-Trimethoprim     Other reaction(s): Kidney Disorder Other reaction(s): Kidney Disorder Hyperkalemia, AKI Hyperkalemia, AKI    "

## 2024-05-28 NOTE — Progress Notes (Signed)
 I-STAT Potassium : 6.5. Lab called & drawing K+ level now with butterfly. Pt. Tolerating IV stick well.

## 2024-05-28 NOTE — Op Note (Signed)
" °  OPERATIVE NOTE   PROCEDURE: Ultrasound guidance for vascular access right femoral vein Placement of a 30 cm triple lumen dialysis catheter right femoral vein  PRE-OPERATIVE DIAGNOSIS: 1. Renal failure, hyperkalemia  POST-OPERATIVE DIAGNOSIS: Same  SURGEON: Selinda Gu, MD  ASSISTANT(S): None  ANESTHESIA: local  ESTIMATED BLOOD LOSS: Minimal   FINDING(S): 1.  None  SPECIMEN(S):  None  INDICATIONS:    Patient is a 74 y.o.female who presents with known end-stage renal failure and a failed access.  We were originally planning a PermCath but her potassium was 7 prohibiting sedation and a PermCath placement today so temporary catheter will be placed.  Risks and benefits were discussed, and informed consent was obtained..  DESCRIPTION: After obtaining full informed written consent, the patient was laid flat in the bed.  The right groin was sterilely prepped and draped in a sterile surgical field was created. The right femoral vein was visualized with ultrasound and found to be widely patent. It was then accessed under direct guidance without difficulty with a Seldinger needle and a permanent image was recorded. A J-wire was then placed. After skin nick and dilatation, a 30 cm triple-lumen dialysis catheter was placed over the wire and the wire was removed. The lumens withdrew dark red nonpulsatile blood and flushed easily with sterile saline. The catheter was secured to the skin with 3 nylon sutures. Sterile dressing was placed.  COMPLICATIONS: None  CONDITION: Stable  Selinda Gu 05/28/2024 3:05 PM  This note was created with Dragon Medical transcription system. Any errors in dictation are purely unintentional. "

## 2024-05-28 NOTE — Procedures (Signed)
 Post Hemodialysis Report:   Treatment Date:05/28/24  Treatment Length:2 hours  Patient Access: Right Femoral triple lumen temporary catheter  Tolerated urgent treatment well for elevated potassium, 1k bath utilized for treatment. Access functioned well with some arterial pressure alarms. No medications given with treatment. Net removal=1 liter, b/p was soft in 90s at start of treatment.  Post Vitals:  Temp:97.6 Temp Source: oral Heart Rate: 75 Resp: 18 B/P: 116/64(75) O2: 99% Room air

## 2024-05-28 NOTE — Progress Notes (Signed)
 Hypoglycemic Event  CBG: 43  Treatment: 8 oz juice/soda  Symptoms: None  Follow-up CBG: Time:2150 CBG Result:98  Possible Reasons for Event: Inadequate meal intake  Comments/MD notified:[place],NP    Jody Nelson

## 2024-05-29 ENCOUNTER — Encounter
Admission: AD | Disposition: A | Discharge status: Nursing Home (Includes ICF) | Payer: Self-pay | Source: Skilled Nursing Facility | Attending: Internal Medicine

## 2024-05-29 ENCOUNTER — Encounter: Payer: Self-pay | Admitting: Vascular Surgery

## 2024-05-29 DIAGNOSIS — E1122 Type 2 diabetes mellitus with diabetic chronic kidney disease: Secondary | ICD-10-CM | POA: Diagnosis present

## 2024-05-29 DIAGNOSIS — Z833 Family history of diabetes mellitus: Secondary | ICD-10-CM | POA: Diagnosis not present

## 2024-05-29 DIAGNOSIS — G8929 Other chronic pain: Secondary | ICD-10-CM | POA: Diagnosis present

## 2024-05-29 DIAGNOSIS — N2581 Secondary hyperparathyroidism of renal origin: Secondary | ICD-10-CM | POA: Diagnosis present

## 2024-05-29 DIAGNOSIS — Z6841 Body Mass Index (BMI) 40.0 and over, adult: Secondary | ICD-10-CM | POA: Diagnosis not present

## 2024-05-29 DIAGNOSIS — D6851 Activated protein C resistance: Secondary | ICD-10-CM | POA: Diagnosis present

## 2024-05-29 DIAGNOSIS — Z992 Dependence on renal dialysis: Secondary | ICD-10-CM | POA: Diagnosis not present

## 2024-05-29 DIAGNOSIS — E66813 Obesity, class 3: Secondary | ICD-10-CM | POA: Diagnosis present

## 2024-05-29 DIAGNOSIS — E785 Hyperlipidemia, unspecified: Secondary | ICD-10-CM | POA: Diagnosis present

## 2024-05-29 DIAGNOSIS — Y832 Surgical operation with anastomosis, bypass or graft as the cause of abnormal reaction of the patient, or of later complication, without mention of misadventure at the time of the procedure: Secondary | ICD-10-CM | POA: Diagnosis present

## 2024-05-29 DIAGNOSIS — T82898A Other specified complication of vascular prosthetic devices, implants and grafts, initial encounter: Secondary | ICD-10-CM | POA: Diagnosis not present

## 2024-05-29 DIAGNOSIS — D631 Anemia in chronic kidney disease: Secondary | ICD-10-CM | POA: Diagnosis present

## 2024-05-29 DIAGNOSIS — I5032 Chronic diastolic (congestive) heart failure: Secondary | ICD-10-CM | POA: Diagnosis present

## 2024-05-29 DIAGNOSIS — I132 Hypertensive heart and chronic kidney disease with heart failure and with stage 5 chronic kidney disease, or end stage renal disease: Secondary | ICD-10-CM | POA: Diagnosis present

## 2024-05-29 DIAGNOSIS — E1159 Type 2 diabetes mellitus with other circulatory complications: Secondary | ICD-10-CM | POA: Diagnosis present

## 2024-05-29 DIAGNOSIS — N186 End stage renal disease: Secondary | ICD-10-CM | POA: Diagnosis present

## 2024-05-29 DIAGNOSIS — K219 Gastro-esophageal reflux disease without esophagitis: Secondary | ICD-10-CM | POA: Diagnosis present

## 2024-05-29 DIAGNOSIS — T8241XA Breakdown (mechanical) of vascular dialysis catheter, initial encounter: Secondary | ICD-10-CM | POA: Diagnosis present

## 2024-05-29 DIAGNOSIS — I152 Hypertension secondary to endocrine disorders: Secondary | ICD-10-CM | POA: Diagnosis present

## 2024-05-29 DIAGNOSIS — Y712 Prosthetic and other implants, materials and accessory cardiovascular devices associated with adverse incidents: Secondary | ICD-10-CM | POA: Diagnosis present

## 2024-05-29 DIAGNOSIS — Z7901 Long term (current) use of anticoagulants: Secondary | ICD-10-CM | POA: Diagnosis not present

## 2024-05-29 DIAGNOSIS — Z7985 Long-term (current) use of injectable non-insulin antidiabetic drugs: Secondary | ICD-10-CM | POA: Diagnosis not present

## 2024-05-29 DIAGNOSIS — F329 Major depressive disorder, single episode, unspecified: Secondary | ICD-10-CM | POA: Diagnosis present

## 2024-05-29 DIAGNOSIS — Z794 Long term (current) use of insulin: Secondary | ICD-10-CM | POA: Diagnosis not present

## 2024-05-29 DIAGNOSIS — E875 Hyperkalemia: Secondary | ICD-10-CM | POA: Diagnosis present

## 2024-05-29 DIAGNOSIS — T82818A Embolism of vascular prosthetic devices, implants and grafts, initial encounter: Secondary | ICD-10-CM | POA: Diagnosis present

## 2024-05-29 DIAGNOSIS — F411 Generalized anxiety disorder: Secondary | ICD-10-CM | POA: Diagnosis present

## 2024-05-29 HISTORY — PX: DIALYSIS/PERMA CATHETER INSERTION: CATH118288

## 2024-05-29 LAB — BASIC METABOLIC PANEL WITH GFR
Anion gap: 8 (ref 5–15)
BUN: 35 mg/dL — ABNORMAL HIGH (ref 8–23)
CO2: 28 mmol/L (ref 22–32)
Calcium: 9.1 mg/dL (ref 8.9–10.3)
Chloride: 98 mmol/L (ref 98–111)
Creatinine, Ser: 8.18 mg/dL — ABNORMAL HIGH (ref 0.44–1.00)
GFR, Estimated: 5 mL/min — ABNORMAL LOW
Glucose, Bld: 129 mg/dL — ABNORMAL HIGH (ref 70–99)
Potassium: 4.7 mmol/L (ref 3.5–5.1)
Sodium: 134 mmol/L — ABNORMAL LOW (ref 135–145)

## 2024-05-29 LAB — CBC
HCT: 26.9 % — ABNORMAL LOW (ref 36.0–46.0)
Hemoglobin: 8.7 g/dL — ABNORMAL LOW (ref 12.0–15.0)
MCH: 30.6 pg (ref 26.0–34.0)
MCHC: 32.3 g/dL (ref 30.0–36.0)
MCV: 94.7 fL (ref 80.0–100.0)
Platelets: 109 K/uL — ABNORMAL LOW (ref 150–400)
RBC: 2.84 MIL/uL — ABNORMAL LOW (ref 3.87–5.11)
RDW: 14.3 % (ref 11.5–15.5)
WBC: 4.1 K/uL (ref 4.0–10.5)
nRBC: 0 % (ref 0.0–0.2)

## 2024-05-29 LAB — RENAL FUNCTION PANEL
Albumin: 3.1 g/dL — ABNORMAL LOW (ref 3.5–5.0)
Anion gap: 9 (ref 5–15)
BUN: 33 mg/dL — ABNORMAL HIGH (ref 8–23)
CO2: 27 mmol/L (ref 22–32)
Calcium: 9.2 mg/dL (ref 8.9–10.3)
Chloride: 98 mmol/L (ref 98–111)
Creatinine, Ser: 7.93 mg/dL — ABNORMAL HIGH (ref 0.44–1.00)
GFR, Estimated: 5 mL/min — ABNORMAL LOW
Glucose, Bld: 163 mg/dL — ABNORMAL HIGH (ref 70–99)
Phosphorus: 4 mg/dL (ref 2.5–4.6)
Potassium: 4.8 mmol/L (ref 3.5–5.1)
Sodium: 134 mmol/L — ABNORMAL LOW (ref 135–145)

## 2024-05-29 LAB — GLUCOSE, CAPILLARY
Glucose-Capillary: 90 mg/dL (ref 70–99)
Glucose-Capillary: 163 mg/dL — ABNORMAL HIGH (ref 70–99)
Glucose-Capillary: 104 mg/dL — ABNORMAL HIGH (ref 70–99)
Glucose-Capillary: 104 mg/dL — ABNORMAL HIGH (ref 70–99)
Glucose-Capillary: 90 mg/dL (ref 70–99)

## 2024-05-29 LAB — HEMOGLOBIN A1C
Hgb A1c MFr Bld: 6 % — ABNORMAL HIGH (ref 4.8–5.6)
Mean Plasma Glucose: 125.5 mg/dL

## 2024-05-29 MED ORDER — CEFAZOLIN SODIUM-DEXTROSE 1-4 GM/50ML-% IV SOLN
INTRAVENOUS | Status: AC
  Filled 2024-05-29: qty 50

## 2024-05-29 MED ORDER — CEFAZOLIN SODIUM-DEXTROSE 1-4 GM/50ML-% IV SOLN
1.0000 g | INTRAVENOUS | Status: AC
  Administered 2024-05-29: 1 g via INTRAVENOUS

## 2024-05-29 MED ORDER — MIDAZOLAM HCL (PF) 2 MG/2ML IJ SOLN
INTRAMUSCULAR | Status: DC | PRN
  Administered 2024-05-29: 2 mg via INTRAVENOUS

## 2024-05-29 MED ORDER — MIDAZOLAM HCL 2 MG/2ML IJ SOLN
INTRAMUSCULAR | Status: AC
  Filled 2024-05-29: qty 2

## 2024-05-29 MED ORDER — CALCIUM GLUCONATE-NACL 1-0.675 GM/50ML-% IV SOLN
1.0000 g | Freq: Once | INTRAVENOUS | Status: AC
  Administered 2024-05-29: 1000 mg via INTRAVENOUS
  Filled 2024-05-29: qty 50

## 2024-05-29 MED ORDER — SODIUM CHLORIDE 0.9 % IV SOLN: INTRAVENOUS | Status: DC

## 2024-05-29 MED ORDER — OXYCODONE HCL 5 MG PO TABS
5.0000 mg | ORAL_TABLET | Freq: Once | ORAL | Status: AC
  Administered 2024-05-29: 5 mg via ORAL
  Filled 2024-05-29: qty 1

## 2024-05-29 MED ORDER — HEPARIN (PORCINE) IN NACL 1000-0.9 UT/500ML-% IV SOLN
INTRAVENOUS | Status: DC | PRN
  Administered 2024-05-29: 500 mL

## 2024-05-29 MED ORDER — HEPARIN SODIUM (PORCINE) 10000 UNIT/ML IJ SOLN
INTRAMUSCULAR | Status: AC
  Filled 2024-05-29: qty 1

## 2024-05-29 MED ORDER — LIDOCAINE-EPINEPHRINE (PF) 1 %-1:200000 IJ SOLN
INTRAMUSCULAR | Status: DC | PRN
  Administered 2024-05-29: 20 mL

## 2024-05-29 MED ORDER — FENTANYL CITRATE (PF) 100 MCG/2ML IJ SOLN
INTRAMUSCULAR | Status: AC
  Filled 2024-05-29: qty 2

## 2024-05-29 MED ORDER — HEPARIN SODIUM (PORCINE) 10000 UNIT/ML IJ SOLN
INTRAMUSCULAR | Status: DC | PRN
  Administered 2024-05-29: 10000 [IU]

## 2024-05-29 NOTE — Progress Notes (Signed)
 " PROGRESS NOTE    Jody Nelson  FMW:[id number] DOB: 1951-04-07 DOA: 05/28/2024 PCP: [place]      Brief Narrative:   Jody Nelson is a 74 y.o. year old female with medical history of hypertension, hyperlipidemia, type 2 diabetes, ESRD on HD MWF, HFpEF presenting from skilled nursing facility after missing HD due to problem with access.  States her fistula had clotted and she was unable to get dialysis on Friday.  She states he is not having any symptoms.  Denies any URI symptoms.  Denies any muscle aches or cramps.  Without any palpitations.  Seen in HD as patient was noted to have severe hyperkalemia on arrival and PermCath was placed.  Nephrology consulted after PermCath placement and initiated emergent dialysis.  Given need for admission, [facility] contacted for admission.    Assessment & Plan:   Principal Problem:   ESRD needing dialysis (HCC) Active Problems:   Acute hyperkalemia   (HFpEF) heart failure with preserved ejection fraction (HCC)   Type II diabetes mellitus (HCC)   GERD (gastroesophageal reflux disease)   Hyperlipidemia with target LDL less than 70   Essential hypertension   Hypertension associated with type 2 diabetes mellitus (HCC)   Sarcoidosis   Vitamin D deficiency   Morbid (severe) obesity due to excess calories (HCC)  # Hyperkalemia 2/2 missed dialysis, resolved with dialysis - monitor  # ESRD On mwf hemodialysis outpt, clotted fistula, missed dialysis, presented hyperkalemic as above. Urgent temporary dialysis catheter placed in right groin on 1/19 and received dialysis - vascular following, needs permacath  # Debility [facility]  # Home meds Med rec pending, will need to review meds when that is complete  # Factor 5 leiden thrombophilia - continue home apixaban   # History CVA - home apixaban , statin  # HFpEF Euvolemic - volume w/ dialysis and home torsemide  prn - cont home coreg   # HTN Normotensive - cont  home coreg   # Chronic pain - med rec pending - con thome pregabalin   # T2DM Appears to be diet controlled - monitor daily fastings  # Obesity  noted   DVT prophylaxis: n/a (therapeutic anticoagulation) Code Status: full Family Communication: daughter telephonically 1/20  Level of care: Telemetry Status is: Observation   Consultants:  Nephrology Vascular surgery  Procedures: Temp dialysis catheter right groin 1/19  Antimicrobials:  none    Subjective: Reports feeling fine, no pain, no complaints  Objective: Vitals:   05/28/24 1921 05/28/24 2055 05/29/24 0332 05/29/24 0814  BP: 116/64 116/67 121/68 133/60  Pulse: 81 84  83  Resp: 13 16 16 16   Temp:  97.7 F (36.5 C) 97.8 F (36.6 C) 97.7 F (36.5 C)  TempSrc:  Oral  Axillary  SpO2: 99% 100% 100% 99%  Weight: 127.3 kg  127.3 kg   Height:        Intake/Output Summary (Last 24 hours) at 05/29/2024 1059 Last data filed at 05/29/2024 0900 Gross per 24 hour  Intake 350 ml  Output 1000 ml  Net -650 ml   Filed Weights   05/28/24 1616 05/28/24 1921 05/29/24 0332  Weight: 128.3 kg 127.3 kg 127.3 kg    Examination:  General exam: Appears calm and comfortable  Respiratory system: Clear to auscultation. Respiratory effort normal. Cardiovascular system: S1 & S2 heard, RRR.   Gastrointestinal system: Abdomen is nondistended, soft and obese Central nervous system: Alert and oriented. No focal neurological deficits. Extremities: Symmetric 5 x 5 power. Trace LE  edema Skin: No visible rashes, lesions or ulcers Psychiatry: Judgement and insight appear normal. Mood & affect appropriate.     Data Reviewed: I have personally reviewed following labs and imaging studies  CBC: Recent Labs  Lab 05/29/24 0434  WBC 4.1  HGB 8.7*  HCT 26.9*  MCV 94.7  PLT 109*   Basic Metabolic Panel: Recent Labs  Lab 05/28/24 1405 05/29/24 0018 05/29/24 0434  NA  --  134* 134*  K 7.0* 4.8 4.7  CL  --  98 98  CO2  --   27 28  GLUCOSE  --  163* 129*  BUN  --  33* 35*  CREATININE  --  7.93* 8.18*  CALCIUM   --  9.2 9.1  MG 2.8*  --   --   PHOS  --  4.0  --    GFR: Estimated Creatinine Clearance: 8.3 mL/min (A) (by C-G formula based on SCr of 8.18 mg/dL (H)). Liver Function Tests: Recent Labs  Lab 05/29/24 0018  ALBUMIN  3.1*   No results for input(s): LIPASE, AMYLASE in the last 168 hours. No results for input(s): AMMONIA in the last 168 hours. Coagulation Profile: No results for input(s): INR, PROTIME in the last 168 hours. Cardiac Enzymes: No results for input(s): CKTOTAL, CKMB, CKMBINDEX, TROPONINI in the last 168 hours. BNP (last 3 results) No results for input(s): PROBNP in the last 8760 hours. HbA1C: Recent Labs    05/28/24 2235  HGBA1C 6.0*   CBG: Recent Labs  Lab 05/28/24 1335 05/28/24 2054 05/28/24 2150 05/29/24 0855  GLUCAP 95 43* 98 90   Lipid Profile: No results for input(s): CHOL, HDL, LDLCALC, TRIG, CHOLHDL, LDLDIRECT in the last 72 hours. Thyroid  Function Tests: No results for input(s): TSH, T4TOTAL, FREET4, T3FREE, THYROIDAB in the last 72 hours. Anemia Panel: No results for input(s): VITAMINB12, FOLATE, FERRITIN, TIBC, IRON , RETICCTPCT in the last 72 hours. Urine analysis: No results found for: COLORURINE, APPEARANCEUR, LABSPEC, PHURINE, GLUCOSEU, HGBUR, BILIRUBINUR, KETONESUR, PROTEINUR, UROBILINOGEN, NITRITE, LEUKOCYTESUR Sepsis Labs: @LABRCNTIP (procalcitonin:4,lacticidven:4)  )No results found for this or any previous visit (from the past 240 hours).       Radiology Studies: PERIPHERAL VASCULAR CATHETERIZATION Result Date: 05/28/2024 See surgical note for result.       Scheduled Meds:  apixaban   5 mg Oral BID   atorvastatin   80 mg Oral Daily   calcitRIOL   0.25 mcg Oral Daily   [START ON 05/30/2024] calcium  acetate  667 mg Oral 2 times per day on Monday Wednesday Friday    And   calcium  acetate  667 mg Oral 3 times per day on Sunday Tuesday Thursday Saturday   calcium -vitamin D  1 tablet Oral Q T,Th,S,Su   carvedilol   12.5 mg Oral BID   Chlorhexidine  Gluconate Cloth  6 each Topical Q0600   ferrous sulfate   325 mg Oral Daily   folic acid   1 mg Oral Daily   insulin  aspart  0-9 Units Subcutaneous TID WC   latanoprost   1 drop Both Eyes QHS   montelukast   10 mg Oral QHS   multivitamin  1 tablet Oral BID   pantoprazole   40 mg Oral Daily   pregabalin   25 mg Oral Daily   sodium chloride  flush  3 mL Intravenous Q12H   Continuous Infusions:   LOS: 0 days     Devaughn KATHEE Ban, MD [facility]   If 7PM-7AM, please contact night-coverage [email or web address] Password [email or web address] 05/29/2024, 10:59 AM     "

## 2024-05-29 NOTE — Progress Notes (Signed)
 " [facility]  ROUNDING NOTE   Subjective:   Jody Nelson is a 74 y.o. female with heart failure with preserved ejection fraction, end-stage renal disease, degenerative joint disease, depression, hypertension, sleep apnea, coronary disease, obesity, GERD, diabetes type 2, and history of stroke. Patient presents to vascular surgery for scheduled permcath and was found to have elevated potassium. Patient has been admitted under observation for End stage renal disease (HCC) [N18.6] ESRD needing dialysis (HCC) [N18.6, Z99.2]  atient is known to our practice and receives outpatient dialysis treatments at [facility] on a MWF schedule, supervised by Dr. Dennise. Last treatment received on Friday. Patient found to have potassium 7.0 preprocedure. No signs of respiratory distress.   Patient received urgent dialysis yesterday evening, potassium 4.7 today   Objective:  Vital signs in last 24 hours:  Temp:  [97.6 F (36.4 C)-97.9 F (36.6 C)] 97.7 F (36.5 C) (01/20 0814) Pulse Rate:  [65-85] 83 (01/20 0814) Resp:  [10-20] 16 (01/20 0814) BP: (96-152)/(44-74) 133/60 (01/20 0814) SpO2:  [94 %-100 %] 99 % (01/20 0814) Weight:  [127.3 kg-128.4 kg] 127.3 kg (01/20 0332)  Weight change:  Filed Weights   05/28/24 1616 05/28/24 1921 05/29/24 0332  Weight: 128.3 kg 127.3 kg 127.3 kg    Intake/Output: I/O last 3 completed shifts: In: 230 [P.O.:180; IV Piggyback:50] Out: 1000 [Other:1000]   Intake/Output this shift:  Total I/O In: 120 [P.O.:120] Out: -   Physical Exam: General: NAD  Head: Normocephalic, atraumatic. Moist oral mucosal membranes  Eyes: Anicteric  Lungs:  Clear to auscultation, normal effort  Heart: Regular rate and rhythm  Abdomen:  Soft, nontender  Extremities:  1+ peripheral edema.  Neurologic: Awake, alert, conversant  Skin: Warm,dry, no rash  Access: Rt femoral HD temp cath    Basic Metabolic Panel: Recent Labs  Lab 05/28/24 1405  05/29/24 0018 05/29/24 0434  NA  --  134* 134*  K 7.0* 4.8 4.7  CL  --  98 98  CO2  --  27 28  GLUCOSE  --  163* 129*  BUN  --  33* 35*  CREATININE  --  7.93* 8.18*  CALCIUM   --  9.2 9.1  MG 2.8*  --   --   PHOS  --  4.0  --     Liver Function Tests: Recent Labs  Lab 05/29/24 0018  ALBUMIN  3.1*   No results for input(s): LIPASE, AMYLASE in the last 168 hours. No results for input(s): AMMONIA in the last 168 hours.  CBC: Recent Labs  Lab 05/29/24 0434  WBC 4.1  HGB 8.7*  HCT 26.9*  MCV 94.7  PLT 109*    Cardiac Enzymes: No results for input(s): CKTOTAL, CKMB, CKMBINDEX, TROPONINI in the last 168 hours.  BNP: Invalid input(s): POCBNP  CBG: Recent Labs  Lab 05/28/24 1335 05/28/24 2054 05/28/24 2150 05/29/24 0855  GLUCAP 95 43* 98 90    Microbiology: Results for orders placed or performed during the hospital encounter of 01/30/24  MRSA Next Gen by PCR, Nasal     Status: None   Collection Time: 01/30/24  6:25 PM   Specimen: Nasal Mucosa; Nasal Swab  Result Value Ref Range Status   MRSA by PCR Next Gen NOT DETECTED NOT DETECTED Final    Comment: (NOTE) The GeneXpert MRSA Assay (FDA approved for NASAL specimens only), is one component of a comprehensive MRSA colonization surveillance program. It is not intended to diagnose MRSA infection nor to guide or monitor treatment  for MRSA infections. Test performance is not FDA approved in patients less than 45 years old. Performed at [facility], [address] [place], [place] [address]     Coagulation Studies: No results for input(s): LABPROT, INR in the last 72 hours.  Urinalysis: No results for input(s): COLORURINE, LABSPEC, PHURINE, GLUCOSEU, HGBUR, BILIRUBINUR, KETONESUR, PROTEINUR, UROBILINOGEN, NITRITE, LEUKOCYTESUR in the last 72 hours.  Invalid input(s): APPERANCEUR    Imaging: PERIPHERAL VASCULAR CATHETERIZATION Result Date:  05/28/2024 See surgical note for result.    Medications:     apixaban   5 mg Oral BID   atorvastatin   80 mg Oral Daily   calcitRIOL   0.25 mcg Oral Daily   [START ON 05/30/2024] calcium  acetate  667 mg Oral 2 times per day on Monday Wednesday Friday   And   calcium  acetate  667 mg Oral 3 times per day on Sunday Tuesday Thursday Saturday   calcium -vitamin D  1 tablet Oral Q T,Th,S,Su   carvedilol   12.5 mg Oral BID   Chlorhexidine  Gluconate Cloth  6 each Topical Q0600   ferrous sulfate   325 mg Oral Daily   folic acid   1 mg Oral Daily   insulin  aspart  0-9 Units Subcutaneous TID WC   latanoprost   1 drop Both Eyes QHS   montelukast   10 mg Oral QHS   multivitamin  1 tablet Oral BID   pantoprazole   40 mg Oral Daily   pregabalin   25 mg Oral Daily   sodium chloride  flush  3 mL Intravenous Q12H   acetaminophen  **OR** acetaminophen , HYDROmorphone  (DILAUDID ) injection, ondansetron  (ZOFRAN ) IV, senna-docusate, torsemide   Assessment/ Plan:  [name] is a 74 y.o.  female heart failure with preserved ejection fraction, end-stage renal disease, degenerative joint disease, depression, hypertension, sleep apnea, coronary disease, obesity, GERD, diabetes type 2, and history of stroke. Patient presents to vascular surgery for scheduled permcath and was found to have elevated potassium. Patient has been admitted under observation for End stage renal disease (HCC) [N18.6] ESRD needing dialysis (HCC) [N18.6, Z99.2]   Hyperkalemia with end stage renal disease. Last treatment received on Friday. Potassium 7.0. Received dialysis yesterday on 1K bath. Potassium 4.7 today. Removed 1l of fluid. Next treatment scheduled for Wednesday.   2. Malfunctioning dialysis access. Scheduled for permcath with vascular surgery however procedure deferred due to electrolyte imbalance.   3. Anemia of chronic kidney disease Lab Results  Component Value Date   HGB 8.7 (L) 05/29/2024    Hgb slightly decreased. Patient  receives Mircera at outpatient clinic.   4. Secondary Hyperparathyroidism: with outpatient labs: PTH 69, phosphorus 5.1, calcium  9.4 on 05/21/24.   Lab Results  Component Value Date   CALCIUM  9.1 05/29/2024   CAION 1.13 (L) 04/16/2022   PHOS 4.0 05/29/2024    Will continue to monitor bone minerals during this admission.    LOS: 0 Jody Nelson 1/20/202611:17 AM   "

## 2024-05-29 NOTE — Plan of Care (Signed)

## 2024-05-29 NOTE — TOC Initial Note (Signed)
 Transition of Care [facility]) - Initial/Assessment Note    Patient Details  Name: Jody Nelson MRN: [id number] Date of Birth: [birth year]  [facility] CM/SW Contact:    Corean ONEIDA Haddock, RN Phone Number: 05/29/2024, 3:27 PM  Clinical Narrative:                  Confirmed with Adrien at [facility] that patient is LTC.  PT eval pending.  Plan for perm cath placement        Patient Goals and CMS Choice            Expected Discharge Plan and Services                                              Prior Living Arrangements/Services                       Activities of Daily Living   ADL Screening (condition at time of admission) Independently performs ADLs?: No Does the patient have a NEW difficulty with bathing/dressing/toileting/self-feeding that is expected to last >3 days?: No Does the patient have a NEW difficulty with getting in/out of bed, walking, or climbing stairs that is expected to last >3 days?: No Does the patient have a NEW difficulty with communication that is expected to last >3 days?: No Is the patient deaf or have difficulty hearing?: No Does the patient have difficulty seeing, even when wearing glasses/contacts?: No Does the patient have difficulty concentrating, remembering, or making decisions?: No  Permission Sought/Granted                  Emotional Assessment              Admission diagnosis:  End stage renal disease (HCC) [N18.6] ESRD needing dialysis (HCC) [N18.6, Z99.2] Patient Active Problem List   Diagnosis Date Noted   ESRD needing dialysis (HCC) 05/28/2024   Acute hyperkalemia 01/30/2024   Thrombosis of renal dialysis arteriovenous graft 01/30/2024   Complication of vascular access for dialysis 09/20/2023   Obesity (BMI 30-39.9) 09/20/2023   Hyponatremia 09/20/2023   Thrombocytopenia 09/20/2023   Complication of vascular access for dialysis, sequela 09/20/2023   Hyperkalemia 09/19/2023    Arteriovenous fistula occlusion 09/19/2023   ESRD (end stage renal disease) (HCC) 03/13/2023   Dyslipidemia 03/13/2023   Type 2 diabetes mellitus with peripheral neuropathy (HCC) 03/13/2023   Abscess of left upper extremity 03/12/2023   Slurred speech 07/23/2022   Acute CVA (cerebrovascular accident) (HCC) 07/22/2022   Multiple open wounds of lower leg 06/17/2022   Wound infection 06/14/2022   Decubitus ulcer of heel, bilateral 06/13/2022   Hypercoagulable state (positive anticardiolipin, elevated homocysteine, factor V Leiden deficiency) 06/13/2022   Osteomyelitis of right leg (HCC) 06/12/2022   Non-healing wound of right lower extremity 06/12/2022   Cellulitis and abscess of right leg 06/12/2022   Chronic anticoagulation 06/12/2022   ESRD on hemodialysis (HCC) 12/27/2021   (HFpEF) heart failure with preserved ejection fraction (HCC) 01/31/2021   Activated protein C resistance 10/02/2020   Hereditary and idiopathic neuropathy, unspecified 10/02/2020   History of falling 10/02/2020   Insomnia, unspecified 10/02/2020   Muscle weakness (generalized) 10/02/2020   Resistance to multiple antibiotics 10/02/2020   Unspecified abnormalities of gait and mobility 10/02/2020   Unspecified lack of coordination 10/02/2020   Left-sided weakness 10/02/2020  Anemia of chronic renal failure 10/02/2020   Multiple drug resistant organism (MDRO) culture positive 09/25/2020   History of stroke 06/11/2020   Uncontrolled type 2 diabetes mellitus with hyperglycemia, with long-term current use of insulin  (HCC) 06/11/2020   Urinary tract infection 06/11/2020   Heel ulcer, left, limited to breakdown of skin (HCC) 01/04/2020   Leg ulcer, left, limited to breakdown of skin (HCC) 01/04/2020   Lymphedema, not elsewhere classified 01/04/2020   Chest pain, atypical 09/10/2019   Anemia of chronic disease 09/19/2018   Cellulitis of left lower extremity 09/19/2018   Sepsis (HCC) 09/19/2018   Anticardiolipin  antibody positive 08/16/2018   Elevated homocysteine 08/16/2018   Heterozygous factor V Leiden mutation 08/16/2018   History of obstructive sleep apnea 07/18/2018   Ischemic stroke (HCC) 07/18/2018   Acute kidney failure, unspecified 07/02/2018   Acute kidney injury superimposed on CKD 06/29/2018   Encounter for other specified surgical aftercare 05/11/2018   Proteus (mirabilis) (morganii) as the cause of diseases classified elsewhere 05/11/2018   CKD (chronic kidney disease) stage 3, GFR 30-59 ml/min (HCC) 03/22/2018   Closed fracture of proximal end of left humerus 03/22/2018   Closed fracture of right tibial plateau with routine healing 03/22/2018   Hyperlipidemia with target LDL less than 70 07/14/2017   Morbid (severe) obesity due to excess calories (HCC) 10/20/2016   Unspecified sequelae of cerebral infarction 10/20/2016   Unspecified diastolic (congestive) heart failure (HCC) 10/20/2016   Type 2 diabetes mellitus with hyperlipidemia (HCC) 10/20/2016   Primary osteoarthritis of both knees 05/21/2016   History of influenza 05/04/2016   Dyspnea on effort 03/12/2016   GERD (gastroesophageal reflux disease) 01/27/2016   Calculus of gallbladder without cholecystitis without obstruction 10/21/2015   Pseudophakia of left eye 10/07/2015   Pseudophakia of right eye 08/26/2015   Morbid obesity with BMI of 50.0-59.9, adult (HCC) 07/29/2015   Lymphedema of both lower extremities 07/08/2015   Hallux valgus, acquired 07/08/2015   Onychomycosis due to dermatophyte 07/08/2015   At high risk for falls 12/10/2014   Essential hypertension 12/10/2014   Hypertension associated with type 2 diabetes mellitus (HCC) 12/10/2014   Vitamin D deficiency 11/21/2014   Type II diabetes mellitus (HCC) 10/13/2012   Sarcoidosis 01/31/2012   PCP:  [place]:   [facility] - [place], [place] - [address] [place] [address] Phone:  [phone] Fax: [phone]     Social Drivers of Health (SDOH) Social History: SDOH Screenings   Food Insecurity: No Food Insecurity (05/28/2024)  Housing: Low Risk (05/28/2024)  Transportation Needs: No Transportation Needs (05/28/2024)  Utilities: Not At Risk (05/28/2024)  Financial Resource Strain: Low Risk  (07/07/2023)   Received from [facility]  Social Connections: Moderately Isolated (05/28/2024)  Tobacco Use: Medium Risk (04/19/2024)   SDOH Interventions:     Readmission Risk Interventions     No data to display

## 2024-05-29 NOTE — Interval H&P Note (Signed)
 History and Physical Interval Note:  05/29/2024 3:53 PM  Jody Nelson  has presented today for surgery, with the diagnosis of End stage renal disease.  The various methods of treatment have been discussed with the patient and family. After consideration of risks, benefits and other options for treatment, the patient has consented to  Procedures: DIALYSIS/PERMA CATHETER INSERTION (N/A) as a surgical intervention.  The patient's history has been reviewed, patient examined, no change in status, stable for surgery.  I have reviewed the patient's chart and labs.  Questions were answered to the patient's satisfaction.     Verble Styron

## 2024-05-29 NOTE — Progress Notes (Signed)
 PT Cancellation Note  Patient Details Name: Abagael Kramm MRN: [id number] DOB: March 20, 1951   Cancelled Treatment:    Reason Eval/Treat Not Completed: Other (comment): Pt with R fem temp cath.  Per Dr. Kandis PT to be held at this time with perm cath placement possibly taking place later this date.  Will attempt to see pt at a future date/time as medically appropriate.    DSABRA Glendia Bertin PT, DPT 05/29/24, 1:40 PM

## 2024-05-29 NOTE — Op Note (Signed)
 OPERATIVE NOTE    PRE-OPERATIVE DIAGNOSIS: 1. ESRD   POST-OPERATIVE DIAGNOSIS: same as above  PROCEDURE: Ultrasound guidance for vascular access to the left internal jugular vein Fluoroscopic guidance for placement of catheter Placement of a 23 cm tip to cuff tunneled hemodialysis catheter via the left internal jugular vein  SURGEON: Selinda Gu, MD  ANESTHESIA:  Local with Moderate conscious sedation for approximately 36 minutes using 2 mg of Versed    ESTIMATED BLOOD LOSS: 5 cc  FLUORO TIME: less than one minute  CONTRAST: none  FINDING(S): 1.  Patent left internal jugular vein  SPECIMEN(S):  None  INDICATIONS:   Jody Nelson is a 74 y.o. female who presents with renal failure and a failed permanent access.  The patient needs long term dialysis access for their ESRD, and a Permcath is necessary.  Risks and benefits are discussed and informed consent is obtained.    DESCRIPTION: After obtaining full informed written consent, the patient was brought back to the vascular suited. The patient's left neck and chest were sterilely prepped and draped in a sterile surgical field was created. Moderate conscious sedation was administered during a face to face encounter with the patient throughout the procedure with my supervision of the RN administering medicines and monitoring the patient's vital signs, pulse oximetry, telemetry and mental status throughout from the start of the procedure until the patient was taken to the recovery room.  The left internal jugular vein was visualized with ultrasound and found to be patent. It was then accessed under direct ultrasound guidance and a permanent image was recorded. A wire was placed. After skin nick and dilatation, the peel-away sheath was placed over the wire. I then turned my attention to an area under the clavicle. Approximately 1-2 fingerbreadths below the clavicle a small counterincision was created and tunneled from the subclavicular incision  to the access site. Using fluoroscopic guidance, a 23 centimeter tip to cuff tunneled hemodialysis catheter was selected, and tunneled from the subclavicular incision to the access site. It was then placed through the peel-away sheath and the peel-away sheath was removed. Using fluoroscopic guidance the catheter tips were parked in the right atrium. The appropriate distal connectors were placed. It withdrew blood well and flushed easily with heparinized saline and a concentrated heparin  solution was then placed. It was secured to the chest wall with 2 Prolene sutures. The access incision was closed single 4-0 Monocryl. A 4-0 Monocryl pursestring suture was placed around the exit site. Sterile dressings were placed. The patient tolerated the procedure well and was taken to the recovery room in stable condition.  COMPLICATIONS: None  CONDITION: Stable  Selinda Gu  05/29/2024, 4:57 PM   This note was created with Dragon Medical transcription system. Any errors in dictation are purely unintentional.

## 2024-05-30 ENCOUNTER — Encounter: Payer: Self-pay | Admitting: Vascular Surgery

## 2024-05-30 DIAGNOSIS — N186 End stage renal disease: Secondary | ICD-10-CM | POA: Diagnosis not present

## 2024-05-30 DIAGNOSIS — Z992 Dependence on renal dialysis: Secondary | ICD-10-CM

## 2024-05-30 LAB — CBC
HCT: 31.2 % — ABNORMAL LOW (ref 36.0–46.0)
Hemoglobin: 9.7 g/dL — ABNORMAL LOW (ref 12.0–15.0)
MCH: 30.3 pg (ref 26.0–34.0)
MCHC: 31.1 g/dL (ref 30.0–36.0)
MCV: 97.5 fL (ref 80.0–100.0)
Platelets: 124 K/uL — ABNORMAL LOW (ref 150–400)
RBC: 3.2 MIL/uL — ABNORMAL LOW (ref 3.87–5.11)
RDW: 14.3 % (ref 11.5–15.5)
WBC: 4.9 K/uL (ref 4.0–10.5)
nRBC: 0 % (ref 0.0–0.2)

## 2024-05-30 LAB — POTASSIUM: Potassium: 4.9 mmol/L (ref 3.5–5.1)

## 2024-05-30 LAB — BASIC METABOLIC PANEL WITH GFR
Anion gap: 12 (ref 5–15)
BUN: 43 mg/dL — ABNORMAL HIGH (ref 8–23)
CO2: 23 mmol/L (ref 22–32)
Calcium: 9.6 mg/dL (ref 8.9–10.3)
Chloride: 98 mmol/L (ref 98–111)
Creatinine, Ser: 9.34 mg/dL — ABNORMAL HIGH (ref 0.44–1.00)
GFR, Estimated: 4 mL/min — ABNORMAL LOW
Glucose, Bld: 104 mg/dL — ABNORMAL HIGH (ref 70–99)
Potassium: >7.5 mmol/L (ref 3.5–5.1)
Sodium: 133 mmol/L — ABNORMAL LOW (ref 135–145)

## 2024-05-30 LAB — GLUCOSE, CAPILLARY
Glucose-Capillary: 96 mg/dL (ref 70–99)
Glucose-Capillary: 137 mg/dL — ABNORMAL HIGH (ref 70–99)
Glucose-Capillary: 114 mg/dL — ABNORMAL HIGH (ref 70–99)

## 2024-05-30 LAB — HEPATITIS B SURFACE ANTIBODY, QUANTITATIVE: Hep B S AB Quant (Post): <3.5 m[IU]/mL — ABNORMAL LOW

## 2024-05-30 MED ORDER — DICLOFENAC SODIUM 1 % EX GEL: 2.0000 g | CUTANEOUS | Status: DC | PRN

## 2024-05-30 MED ORDER — HYDROMORPHONE HCL 1 MG/ML IJ SOLN
1.0000 mg | Freq: Once | INTRAMUSCULAR | Status: AC
  Administered 2024-05-30: 1 mg via INTRAVENOUS
  Filled 2024-05-30: qty 1

## 2024-05-30 MED ORDER — TRAMADOL HCL 50 MG PO TABS
50.0000 mg | ORAL_TABLET | Freq: Once | ORAL | Status: AC
  Administered 2024-05-30: 50 mg via ORAL
  Filled 2024-05-30: qty 1

## 2024-05-30 MED ORDER — HYDROCODONE-ACETAMINOPHEN 5-325 MG PO TABS
1.0000 | ORAL_TABLET | Freq: Four times a day (QID) | ORAL | Status: DC | PRN
  Administered 2024-05-30 (×2): 1 via ORAL
  Filled 2024-05-30 (×2): qty 1

## 2024-05-30 MED ORDER — ALBUTEROL SULFATE (2.5 MG/3ML) 0.083% IN NEBU: 2.5000 mg | INHALATION_SOLUTION | Freq: Four times a day (QID) | RESPIRATORY_TRACT | Status: DC | PRN

## 2024-05-30 MED ORDER — LORATADINE 10 MG PO TABS
10.0000 mg | ORAL_TABLET | ORAL | Status: DC
  Administered 2024-05-30: 10 mg via ORAL
  Filled 2024-05-30 (×2): qty 1

## 2024-05-30 MED ORDER — LIDOCAINE 5 % EX PTCH
1.0000 | MEDICATED_PATCH | CUTANEOUS | Status: DC
  Administered 2024-05-30 – 2024-05-31 (×2): 1 via TRANSDERMAL
  Filled 2024-05-30 (×2): qty 1

## 2024-05-30 MED ORDER — PATIROMER SORBITEX CALCIUM 8.4 G PO PACK
16.8000 g | PACK | Freq: Every day | ORAL | Status: DC
  Administered 2024-05-31: 16.8 g via ORAL
  Filled 2024-05-30: qty 2

## 2024-05-30 MED ORDER — HEPARIN SODIUM (PORCINE) 1000 UNIT/ML IJ SOLN
INTRAMUSCULAR | Status: AC
  Filled 2024-05-30: qty 5

## 2024-05-30 NOTE — TOC CM/SW Note (Signed)
 Transition of Care [facility]) - Inpatient Brief Assessment   Patient Details  Name: Jody Nelson MRN: [id number] Date of Birth: 1951-02-04  [facility] CM/SW Contact:    Corean ONEIDA Haddock, RN Phone Number: 05/30/2024, 2:52 PM   Clinical Narrative: Patient admitted from [facility] LTC Patient confirms plan to return at discharge Fl2 sent for signature     Transition of Care Asessment: Insurance and Status: Insurance coverage has been reviewed       Prior/Current Home Services:  (LTC at [facility]) Social Drivers of Health Review: SDOH reviewed interventions complete Readmission risk has been reviewed: Yes Transition of care needs: no transition of care needs at this time

## 2024-05-30 NOTE — Progress Notes (Signed)
 PT Cancellation Note  Patient Details Name: Clifford Coudriet MRN: [id number] DOB: 12-16-1950   Cancelled Treatment:    Reason Eval/Treat Not Completed: Other (comment) Pt OTF for dialysis at this time. Will f/u as able.  Richerd Pinal, PT, DPT 05/30/24, 9:17 AM    Richerd CHRISTELLA Pinal 05/30/2024, 9:16 AM

## 2024-05-30 NOTE — Progress Notes (Signed)
 Hemodialysis Note:  Received patient in bed to unit. Alert and oriented. Informed consent singed and in chart.  Treatment initiated: 0800 Treatment completed: 1230  Access used: Left internal jugular catheter Access issues: None  Patient tolerated well. Transported back to room, alert without acute distress. Report given to patient's RN.  Total UF removed: 1.5 Liters Medications given: None  Post HD weight: 130.6 Kg  [facility]

## 2024-05-30 NOTE — NC FL2 (Signed)
 " [place]  MEDICAID FL2 LEVEL OF CARE FORM     IDENTIFICATION  Patient Name: Jody Nelson Birthdate: 1950-05-11 Sex: female Admission Date (Current Location): 05/28/2024  [facility] [place] Number:      Facility and Address:         Provider Number: [id number]  Attending Physician Name and Address:  Jhonny Calvin NOVAK, MD  Relative Name and Phone Number:       Current Level of Care: Hospital Recommended Level of Care: Skilled Nursing Facility Prior Approval Number:    Date Approved/Denied:   PASRR Number: [id number]  Discharge Plan: SNF    Current Diagnoses: Patient Active Problem List   Diagnosis Date Noted   ESRD needing dialysis (HCC) 05/28/2024   Acute hyperkalemia 01/30/2024   Thrombosis of renal dialysis arteriovenous graft 01/30/2024   Complication of vascular access for dialysis 09/20/2023   Obesity (BMI 30-39.9) 09/20/2023   Hyponatremia 09/20/2023   Thrombocytopenia 09/20/2023   Complication of vascular access for dialysis, sequela 09/20/2023   Hyperkalemia 09/19/2023   Arteriovenous fistula occlusion 09/19/2023   ESRD (end stage renal disease) (HCC) 03/13/2023   Dyslipidemia 03/13/2023   Type 2 diabetes mellitus with peripheral neuropathy (HCC) 03/13/2023   Abscess of left upper extremity 03/12/2023   Slurred speech 07/23/2022   Acute CVA (cerebrovascular accident) (HCC) 07/22/2022   Multiple open wounds of lower leg 06/17/2022   Wound infection 06/14/2022   Decubitus ulcer of heel, bilateral 06/13/2022   Hypercoagulable state (positive anticardiolipin, elevated homocysteine, factor V Leiden deficiency) 06/13/2022   Osteomyelitis of right leg (HCC) 06/12/2022   Non-healing wound of right lower extremity 06/12/2022   Cellulitis and abscess of right leg 06/12/2022   Chronic anticoagulation 06/12/2022   ESRD on hemodialysis (HCC) 12/27/2021   (HFpEF) heart failure with preserved ejection fraction (HCC) 01/31/2021   Activated protein C resistance  10/02/2020   Hereditary and idiopathic neuropathy, unspecified 10/02/2020   History of falling 10/02/2020   Insomnia, unspecified 10/02/2020   Muscle weakness (generalized) 10/02/2020   Resistance to multiple antibiotics 10/02/2020   Unspecified abnormalities of gait and mobility 10/02/2020   Unspecified lack of coordination 10/02/2020   Left-sided weakness 10/02/2020   Anemia of chronic renal failure 10/02/2020   Multiple drug resistant organism (MDRO) culture positive 09/25/2020   History of stroke 06/11/2020   Uncontrolled type 2 diabetes mellitus with hyperglycemia, with long-term current use of insulin  (HCC) 06/11/2020   Urinary tract infection 06/11/2020   Heel ulcer, left, limited to breakdown of skin (HCC) 01/04/2020   Leg ulcer, left, limited to breakdown of skin (HCC) 01/04/2020   Lymphedema, not elsewhere classified 01/04/2020   Chest pain, atypical 09/10/2019   Anemia of chronic disease 09/19/2018   Cellulitis of left lower extremity 09/19/2018   Sepsis (HCC) 09/19/2018   Anticardiolipin antibody positive 08/16/2018   Elevated homocysteine 08/16/2018   Heterozygous factor V Leiden mutation 08/16/2018   History of obstructive sleep apnea 07/18/2018   Ischemic stroke (HCC) 07/18/2018   Acute kidney failure, unspecified 07/02/2018   Acute kidney injury superimposed on CKD 06/29/2018   Encounter for other specified surgical aftercare 05/11/2018   Proteus (mirabilis) (morganii) as the cause of diseases classified elsewhere 05/11/2018   CKD (chronic kidney disease) stage 3, GFR 30-59 ml/min (HCC) 03/22/2018   Closed fracture of proximal end of left humerus 03/22/2018   Closed fracture of right tibial plateau with routine healing 03/22/2018   Hyperlipidemia with target LDL less than 70 07/14/2017   Morbid (severe) obesity due to  excess calories (HCC) 10/20/2016   Unspecified sequelae of cerebral infarction 10/20/2016   Unspecified diastolic (congestive) heart failure (HCC)  10/20/2016   Type 2 diabetes mellitus with hyperlipidemia (HCC) 10/20/2016   Primary osteoarthritis of both knees 05/21/2016   History of influenza 05/04/2016   Dyspnea on effort 03/12/2016   GERD (gastroesophageal reflux disease) 01/27/2016   Calculus of gallbladder without cholecystitis without obstruction 10/21/2015   Pseudophakia of left eye 10/07/2015   Pseudophakia of right eye 08/26/2015   Morbid obesity with BMI of 50.0-59.9, adult (HCC) 07/29/2015   Lymphedema of both lower extremities 07/08/2015   Hallux valgus, acquired 07/08/2015   Onychomycosis due to dermatophyte 07/08/2015   At high risk for falls 12/10/2014   Essential hypertension 12/10/2014   Hypertension associated with type 2 diabetes mellitus (HCC) 12/10/2014   Vitamin D deficiency 11/21/2014   Type II diabetes mellitus (HCC) 10/13/2012   Sarcoidosis 01/31/2012    Orientation RESPIRATION BLADDER Height & Weight     Self, Time, Situation, Place  Normal Continent Weight: 130.6 kg Height:  5' 4 (162.6 cm)  BEHAVIORAL SYMPTOMS/MOOD NEUROLOGICAL BOWEL NUTRITION STATUS      Continent Diet (Renal carb modified fluid restriction)  AMBULATORY STATUS COMMUNICATION OF NEEDS Skin   Extensive Assist Verbally Normal                       Personal Care Assistance Level of Assistance              Functional Limitations Info             SPECIAL CARE FACTORS FREQUENCY                       Contractures Contractures Info: Not present    Additional Factors Info  Code Status, Allergies Code Status Info: Full Allergies Info: Sulfamethoxazole-trimethoprim           Current Medications (05/30/2024):  This is the current hospital active medication list Current Facility-Administered Medications  Medication Dose Route Frequency Provider Last Rate Last Admin   acetaminophen  (TYLENOL ) tablet 650 mg  650 mg Oral Q6H PRN Dew, Jason S, MD       Or   acetaminophen  (TYLENOL ) suppository 650 mg  650 mg  Rectal Q6H PRN Dew, Jason S, MD       albuterol  (PROVENTIL ) (2.5 MG/3ML) 0.083% nebulizer solution 2.5 mg  2.5 mg Nebulization Q6H PRN Sreenath, Sudheer B, MD       apixaban  (ELIQUIS ) tablet 5 mg  5 mg Oral BID Dew, Jason S, MD   5 mg at 05/30/24 1309   atorvastatin  (LIPITOR ) tablet 80 mg  80 mg Oral Daily Dew, Jason S, MD   80 mg at 05/30/24 1309   calcitRIOL  (ROCALTROL ) capsule 0.25 mcg  0.25 mcg Oral Daily Dew, Jason S, MD   0.25 mcg at 05/30/24 1317   calcium  acetate (PHOSLO ) capsule 667 mg  667 mg Oral 2 times per day on Monday Wednesday Friday Marea Selinda RAMAN, MD       And   calcium  acetate (PHOSLO ) capsule 667 mg  667 mg Oral 3 times per day on Sunday Tuesday Thursday Saturday Marea Selinda RAMAN, MD   667 mg at 05/29/24 1747   calcium -vitamin D (OSCAL WITH D) 500-5 MG-MCG per tablet 1 tablet  1 tablet Oral Q T,Th,S,Su Dew, Jason S, MD   1 tablet at 05/29/24 9090   carvedilol  (COREG ) tablet 12.5 mg  12.5 mg  Oral BID Dew, Jason S, MD   12.5 mg at 05/30/24 1310   Chlorhexidine  Gluconate Cloth 2 % PADS 6 each  6 each Topical Q0600 Dew, Jason S, MD   6 each at 05/30/24 0525   diclofenac  Sodium (VOLTAREN ) 1 % topical gel 2 g  2 g Topical Q4H PRN Sreenath, Sudheer B, MD       ferrous sulfate  tablet 325 mg  325 mg Oral Daily Dew, Jason S, MD   325 mg at 05/30/24 1310   folic acid  (FOLVITE ) tablet 1 mg  1 mg Oral Daily Dew, Jason S, MD   1 mg at 05/30/24 1309   HYDROcodone -acetaminophen  (NORCO/VICODIN) 5-325 MG per tablet 1 tablet  1 tablet Oral Q6H PRN Sreenath, Sudheer B, MD   1 tablet at 05/30/24 1317   insulin  aspart (novoLOG ) injection 0-9 Units  0-9 Units Subcutaneous TID WC Dew, Jason S, MD   2 Units at 05/29/24 1235   latanoprost  (XALATAN ) 0.005 % ophthalmic solution 1 drop  1 drop Both Eyes QHS Dew, Jason S, MD   1 drop at 05/29/24 2222   lidocaine  (LIDODERM ) 5 % 1 patch  1 patch Transdermal Q24H Foust, Katy L, NP   1 patch at 05/30/24 0525   loratadine  (CLARITIN ) tablet 10 mg  10 mg Oral QODAY  Sreenath, Sudheer B, MD   10 mg at 05/30/24 1317   montelukast  (SINGULAIR ) tablet 10 mg  10 mg Oral QHS Dew, Jason S, MD   10 mg at 05/29/24 2222   multivitamin (PROSIGHT) tablet 1 tablet  1 tablet Oral BID Dew, Jason S, MD   1 tablet at 05/29/24 2222   ondansetron  (ZOFRAN ) injection 4 mg  4 mg Intravenous Q6H PRN Dew, Jason S, MD   4 mg at 05/29/24 1827   pantoprazole  (PROTONIX ) EC tablet 40 mg  40 mg Oral Daily Dew, Jason S, MD   40 mg at 05/30/24 1310   pregabalin  (LYRICA ) capsule 25 mg  25 mg Oral Daily Dew, Jason S, MD   25 mg at 05/30/24 1308   senna-docusate (Senokot-S) tablet 1 tablet  1 tablet Oral QHS PRN Dew, Jason S, MD       sodium chloride  flush (NS) 0.9 % injection 3 mL  3 mL Intravenous Q12H Marea Selinda RAMAN, MD   3 mL at 05/29/24 2222   torsemide  (DEMADEX ) tablet 10 mg  10 mg Oral Daily PRN Dew, Jason S, MD         Discharge Medications: Please see discharge summary for a list of discharge medications.  Relevant Imaging Results:  Relevant Lab Results:   Additional Information SSN [id number]  Corean ONEIDA Haddock, RN     "

## 2024-05-30 NOTE — Progress Notes (Signed)
 " PROGRESS NOTE    Jody Nelson  FMW:[id number] DOB: 04/24/1951 DOA: 05/28/2024 PCP: [place]    Brief Narrative:   Jody Nelson is a 74 y.o. year old female with medical history of hypertension, hyperlipidemia, type 2 diabetes, ESRD on HD MWF, HFpEF presenting from skilled nursing facility after missing HD due to problem with access.  States her fistula had clotted and she was unable to get dialysis on Friday.  She states he is not having any symptoms.  Denies any URI symptoms.  Denies any muscle aches or cramps.  Without any palpitations.  Seen in HD as patient was noted to have severe hyperkalemia on arrival and PermCath was placed.  Nephrology consulted after PermCath placement and initiated emergent dialysis.  Given need for admission, [facility] contacted for admission.      Assessment & Plan:   Principal Problem:   ESRD needing dialysis (HCC) Active Problems:   Acute hyperkalemia   (HFpEF) heart failure with preserved ejection fraction (HCC)   Type II diabetes mellitus (HCC)   GERD (gastroesophageal reflux disease)   Hyperlipidemia with target LDL less than 70   Essential hypertension   Hypertension associated with type 2 diabetes mellitus (HCC)   Sarcoidosis   Vitamin D deficiency   Morbid (severe) obesity due to excess calories (HCC)   # Severe hyperkalemia Secondary to missed HD sessions Plan: Potassium remains markedly elevated at 1/21.  Greater than 7.5.  HD today.  Recheck potassium 1500 today and then again tomorrow morning.    # ESRD On mwf hemodialysis outpt, clotted fistula, missed dialysis, presented hyperkalemic as above. Urgent temporary dialysis catheter placed in right groin on 1/19 and received dialysis Plan: PermCath placed in left chest by vascular on 1/20.  Dialyzed successfully on 1/21.  Temporary femoral dialysis catheter can be removed today.   # Debility Resides at [facility]   # Factor 5 leiden thrombophilia - continue home  apixaban    # History CVA - home apixaban , statin   # HFpEF Euvolemic - volume w/ dialysis and home torsemide  prn - cont home coreg    # HTN Normotensive - cont home coreg    # Chronic pain Continue home regimen   # T2DM Appears to be diet controlled - monitor daily fastings   # Class III obesity  Complicates overall care and prognosis   DVT prophylaxis: Apixaban  Code Status: Full Family Communication: None Disposition Plan: Status is: Inpatient Remains inpatient appropriate because: Severe hyperkalemia.  If potassium improved may be stable for discharge 1/22   Level of care: Telemetry  Consultants:  Nephrology  Procedures:  Temporary dialysis catheter Permanent dialysis catheter  Antimicrobials: None   Subjective: Seen and examined post HD.  Resting in bed.  No distress.  Complains of some chest wall pain near catheter insertion site  Objective: Vitals:   05/30/24 1130 05/30/24 1200 05/30/24 1230 05/30/24 1318  BP: 94/62 98/61 (!) 109/93 (!) 103/58  Pulse: 77 79 80 82  Resp: (!) 9 (!) 24 19 16   Temp:   97.6 F (36.4 C) 97.8 F (36.6 C)  TempSrc:   Oral Oral  SpO2: 97% 97% 98% 97%  Weight:   130.6 kg   Height:        Intake/Output Summary (Last 24 hours) at 05/30/2024 1325 Last data filed at 05/30/2024 1230 Gross per 24 hour  Intake --  Output 1500 ml  Net -1500 ml   Filed Weights   05/30/24 0423 05/30/24 0800 05/30/24 1230  Weight: 136 kg 132.1 kg 130.6 kg    Examination:  General exam: Appears calm and comfortable  Respiratory system: Mild bibasilar crackles.  Normal work of breathing.  Room air Cardiovascular system: S1 S2, RRR, no murmurs, no pedal edema Gastrointestinal system: Obese, soft, NT/ND, normal bowel sounds Central nervous system: Alert and oriented. No focal neurological deficits. Extremities: Decreased power bilateral lower extremities Skin: No rashes, lesions or ulcers Psychiatry: Judgement and insight appear normal.  Mood & affect appropriate.     Data Reviewed: I have personally reviewed following labs and imaging studies  CBC: Recent Labs  Lab 05/29/24 0434 05/30/24 0759  WBC 4.1 4.9  HGB 8.7* 9.7*  HCT 26.9* 31.2*  MCV 94.7 97.5  PLT 109* 124*   Basic Metabolic Panel: Recent Labs  Lab 05/28/24 1405 05/29/24 0018 05/29/24 0434 05/30/24 0619  NA  --  134* 134* 133*  K 7.0* 4.8 4.7 >7.5*  CL  --  98 98 98  CO2  --  27 28 23   GLUCOSE  --  163* 129* 104*  BUN  --  33* 35* 43*  CREATININE  --  7.93* 8.18* 9.34*  CALCIUM   --  9.2 9.1 9.6  MG 2.8*  --   --   --   PHOS  --  4.0  --   --    GFR: Estimated Creatinine Clearance: 7.4 mL/min (A) (by C-G formula based on SCr of 9.34 mg/dL (H)). Liver Function Tests: Recent Labs  Lab 05/29/24 0018  ALBUMIN  3.1*   No results for input(s): LIPASE, AMYLASE in the last 168 hours. No results for input(s): AMMONIA in the last 168 hours. Coagulation Profile: No results for input(s): INR, PROTIME in the last 168 hours. Cardiac Enzymes: No results for input(s): CKTOTAL, CKMB, CKMBINDEX, TROPONINI in the last 168 hours. BNP (last 3 results) No results for input(s): PROBNP in the last 8760 hours. HbA1C: Recent Labs    05/28/24 2235  HGBA1C 6.0*   CBG: Recent Labs  Lab 05/29/24 1225 05/29/24 1548 05/29/24 1712 05/29/24 2207 05/30/24 1300  GLUCAP 163* 104* 104* 90 96   Lipid Profile: No results for input(s): CHOL, HDL, LDLCALC, TRIG, CHOLHDL, LDLDIRECT in the last 72 hours. Thyroid  Function Tests: No results for input(s): TSH, T4TOTAL, FREET4, T3FREE, THYROIDAB in the last 72 hours. Anemia Panel: No results for input(s): VITAMINB12, FOLATE, FERRITIN, TIBC, IRON , RETICCTPCT in the last 72 hours. Sepsis Labs: No results for input(s): PROCALCITON, LATICACIDVEN in the last 168 hours.  No results found for this or any previous visit (from the past 240 hours).        Radiology Studies: PERIPHERAL VASCULAR CATHETERIZATION Result Date: 05/29/2024 See surgical note for result.  PERIPHERAL VASCULAR CATHETERIZATION Result Date: 05/28/2024 See surgical note for result.       Scheduled Meds:  apixaban   5 mg Oral BID   atorvastatin   80 mg Oral Daily   calcitRIOL   0.25 mcg Oral Daily   calcium  acetate  667 mg Oral 2 times per day on Monday Wednesday Friday   And   calcium  acetate  667 mg Oral 3 times per day on Sunday Tuesday Thursday Saturday   calcium -vitamin D  1 tablet Oral Q T,Th,S,Su   carvedilol   12.5 mg Oral BID   Chlorhexidine  Gluconate Cloth  6 each Topical Q0600   ferrous sulfate   325 mg Oral Daily   folic acid   1 mg Oral Daily   insulin  aspart  0-9 Units Subcutaneous TID WC  latanoprost   1 drop Both Eyes QHS   lidocaine   1 patch Transdermal Q24H   loratadine   10 mg Oral QODAY   montelukast   10 mg Oral QHS   multivitamin  1 tablet Oral BID   pantoprazole   40 mg Oral Daily   pregabalin   25 mg Oral Daily   sodium chloride  flush  3 mL Intravenous Q12H   Continuous Infusions:   LOS: 1 day     Calvin KATHEE Robson, MD [facility]   If 7PM-7AM, please contact night-coverage  05/30/2024, 1:25 PM   "

## 2024-05-30 NOTE — Progress Notes (Signed)
 " [facility]  ROUNDING NOTE   Subjective:   Jody Nelson is a 74 y.o. female with heart failure with preserved ejection fraction, end-stage renal disease, degenerative joint disease, depression, hypertension, sleep apnea, coronary disease, obesity, GERD, diabetes type 2, and history of stroke. Patient presents to vascular surgery for scheduled permcath and was found to have elevated potassium. Patient has been admitted under observation for End stage renal disease (HCC) [N18.6] ESRD needing dialysis (HCC) [N18.6, Z99.2]  Patient is known to our practice and receives outpatient dialysis treatments at [facility] on a MWF schedule, supervised by Dr. Dennise.   Patient seen and evaluated during dialysis   HEMODIALYSIS FLOWSHEET:  Blood Flow Rate (mL/min): 299 mL/min Arterial Pressure (mmHg): -224.64 mmHg Venous Pressure (mmHg): 189.89 mmHg TMP (mmHg): 17.37 mmHg Ultrafiltration Rate (mL/min): 0 mL/min Dialysate Flow Rate (mL/min): 300 ml/min  No complaints to offer Permcath functioning well during treatment  Objective:  Vital signs in last 24 hours:  Temp:  [97.6 F (36.4 C)-98.2 F (36.8 C)] 97.6 F (36.4 C) (01/21 0745) Pulse Rate:  [0-86] 74 (01/21 1000) Resp:  [10-33] 10 (01/21 1000) BP: (94-150)/(55-81) 99/60 (01/21 1000) SpO2:  [95 %-100 %] 97 % (01/21 1000) Weight:  [132.1 kg-136 kg] 132.1 kg (01/21 0800)  Weight change: 7.632 kg Filed Weights   05/29/24 0332 05/30/24 0423 05/30/24 0800  Weight: 127.3 kg 136 kg 132.1 kg    Intake/Output: I/O last 3 completed shifts: In: 350 [P.O.:300; IV Piggyback:50] Out: 1000 [Other:1000]   Intake/Output this shift:  No intake/output data recorded.  Physical Exam: General: NAD  Head: Normocephalic, atraumatic. Moist oral mucosal membranes  Eyes: Anicteric  Lungs:  Clear to auscultation, normal effort  Heart: Regular rate and rhythm  Abdomen:  Soft, nontender  Extremities:  1+ peripheral edema.   Neurologic: Awake, alert, conversant  Skin: Warm,dry, no rash  Access: Rt femoral HD temp cath, RT internal jugular permcath    Basic Metabolic Panel: Recent Labs  Lab 05/28/24 1405 05/29/24 0018 05/29/24 0434 05/30/24 0619  NA  --  134* 134* 133*  K 7.0* 4.8 4.7 >7.5*  CL  --  98 98 98  CO2  --  27 28 23   GLUCOSE  --  163* 129* 104*  BUN  --  33* 35* 43*  CREATININE  --  7.93* 8.18* 9.34*  CALCIUM   --  9.2 9.1 9.6  MG 2.8*  --   --   --   PHOS  --  4.0  --   --     Liver Function Tests: Recent Labs  Lab 05/29/24 0018  ALBUMIN  3.1*   No results for input(s): LIPASE, AMYLASE in the last 168 hours. No results for input(s): AMMONIA in the last 168 hours.  CBC: Recent Labs  Lab 05/29/24 0434 05/30/24 0759  WBC 4.1 4.9  HGB 8.7* 9.7*  HCT 26.9* 31.2*  MCV 94.7 97.5  PLT 109* 124*    Cardiac Enzymes: No results for input(s): CKTOTAL, CKMB, CKMBINDEX, TROPONINI in the last 168 hours.  BNP: Invalid input(s): POCBNP  CBG: Recent Labs  Lab 05/29/24 0855 05/29/24 1225 05/29/24 1548 05/29/24 1712 05/29/24 2207  GLUCAP 90 163* 104* 104* 90    Microbiology: Results for orders placed or performed during the hospital encounter of 01/30/24  MRSA Next Gen by PCR, Nasal     Status: None   Collection Time: 01/30/24  6:25 PM   Specimen: Nasal Mucosa; Nasal Swab  Result Value Ref Range  Status   MRSA by PCR Next Gen NOT DETECTED NOT DETECTED Final    Comment: (NOTE) The GeneXpert MRSA Assay (FDA approved for NASAL specimens only), is one component of a comprehensive MRSA colonization surveillance program. It is not intended to diagnose MRSA infection nor to guide or monitor treatment for MRSA infections. Test performance is not FDA approved in patients less than 11 years old. Performed at [facility], [address] [place], [place] [address]     Coagulation Studies: No results for input(s): LABPROT, INR in the last 72  hours.  Urinalysis: No results for input(s): COLORURINE, LABSPEC, PHURINE, GLUCOSEU, HGBUR, BILIRUBINUR, KETONESUR, PROTEINUR, UROBILINOGEN, NITRITE, LEUKOCYTESUR in the last 72 hours.  Invalid input(s): APPERANCEUR    Imaging: PERIPHERAL VASCULAR CATHETERIZATION Result Date: 05/29/2024 See surgical note for result.  PERIPHERAL VASCULAR CATHETERIZATION Result Date: 05/28/2024 See surgical note for result.    Medications:     apixaban   5 mg Oral BID   atorvastatin   80 mg Oral Daily   calcitRIOL   0.25 mcg Oral Daily   calcium  acetate  667 mg Oral 2 times per day on Monday Wednesday Friday   And   calcium  acetate  667 mg Oral 3 times per day on Sunday Tuesday Thursday Saturday   calcium -vitamin D  1 tablet Oral Q T,Th,S,Su   carvedilol   12.5 mg Oral BID   Chlorhexidine  Gluconate Cloth  6 each Topical Q0600   ferrous sulfate   325 mg Oral Daily   folic acid   1 mg Oral Daily   insulin  aspart  0-9 Units Subcutaneous TID WC   latanoprost   1 drop Both Eyes QHS   lidocaine   1 patch Transdermal Q24H   montelukast   10 mg Oral QHS   multivitamin  1 tablet Oral BID   pantoprazole   40 mg Oral Daily   pregabalin   25 mg Oral Daily   sodium chloride  flush  3 mL Intravenous Q12H   acetaminophen  **OR** acetaminophen , ondansetron  (ZOFRAN ) IV, senna-docusate, torsemide   Assessment/ Plan:  [name] is a 74 y.o.  female heart failure with preserved ejection fraction, end-stage renal disease, degenerative joint disease, depression, hypertension, sleep apnea, coronary disease, obesity, GERD, diabetes type 2, and history of stroke. Patient presents to vascular surgery for scheduled permcath and was found to have elevated potassium. Patient has been admitted under observation for End stage renal disease (HCC) [N18.6] ESRD needing dialysis (HCC) [N18.6, Z99.2]   Hyperkalemia with end stage renal disease. Last treatment received on Friday. Potassium remains elevated  today, greater than 7.5. Will dialyze today on 1K bath. Remains on renal diet. Next treatment scheduled for Friday.   2. Malfunctioning dialysis access.  Appreciate vascular placing right IJ PermCath on 1/20.  Functioning well during dialysis today.  3. Anemia of chronic kidney disease Lab Results  Component Value Date   HGB 9.7 (L) 05/30/2024    Hgb acceptable at 9.7. Patient receives Mircera at outpatient clinic.  No indication for ESA at this time.  4. Secondary Hyperparathyroidism: with outpatient labs: PTH 69, phosphorus 5.1, calcium  9.4 on 05/21/24.   Lab Results  Component Value Date   CALCIUM  9.6 05/30/2024   CAION 1.13 (L) 04/16/2022   PHOS 4.0 05/29/2024    Calcium  and phosphorus within desired range for renal patient.Continue calcitriol  and calcium  acetate as ordered.   LOS: 1 Jody Nelson 1/21/202610:15 AM   "

## 2024-05-31 DIAGNOSIS — Z992 Dependence on renal dialysis: Secondary | ICD-10-CM | POA: Diagnosis not present

## 2024-05-31 DIAGNOSIS — N186 End stage renal disease: Secondary | ICD-10-CM | POA: Diagnosis not present

## 2024-05-31 LAB — BASIC METABOLIC PANEL WITH GFR
Anion gap: 10 (ref 5–15)
BUN: 29 mg/dL — ABNORMAL HIGH (ref 8–23)
CO2: 25 mmol/L (ref 22–32)
Calcium: 8.3 mg/dL — ABNORMAL LOW (ref 8.9–10.3)
Chloride: 98 mmol/L (ref 98–111)
Creatinine, Ser: 7.22 mg/dL — ABNORMAL HIGH (ref 0.44–1.00)
GFR, Estimated: 6 mL/min — ABNORMAL LOW
Glucose, Bld: 104 mg/dL — ABNORMAL HIGH (ref 70–99)
Potassium: 5.1 mmol/L (ref 3.5–5.1)
Sodium: 133 mmol/L — ABNORMAL LOW (ref 135–145)

## 2024-05-31 LAB — GLUCOSE, CAPILLARY: Glucose-Capillary: 98 mg/dL (ref 70–99)

## 2024-05-31 MED ORDER — HYDROCODONE-ACETAMINOPHEN 5-325 MG PO TABS: 1.0000 | ORAL_TABLET | Freq: Four times a day (QID) | ORAL | 0 refills | Status: AC | PRN

## 2024-05-31 MED ORDER — PATIROMER SORBITEX CALCIUM 8.4 G PO PACK: 16.8000 g | PACK | Freq: Every day | ORAL | Status: AC

## 2024-05-31 NOTE — Plan of Care (Signed)

## 2024-05-31 NOTE — Progress Notes (Signed)
 D/C order noted. Contacted [facility] to advise of pt and d/c today. Requested documents faxed to clinic for continuation of care.  Suzen Satchel Dialysis Navigator [phone]

## 2024-05-31 NOTE — TOC Transition Note (Signed)
 Transition of Care [facility]) - Discharge Note   Patient Details  Name: Jody Nelson MRN: [id number] Date of Birth: Apr 08, 1951  [facility]) CM/SW Contact:  Corean ONEIDA Haddock, RN Phone Number: 05/31/2024, 10:19 AM   Clinical Narrative:      Patient will DC to: [facility] Anticipated DC date: 05/31/24  Family notified:Patient states she will notify her daughter Transport by: [facility] RN confirms she has place the signed script in the DC packet  Per MD patient ready for DC to . RN, patient, and facility notified of DC. Discharge Summary sent to facility. RN given number for report. DC packet on chart.   TOC signing off.        Patient Goals and CMS Choice            Discharge Placement                       Discharge Plan and Services Additional resources added to the After Visit Summary for                                       Social Drivers of Health (SDOH) Interventions SDOH Screenings   Food Insecurity: No Food Insecurity (05/28/2024)  Housing: Low Risk (05/28/2024)  Transportation Needs: No Transportation Needs (05/28/2024)  Utilities: Not At Risk (05/28/2024)  Financial Resource Strain: Low Risk  (07/07/2023)   Received from [facility]  Social Connections: Moderately Isolated (05/28/2024)  Tobacco Use: Medium Risk (04/19/2024)     Readmission Risk Interventions    05/30/2024    2:51 PM  Readmission Risk Prevention Plan  Transportation Screening Complete  PCP or Specialist Appt within 3-5 Days Complete  HRI or Home Care Consult Complete  Palliative Care Screening Not Applicable  Medication Review (RN Care Manager) Complete

## 2024-05-31 NOTE — Progress Notes (Signed)
 " [facility]  ROUNDING NOTE   Subjective:   Jody Nelson is a 74 y.o. female with heart failure with preserved ejection fraction, end-stage renal disease, degenerative joint disease, depression, hypertension, sleep apnea, coronary disease, obesity, GERD, diabetes type 2, and history of stroke. Patient presents to vascular surgery for scheduled permcath and was found to have elevated potassium. Patient has been admitted under observation for End stage renal disease (HCC) [N18.6] ESRD needing dialysis (HCC) [N18.6, Z99.2]  Patient is known to our practice and receives outpatient dialysis treatments at [facility] on a MWF schedule, supervised by Dr. Dennise.    Update: Patient seen laying in bed Fully alert and oriented Denies pain HD temp cath removed Room air  Objective:  Vital signs in last 24 hours:  Temp:  [97.6 F (36.4 C)-98.6 F (37 C)] 98.6 F (37 C) (01/22 0745) Pulse Rate:  [80-88] 88 (01/22 0745) Resp:  [14-19] 18 (01/22 0745) BP: (94-109)/(42-93) 99/42 (01/22 0745) SpO2:  [97 %-100 %] 100 % (01/22 0745) Weight:  [130.6 kg-136.5 kg] 136.5 kg (01/22 0253)  Weight change: -3.9 kg Filed Weights   05/30/24 0800 05/30/24 1230 05/31/24 0253  Weight: 132.1 kg 130.6 kg (!) 136.5 kg    Intake/Output: I/O last 3 completed shifts: In: 120 [P.O.:120] Out: 1500 [Other:1500]   Intake/Output this shift:  Total I/O In: 240 [P.O.:240] Out: -   Physical Exam: General: NAD  Head: Normocephalic, atraumatic. Moist oral mucosal membranes  Eyes: Anicteric  Lungs:  Clear to auscultation, normal effort  Heart: Regular rate and rhythm  Abdomen:  Soft, nontender  Extremities:  1+ peripheral edema.  Neurologic: Awake, alert, conversant  Skin: Warm,dry, no rash  Access: RT internal jugular permcath    Basic Metabolic Panel: Recent Labs  Lab 05/28/24 1405 05/28/24 1405 05/29/24 0018 05/29/24 0434 05/30/24 0619 05/30/24 1543 05/31/24 0452  NA  --    --  134* 134* 133*  --  133*  K 7.0*  --  4.8 4.7 >7.5* 4.9 5.1  CL  --   --  98 98 98  --  98  CO2  --   --  27 28 23   --  25  GLUCOSE  --   --  163* 129* 104*  --  104*  BUN  --   --  33* 35* 43*  --  29*  CREATININE  --   --  7.93* 8.18* 9.34*  --  7.22*  CALCIUM   --    < > 9.2 9.1 9.6  --  8.3*  MG 2.8*  --   --   --   --   --   --   PHOS  --   --  4.0  --   --   --   --    < > = values in this interval not displayed.    Liver Function Tests: Recent Labs  Lab 05/29/24 0018  ALBUMIN  3.1*   No results for input(s): LIPASE, AMYLASE in the last 168 hours. No results for input(s): AMMONIA in the last 168 hours.  CBC: Recent Labs  Lab 05/29/24 0434 05/30/24 0759  WBC 4.1 4.9  HGB 8.7* 9.7*  HCT 26.9* 31.2*  MCV 94.7 97.5  PLT 109* 124*    Cardiac Enzymes: No results for input(s): CKTOTAL, CKMB, CKMBINDEX, TROPONINI in the last 168 hours.  BNP: Invalid input(s): POCBNP  CBG: Recent Labs  Lab 05/29/24 2207 05/30/24 1300 05/30/24 1727 05/30/24 2109 05/31/24 9253  GLUCAP 90 96 137* 114* 98    Microbiology: Results for orders placed or performed during the hospital encounter of 01/30/24  MRSA Next Gen by PCR, Nasal     Status: None   Collection Time: 01/30/24  6:25 PM   Specimen: Nasal Mucosa; Nasal Swab  Result Value Ref Range Status   MRSA by PCR Next Gen NOT DETECTED NOT DETECTED Final    Comment: (NOTE) The GeneXpert MRSA Assay (FDA approved for NASAL specimens only), is one component of a comprehensive MRSA colonization surveillance program. It is not intended to diagnose MRSA infection nor to guide or monitor treatment for MRSA infections. Test performance is not FDA approved in patients less than 60 years old. Performed at [facility], [address] [place], [place] [address]     Coagulation Studies: No results for input(s): LABPROT, INR in the last 72 hours.  Urinalysis: No results for input(s): COLORURINE,  LABSPEC, PHURINE, GLUCOSEU, HGBUR, BILIRUBINUR, KETONESUR, PROTEINUR, UROBILINOGEN, NITRITE, LEUKOCYTESUR in the last 72 hours.  Invalid input(s): APPERANCEUR    Imaging: PERIPHERAL VASCULAR CATHETERIZATION Result Date: 05/29/2024 See surgical note for result.    Medications:     apixaban   5 mg Oral BID   atorvastatin   80 mg Oral Daily   calcitRIOL   0.25 mcg Oral Daily   calcium  acetate  667 mg Oral 2 times per day on Monday Wednesday Friday   And   calcium  acetate  667 mg Oral 3 times per day on Sunday Tuesday Thursday Saturday   calcium -vitamin D  1 tablet Oral Q T,Th,S,Su   carvedilol   12.5 mg Oral BID   Chlorhexidine  Gluconate Cloth  6 each Topical Q0600   ferrous sulfate   325 mg Oral Daily   folic acid   1 mg Oral Daily   insulin  aspart  0-9 Units Subcutaneous TID WC   latanoprost   1 drop Both Eyes QHS   lidocaine   1 patch Transdermal Q24H   loratadine   10 mg Oral QODAY   montelukast   10 mg Oral QHS   multivitamin  1 tablet Oral BID   pantoprazole   40 mg Oral Daily   patiromer   16.8 g Oral Daily   pregabalin   25 mg Oral Daily   sodium chloride  flush  3 mL Intravenous Q12H   acetaminophen  **OR** acetaminophen , albuterol , diclofenac  Sodium, HYDROcodone -acetaminophen , ondansetron  (ZOFRAN ) IV, senna-docusate, torsemide   Assessment/ Plan:  Jody Nelson is a 74 y.o.  female heart failure with preserved ejection fraction, end-stage renal disease, degenerative joint disease, depression, hypertension, sleep apnea, coronary disease, obesity, GERD, diabetes type 2, and history of stroke. Patient presents to vascular surgery for scheduled permcath and was found to have elevated potassium. Patient has been admitted under observation for End stage renal disease (HCC) [N18.6] ESRD needing dialysis (HCC) [N18.6, Z99.2]   Hyperkalemia with end stage renal disease. Last treatment received on Friday. Potassium corrected. Dialysis received yesterday, UF 1.5L  achieved. Next treatment scheduled for Friday.   2. Malfunctioning dialysis access.  Appreciate vascular placing right IJ PermCath on 1/20.    3. Anemia of chronic kidney disease Lab Results  Component Value Date   HGB 9.7 (L) 05/30/2024    Hgb acceptable at 9.7. Patient receives Mircera at outpatient clinic.  No indication for ESA at this time.  4. Secondary Hyperparathyroidism: with outpatient labs: PTH 69, phosphorus 5.1, calcium  9.4 on 05/21/24.   Lab Results  Component Value Date   CALCIUM  8.3 (L) 05/31/2024   CAION 1.13 (L) 04/16/2022  PHOS 4.0 05/29/2024    Calcium  and phosphorus within desired range for renal patient.Continue calcitriol  and calcium  acetate as ordered.   LOS: 2 Fred Franzen 1/22/202612:09 PM   "

## 2024-05-31 NOTE — Discharge Summary (Signed)
 Physician Discharge Summary  Jody Nelson FMW:[id number] DOB: February 13, 1951 DOA: 05/28/2024  PCP: [place]  Admit date: 05/28/2024 Discharge date: 05/31/2024  Admitted From: LTC Disposition:  LTC  Recommendations for Outpatient Follow-up:  Follow up with PCP in 1-2 weeks Follow up nephro as directed  Home Health:No  Equipment/Devices:None   Discharge Condition:Stable  CODE STATUS:FULL  Diet recommendation: Renal  Brief/Interim Summary:   Jody Nelson is a 74 y.o. year old female with medical history of hypertension, hyperlipidemia, type 2 diabetes, ESRD on HD MWF, HFpEF presenting from skilled nursing facility after missing HD due to problem with access.  States her fistula had clotted and she was unable to get dialysis on Friday.  She states he is not having any symptoms.  Denies any URI symptoms.  Denies any muscle aches or cramps.  Without any palpitations.  Seen in HD as patient was noted to have severe hyperkalemia on arrival and PermCath was placed.  Nephrology consulted after PermCath placement and initiated emergent dialysis.  Given need for admission, [facility] contacted for admission.      Discharge Diagnoses:  Principal Problem:   ESRD needing dialysis (HCC) Active Problems:   Acute hyperkalemia   (HFpEF) heart failure with preserved ejection fraction (HCC)   Type II diabetes mellitus (HCC)   GERD (gastroesophageal reflux disease)   Hyperlipidemia with target LDL less than 70   Essential hypertension   Hypertension associated with type 2 diabetes mellitus (HCC)   Sarcoidosis   Vitamin D deficiency   Morbid (severe) obesity due to excess calories (HCC)  # Severe hyperkalemia- RESOLVED Secondary to missed HD sessions Plan: DC back to LTC Start [place]  daily     # ESRD On mwf hemodialysis outpt, clotted fistula, missed dialysis, presented hyperkalemic as above. Urgent temporary dialysis catheter placed in right groin on 1/19 and received  dialysis Plan: PermCath placed in left chest by vascular on 1/20.  Dialyzed successfully on 1/21.  Temp cath removed.  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    # History CVA - home apixaban , statin   # HFpEF Euvolemic - volume w/ dialysis and home torsemide  prn - cont home coreg    # HTN Normotensive - cont home coreg    # Chronic pain Continue home regimen   # T2DM Appears to be diet controlled - monitor daily fastings   # Class III obesity  Complicates overall care and prognosis   Discharge Instructions  Discharge Instructions     Increase activity slowly   Complete by: As directed       Allergies as of 05/31/2024       Reactions   Sulfamethoxazole-trimethoprim    Other reaction(s): Kidney Disorder Other reaction(s): Kidney Disorder Hyperkalemia, AKI Hyperkalemia, AKI        Medication List     STOP taking these medications    ciclopirox 0.77 % cream Commonly known as: LOPROX   fluticasone  50 MCG/ACT nasal spray Commonly known as: FLONASE    insulin  glargine 100 UNIT/ML Solostar Pen Commonly known as: LANTUS        TAKE these medications    acetaminophen  325 MG tablet Commonly known as: TYLENOL  Take 2 tablets by mouth every 8 (eight) hours as needed.   albuterol  108 (90 Base) MCG/ACT inhaler Commonly known as: VENTOLIN  HFA Inhale 1-2 puffs into the lungs every 4 (four) hours as needed for wheezing  or shortness of breath.   albuterol  (2.5 MG/3ML) 0.083% nebulizer solution Commonly known as: PROVENTIL  Take 2.5 mg by nebulization every 6 (six) hours as needed for wheezing or shortness of breath.   apixaban  5 MG Tabs tablet Commonly known as: ELIQUIS  Take 1 tablet (5 mg total) by mouth 2 (two) times daily.   atorvastatin  80 MG tablet Commonly known as: LIPITOR  Take 80 mg by mouth daily.   calcitRIOL  0.25 MCG  capsule Commonly known as: ROCALTROL  Take 0.25 mcg by mouth daily.   Calcium  600+D 600-10 MG-MCG Tabs Generic drug: Calcium  Carb-Cholecalciferol  Take 1 tablet by mouth 4 (four) times a week. 1 tablet, oral, Once A Day on Sun, Tue, Thu, Sat   calcium  acetate 667 MG capsule Commonly known as: PHOSLO  Take one capsule by mouth twice daily with food on Mondays, Wednesdays, and Fridays. Take one capsule 3 times daily on Tuesdays, Thursdays, Saturdays, and Sundays.   Calmoseptine 0.44-20.6 % Oint Generic drug: Menthol-Zinc Oxide Apply 1 Application topically 4 (four) times daily as needed.   carvedilol  12.5 MG tablet Commonly known as: COREG  Take 12.5 mg by mouth 2 (two) times daily.   diclofenac  Sodium 1 % Gel Commonly known as: VOLTAREN  Apply 2 g topically every 4 (four) hours as needed (pain). Apply bilateral hips   ferrous sulfate  324 (65 Fe) MG Tbec Take 1 tablet by mouth daily.   folic acid  1 MG tablet Commonly known as: FOLVITE  Take 1 mg by mouth daily.   GoodSense Hemorrhoidal 0.25-88.44 % suppository Generic drug: shark liver oil-cocoa butter Place 1 suppository rectally 2 (two) times daily as needed for hemorrhoids.   Healthy Eyes Supervision 2 Caps Take 1 capsule by mouth 2 (two) times daily.   HYDROcodone -acetaminophen  5-325 MG tablet Commonly known as: NORCO/VICODIN Take 1 tablet by mouth every 6 (six) hours as needed for severe pain (pain score 7-10). What changed: reasons to take this   insulin  lispro 100 UNIT/ML injection Commonly known as: HUMALOG Inject 2-12 Units into the skin in the morning, at noon, and at bedtime. Per sliding scale   latanoprost  0.005 % ophthalmic solution Commonly known as: XALATAN  Place 1 drop into both eyes at bedtime.   lidocaine -prilocaine  cream Commonly known as: EMLA  Apply 1 application  topically 3 (three) times a week. On mon, wed, and friday   loratadine  10 MG tablet Commonly known as: CLARITIN  Take 10 mg by mouth  every other day.   montelukast  10 MG tablet Commonly known as: SINGULAIR  Take 10 mg by mouth daily.   Mounjaro 2.5 MG/0.5ML Pen Generic drug: tirzepatide Inject 2.5 mg into the skin once a week. Once A Day on Fri   omeprazole 10 MG capsule Commonly known as: PRILOSEC Take 10 mg by mouth daily.   patiromer  8.4 g packet Commonly known as: VELTASSA  Take 2 packets (16.8 g total) by mouth daily. Start taking on: June 01, 2024   polyethylene glycol 17 g packet Commonly known as: MIRALAX  / GLYCOLAX  Take 17 g by mouth daily.   pregabalin  25 MG capsule Commonly known as: LYRICA  Take 1 capsule (25 mg total) by mouth daily.   senna-docusate 8.6-50 MG tablet Commonly known as: Senokot-S Take 2 tablets by mouth at bedtime as needed for mild constipation.   torsemide  10 MG tablet Commonly known as: DEMADEX  Take 1 tablet (10 mg total) by mouth daily as needed. 1 tablet (10 mg total) by mouth daily as needed for up to 3 days for increased leg swelling, shortness of breath,  weight gain 5+ lbs over 1-2 days. Seek medical care if these symptoms are not improving        Allergies[1]  Consultations: Nephrology Vascular sx   Procedures/Studies: PERIPHERAL VASCULAR CATHETERIZATION Result Date: 05/29/2024 See surgical note for result.  PERIPHERAL VASCULAR CATHETERIZATION Result Date: 05/28/2024 See surgical note for result.     Subjective: Seen and examined on day of dc.  Stable, appropriate for return to LTC  Discharge Exam: Vitals:   05/31/24 0253 05/31/24 0745  BP: (!) 107/46 (!) 99/42  Pulse: 88 88  Resp: 18 18  Temp: 98.6 F (37 C) 98.6 F (37 C)  SpO2: 100% 100%   Vitals:   05/30/24 1712 05/30/24 2010 05/31/24 0253 05/31/24 0745  BP: (!) 94/58 (!) 108/59 (!) 107/46 (!) 99/42  Pulse: 88 85 88 88  Resp: 14 19 18 18   Temp: 97.8 F (36.6 C) 97.9 F (36.6 C) 98.6 F (37 C) 98.6 F (37 C)  TempSrc:      SpO2: 98% 97% 100% 100%  Weight:   (!) 136.5 kg    Height:        General: Pt is alert, awake, not in acute distress Cardiovascular: RRR, S1/S2 +, no rubs, no gallops Respiratory: CTA bilaterally, no wheezing, no rhonchi Abdominal: Soft, NT, ND, bowel sounds + Extremities: no edema, no cyanosis    The results of significant diagnostics from this hospitalization (including imaging, microbiology, ancillary and laboratory) are listed below for reference.     Microbiology: No results found for this or any previous visit (from the past 240 hours).   Labs: BNP (last 3 results) No results for input(s): BNP in the last 8760 hours. Basic Metabolic Panel: Recent Labs  Lab 05/28/24 1405 05/29/24 0018 05/29/24 0434 05/30/24 0619 05/30/24 1543 05/31/24 0452  NA  --  134* 134* 133*  --  133*  K 7.0* 4.8 4.7 >7.5* 4.9 5.1  CL  --  98 98 98  --  98  CO2  --  27 28 23   --  25  GLUCOSE  --  163* 129* 104*  --  104*  BUN  --  33* 35* 43*  --  29*  CREATININE  --  7.93* 8.18* 9.34*  --  7.22*  CALCIUM   --  9.2 9.1 9.6  --  8.3*  MG 2.8*  --   --   --   --   --   PHOS  --  4.0  --   --   --   --    Liver Function Tests: Recent Labs  Lab 05/29/24 0018  ALBUMIN  3.1*   No results for input(s): LIPASE, AMYLASE in the last 168 hours. No results for input(s): AMMONIA in the last 168 hours. CBC: Recent Labs  Lab 05/29/24 0434 05/30/24 0759  WBC 4.1 4.9  HGB 8.7* 9.7*  HCT 26.9* 31.2*  MCV 94.7 97.5  PLT 109* 124*   Cardiac Enzymes: No results for input(s): CKTOTAL, CKMB, CKMBINDEX, TROPONINI in the last 168 hours. BNP: Invalid input(s): POCBNP CBG: Recent Labs  Lab 05/29/24 2207 05/30/24 1300 05/30/24 1727 05/30/24 2109 05/31/24 0746  GLUCAP 90 96 137* 114* 98   D-Dimer No results for input(s): DDIMER in the last 72 hours. Hgb A1c Recent Labs    05/28/24 2235  HGBA1C 6.0*   Lipid Profile No results for input(s): CHOL, HDL, LDLCALC, TRIG, CHOLHDL, LDLDIRECT in the last 72  hours. Thyroid  function studies No results for input(s): TSH, T4TOTAL, T3FREE,  THYROIDAB in the last 72 hours.  Invalid input(s): FREET3 Anemia work up No results for input(s): VITAMINB12, FOLATE, FERRITIN, TIBC, IRON , RETICCTPCT in the last 72 hours. Urinalysis No results found for: COLORURINE, APPEARANCEUR, LABSPEC, PHURINE, GLUCOSEU, HGBUR, BILIRUBINUR, KETONESUR, PROTEINUR, UROBILINOGEN, NITRITE, LEUKOCYTESUR Sepsis Labs Recent Labs  Lab 05/29/24 0434 05/30/24 0759  WBC 4.1 4.9   Microbiology No results found for this or any previous visit (from the past 240 hours).   Time coordinating discharge: 40 min   SIGNED:   Calvin KATHEE Robson, MD  [facility] 05/31/2024, 9:12 AM Pager   If 7PM-7AM, please contact night-coverage     [1]  Allergies Allergen Reactions   Sulfamethoxazole-Trimethoprim     Other reaction(s): Kidney Disorder Other reaction(s): Kidney Disorder Hyperkalemia, AKI Hyperkalemia, AKI

## 2024-07-19 ENCOUNTER — Ambulatory Visit (INDEPENDENT_AMBULATORY_CARE_PROVIDER_SITE_OTHER): Admitting: Vascular Surgery

## 2024-07-19 ENCOUNTER — Encounter (INDEPENDENT_AMBULATORY_CARE_PROVIDER_SITE_OTHER)
